# Patient Record
Sex: Male | Born: 1955 | Race: White | Hispanic: No | Marital: Single | Smoking: Current every day smoker
Health system: Southern US, Community
[De-identification: ages and names within clinical notes are randomized; demographics above are authoritative.]

## PROBLEM LIST (undated history)

## (undated) DIAGNOSIS — I10 Essential (primary) hypertension: Secondary | ICD-10-CM

## (undated) DIAGNOSIS — E871 Hypo-osmolality and hyponatremia: Secondary | ICD-10-CM

## (undated) DIAGNOSIS — I480 Paroxysmal atrial fibrillation: Secondary | ICD-10-CM

## (undated) HISTORY — PX: EYE SURGERY: SHX253

---

## 2007-04-20 ENCOUNTER — Emergency Department: Payer: Self-pay | Admitting: Emergency Medicine

## 2007-11-23 ENCOUNTER — Emergency Department: Payer: Self-pay | Admitting: Emergency Medicine

## 2010-06-24 ENCOUNTER — Ambulatory Visit: Payer: Self-pay | Admitting: "Endocrinology

## 2012-01-10 ENCOUNTER — Emergency Department: Payer: Self-pay | Admitting: Emergency Medicine

## 2012-01-10 LAB — TSH: Thyroid Stimulating Horm: 0.496 u[IU]/mL

## 2012-01-10 LAB — URINALYSIS, COMPLETE
Bacteria: NONE SEEN
Bilirubin,UR: NEGATIVE
Blood: NEGATIVE
Glucose,UR: NEGATIVE mg/dL (ref 0–75)
Ketone: NEGATIVE
Leukocyte Esterase: NEGATIVE
Nitrite: NEGATIVE
Ph: 7 (ref 4.5–8.0)
Protein: NEGATIVE
RBC,UR: NONE SEEN /HPF (ref 0–5)
Specific Gravity: 1.008 (ref 1.003–1.030)

## 2012-01-10 LAB — COMPREHENSIVE METABOLIC PANEL
Albumin: 3.8 g/dL (ref 3.4–5.0)
Alkaline Phosphatase: 98 U/L (ref 50–136)
Calcium, Total: 9.1 mg/dL (ref 8.5–10.1)
Co2: 28 mmol/L (ref 21–32)
Creatinine: 0.63 mg/dL (ref 0.60–1.30)
EGFR (Non-African Amer.): >60
Glucose: 91 mg/dL (ref 65–99)
Potassium: 4.3 mmol/L (ref 3.5–5.1)
SGPT (ALT): 65 U/L (ref 12–78)

## 2012-01-10 LAB — CBC
HCT: 41.1 % (ref 40.0–52.0)
HGB: 14.2 g/dL (ref 13.0–18.0)
MCHC: 34.6 g/dL (ref 32.0–36.0)
RBC: 4.25 10*6/uL — ABNORMAL LOW (ref 4.40–5.90)
WBC: 6.7 10*3/uL (ref 3.8–10.6)

## 2012-01-10 LAB — DRUG SCREEN, URINE
Barbiturates, Ur Screen: NEGATIVE (ref ?–200)
Cannabinoid 50 Ng, Ur ~~LOC~~: NEGATIVE (ref ?–50)
Cocaine Metabolite,Ur ~~LOC~~: NEGATIVE (ref ?–300)
Phencyclidine (PCP) Ur S: NEGATIVE (ref ?–25)

## 2012-01-10 LAB — ETHANOL
Ethanol %: 0.072 % (ref 0.000–0.080)
Ethanol: 72 mg/dL

## 2013-03-24 ENCOUNTER — Emergency Department: Payer: Self-pay | Admitting: Emergency Medicine

## 2013-03-24 LAB — CBC
HCT: 45.9 % (ref 40.0–52.0)
HGB: 14.9 g/dL (ref 13.0–18.0)
MCHC: 32.5 g/dL (ref 32.0–36.0)
MCV: 91 fL (ref 80–100)
Platelet: 243 10*3/uL (ref 150–440)
RBC: 5.06 10*6/uL (ref 4.40–5.90)
RDW: 13.8 % (ref 11.5–14.5)
WBC: 11.7 10*3/uL — ABNORMAL HIGH (ref 3.8–10.6)

## 2013-03-24 LAB — TSH: Thyroid Stimulating Horm: 0.76 u[IU]/mL

## 2013-03-24 LAB — DRUG SCREEN, URINE
Amphetamines, Ur Screen: NEGATIVE (ref ?–1000)
Benzodiazepine, Ur Scrn: NEGATIVE (ref ?–200)
Cannabinoid 50 Ng, Ur ~~LOC~~: NEGATIVE (ref ?–50)
Cocaine Metabolite,Ur ~~LOC~~: NEGATIVE (ref ?–300)
Tricyclic, Ur Screen: NEGATIVE (ref ?–1000)

## 2013-03-24 LAB — COMPREHENSIVE METABOLIC PANEL
Albumin: 3.6 g/dL (ref 3.4–5.0)
Anion Gap: 8 (ref 7–16)
Bilirubin,Total: 0.2 mg/dL (ref 0.2–1.0)
Chloride: 105 mmol/L (ref 98–107)
Co2: 23 mmol/L (ref 21–32)
Creatinine: 0.75 mg/dL (ref 0.60–1.30)
EGFR (African American): >60
Glucose: 109 mg/dL — ABNORMAL HIGH (ref 65–99)
Osmolality: 273 (ref 275–301)
Potassium: 4.1 mmol/L (ref 3.5–5.1)
SGOT(AST): 26 U/L (ref 15–37)
SGPT (ALT): 19 U/L (ref 12–78)
Total Protein: 8.2 g/dL (ref 6.4–8.2)

## 2013-03-24 LAB — SALICYLATE LEVEL: Salicylates, Serum: 2.5 mg/dL

## 2013-03-24 LAB — ACETAMINOPHEN LEVEL: Acetaminophen: <2 ug/mL

## 2015-04-01 ENCOUNTER — Encounter: Payer: Self-pay | Admitting: Emergency Medicine

## 2015-04-01 ENCOUNTER — Emergency Department
Admission: EM | Admit: 2015-04-01 | Discharge: 2015-04-01 | Disposition: A | Discharge status: Home / Self Care | Payer: Medicaid Other | Attending: Emergency Medicine | Admitting: Emergency Medicine

## 2015-04-01 DIAGNOSIS — S90561A Insect bite (nonvenomous), right ankle, initial encounter: Secondary | ICD-10-CM | POA: Diagnosis not present

## 2015-04-01 DIAGNOSIS — S40862A Insect bite (nonvenomous) of left upper arm, initial encounter: Secondary | ICD-10-CM | POA: Diagnosis not present

## 2015-04-01 DIAGNOSIS — W57XXXA Bitten or stung by nonvenomous insect and other nonvenomous arthropods, initial encounter: Secondary | ICD-10-CM | POA: Diagnosis not present

## 2015-04-01 DIAGNOSIS — R21 Rash and other nonspecific skin eruption: Secondary | ICD-10-CM | POA: Insufficient documentation

## 2015-04-01 DIAGNOSIS — Y9289 Other specified places as the place of occurrence of the external cause: Secondary | ICD-10-CM | POA: Insufficient documentation

## 2015-04-01 DIAGNOSIS — F17210 Nicotine dependence, cigarettes, uncomplicated: Secondary | ICD-10-CM | POA: Diagnosis not present

## 2015-04-01 DIAGNOSIS — Y9389 Activity, other specified: Secondary | ICD-10-CM | POA: Diagnosis not present

## 2015-04-01 DIAGNOSIS — S80862A Insect bite (nonvenomous), left lower leg, initial encounter: Secondary | ICD-10-CM | POA: Insufficient documentation

## 2015-04-01 DIAGNOSIS — S20369A Insect bite (nonvenomous) of unspecified front wall of thorax, initial encounter: Secondary | ICD-10-CM | POA: Insufficient documentation

## 2015-04-01 DIAGNOSIS — Y998 Other external cause status: Secondary | ICD-10-CM | POA: Insufficient documentation

## 2015-04-01 DIAGNOSIS — S80861A Insect bite (nonvenomous), right lower leg, initial encounter: Secondary | ICD-10-CM | POA: Insufficient documentation

## 2015-04-01 DIAGNOSIS — S90562A Insect bite (nonvenomous), left ankle, initial encounter: Secondary | ICD-10-CM | POA: Insufficient documentation

## 2015-04-01 MED ORDER — HYDROXYZINE HCL 25 MG PO TABS: 25.0000 mg | ORAL_TABLET | Freq: Three times a day (TID) | ORAL | Status: DC | PRN

## 2015-04-01 MED ORDER — PREDNISONE 10 MG PO TABS: ORAL_TABLET | ORAL | Status: DC

## 2015-04-01 NOTE — ED Provider Notes (Signed)
[facility] Emergency Department Provider Note ____________________________________________  Time seen: Approximately 11:37 AM  I have reviewed the triage vital signs and the nursing notes.   HISTORY  Chief Complaint Rash   HPI Barry Fowler is a 60 y.o. male is here with complaint of rash that began approximately 2 weeks ago. Patient states that rash is on his arms, chest, going and around his ankles. Patient states that he knows someone who has bedbugs has also been noticing bugs in his apartment that he is spraying with bug spray. Rash isitching. Patient is not taking any over-the-counter medicines for this. While sitting on stretcher there was a bug that was captured by the nurse and identified as a bedbug.   History reviewed. No pertinent past medical history.  There are no active problems to display for this patient.   History reviewed. No pertinent past surgical history.  Current Outpatient Rx  Name  Route  Sig  Dispense  Refill  . hydrOXYzine (ATARAX/VISTARIL) 25 MG tablet   Oral   Take 1 tablet (25 mg total) by mouth 3 (three) times daily as needed.   30 tablet   0   . predniSONE (DELTASONE) 10 MG tablet      Take 6 tablets  today, on day 2 take 5 tablets, day 3 take 4 tablets, day 4 take 3 tablets, day 5 take  2 tablets and 1 tablet the last day   21 tablet   0     Allergies Review of patient's allergies indicates no known allergies.  No family history on file.  Social History Social History  Substance Use Topics  . Smoking status: Current Every Day Smoker -- 1.00 packs/day    Types: Cigarettes  . Smokeless tobacco: None  . Alcohol Use: Yes     Comment: beer daily    Review of Systems Constitutional: No fever/chills ENT: No sore throat. Cardiovascular: Denies chest pain. Respiratory: Denies shortness of breath. Gastrointestinal: No abdominal pain.  No nausea, no vomiting.   Musculoskeletal: Negative for back pain. Skin:  Positive for rash. Neurological: Negative for headaches, focal weakness or numbness.  10-point ROS otherwise negative.  ____________________________________________   PHYSICAL EXAM:  VITAL SIGNS: ED Triage Vitals  Enc Vitals Group     BP 04/01/15 1120 132/90 mmHg     Pulse Rate 04/01/15 1120 106     Resp 04/01/15 1120 18     Temp 04/01/15 1120 98 F (36.7 C)     Temp Source 04/01/15 1120 Oral     SpO2 04/01/15 1120 94 %     Weight 04/01/15 1120 152 lb (68.947 kg)     Height 04/01/15 1120 6' (1.829 m)     Head Cir --      Peak Flow --      Pain Score --      Pain Loc --      Pain Edu? --      Excl. in GC? --     Constitutional: Alert and oriented. Well appearing and in no acute distress. Eyes: Conjunctivae are normal. PERRL. EOMI. Head: Atraumatic. Nose: No congestion/rhinnorhea. Neck: No stridor.   Cardiovascular: Normal rate, regular rhythm. Grossly normal heart sounds.  Good peripheral circulation. Respiratory: Normal respiratory effort.  No retractions. Lungs CTAB. Gastrointestinal: Soft and nontender. No distention.  Musculoskeletal: Moves upper and lower extremities without any difficulty. Normal gait was noted. Neurologic:  Normal speech and language. No gross focal neurologic deficits are appreciated. No gait instability. Skin:  Skin is warm, dry and intact. Individual red minute papules diffuse over the arms, chest and lower extremities. No pattern distribution. No weeping or warmth. Psychiatric: Mood and affect are normal. Speech and behavior are normal.  ____________________________________________   LABS (all labs ordered are listed, but only abnormal results are displayed)  Labs Reviewed - No data to display    PROCEDURES  Procedure(s) performed: None  Critical Care performed: No  ____________________________________________   INITIAL IMPRESSION / ASSESSMENT AND PLAN / ED COURSE  Pertinent labs & imaging results that were available during my  care of the patient were reviewed by me and considered in my medical decision making (see chart for details).  Patient was informed this most likely could be a bedbug and was prescribed Atarax 25 mg 3 times a day for itching and prednisone 60 mg 6 day taper. Patient is follow-up with [facility] if any continued problems. ____________________________________________   FINAL CLINICAL IMPRESSION(S) / ED DIAGNOSES  Final diagnoses:  Bedbug bite  Rash and nonspecific skin eruption      Tommi Rumps, PA-C 04/01/15 1323  Jeanmarie Plant, MD 04/01/15 1444

## 2015-04-01 NOTE — ED Notes (Signed)
Pt c/o rash that began 2 weeks ago.  Rash is across pts arm, chest and groin area.  This RN found insect on pts body.  Insect capture and shown to PA.  Charge RN informed.

## 2015-04-01 NOTE — ED Notes (Signed)
Rash with itching to arms, ankles, and stomach for about 2 weeks now.  Denies known allergies.

## 2015-04-01 NOTE — Discharge Instructions (Signed)
Follow-up with Dr. Gwen PoundsKowalski if any continued problems. Atarax for itching and begin prednisone Dosepak today. Call professional exterminator to get rid of your bedbugs.

## 2015-05-04 ENCOUNTER — Encounter: Payer: Self-pay | Admitting: Emergency Medicine

## 2015-05-04 ENCOUNTER — Emergency Department
Admission: EM | Admit: 2015-05-04 | Discharge: 2015-05-04 | Disposition: A | Discharge status: Home / Self Care | Payer: Medicaid Other | Attending: Emergency Medicine | Admitting: Emergency Medicine

## 2015-05-04 DIAGNOSIS — R21 Rash and other nonspecific skin eruption: Secondary | ICD-10-CM | POA: Diagnosis present

## 2015-05-04 DIAGNOSIS — B86 Scabies: Secondary | ICD-10-CM | POA: Diagnosis not present

## 2015-05-04 DIAGNOSIS — F17210 Nicotine dependence, cigarettes, uncomplicated: Secondary | ICD-10-CM | POA: Diagnosis not present

## 2015-05-04 MED ORDER — PERMETHRIN 5 % EX CREA: TOPICAL_CREAM | CUTANEOUS | Status: DC

## 2015-05-04 NOTE — ED Notes (Signed)
Pt verbalizes understanding of Discharge instructions  

## 2015-05-04 NOTE — Discharge Instructions (Signed)

## 2015-05-04 NOTE — ED Notes (Signed)
Pt presents to the ER from home with complaints of rash to skin, reports about 2 weeks he was diagnosed with bed bugs, reports rash persists needs medication, pt reports he tried to do extermination at home by himself. Pt talks in complete sentences reports he drank this morning couple of 40's(beer)pt's friend brought pt to ER

## 2015-05-04 NOTE — ED Provider Notes (Signed)
CSN: [id number]     Arrival date & time 05/04/15  1420 History   First MD Initiated Contact with Patient 05/04/15 1444     Chief Complaint  Patient presents with  . Rash     (Consider location/radiation/quality/duration/timing/severity/associated sxs/prior Treatment) HPI  60 year old male presents to the emergency department for evaluation of skin rash. He has a rash on his hands, forearms, abdomen and back. Patient recently moved into an apartment. Was diagnosed with bedbugs couple weeks ago. Patient attempted extermination by himself in his apartment. Patient describes small bumps on his hands in between the digits. No chest pain, shortness of breath, wheezing. No headaches or fevers. He is not taking anything for the rash.   History reviewed. No pertinent past medical history. History reviewed. No pertinent past surgical history. No family history on file. Social History  Substance Use Topics  . Smoking status: Current Every Day Smoker -- 1.00 packs/day    Types: Cigarettes  . Smokeless tobacco: None  . Alcohol Use: Yes     Comment: beer daily    Review of Systems  Constitutional: Negative.  Negative for fever, chills, activity change and appetite change.  HENT: Negative for congestion, ear pain, mouth sores, rhinorrhea, sinus pressure, sore throat and trouble swallowing.   Eyes: Negative for photophobia, pain and discharge.  Respiratory: Negative for cough, chest tightness and shortness of breath.   Cardiovascular: Negative for chest pain and leg swelling.  Gastrointestinal: Negative for nausea, vomiting, abdominal pain, diarrhea and abdominal distention.  Genitourinary: Negative for dysuria and difficulty urinating.  Musculoskeletal: Negative for back pain, arthralgias and gait problem.  Skin: Positive for rash. Negative for color change.  Neurological: Negative for dizziness and headaches.  Hematological: Negative for adenopathy.  Psychiatric/Behavioral: Negative for  behavioral problems and agitation.      Allergies  Review of patient's allergies indicates no known allergies.  Home Medications   Prior to Admission medications   Medication Sig Start Date End Date Taking? Authorizing Provider  hydrOXYzine (ATARAX/VISTARIL) 25 MG tablet Take 1 tablet (25 mg total) by mouth 3 (three) times daily as needed. 04/01/15   Tommi Rumps, PA-C  permethrin (ELIMITE) 5 % cream Apply from chin down to the feet, weight 10 hours, rinse. 05/04/15 05/03/16  Evon Slack, PA-C  predniSONE (DELTASONE) 10 MG tablet Take 6 tablets  today, on day 2 take 5 tablets, day 3 take 4 tablets, day 4 take 3 tablets, day 5 take  2 tablets and 1 tablet the last day 04/01/15   Tommi Rumps, PA-C   BP 90/60 mmHg  Pulse 118  Temp(Src) 97.9 F (36.6 C) (Oral)  Resp 2  Ht 6' (1.829 m)  Wt 70.308 kg  BMI 21.02 kg/m2  SpO2 98% Physical Exam  Constitutional: He is oriented to person, place, and time. He appears well-developed and well-nourished.  HENT:  Head: Normocephalic and atraumatic.  Eyes: Conjunctivae and EOM are normal. Pupils are equal, round, and reactive to light.  Neck: Normal range of motion. Neck supple.  Cardiovascular: Normal rate, regular rhythm, normal heart sounds and intact distal pulses.   Pulmonary/Chest: Effort normal and breath sounds normal. No respiratory distress. He has no wheezes. He has no rales. He exhibits no tenderness.  Abdominal: Soft. Bowel sounds are normal. He exhibits no distension. There is no tenderness.  Musculoskeletal: Normal range of motion. He exhibits no edema or tenderness.  Neurological: He is alert and oriented to person, place, and time.  Skin: Skin is  warm and dry.  Diffuse small papules along the dorsum of the hands, interdigital areas of the digits, forearms abdomen and back. There are small areas of burrowing along the papules with a centralized black dot. There is no vesicular lesions. Minimal surrounding erythema. No  fluctuance.  Psychiatric: He has a normal mood and affect. His behavior is normal. Judgment and thought content normal.    ED Course  Procedures (including critical care time) Labs Review Labs Reviewed - No data to display  Imaging Review No results found. I have personally reviewed and evaluated these images and lab results as part of my medical decision-making.   EKG Interpretation None      MDM   Final diagnoses:  Scabies   60 year old male with scabies. He is given a prescription for permethrin. Educated on how to use permethrin as well as to eradicate scabies from his home. Patient will continue Benadryl as needed for itching, he understands that he will its for several weeks after using permethrin cream.    Evon Slack, PA-C 05/04/15 1458  Jene Every, MD 05/04/15 [id number]

## 2015-05-12 ENCOUNTER — Emergency Department
Admission: EM | Admit: 2015-05-12 | Discharge: 2015-05-12 | Disposition: A | Discharge status: Home / Self Care | Payer: Medicaid Other | Attending: Emergency Medicine | Admitting: Emergency Medicine

## 2015-05-12 ENCOUNTER — Encounter: Payer: Self-pay | Admitting: *Deleted

## 2015-05-12 DIAGNOSIS — F17210 Nicotine dependence, cigarettes, uncomplicated: Secondary | ICD-10-CM | POA: Diagnosis not present

## 2015-05-12 DIAGNOSIS — B86 Scabies: Secondary | ICD-10-CM | POA: Insufficient documentation

## 2015-05-12 DIAGNOSIS — R21 Rash and other nonspecific skin eruption: Secondary | ICD-10-CM | POA: Diagnosis present

## 2015-05-12 MED ORDER — PREDNISONE 10 MG PO TABS: ORAL_TABLET | ORAL | Status: DC

## 2015-05-12 MED ORDER — PERMETHRIN 5 % EX CREA: TOPICAL_CREAM | CUTANEOUS | Status: DC

## 2015-05-12 MED ORDER — HYDROXYZINE HCL 25 MG PO TABS: 25.0000 mg | ORAL_TABLET | Freq: Three times a day (TID) | ORAL | Status: DC | PRN

## 2015-05-12 NOTE — Discharge Instructions (Signed)

## 2015-05-12 NOTE — ED Notes (Signed)
Pt in via triage; pt reports rash x 1 month.  Pt states he was seen here and given a cream to use but states no relief.  Rash visible to BLE and trunk.

## 2015-05-12 NOTE — ED Notes (Signed)
States rash on arms, stomach and back for 1 month, states he was given some  Cream here at the ER but with no relief, states itching

## 2015-05-12 NOTE — ED Provider Notes (Signed)
[facility] Emergency Department Provider Note  ____________________________________________  Time seen: Approximately 3:06 PM  I have reviewed the triage vital signs and the nursing notes.   HISTORY  Chief Complaint Rash    HPI Barry Fowler is a 60 y.o. male patient complaining of continued itching at the treatment for scabies. Patient was seen on 05/04/2015 and prescribed Elimite, Atarax,and medrol dosepak. Patient states no relief from the itching. He denies any pain with this complaint. No other palliative measures except for the prescribed medicines above. Patient was very vague on uses of the medication previously prescribed.  History reviewed. No pertinent past medical history.  There are no active problems to display for this patient.   History reviewed. No pertinent past surgical history.  Current Outpatient Rx  Name  Route  Sig  Dispense  Refill  . hydrOXYzine (ATARAX/VISTARIL) 25 MG tablet   Oral   Take 1 tablet (25 mg total) by mouth 3 (three) times daily as needed.   30 tablet   0   . permethrin (ELIMITE) 5 % cream      Apply from chin down to the feet, weight 10 hours, rinse.   60 g   1   . predniSONE (DELTASONE) 10 MG tablet      Take 6 tablets  today, on day 2 take 5 tablets, day 3 take 4 tablets, day 4 take 3 tablets, day 5 take  2 tablets and 1 tablet the last day   21 tablet   0     Allergies Review of patient's allergies indicates no known allergies.  History reviewed. No pertinent family history.  Social History Social History  Substance Use Topics  . Smoking status: Current Every Day Smoker -- 1.00 packs/day    Types: Cigarettes  . Smokeless tobacco: None  . Alcohol Use: Yes     Comment: beer daily    Review of Systems Constitutional: No fever/chills Eyes: No visual changes. ENT: No sore throat. Cardiovascular: Denies chest pain. Respiratory: Denies shortness of breath. Gastrointestinal: No abdominal  pain.  No nausea, no vomiting.  No diarrhea.  No constipation. Genitourinary: Negative for dysuria. Musculoskeletal: Negative for back pain. Skin: Negative for rash. Neurological: Negative for headaches, focal weakness or numbness. 10-point ROS otherwise negative.  ____________________________________________   PHYSICAL EXAM:  VITAL SIGNS: ED Triage Vitals  Enc Vitals Group     BP 05/12/15 1342 172/109 mmHg     Pulse Rate 05/12/15 1342 106     Resp 05/12/15 1342 18     Temp 05/12/15 1342 98.5 F (36.9 C)     Temp Source 05/12/15 1342 Oral     SpO2 05/12/15 1342 94 %     Weight 05/12/15 1342 170 lb (77.111 kg)     Height 05/12/15 1342 6' (1.829 m)     Head Cir --      Peak Flow --      Pain Score --      Pain Loc --      Pain Edu? --      Excl. in GC? --     Constitutional: Alert and oriented. Well appearing and in no acute distress. Eyes: Conjunctivae are normal. PERRL. EOMI. Head: Atraumatic. Nose: No congestion/rhinnorhea. Mouth/Throat: Mucous membranes are moist.  Oropharynx non-erythematous. Neck: No stridor.  No cervical spine tenderness to palpation. Hematological/Lymphatic/Immunilogical: No cervical lymphadenopathy. Cardiovascular: Normal rate, regular rhythm. Grossly normal heart sounds.  Good peripheral circulation. Respiratory: Normal respiratory effort.  No retractions. Lungs CTAB.  Gastrointestinal: Soft and nontender. No distention. No abdominal bruits. No CVA tenderness. Musculoskeletal: No lower extremity tenderness nor edema.  No joint effusions. Neurologic:  Normal speech and language. No gross focal neurologic deficits are appreciated. No gait instability. Skin:  Skin is warm, dry and intact. Diffuse papular lesions with his fingers forearm abdomen and back. No signs symptoms secondary infection.  Psychiatric: Mood and affect are normal. Speech and behavior are normal.  ____________________________________________   LABS (all labs ordered are listed,  but only abnormal results are displayed)  Labs Reviewed - No data to display ____________________________________________  EKG   ____________________________________________  RADIOLOGY   ____________________________________________   PROCEDURES  Procedure(s) performed: None  Critical Care performed: No  ____________________________________________   INITIAL IMPRESSION / ASSESSMENT AND PLAN / ED COURSE  Pertinent labs & imaging results that were available during my care of the patient were reviewed by me and considered in my medical decision making (see chart for details).  Scabies. Patient given detail information or medication usage. Patient will restart Elimite, Atarax, and prednisone. [facility]. ____________________________________________   FINAL CLINICAL IMPRESSION(S) / ED DIAGNOSES  Final diagnoses:  Scabies      Joni Reining, PA-C 05/12/15 1513  Rockne Menghini, MD 05/12/15 1529

## 2019-01-17 ENCOUNTER — Other Ambulatory Visit (HOSPITAL_COMMUNITY): Payer: Self-pay | Admitting: Ophthalmology

## 2019-01-17 ENCOUNTER — Other Ambulatory Visit: Payer: Self-pay | Admitting: Ophthalmology

## 2019-01-17 DIAGNOSIS — H5509 Other forms of nystagmus: Secondary | ICD-10-CM

## 2019-01-19 ENCOUNTER — Other Ambulatory Visit: Payer: Self-pay

## 2019-01-19 ENCOUNTER — Ambulatory Visit
Admission: RE | Admit: 2019-01-19 | Discharge: 2019-01-19 | Disposition: A | Discharge status: Home / Self Care | Payer: Medicare Other | Source: Ambulatory Visit | Attending: Ophthalmology | Admitting: Ophthalmology

## 2019-01-19 DIAGNOSIS — H5509 Other forms of nystagmus: Secondary | ICD-10-CM | POA: Diagnosis present

## 2019-01-19 LAB — POCT I-STAT CREATININE: Creatinine, Ser: 0.9 mg/dL (ref 0.61–1.24)

## 2019-01-19 MED ORDER — GADOBUTROL 1 MMOL/ML IV SOLN
7.5000 mL | Freq: Once | INTRAVENOUS | Status: AC | PRN
  Administered 2019-01-19: 7.5 mL via INTRAVENOUS

## 2019-05-29 ENCOUNTER — Telehealth: Payer: Self-pay | Admitting: *Deleted

## 2019-05-29 DIAGNOSIS — Z87891 Personal history of nicotine dependence: Secondary | ICD-10-CM

## 2019-05-29 NOTE — Telephone Encounter (Signed)
Received referral for initial lung cancer screening scan. Contacted patient and obtained smoking history,(current, 42 pack year) as well as answering questions related to screening process. Patient denies signs of lung cancer such as weight loss or hemoptysis. Patient denies comorbidity that would prevent curative treatment if lung cancer were found. Patient is scheduled for shared decision making visit and CT scan on 06/05/19 at 130pm.

## 2019-06-05 ENCOUNTER — Ambulatory Visit: Payer: Medicare Other | Attending: Nurse Practitioner

## 2019-06-05 ENCOUNTER — Ambulatory Visit: Payer: Medicare Other | Admitting: Nurse Practitioner

## 2019-06-05 ENCOUNTER — Inpatient Hospital Stay: Payer: Medicare Other | Admitting: Hospice and Palliative Medicine

## 2019-07-03 ENCOUNTER — Other Ambulatory Visit: Payer: Self-pay

## 2019-07-03 ENCOUNTER — Ambulatory Visit
Admission: RE | Admit: 2019-07-03 | Discharge: 2019-07-03 | Disposition: A | Discharge status: Home / Self Care | Payer: Medicare Other | Source: Ambulatory Visit | Attending: Nurse Practitioner | Admitting: Nurse Practitioner

## 2019-07-03 ENCOUNTER — Inpatient Hospital Stay: Payer: Medicare Other | Attending: Nurse Practitioner | Admitting: Oncology

## 2019-07-03 ENCOUNTER — Encounter: Payer: Self-pay | Admitting: Nurse Practitioner

## 2019-07-03 DIAGNOSIS — Z87891 Personal history of nicotine dependence: Secondary | ICD-10-CM

## 2019-07-03 NOTE — Progress Notes (Signed)
Virtual Visit via Video Note  I connected with Mr. Starace on 07/03/19 at  1:15 PM EDT by a video enabled telemedicine application and verified that I am speaking with the correct person using two identifiers.  Location: Patient: [facility] Provider: Office   I discussed the limitations of evaluation and management by telemedicine and the availability of in person appointments. The patient expressed understanding and agreed to proceed.  I discussed the assessment and treatment plan with the patient. The patient was provided an opportunity to ask questions and all were answered. The patient agreed with the plan and demonstrated an understanding of the instructions.   The patient was advised to call back or seek an in-person evaluation if the symptoms worsen or if the condition fails to improve as anticipated.   In accordance with CMS guidelines, patient has met eligibility criteria including age, absence of signs or symptoms of lung cancer.  Social History   Tobacco Use  . Smoking status: Current Every Day Smoker    Packs/day: 1.00    Years: 42.00    Pack years: 42.00    Types: Cigarettes  Substance Use Topics  . Alcohol use: Yes    Comment: beer daily  . Drug use: Not on file      A shared decision-making session was conducted prior to the performance of CT scan. This includes one or more decision aids, includes benefits and harms of screening, follow-up diagnostic testing, over-diagnosis, false positive rate, and total radiation exposure.   Counseling on the importance of adherence to annual lung cancer LDCT screening, impact of co-morbidities, and ability or willingness to undergo diagnosis and treatment is imperative for compliance of the program.   Counseling on the importance of continued smoking cessation for former smokers; the importance of smoking cessation for current smokers, and information about tobacco cessation interventions have been given to patient including [place] and [employer] quit [place] programs.   Written order for lung cancer screening with LDCT has been given to the patient and any and all questions have been answered to the best of my abilities.    Yearly follow up will be coordinated by Burgess Estelle, Thoracic Navigator.  I provided 15 minutes of face-to-face video visit time during this encounter, and > 50% was spent counseling as documented under my assessment & plan.   Jacquelin Hawking, NP

## 2019-07-04 ENCOUNTER — Telehealth: Payer: Self-pay | Admitting: *Deleted

## 2019-07-04 NOTE — Telephone Encounter (Signed)
Notified patient of LDCT lung cancer screening program results with recommendation for 12 month follow up imaging. Also notified of incidental findings noted below and is encouraged to discuss further with PCP who will receive a copy of this note and/or the CT report. Patient verbalizes understanding.   IMPRESSION: Lung-RADS 1, negative. Continue annual screening with low-dose chest CT without contrast in 12 months.  Aortic Atherosclerosis (ICD10-I70.0) and Emphysema (ICD10-J43.9). 

## 2020-01-11 ENCOUNTER — Encounter (HOSPITAL_COMMUNITY): Payer: Self-pay | Admitting: Emergency Medicine

## 2020-01-11 ENCOUNTER — Emergency Department (HOSPITAL_COMMUNITY): Payer: Medicare Other

## 2020-01-11 ENCOUNTER — Other Ambulatory Visit: Payer: Self-pay

## 2020-01-11 ENCOUNTER — Inpatient Hospital Stay (HOSPITAL_COMMUNITY)
Admission: EM | Admit: 2020-01-11 | Discharge: 2020-01-22 | DRG: 640 | Disposition: A | Discharge status: Skilled Nursing Facility | Payer: Medicare Other | Attending: Internal Medicine | Admitting: Internal Medicine

## 2020-01-11 ENCOUNTER — Inpatient Hospital Stay (HOSPITAL_COMMUNITY): Payer: Medicare Other

## 2020-01-11 DIAGNOSIS — K921 Melena: Secondary | ICD-10-CM | POA: Diagnosis present

## 2020-01-11 DIAGNOSIS — Z23 Encounter for immunization: Secondary | ICD-10-CM | POA: Diagnosis present

## 2020-01-11 DIAGNOSIS — R7989 Other specified abnormal findings of blood chemistry: Secondary | ICD-10-CM

## 2020-01-11 DIAGNOSIS — K760 Fatty (change of) liver, not elsewhere classified: Secondary | ICD-10-CM | POA: Diagnosis present

## 2020-01-11 DIAGNOSIS — H548 Legal blindness, as defined in USA: Secondary | ICD-10-CM | POA: Diagnosis present

## 2020-01-11 DIAGNOSIS — E871 Hypo-osmolality and hyponatremia: Principal | ICD-10-CM | POA: Diagnosis present

## 2020-01-11 DIAGNOSIS — E878 Other disorders of electrolyte and fluid balance, not elsewhere classified: Secondary | ICD-10-CM | POA: Diagnosis present

## 2020-01-11 DIAGNOSIS — G9341 Metabolic encephalopathy: Secondary | ICD-10-CM | POA: Diagnosis present

## 2020-01-11 DIAGNOSIS — Z20822 Contact with and (suspected) exposure to covid-19: Secondary | ICD-10-CM | POA: Diagnosis present

## 2020-01-11 DIAGNOSIS — B9620 Unspecified Escherichia coli [E. coli] as the cause of diseases classified elsewhere: Secondary | ICD-10-CM | POA: Diagnosis not present

## 2020-01-11 DIAGNOSIS — R945 Abnormal results of liver function studies: Secondary | ICD-10-CM | POA: Diagnosis not present

## 2020-01-11 DIAGNOSIS — E876 Hypokalemia: Secondary | ICD-10-CM | POA: Diagnosis not present

## 2020-01-11 DIAGNOSIS — E785 Hyperlipidemia, unspecified: Secondary | ICD-10-CM | POA: Diagnosis present

## 2020-01-11 DIAGNOSIS — F1010 Alcohol abuse, uncomplicated: Secondary | ICD-10-CM | POA: Diagnosis present

## 2020-01-11 DIAGNOSIS — F17210 Nicotine dependence, cigarettes, uncomplicated: Secondary | ICD-10-CM | POA: Diagnosis present

## 2020-01-11 DIAGNOSIS — N390 Urinary tract infection, site not specified: Secondary | ICD-10-CM | POA: Diagnosis not present

## 2020-01-11 DIAGNOSIS — K922 Gastrointestinal hemorrhage, unspecified: Secondary | ICD-10-CM | POA: Diagnosis not present

## 2020-01-11 DIAGNOSIS — I10 Essential (primary) hypertension: Secondary | ICD-10-CM | POA: Diagnosis present

## 2020-01-11 DIAGNOSIS — I959 Hypotension, unspecified: Secondary | ICD-10-CM | POA: Diagnosis not present

## 2020-01-11 DIAGNOSIS — R54 Age-related physical debility: Secondary | ICD-10-CM | POA: Diagnosis present

## 2020-01-11 HISTORY — DX: Essential (primary) hypertension: I10

## 2020-01-11 LAB — SODIUM, URINE, RANDOM: Sodium, Ur: 15 mmol/L

## 2020-01-11 LAB — URINALYSIS, ROUTINE W REFLEX MICROSCOPIC
Bacteria, UA: NONE SEEN
Bilirubin Urine: NEGATIVE
Glucose, UA: NEGATIVE mg/dL
Ketones, ur: 20 mg/dL — AB
Leukocytes,Ua: NEGATIVE
Nitrite: NEGATIVE
Protein, ur: NEGATIVE mg/dL
Specific Gravity, Urine: 1.011 (ref 1.005–1.030)
pH: 6 (ref 5.0–8.0)

## 2020-01-11 LAB — RAPID URINE DRUG SCREEN, HOSP PERFORMED
Amphetamines: NOT DETECTED
Barbiturates: NOT DETECTED
Benzodiazepines: NOT DETECTED
Cocaine: NOT DETECTED
Opiates: NOT DETECTED
Tetrahydrocannabinol: NOT DETECTED

## 2020-01-11 LAB — LACTIC ACID, PLASMA
Lactic Acid, Venous: 2.8 mmol/L (ref 0.5–1.9)
Lactic Acid, Venous: 1 mmol/L (ref 0.5–1.9)

## 2020-01-11 LAB — POC OCCULT BLOOD, ED: Fecal Occult Bld: POSITIVE — AB

## 2020-01-11 LAB — COMPREHENSIVE METABOLIC PANEL
ALT: 26 U/L (ref 0–44)
AST: 79 U/L — ABNORMAL HIGH (ref 15–41)
Albumin: 3.5 g/dL (ref 3.5–5.0)
Alkaline Phosphatase: 60 U/L (ref 38–126)
Anion gap: 11 (ref 5–15)
BUN: 8 mg/dL (ref 8–23)
CO2: 20 mmol/L — ABNORMAL LOW (ref 22–32)
Calcium: 8.2 mg/dL — ABNORMAL LOW (ref 8.9–10.3)
Chloride: 75 mmol/L — ABNORMAL LOW (ref 98–111)
Creatinine, Ser: 0.58 mg/dL — ABNORMAL LOW (ref 0.61–1.24)
GFR, Estimated: >60 mL/min (ref >60)
Glucose, Bld: 128 mg/dL — ABNORMAL HIGH (ref 70–99)
Potassium: 3.6 mmol/L (ref 3.5–5.1)
Sodium: 106 mmol/L — CL (ref 135–145)
Total Bilirubin: 1.2 mg/dL (ref 0.3–1.2)
Total Protein: 7 g/dL (ref 6.5–8.1)

## 2020-01-11 LAB — CBC WITH DIFFERENTIAL/PLATELET
Basophils Absolute: 0 10*3/uL (ref 0.0–0.1)
Basophils Relative: 0 %
Eosinophils Absolute: 0 10*3/uL (ref 0.0–0.5)
Eosinophils Relative: 0 %
Hemoglobin: 14.2 g/dL (ref 13.0–17.0)
Lymphocytes Relative: 4 %
Lymphs Abs: 0.6 10*3/uL — ABNORMAL LOW (ref 0.7–4.0)
Monocytes Absolute: 1.4 10*3/uL — ABNORMAL HIGH (ref 0.1–1.0)
Monocytes Relative: 10 %
Neutro Abs: 11.9 10*3/uL — ABNORMAL HIGH (ref 1.7–7.7)
Neutrophils Relative %: 86 %
Platelets: 220 10*3/uL (ref 150–400)
WBC: 13.8 10*3/uL — ABNORMAL HIGH (ref 4.0–10.5)

## 2020-01-11 LAB — AMMONIA: Ammonia: 24 umol/L (ref 9–35)

## 2020-01-11 LAB — BASIC METABOLIC PANEL
Anion gap: 12 (ref 5–15)
BUN: 7 mg/dL — ABNORMAL LOW (ref 8–23)
CO2: 21 mmol/L — ABNORMAL LOW (ref 22–32)
Calcium: 8.1 mg/dL — ABNORMAL LOW (ref 8.9–10.3)
Chloride: 76 mmol/L — ABNORMAL LOW (ref 98–111)
Creatinine, Ser: 0.54 mg/dL — ABNORMAL LOW (ref 0.61–1.24)
GFR, Estimated: >60 mL/min (ref >60)
Glucose, Bld: 99 mg/dL (ref 70–99)
Potassium: 3.5 mmol/L (ref 3.5–5.1)
Sodium: 109 mmol/L — CL (ref 135–145)

## 2020-01-11 LAB — OSMOLALITY: Osmolality: 225 mOsm/kg — CL (ref 275–295)

## 2020-01-11 LAB — TSH: TSH: 0.47 u[IU]/mL (ref 0.350–4.500)

## 2020-01-11 LAB — SODIUM
Sodium: 112 mmol/L — CL (ref 135–145)
Sodium: 113 mmol/L — CL (ref 135–145)
Sodium: 110 mmol/L — CL (ref 135–145)

## 2020-01-11 LAB — RESPIRATORY PANEL BY RT PCR (FLU A&B, COVID)
Influenza A by PCR: NEGATIVE
Influenza B by PCR: NEGATIVE
SARS Coronavirus 2 by RT PCR: NEGATIVE

## 2020-01-11 LAB — TROPONIN I (HIGH SENSITIVITY): Troponin I (High Sensitivity): 9 ng/L (ref <18)

## 2020-01-11 LAB — T4, FREE: Free T4: 1.07 ng/dL (ref 0.61–1.12)

## 2020-01-11 LAB — ETHANOL: Alcohol, Ethyl (B): <10 mg/dL (ref <10)

## 2020-01-11 MED ORDER — INFLUENZA VAC SPLIT QUAD 0.5 ML IM SUSY
0.5000 mL | PREFILLED_SYRINGE | INTRAMUSCULAR | Status: AC
  Administered 2020-01-12: 0.5 mL via INTRAMUSCULAR
  Filled 2020-01-11: qty 0.5

## 2020-01-11 MED ORDER — PNEUMOCOCCAL VAC POLYVALENT 25 MCG/0.5ML IJ INJ
0.5000 mL | INJECTION | INTRAMUSCULAR | Status: AC
  Administered 2020-01-12: 0.5 mL via INTRAMUSCULAR
  Filled 2020-01-11: qty 0.5

## 2020-01-11 MED ORDER — CLONIDINE HCL 0.1 MG PO TABS
0.1000 mg | ORAL_TABLET | Freq: Three times a day (TID) | ORAL | Status: DC
  Administered 2020-01-11 (×2): 0.1 mg via ORAL
  Filled 2020-01-11 (×2): qty 1

## 2020-01-11 MED ORDER — POTASSIUM CHLORIDE CRYS ER 20 MEQ PO TBCR
40.0000 meq | EXTENDED_RELEASE_TABLET | Freq: Once | ORAL | Status: AC
  Administered 2020-01-11: 40 meq via ORAL
  Filled 2020-01-11: qty 2

## 2020-01-11 MED ORDER — SODIUM CHLORIDE 0.9 % IV SOLN: INTRAVENOUS | Status: DC

## 2020-01-11 MED ORDER — THIAMINE HCL 100 MG/ML IJ SOLN
100.0000 mg | Freq: Every day | INTRAMUSCULAR | Status: DC
  Filled 2020-01-11: qty 2

## 2020-01-11 MED ORDER — SODIUM CHLORIDE 0.9 % IV SOLN
8.0000 mg/h | INTRAVENOUS | Status: DC
  Administered 2020-01-11 – 2020-01-12 (×2): 8 mg/h via INTRAVENOUS
  Filled 2020-01-11 (×7): qty 80

## 2020-01-11 MED ORDER — LORAZEPAM 2 MG/ML IJ SOLN
1.0000 mg | INTRAMUSCULAR | Status: AC | PRN
  Administered 2020-01-14: 4 mg via INTRAVENOUS
  Filled 2020-01-11: qty 2

## 2020-01-11 MED ORDER — SODIUM CHLORIDE 0.9 % IV BOLUS
1000.0000 mL | Freq: Once | INTRAVENOUS | Status: AC
  Administered 2020-01-11: 1000 mL via INTRAVENOUS

## 2020-01-11 MED ORDER — NICOTINE 21 MG/24HR TD PT24
21.0000 mg | MEDICATED_PATCH | Freq: Every day | TRANSDERMAL | Status: DC
  Administered 2020-01-11 – 2020-01-22 (×12): 21 mg via TRANSDERMAL
  Filled 2020-01-11 (×12): qty 1

## 2020-01-11 MED ORDER — LORAZEPAM 1 MG PO TABS: 1.0000 mg | ORAL_TABLET | ORAL | Status: AC | PRN

## 2020-01-11 MED ORDER — THIAMINE HCL 100 MG PO TABS
100.0000 mg | ORAL_TABLET | Freq: Every day | ORAL | Status: DC
  Administered 2020-01-11 – 2020-01-22 (×12): 100 mg via ORAL
  Filled 2020-01-11 (×12): qty 1

## 2020-01-11 MED ORDER — ADULT MULTIVITAMIN W/MINERALS CH
1.0000 | ORAL_TABLET | Freq: Every day | ORAL | Status: DC
  Administered 2020-01-11 – 2020-01-22 (×12): 1 via ORAL
  Filled 2020-01-11 (×12): qty 1

## 2020-01-11 MED ORDER — SODIUM CHLORIDE 0.9 % IV SOLN
80.0000 mg | Freq: Once | INTRAVENOUS | Status: AC
  Administered 2020-01-11: 80 mg via INTRAVENOUS
  Filled 2020-01-11: qty 80

## 2020-01-11 MED ORDER — GUAIFENESIN-DM 100-10 MG/5ML PO SYRP
10.0000 mL | ORAL_SOLUTION | ORAL | Status: DC | PRN
  Administered 2020-01-11 – 2020-01-12 (×2): 10 mL via ORAL
  Filled 2020-01-11 (×2): qty 10

## 2020-01-11 MED ORDER — FOLIC ACID 1 MG PO TABS
1.0000 mg | ORAL_TABLET | Freq: Every day | ORAL | Status: DC
  Administered 2020-01-11 – 2020-01-22 (×12): 1 mg via ORAL
  Filled 2020-01-11 (×12): qty 1

## 2020-01-11 MED ORDER — HEPARIN SODIUM (PORCINE) 5000 UNIT/ML IJ SOLN
5000.0000 [IU] | Freq: Three times a day (TID) | INTRAMUSCULAR | Status: DC
  Administered 2020-01-11 – 2020-01-22 (×30): 5000 [IU] via SUBCUTANEOUS
  Filled 2020-01-11 (×30): qty 1

## 2020-01-11 MED ORDER — PANTOPRAZOLE SODIUM 40 MG IV SOLR: 40.0000 mg | Freq: Two times a day (BID) | INTRAVENOUS | Status: DC

## 2020-01-11 MED ORDER — PANTOPRAZOLE SODIUM 40 MG IV SOLR
40.0000 mg | Freq: Two times a day (BID) | INTRAVENOUS | Status: DC
  Administered 2020-01-11: 40 mg via INTRAVENOUS
  Filled 2020-01-11: qty 40

## 2020-01-11 NOTE — ED Notes (Signed)
CRITICAL VALUE ALERT  Critical Value:  Na 112   Date & Time Notied:  01/11/20 1745  Provider Notified: Dr. Randol Kern   Orders Received/Actions taken: None  yet

## 2020-01-11 NOTE — ED Notes (Signed)
Date and time results received: 01/11/20 1311  Test: Lactic Acid Critical Value: 2.8  Name of Provider Notified:Dr. Long  Orders Received? Or Actions Taken?:No new orders given.

## 2020-01-11 NOTE — ED Notes (Signed)
Date and time results received: 01/11/20 1409   Test: Sodium Critical Value: 106  Name of Provider Notified: Dr. Jacqulyn Bath  Orders Received? Or Actions Taken?: No new orders given.

## 2020-01-11 NOTE — ED Provider Notes (Signed)
[facility] EMERGENCY DEPARTMENT Provider Note   CSN: [id number] Arrival date & time: 01/11/20  1043     History Chief Complaint  Patient presents with  . Weakness    Barry Fowler is a 64 y.o. male with PMH of hypertension, 30-pack-year smoking history, and hyperlipidemia who presents to the ED via EMS with concern for stroke.  Family reports a several month history of weakness, but EMS noted a left-sided facial droop.  Patient is reporting nausea and one episode of dark black stool x1.  On my examination, patient reports that he has been ataxic and falling for the past 6 months.  I asked him what prompted him to come to the ED today, he replied by stating that he felt as though it was time to finally get checked out.  No new changes in the past 24 hours.  On my exam, patient is fumbling with the remote controller to television and his eyes are swirling, intermittently rolling back.  He states that he lives at home with his nephew.  He denies any substance use disorder.  Last evening he had a dark black stool, but denies history of similar episodes.  He has never had a colonoscopy.  Patient also denies any chest pain or shortness of breath, headache, blurred vision, hearing change, abdominal pain, nausea or vomiting, or urinary symptoms.  HPI     Past Medical History:  Diagnosis Date  . Hypertension     There are no problems to display for this patient.   Past Surgical History:  Procedure Laterality Date  . EYE SURGERY         History reviewed. No pertinent family history.  Social History   Tobacco Use  . Smoking status: Current Every Day Smoker    Packs/day: 1.00    Years: 42.00    Pack years: 42.00    Types: Cigarettes  . Smokeless tobacco: Never Used  Substance Use Topics  . Alcohol use: Yes    Comment: beer daily  . Drug use: Not on file    Home Medications Prior to Admission medications   Medication Sig Start Date End Date Taking? Authorizing Provider   hydrOXYzine (ATARAX/VISTARIL) 25 MG tablet Take 1 tablet (25 mg total) by mouth 3 (three) times daily as needed. 05/12/15   Joni Reining, PA-C  permethrin (ELIMITE) 5 % cream Apply from chin down to the feet, weight 10 hours, rinse. 05/12/15 05/11/16  Joni Reining, PA-C  predniSONE (DELTASONE) 10 MG tablet Take 6 tablets  today, on day 2 take 5 tablets, day 3 take 4 tablets, day 4 take 3 tablets, day 5 take  2 tablets and 1 tablet the last day 05/12/15   Joni Reining, PA-C    Allergies    Patient has no known allergies.  Review of Systems   Review of Systems  All other systems reviewed and are negative.   Physical Exam Updated Vital Signs BP (!) 134/95   Pulse 86   Temp 97.7 F (36.5 C) (Oral)   Resp 16   Ht 6' (1.829 m)   Wt 79.4 kg   SpO2 94%   BMI 23.73 kg/m   Physical Exam Vitals and nursing note reviewed. Exam conducted with a chaperone present.  Constitutional:      General: He is not in acute distress.    Appearance: He is ill-appearing.  HENT:     Head: Normocephalic and atraumatic.  Eyes:     General: No scleral icterus.  Cardiovascular:     Rate and Rhythm: Normal rate and regular rhythm.     Pulses: Normal pulses.     Comments: Murmur noted. Pulmonary:     Effort: Pulmonary effort is normal. No respiratory distress.     Comments: No significant increased work of breathing.  Borderline tachypnea.  CTA bilaterally.  Musculoskeletal:     Cervical back: Normal range of motion. No rigidity.  Skin:    General: Skin is dry.     Capillary Refill: Capillary refill takes less than 2 seconds.  Neurological:     Mental Status: He is alert.     GCS: GCS eye subscore is 4. GCS verbal subscore is 5. GCS motor subscore is 6.     Comments: Abnormal cerebellar exam.  Can move all extremities with strength intact against resistance.  Sensation intact throughout.  Abnormal coordination.  No obvious asterixis.  Psychiatric:        Mood and Affect: Mood normal.         Behavior: Behavior normal.        Thought Content: Thought content normal.     ED Results / Procedures / Treatments   Labs (all labs ordered are listed, but only abnormal results are displayed) Labs Reviewed  CBC WITH DIFFERENTIAL/PLATELET - Abnormal; Notable for the following components:      Result Value   WBC 13.8 (*)    Neutro Abs 11.9 (*)    Lymphs Abs 0.6 (*)    Monocytes Absolute 1.4 (*)    All other components within normal limits  LACTIC ACID, PLASMA - Abnormal; Notable for the following components:   Lactic Acid, Venous 2.8 (*)    All other components within normal limits  COMPREHENSIVE METABOLIC PANEL - Abnormal; Notable for the following components:   Sodium 106 (*)    Chloride 75 (*)    CO2 20 (*)    Glucose, Bld 128 (*)    Creatinine, Ser 0.58 (*)    Calcium 8.2 (*)    AST 79 (*)    All other components within normal limits  URINALYSIS, ROUTINE W REFLEX MICROSCOPIC - Abnormal; Notable for the following components:   Hgb urine dipstick SMALL (*)    Ketones, ur 20 (*)    All other components within normal limits  RESPIRATORY PANEL BY RT PCR (FLU A&B, COVID)  CULTURE, BLOOD (ROUTINE X 2)  CULTURE, BLOOD (ROUTINE X 2)  LACTIC ACID, PLASMA  RAPID URINE DRUG SCREEN, HOSP PERFORMED  AMMONIA  ETHANOL  VITAMIN B1  BASIC METABOLIC PANEL  TROPONIN I (HIGH SENSITIVITY)    EKG None  Radiology CT Head Wo Contrast  Result Date: 01/11/2020 CLINICAL DATA:  Weakness.  Left facial droop. EXAM: CT HEAD WITHOUT CONTRAST TECHNIQUE: Contiguous axial images were obtained from the base of the skull through the vertex without intravenous contrast. COMPARISON:  MRI brain dated January 19, 2019. CT head dated March 21, 2018. FINDINGS: Brain: No evidence of acute infarction, hemorrhage, hydrocephalus, extra-axial collection or mass lesion/mass effect. Stable mild atrophy. Vascular: Calcified atherosclerosis at the skullbase. No hyperdense vessel. Skull: Normal. Negative for  fracture or focal lesion. Sinuses/Orbits: No acute finding. Both ocular lenses are posteriorly displaced, more so on the left. Other: None. IMPRESSION: 1. No acute intracranial abnormality. 2. 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 Electronically Signed   By: Obie DredgeWilliam T Derry M.D.   On: 01/11/2020 12:49    Procedures .Critical Care Performed by: Lorelee NewGreen, Lijah Bourque L, PA-C  Authorized by: Lorelee New, PA-C   Critical care provider statement:    Critical care time (minutes):  45   Critical care was necessary to treat or prevent imminent or life-threatening deterioration of the following conditions:  Metabolic crisis   Critical care was time spent personally by me on the following activities:  Discussions with consultants, evaluation of patient's response to treatment, examination of patient, ordering and performing treatments and interventions, ordering and review of laboratory studies, ordering and review of radiographic studies, pulse oximetry, re-evaluation of patient's condition, obtaining history from patient or surrogate and review of old charts Comments:     Hyponatremia to 106.    (including critical care time)  Medications Ordered in ED Medications  sodium chloride 0.9 % bolus 1,000 mL (1,000 mLs Intravenous New Bag/Given 01/11/20 1439)    ED Course  I have reviewed the triage vital signs and the nursing notes.  Pertinent labs & imaging results that were available during my care of the patient were reviewed by me and considered in my medical decision making (see chart for details).  Clinical Course as of Jan 11 1540  Fri Jan 11, 2020  1529 Spoke with critical care who states that hospitalist should be able to admit this patient to ICU.     [GG]  1540 Spoke with hospitalist who will see and admit patient.   [GG]    Clinical Course User Index [GG] Lorelee New, PA-C   MDM Rules/Calculators/A&P                          Obtained further  history from his sister who reports that he is legally blind, but has declined in the past week which prompted him to move in with his nephew.  Evidently he has been using his trash can as a toilet seat.  Sister notes that he is a very heavy alcoholic.  Patient admits to drinking 12-pack per day.    Labs CBC with differential: Mild leukocytosis to 13.8.  No anemia. Lactic acid: 1.0. Ammonia: 24. Ethanol: Less than 10. Vitamin B1: In process. Respiratory panel by PCR.  Negative. Troponin: None. Blood cultures: In process. UDS: Negative. UA: No evidence to suggest infection. CMP: Critically low hyponatremia to 106, normal on labs obtained 6 years ago.  Patient also has hypochloremia to 75.   Imaging CT head without contrast is personally reviewed and without any intracranial mass or other acute disease.  Suspect possible to be reported mania given his significant alcohol use.  Will await vitamin B1 level given concern for Warnicke's encephalopathy, however I suspect that his status and weakness is attributed to his profound hyponatremia to 106.  While patient had neuro deficits on initial examination, but he was unaware that he was legally blind and his sister states that abnormal cerebellar exam would be normal for him.  Do not feel as though CTA head neck or MRI emergently warranted.  We will consult critical care regarding his critically low hyponatremia.  Provided 1 L IV NS and have him on 100 cc/h NS infusion.  While he is weak, he is easily arousable and follows commands well.  No reported seizures or seizure history.  Spoke with critical care who states that hospitalist should be able to admit this patient to ICU.    Spoke with hospitalist who will see and admit patient.   Final Clinical Impression(s) / ED Diagnoses Final diagnoses:  Hyponatremia    Rx /  DC Orders ED Discharge Orders    None       Elvera Maria 01/11/20 1541    Long, Arlyss Repress, MD 01/14/20  [phone]

## 2020-01-11 NOTE — ED Triage Notes (Signed)
EMS called out for stroke. cbg 136. Pt and pt family reports weakness x3 months. EMS reports left sided facial droop unsure if new or timeline, black stool x1, nausea since last night.

## 2020-01-11 NOTE — ED Notes (Signed)
Pts. Sister at bedside. Pts. Sister states pt. Is legally blind in both eyes. She also states pt. Has been declining this last week. Pt. Has been urinating in trash cans at home. Pt. Has also been struggling to stand over the last week. Pt. Is also not drinking or eating adequately at home. Pts. Sister also states that the pt. Is a heavy drinker.

## 2020-01-11 NOTE — ED Notes (Signed)
Pt. Back from CT and resting.

## 2020-01-11 NOTE — ED Notes (Signed)
Date and time results received: 01/11/20 13:11 (use smartphrase ".now" to insert current time)  Test: Lactic Critical Value: 2.9  Name of Provider Notified: PA Green  Orders Received? Or Actions Taken?: N/A

## 2020-01-11 NOTE — H&P (Signed)
TR[facility] H&P   Patient Demographics:    Barry Fowler, is a 64 y.o. male  MRN: [id number]   DOB - 17-Mar-1956  Admit Date - 01/11/2020  Outpatient Primary MD for the patient is Patient, No Pcp Per  Referring MD/NP/PA: PA Garret  Patient coming from: Home  Chief Complaint  Patient presents with  . Weakness      HPI:    Barry Fowler  is a 64 y.o. male, with past medical history of hypertension, hyperlipidemia, tobacco abuse, 1 pack/day for last 30 years, sister at bedside assist with the history, patient was started last month on losartan for high blood pressure, patient with heavy alcohol abuse, up to 12 beers per day, patient presents to ED secondary couple weeks of generalized weakness, as well for last few days he had diminished appetite, and there was some questionable left-sided facial droop, as well as some nausea, and one episode of dark bowel movement, which prompted family to call EMS, patient lives with his nephew, patient denies any chest pain, denies any shortness of breath, headache, abdominal pain, vomiting, coffee-ground emesis. - in ED patient was noted to have sodium of 106, cultures pending, but hemoglobin is stable at 13.2, his potassium is 3.6, CT head with no acute findings, COVID-19 test has been negative, UA with no acute finding, negative urine drug screen, UA and chest x-ray is pending.    Review of systems:    In addition to the HPI above, No Fever-chills, poor appetite over last few days, generalized weakness and increased confusion. No Headache, No changes with  hearing, patient is blind  no problems swallowing food or Liquids, No Chest pain, Cough or Shortness of Breath, No Abdominal pain, No Nausea or Vommitting, Bowel movements are regular, No Blood in stool or Urine, No dysuria, No new skin rashes or bruises, No new joints pains-aches,  No new weakness,  tingling, numbness in any extremity, No recent weight gain or loss, No polyuria, polydypsia or polyphagia, No significant Mental Stressors.  He is with history of heavy alcohol abuse, up to 12 beers per day  A full 10 point Review of Systems was done, except as stated above, all other Review of Systems were negative.   With Past History of the following :    Past Medical History:  Diagnosis Date  . Hypertension       Past Surgical History:  Procedure Laterality Date  . EYE SURGERY        Social History:     Social History   Tobacco Use  . Smoking status: Current Every Day Smoker    Packs/day: 1.00    Years: 42.00    Pack years: 42.00    Types: Cigarettes  . Smokeless tobacco: Never Used  Substance Use Topics  . Alcohol use: Yes    Comment: beer daily     Family History :  No family history of lung malignancy, family history of diabetes in multiple family members   Home Medications:   Prior to Admission medications   Medication Sig Start Date End Date Taking? Authorizing Provider  hydrOXYzine (ATARAX/VISTARIL) 25 MG tablet Take 1 tablet (25 mg total) by mouth 3 (three) times daily as needed. 05/12/15   Joni Reining, PA-C  permethrin (ELIMITE) 5 % cream Apply from chin down to the feet, weight 10 hours, rinse. 05/12/15 05/11/16  Joni Reining, PA-C  predniSONE (DELTASONE) 10 MG tablet Take 6 tablets  today, on day 2 take 5 tablets, day 3 take 4 tablets, day 4 take 3 tablets, day 5 take  2 tablets and 1 tablet the last day 05/12/15   Joni Reining, PA-C   Patient currently on statin, losartan and meloxicam.   Allergies:    No Known Allergies   Physical Exam:   Vitals  Blood pressure (!) 134/95, pulse 86, temperature 97.7 F (36.5 C), temperature source Oral, resp. rate 16, height 6' (1.829 m), weight 79.4 kg, SpO2 94 %.   1. General frail, deconditioned male, laying in bed, in no apparent distress  2.  She is currently awake alert x3, but he is slow  to respond, with mild confusion .  3. No F.N deficits, ALL C.Nerves Intact, Strength 5/5 all 4 extremities, Sensation intact all 4 extremities, Plantars down going.  4. Ears  appear Normal, patient is legally blind. Moist Oral Mucosa.  5. Supple Neck, No JVD, No cervical lymphadenopathy appriciated, No Carotid Bruits.  6. Symmetrical Chest wall movement, Good air movement bilaterally, CTAB.  7. RRR, No Gallops, Rubs or Murmurs, No Parasternal Heave.  8. Positive Bowel Sounds, Abdomen Soft, No tenderness, No organomegaly appriciated,No rebound -guarding or rigidity.  9.  No Cyanosis, Normal Skin Turgor, No Skin Rash or Bruise.  10. Good muscle tone,  joints appear normal , no effusions, Normal ROM.  11. No Palpable Lymph Nodes in Neck or Axillae    Data Review:    CBC Recent Labs  Lab 01/11/20 1211  WBC 13.8*  HGB 14.2  HCT RESULTS UNAVAILABLE DUE TO INTERFERING SUBSTANCE  PLT 220  MCV RESULTS UNAVAILABLE DUE TO INTERFERING SUBSTANCE  MCH RESULTS UNAVAILABLE DUE TO INTERFERING SUBSTANCE  MCHC RESULTS UNAVAILABLE DUE TO INTERFERING SUBSTANCE  RDW RESULTS UNAVAILABLE DUE TO INTERFERING SUBSTANCE  LYMPHSABS 0.6*  MONOABS 1.4*  EOSABS 0.0  BASOSABS 0.0   ------------------------------------------------------------------------------------------------------------------  Chemistries  Recent Labs  Lab 01/11/20 1211  NA 106*  K 3.6  CL 75*  CO2 20*  GLUCOSE 128*  BUN 8  CREATININE 0.58*  CALCIUM 8.2*  AST 79*  ALT 26  ALKPHOS 60  BILITOT 1.2   ------------------------------------------------------------------------------------------------------------------ estimated creatinine clearance is 105.1 mL/min (A) (by C-G formula based on SCr of 0.58 mg/dL (L)). ------------------------------------------------------------------------------------------------------------------ No results for input(s): TSH, T4TOTAL, T3FREE, THYROIDAB in the last 72 hours.  Invalid input(s):  FREET3  Coagulation profile No results for input(s): INR, PROTIME in the last 168 hours. ------------------------------------------------------------------------------------------------------------------- No results for input(s): DDIMER in the last 72 hours. -------------------------------------------------------------------------------------------------------------------  Cardiac Enzymes No results for input(s): CKMB, TROPONINI, MYOGLOBIN in the last 168 hours.  Invalid input(s): CK ------------------------------------------------------------------------------------------------------------------ No results found for: BNP   ---------------------------------------------------------------------------------------------------------------  Urinalysis    Component Value Date/Time   COLORURINE YELLOW 01/11/2020 1211   APPEARANCEUR CLEAR 01/11/2020 1211   APPEARANCEUR Clear 01/10/2012 1248   LABSPEC 1.011 01/11/2020 1211   LABSPEC 1.008 01/10/2012 1248   PHURINE 6.0  01/11/2020 1211   GLUCOSEU NEGATIVE 01/11/2020 1211   GLUCOSEU Negative 01/10/2012 1248   HGBUR SMALL (A) 01/11/2020 1211   BILIRUBINUR NEGATIVE 01/11/2020 1211   BILIRUBINUR Negative 01/10/2012 1248   KETONESUR 20 (A) 01/11/2020 1211   PROTEINUR NEGATIVE 01/11/2020 1211   NITRITE NEGATIVE 01/11/2020 1211   LEUKOCYTESUR NEGATIVE 01/11/2020 1211   LEUKOCYTESUR Negative 01/10/2012 1248    ----------------------------------------------------------------------------------------------------------------   Imaging Results:    CT Head Wo Contrast  Result Date: 01/11/2020 CLINICAL DATA:  Weakness.  Left facial droop. EXAM: CT HEAD WITHOUT CONTRAST TECHNIQUE: Contiguous axial images were obtained from the base of the skull through the vertex without intravenous contrast. COMPARISON:  MRI brain dated January 19, 2019. CT head dated March 21, 2018. FINDINGS: Brain: No evidence of acute infarction, hemorrhage, hydrocephalus,  extra-axial collection or mass lesion/mass effect. Stable mild atrophy. Vascular: Calcified atherosclerosis at the skullbase. No hyperdense vessel. Skull: Normal. Negative for fracture or focal lesion. Sinuses/Orbits: No acute finding. Both ocular lenses are posteriorly displaced, more so on the left. Other: None. IMPRESSION: 1. No acute intracranial abnormality. 2. 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 Electronically Signed   By: Obie Dredge M.D.   On: 01/11/2020 12:49    EKG has not been ordered, will order 1 on admission.   Assessment & Plan:    Active Problems:   Hyponatremia   Alcohol abuse  Hyponatremia:  -This is most likely related to beer potomania, he drinks up to 12 cans of beer per day, as well he was recently started on losartan. -We will obtain urine serum, serum osmolality, and urine sodium. -Obtain DG chest x-ray to rule out any lung mass given he is a smoker -Cussed with renal, who will consult tomorrow, current recommendation is to continue with normal saline, and to avoid hypertonic 3% to avoid rapid correction, to be admitted to ICU and to monitor closely, they will see him tomorrow. -We will correct 8-10 M EQ per 24 hours. -We will monitor sodium every 2 hours initially. -We will obtain EKG  Acute metabolic encephalopathy -Due to above, no acute finding on CT head, improving.  Alcohol abuse -Patient will be started on stepdown alcohol withdrawal.  Hypertension -Off losartan, will start on clonidine, which should help with alcohol withdrawals as well.  Hyperlipidemia -We will resume statin when stable.  Dark-colored stools x1 -Patient report dark-colored stool x1, he is on Mobic, and he is with his alcohol consumption, ED will check Hemoccult, meanwhile I will start empirically on Protonix 40 mg IV twice daily, if Hemoccult is positive will change to Protonix drip and consult GI.  DVT Prophylaxis Heparin   AM Labs Ordered, also  please review Full Orders  Family Communication: Admission, patients condition and plan of care including tests being ordered have been discussed with the patient and sister who indicate understanding and agree with the plan and Code Status.  Code Status Full  Likely DC to  home  Condition GUARDED    Consults called: renal dr Hyman Hopes    Admission status: inpatient    Time spent in minutes : 60 minutes   Huey Bienenstock M.D on 01/11/2020 at 4:02 PM   [facility] - Office  [phone]

## 2020-01-11 NOTE — ED Notes (Addendum)
Entered pts. Room to collect blood. Pt. Was sitting on the edge of bed urinating. Pt. Stated " Give me a minute please." Stepped out of room and collected a couple of other items. Stepped back into pts room. Pt. Was sitting on the floor. Pt. Stated " I sat myself down." Pt. Was also attempting to lay themselves down on the floor. Pt. Was able to stand and get back into bed. Placed an external urinary catheter on pt. Assessed pts. Head. No bumps or abrasions present. Pt. Is alert and oriented x4. Physiological scientist and PA Evelena Leyden. Dr. Jacqulyn Bath also came and assessed pt. Left the room with both side rales up and call light within reach. Educated pt. To remain in bed and to push the button if they need assistants.  Pts. Door is left open as well. When room 18 opens up will move this pt. To room 18 for better visual of pt.

## 2020-01-12 DIAGNOSIS — F1010 Alcohol abuse, uncomplicated: Secondary | ICD-10-CM | POA: Diagnosis not present

## 2020-01-12 DIAGNOSIS — K922 Gastrointestinal hemorrhage, unspecified: Secondary | ICD-10-CM

## 2020-01-12 DIAGNOSIS — R945 Abnormal results of liver function studies: Secondary | ICD-10-CM | POA: Diagnosis not present

## 2020-01-12 DIAGNOSIS — K921 Melena: Secondary | ICD-10-CM | POA: Diagnosis not present

## 2020-01-12 DIAGNOSIS — E871 Hypo-osmolality and hyponatremia: Secondary | ICD-10-CM | POA: Diagnosis not present

## 2020-01-12 LAB — SODIUM
Sodium: 111 mmol/L — CL (ref 135–145)
Sodium: 112 mmol/L — CL (ref 135–145)
Sodium: 114 mmol/L — CL (ref 135–145)
Sodium: 113 mmol/L — CL (ref 135–145)

## 2020-01-12 LAB — CBC
HCT: 37.4 % — ABNORMAL LOW (ref 39.0–52.0)
Hemoglobin: 13.5 g/dL (ref 13.0–17.0)
MCH: 32.1 pg (ref 26.0–34.0)
MCHC: 36.1 g/dL — ABNORMAL HIGH (ref 30.0–36.0)
MCV: 88.8 fL (ref 80.0–100.0)
Platelets: 195 10*3/uL (ref 150–400)
RBC: 4.21 MIL/uL — ABNORMAL LOW (ref 4.22–5.81)
RDW: 12.1 % (ref 11.5–15.5)
WBC: 9.8 10*3/uL (ref 4.0–10.5)
nRBC: 0 % (ref 0.0–0.2)

## 2020-01-12 LAB — HIV ANTIBODY (ROUTINE TESTING W REFLEX): HIV Screen 4th Generation wRfx: NONREACTIVE

## 2020-01-12 LAB — CORTISOL: Cortisol, Plasma: 19.5 ug/dL

## 2020-01-12 LAB — OSMOLALITY, URINE: Osmolality, Ur: 211 mOsm/kg — ABNORMAL LOW (ref 300–900)

## 2020-01-12 LAB — MRSA PCR SCREENING: MRSA by PCR: NEGATIVE

## 2020-01-12 MED ORDER — CHLORHEXIDINE GLUCONATE CLOTH 2 % EX PADS
6.0000 | MEDICATED_PAD | Freq: Every day | CUTANEOUS | Status: DC
  Administered 2020-01-12 – 2020-01-20 (×9): 6 via TOPICAL

## 2020-01-12 MED ORDER — PANTOPRAZOLE SODIUM 40 MG IV SOLR
40.0000 mg | Freq: Two times a day (BID) | INTRAVENOUS | Status: DC
  Administered 2020-01-12 – 2020-01-20 (×17): 40 mg via INTRAVENOUS
  Filled 2020-01-12 (×17): qty 40

## 2020-01-12 NOTE — Consult Note (Addendum)
Reason for Consult: Hyponatremia Referring Physician: Dr. Geralynn Rile Barry Fowler is an 64 y.o. male.  HPI: Past medical history is pertinent for hypertension hyperlipidemia tobacco abuse 1 pack/day for 30 years.  History of heavy alcohol use 12 beers a day presents to the emergency room with generalized weakness.  In the emergency was discovered that he had a sodium of 106. Urine sodium 15.  I do not see a urine osmolality.   TSH 0.47 and normal renal function.  He is responded nicely to IV hydration.  With an appropriate rise in his sodium to 111.  Blood pressure 112/64 pulse 75 temperature 98.1 O2 sats 93%  Sodium 109 potassium 3.4 chloride 76 CO2 21 glucose 99 BUN 7 creatinine 0.54  Clonidine 0.1 mg 3 times daily folic acid 1 mg daily, multivitamins 1 daily Protonix 40 mg daily, thiamine 100 mg daily  IV sodium chloride 50 cc an hour   Trend in Creatinine: Creatinine  Date/Time Value Ref Range Status  03/24/2013 09:05 PM 0.75 0.60 - 1.30 mg/dL Final  67/20/9470 96:28 PM 0.63 0.60 - 1.30 mg/dL Final   Creatinine, Ser  Date/Time Value Ref Range Status  01/11/2020 03:33 PM 0.54 (L) 0.61 - 1.24 mg/dL Final  36/62/9476 54:65 PM 0.58 (L) 0.61 - 1.24 mg/dL Final  03/54/6568 12:75 PM 0.90 0.61 - 1.24 mg/dL Final   Sodium  Date/Time Value Ref Range Status  01/12/2020 12:32 AM 111 (LL) 135 - 145 mmol/L Final    Comment:    CRITICAL RESULT CALLED TO, READ BACK BY AND VERIFIED WITH: PENG,S @ 0131 ON 01/12/20 BY JUW Performed at [facility], [address], [place], [place] [address]   01/11/2020 08:12 PM 110 (LL) 135 - 145 mmol/L Final    Comment:    CRITICAL RESULT CALLED TO, READ BACK BY AND VERIFIED WITH: PINNIX,H ON 01/11/20 AT 2110 BY LOY,C Performed at [facility], [address] [place], [place] [address]   01/11/2020 06:17 PM 113 (LL) 135 - 145 mmol/L Final    Comment:    CRITICAL RESULT CALLED TO, READ BACK BY AND VERIFIED WITH: HARDIN,J ON 01/11/20 AT 1935 BY  LOY,C Performed at [facility], [address], [place], [place] [address]   01/11/2020 04:33 PM 112 (LL) 135 - 145 mmol/L Final    Comment:    CRITICAL RESULT CALLED TO, READ BACK BY AND VERIFIED WITH: WILEY,E ON 01/11/20 AT 1745 BY LOY,C Performed at [facility], [address], [place], [place] [address]   01/11/2020 03:33 PM 109 (LL) 135 - 145 mmol/L Final    Comment:    CRITICAL RESULT CALLED TO, READ BACK BY AND VERIFIED WITH: DR.ELGERGAWY,ON 01/11/20 AT 1645 BY LOY,C   01/11/2020 12:11 PM 106 (LL) 135 - 145 mmol/L Final    Comment:    CRITICAL RESULT CALLED TO, READ BACK BY AND VERIFIED WITH: JENNIFER @ 1408 ON [id number] BY HENDERSON L.   03/24/2013 09:05 PM 136 136 - 145 mmol/L Final  01/10/2012 12:48 PM 131 (L) 136 - 145 mmol/L Final   PMH:   Past Medical History:  Diagnosis Date  . Hypertension     PSH:   Past Surgical History:  Procedure Laterality Date  . EYE SURGERY      Allergies: No Known Allergies  Medications:   Prior to Admission medications   Medication Sig Start Date End Date Taking? Authorizing Provider  acetaminophen (TYLENOL) 500 MG tablet Take 500 mg by mouth every 6 (six) hours as needed.  Yes [provider]  losartan (COZAAR) 25 MG tablet Take 25 mg by mouth daily. 12/05/19  Yes [provider]  meloxicam (MOBIC) 15 MG tablet Take 15 mg by mouth daily. 12/05/19  Yes [provider]  hydrOXYzine (ATARAX/VISTARIL) 25 MG tablet Take 1 tablet (25 mg total) by mouth 3 (three) times daily as needed. Patient not taking: Reported on 01/11/2020 05/12/15   Joni Reining, PA-C  permethrin (ELIMITE) 5 % cream Apply from chin down to the feet, weight 10 hours, rinse. Patient not taking: Reported on 01/11/2020 05/12/15 05/11/16  Joni Reining, PA-C  predniSONE (DELTASONE) 10 MG tablet Take 6 tablets  today, on day 2 take 5 tablets, day 3 take 4 tablets, day 4 take 3 tablets, day 5 take  2 tablets and 1 tablet the last day Patient not  taking: Reported on 01/11/2020 05/12/15   Joni Reining, PA-C    Discontinued Meds:   Medications Discontinued During This Encounter  Medication Reason  . 0.9 %  sodium chloride infusion   . pantoprazole (PROTONIX) injection 40 mg     Social History:  reports that he has been smoking cigarettes. He has a 42.00 pack-year smoking history. He has never used smokeless tobacco. He reports current alcohol use. No history on file for drug use.  Family History:  History reviewed. No pertinent family history.  Review of systems General: Weakness and fatigue Eyes no visual complaints Ears nose mouth throat no hearing loss epistaxis or sore throat Cardiovascular no complaints chest pain no complaints of shortness of breath or ankle leg swelling Respiratory denies cough wheeze and obsess Abdominal: No abdominal pain nausea vomiting change in bowel habits or blood in stools Urogenital: No urgency frequency dysuria Neurologic: Complains of weakness and fatigue nonfocal Dermatologic no skin rash or itching  Blood pressure (!) 97/54, pulse 70, temperature 98.1 F (36.7 C), temperature source Oral, resp. rate 17, height 6' (1.829 m), weight 75.1 kg, SpO2 93 %.   General weak nondistressed Head normocephalic atraumatic Eyes pupils round and reactive Ears nose mouth throat external appearance normal Cardiovascular regular rate and rhythm no murmurs rubs gallops Respiratory no cough wheeze and obsess Abdominal system no abdominal pain nausea vomiting Urogenital unremarkable Dermatologic no skin lesions Neurologic nonfocal  Labs: Basic Metabolic Panel: Recent Labs  Lab 01/11/20 1211 01/11/20 1533 01/11/20 1633 01/11/20 1817 01/11/20 2012 01/12/20 0032  NA 106* 109* 112* 113* 110* 111*  K 3.6 3.5  --   --   --   --   CL 75* 76*  --   --   --   --   CO2 20* 21*  --   --   --   --   GLUCOSE 128* 99  --   --   --   --   BUN 8 7*  --   --   --   --   CREATININE 0.58* 0.54*  --   --   --    --   ALBUMIN 3.5  --   --   --   --   --   CALCIUM 8.2* 8.1*  --   --   --   --    Liver Function Tests: Recent Labs  Lab 01/11/20 1211  AST 79*  ALT 26  ALKPHOS 60  BILITOT 1.2  PROT 7.0  ALBUMIN 3.5   No results for input(s): LIPASE, AMYLASE in the last 168 hours. Recent Labs  Lab 01/11/20 1356  AMMONIA  24   CBC: Recent Labs  Lab 01/11/20 1211  WBC 13.8*  NEUTROABS 11.9*  HGB 14.2  HCT RESULTS UNAVAILABLE DUE TO INTERFERING SUBSTANCE  MCV RESULTS UNAVAILABLE DUE TO INTERFERING SUBSTANCE  PLT 220   PT/INR: @labrcntip (inr:5) Cardiac Enzymes: No results for input(s): CKTOTAL, CKMB, CKMBINDEX, TROPONINI in the last 168 hours. CBG: No results for input(s): GLUCAP in the last 168 hours.  Iron Studies: No results for input(s): IRON, TIBC, TRANSFERRIN, FERRITIN in the last 168 hours.  Xrays/Other Studies: CT Head Wo Contrast  Result Date: 01/11/2020 CLINICAL DATA:  Weakness.  Left facial droop. EXAM: CT HEAD WITHOUT CONTRAST TECHNIQUE: Contiguous axial images were obtained from the base of the skull through the vertex without intravenous contrast. COMPARISON:  MRI brain dated January 19, 2019. CT head dated March 21, 2018. FINDINGS: Brain: No evidence of acute infarction, hemorrhage, hydrocephalus, extra-axial collection or mass lesion/mass effect. Stable mild atrophy. Vascular: Calcified atherosclerosis at the skullbase. No hyperdense vessel. Skull: Normal. Negative for fracture or focal lesion. Sinuses/Orbits: No acute finding. Both ocular lenses are posteriorly displaced, more so on the left. Other: None. IMPRESSION: 1. No acute intracranial abnormality. 2. 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 Electronically Signed   By: March 23, 2018 M.D.   On: 01/11/2020 12:49   DG Chest Port 1 View  Result Date: 01/11/2020 CLINICAL DATA:  64 year old male with weakness for 3 months. Left side facial droop. EXAM: PORTABLE CHEST 1 VIEW COMPARISON:   Chest CT 07/03/2019. FINDINGS: Portable AP semi upright view at 1614 hours. Large lung volumes. Mediastinal contours are within normal limits. Visualized tracheal air column is within normal limits. Allowing for portable technique the lungs are clear. No pneumothorax. No acute osseous abnormality identified. Chronic right lower rib fractures. IMPRESSION: Pulmonary hyperinflation.  No acute cardiopulmonary abnormality. Electronically Signed   By: 09/02/2019 M.D.   On: 01/11/2020 16:23     Assessment/Plan: 1.  Hyponatremia with significantly low sodium this is consistent with poor solute intake high alcohol intake otherwise known as beer drinkers potomania.  Patient is appropriate responding to electrolyte replacement.  He will need aggressive treatment for alcohol abuse.  Examination of his social circumstances note to prevent recurrence of this problem. 2.  Hypertension/volume appears to have lowish blood pressures discontinue clonidine.  Also check cortisol  01/13/2020 01/12/2020, 9:00 AM

## 2020-01-12 NOTE — Consult Note (Signed)
Consulting Provider: Dr. Randol Kern Primary Care Physician:  Patient, No Pcp Per Primary Gastroenterologist:  none  Date of Admission:  01/12/2020 Date of Consultation: 01/12/2020  Reason for Consultation:  melena  HPI:  Barry Fowler is a 64 y.o. male with a past medical history of chronic alcohol abuse, approximately 12 beers daily, hypertension, dyslipidemia, tobacco use, who was admitted to [facility] yesterday afternoon after initially presenting with generalized weakness/fatigue and poor appetite.  During his work-up in the ER, patient was found to be severely hyponatremic with initial sodium of 106.  Nephrology is following and this is slowly improving.  Patient reports 1 episode of dark-colored stools which she describes as melena.  He is on Mobic chronically.  Hemoccult positive.  Patient notes very mild abdominal pain.  No radiation of his symptoms.  Does not chronically take any antiacid medications.  Denies any previous EGD or colonoscopy.  No history of H. pylori or PUD that he knows of.  No family history of colorectal malignancy.  Of note, patient's LFTs were also abnormal. AST 79, ALT 26, normal bilirubin.  Denies any family history of liver disease.  No personal history polysubstance abuse.   Past Medical History:  Diagnosis Date  . Hypertension     Past Surgical History:  Procedure Laterality Date  . EYE SURGERY      Prior to Admission medications   Medication Sig Start Date End Date Taking? Authorizing Provider  acetaminophen (TYLENOL) 500 MG tablet Take 500 mg by mouth every 6 (six) hours as needed.   Yes [provider]  losartan (COZAAR) 25 MG tablet Take 25 mg by mouth daily. 12/05/19  Yes [provider]  meloxicam (MOBIC) 15 MG tablet Take 15 mg by mouth daily. 12/05/19  Yes [provider]  hydrOXYzine (ATARAX/VISTARIL) 25 MG tablet Take 1 tablet (25 mg total) by mouth 3 (three) times daily as needed. Patient not taking: Reported  on 01/11/2020 05/12/15   Joni Reining, PA-C  permethrin (ELIMITE) 5 % cream Apply from chin down to the feet, weight 10 hours, rinse. Patient not taking: Reported on 01/11/2020 05/12/15 05/11/16  Joni Reining, PA-C  predniSONE (DELTASONE) 10 MG tablet Take 6 tablets  today, on day 2 take 5 tablets, day 3 take 4 tablets, day 4 take 3 tablets, day 5 take  2 tablets and 1 tablet the last day Patient not taking: Reported on 01/11/2020 05/12/15   Joni Reining, PA-C    Current Facility-Administered Medications  Medication Dose Route Frequency Provider Last Rate Last Admin  . 0.9 %  sodium chloride infusion   Intravenous Continuous Elgergawy, Leana Roe, MD 50 mL/hr at 01/11/20 2249 New Bag at 01/11/20 2249  . Chlorhexidine Gluconate Cloth 2 % PADS 6 each  6 each Topical Daily Elgergawy, Leana Roe, MD      . folic acid (FOLVITE) tablet 1 mg  1 mg Oral Daily Elgergawy, Leana Roe, MD   1 mg at 01/11/20 1825  . guaiFENesin-dextromethorphan (ROBITUSSIN DM) 100-10 MG/5ML syrup 10 mL  10 mL Oral Q4H PRN Elgergawy, Leana Roe, MD   10 mL at 01/11/20 2219  . heparin injection 5,000 Units  5,000 Units Subcutaneous Q8H Elgergawy, Leana Roe, MD   5,000 Units at 01/11/20 1829  . influenza vac split quadrivalent PF (FLUARIX) injection 0.5 mL  0.5 mL Intramuscular Tomorrow-1000 Elgergawy, Dawood S, MD      . LORazepam (ATIVAN) tablet 1-4 mg  1-4 mg Oral Q1H PRN Elgergawy, Mliss Fritz  S, MD       Or  . LORazepam (ATIVAN) injection 1-4 mg  1-4 mg Intravenous Q1H PRN Elgergawy, Leana Roeawood S, MD      . multivitamin with minerals tablet 1 tablet  1 tablet Oral Daily Elgergawy, Leana Roeawood S, MD   1 tablet at 01/11/20 1816  . nicotine (NICODERM CQ - dosed in mg/24 hours) patch 21 mg  21 mg Transdermal Daily Elgergawy, Leana Roeawood S, MD   21 mg at 01/11/20 1827  . pantoprazole (PROTONIX) 80 mg in sodium chloride 0.9 % 100 mL (0.8 mg/mL) infusion  8 mg/hr Intravenous Continuous Elgergawy, Leana Roeawood S, MD 10 mL/hr at 01/12/20 0623 8 mg/hr at  01/12/20 0623  . [START ON 01/15/2020] pantoprazole (PROTONIX) injection 40 mg  40 mg Intravenous Q12H Elgergawy, Leana Roeawood S, MD      . pneumococcal 23 valent vaccine (PNEUMOVAX-23) injection 0.5 mL  0.5 mL Intramuscular Tomorrow-1000 Elgergawy, Leana Roeawood S, MD      . thiamine tablet 100 mg  100 mg Oral Daily Elgergawy, Leana Roeawood S, MD   100 mg at 01/11/20 1824   Or  . thiamine (B-1) injection 100 mg  100 mg Intravenous Daily Elgergawy, Leana Roeawood S, MD        Allergies as of 01/11/2020  . (No Known Allergies)    History reviewed. No pertinent family history.  Social History   Socioeconomic History  . Marital status: Single    Spouse name: Not on file  . Number of children: Not on file  . Years of education: Not on file  . Highest education level: Not on file  Occupational History  . Not on file  Tobacco Use  . Smoking status: Current Every Day Smoker    Packs/day: 1.00    Years: 42.00    Pack years: 42.00    Types: Cigarettes  . Smokeless tobacco: Never Used  Substance and Sexual Activity  . Alcohol use: Yes    Comment: beer daily  . Drug use: Not on file  . Sexual activity: Not on file  Other Topics Concern  . Not on file  Social History Narrative  . Not on file   Social Determinants of Health   Financial Resource Strain:   . Difficulty of Paying Living Expenses: Not on file  Food Insecurity:   . Worried About Programme researcher, broadcasting/film/videounning Out of Food in the Last Year: Not on file  . Ran Out of Food in the Last Year: Not on file  Transportation Needs:   . Lack of Transportation (Medical): Not on file  . Lack of Transportation (Non-Medical): Not on file  Physical Activity:   . Days of Exercise per Week: Not on file  . Minutes of Exercise per Session: Not on file  Stress:   . Feeling of Stress : Not on file  Social Connections:   . Frequency of Communication with Friends and Family: Not on file  . Frequency of Social Gatherings with Friends and Family: Not on file  . Attends Religious  Services: Not on file  . Active Member of Clubs or Organizations: Not on file  . Attends BankerClub or Organization Meetings: Not on file  . Marital Status: Not on file  Intimate Partner Violence:   . Fear of Current or Ex-Partner: Not on file  . Emotionally Abused: Not on file  . Physically Abused: Not on file  . Sexually Abused: Not on file    Review of Systems: General: Negative for anorexia, weight loss, fever, chills, fatigue, weakness. Eyes:  Negative for vision changes.  ENT: Negative for hoarseness, difficulty swallowing , nasal congestion. CV: Negative for chest pain, angina, palpitations, dyspnea on exertion, peripheral edema.  Respiratory: Negative for dyspnea at rest, dyspnea on exertion, cough, sputum, wheezing.  GI: See history of present illness. GU:  Negative for dysuria, hematuria, urinary incontinence, urinary frequency, nocturnal urination.  MS: Negative for joint pain, low back pain.  Derm: Negative for rash or itching.  Neuro: Negative for weakness, abnormal sensation, seizure, frequent headaches, memory loss, confusion.  Psych: Negative for anxiety, depression, suicidal ideation, hallucinations.  Endo: Negative for unusual weight change.  Heme: Negative for bruising or bleeding. Allergy: Negative for rash or hives.  Physical Exam: Vital signs in last 24 hours: Temp:  [97.6 F (36.4 C)-98.1 F (36.7 C)] 98.1 F (36.7 C) (10/16 0810) Pulse Rate:  [70-96] 70 (10/16 0800) Resp:  [11-21] 17 (10/16 0800) BP: (97-156)/(51-98) 97/54 (10/16 0800) SpO2:  [89 %-95 %] 93 % (10/16 0800) Weight:  [75.1 kg] 75.1 kg (10/15 2200) Last BM Date: 01/11/20 General:   Alert,  Well-developed, well-nourished, pleasant and cooperative in NAD Head:  Normocephalic and atraumatic. Eyes:  Sclera clear, no icterus.   Conjunctiva pink. Ears:  Normal auditory acuity. Nose:  No deformity, discharge,  or lesions. Mouth:  No deformity or lesions, dentition normal. Neck:  Supple; no masses or  thyromegaly. Lungs:  Clear throughout to auscultation.   No wheezes, crackles, or rhonchi. No acute distress. Heart:  Regular rate and rhythm; no murmurs, clicks, rubs,  or gallops. Abdomen:  Soft, nontender and nondistended. No masses, hepatosplenomegaly or hernias noted. Normal bowel sounds, without guarding, and without rebound.   Rectal:  Deferred until time of colonoscopy.   Msk:  Symmetrical without gross deformities. Normal posture. Pulses:  Normal pulses noted. Extremities:  Without clubbing or edema. Neurologic:  Alert and  oriented x4;  grossly normal neurologically. Skin:  Intact without significant lesions or rashes. Cervical Nodes:  No significant cervical adenopathy. Psych:  Alert and cooperative. Normal mood and affect.  Intake/Output from previous day: 10/15 0701 - 10/16 0700 In: 1272.3 [I.V.:272.3; IV Piggyback:1000] Out: -  Intake/Output this shift: Total I/O In: 278.8 [I.V.:278.8] Out: -   Lab Results: Recent Labs    01/11/20 1211  WBC 13.8*  HGB 14.2  HCT RESULTS UNAVAILABLE DUE TO INTERFERING SUBSTANCE  PLT 220   BMET Recent Labs    01/11/20 1211 01/11/20 1211 01/11/20 1533 01/11/20 1633 01/11/20 2012 01/12/20 0032 01/12/20 0938  NA 106*   < > 109*   < > 110* 111* 112*  K 3.6  --  3.5  --   --   --   --   CL 75*  --  76*  --   --   --   --   CO2 20*  --  21*  --   --   --   --   GLUCOSE 128*  --  99  --   --   --   --   BUN 8  --  7*  --   --   --   --   CREATININE 0.58*  --  0.54*  --   --   --   --   CALCIUM 8.2*  --  8.1*  --   --   --   --    < > = values in this interval not displayed.   LFT Recent Labs    01/11/20 1211  PROT 7.0  ALBUMIN 3.5  AST 79*  ALT 26  ALKPHOS 60  BILITOT 1.2   PT/INR No results for input(s): LABPROT, INR in the last 72 hours. Hepatitis Panel No results for input(s): HEPBSAG, HCVAB, HEPAIGM, HEPBIGM in the last 72 hours. C-Diff No results for input(s): CDIFFTOX in the last 72  hours.  Studies/Results: CT Head Wo Contrast  Result Date: 01/11/2020 CLINICAL DATA:  Weakness.  Left facial droop. EXAM: CT HEAD WITHOUT CONTRAST TECHNIQUE: Contiguous axial images were obtained from the base of the skull through the vertex without intravenous contrast. COMPARISON:  MRI brain dated January 19, 2019. CT head dated March 21, 2018. FINDINGS: Brain: No evidence of acute infarction, hemorrhage, hydrocephalus, extra-axial collection or mass lesion/mass effect. Stable mild atrophy. Vascular: Calcified atherosclerosis at the skullbase. No hyperdense vessel. Skull: Normal. Negative for fracture or focal lesion. Sinuses/Orbits: No acute finding. Both ocular lenses are posteriorly displaced, more so on the left. Other: None. IMPRESSION: 1. No acute intracranial abnormality. 2. 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 Electronically Signed   By: Obie Dredge M.D.   On: 01/11/2020 12:49   DG Chest Port 1 View  Result Date: 01/11/2020 CLINICAL DATA:  64 year old male with weakness for 3 months. Left side facial droop. EXAM: PORTABLE CHEST 1 VIEW COMPARISON:  Chest CT 07/03/2019. FINDINGS: Portable AP semi upright view at 1614 hours. Large lung volumes. Mediastinal contours are within normal limits. Visualized tracheal air column is within normal limits. Allowing for portable technique the lungs are clear. No pneumothorax. No acute osseous abnormality identified. Chronic right lower rib fractures. IMPRESSION: Pulmonary hyperinflation.  No acute cardiopulmonary abnormality. Electronically Signed   By: Odessa Fleming M.D.   On: 01/11/2020 16:23    Impression: *Suspected upper GI bleeding/melena-Hemoccult positive *Hyponatremia-due to beer potomania *Abdominal pain-mild, improved *Abnormal LFTs-likely due to chronic alcohol abuse  Plan: Etiology of patient's upper GI bleeding unclear. Differential includes gastritis, peptic ulcer disease, Dieulafoy lesion, AVMs,  malignancy, or other.  Discussed further evaluation with EGD with patient today.  We will need to wait until his hyponatremia is improved prior to undergoing procedure.  We will tentatively plan on performing EGD in 24-48 hours or so.  Discussed that if he has evidence of further bleeding that would deem this procedure urgent/emergent, we may have to reevaluate prior.  He understands.  We will need to perform colonoscopy in the outpatient setting as patient has never had one prior.  The risks including infection, bleed, or perforation as well as benefits, limitations, alternatives and imponderables have been reviewed with the patient. Potential for esophageal dilation, biopsy, etc. have also been reviewed.  Questions have been answered. All parties agreeable.  Agree with Protonix IV 40 mg twice daily. Clear liquids okay from a GI standpoint. For his abnormal LFTs, this is likely due to alcohol.  We will check right upper quadrant ultrasound.  Also screen for viral hepatitis. Patient is already on alcohol withdrawal precautions. Receiving Thiamine. We will continue to monitor.  Thank you for letting us take part in this patient's care, will continue to follow along.   LOS: 1 day     01/12/2020, 11:39 AM

## 2020-01-12 NOTE — TOC Initial Note (Signed)
Transition of Care [facility]) - Initial/Assessment Note    Patient Details  Name: Barry Fowler MRN: [id number] Date of Birth: 1956-03-26  Transition of Care [facility]) CM/SW Contact:    Barry Brunner, LCSW Phone Number: 01/12/2020, 3:00 PM  Clinical Narrative:                 Patient is a 64 year old male admitted for Hyponatremia. CSW received a SA consult for patient. CSW provided consult and referral list for patient. TOC to follow        Patient Goals and CMS Choice        Expected Discharge Plan and Services                                                Prior Living Arrangements/Services                       Activities of Daily Living Home Assistive Devices/Equipment: Dan Humphreys (specify type) ADL Screening (condition at time of admission) Patient's cognitive ability adequate to safely complete daily activities?: Yes Is the patient deaf or have difficulty hearing?: No Does the patient have difficulty seeing, even when wearing glasses/contacts?: Yes Does the patient have difficulty concentrating, remembering, or making decisions?: No Patient able to express need for assistance with ADLs?: Yes Does the patient have difficulty dressing or bathing?: Yes Independently performs ADLs?: No Communication: Independent Dressing (OT): Needs assistance Is this a change from baseline?: Pre-admission baseline Grooming: Needs assistance Is this a change from baseline?: Pre-admission baseline Feeding: Independent Bathing: Needs assistance Is this a change from baseline?: Pre-admission baseline Toileting: Needs assistance Is this a change from baseline?: Pre-admission baseline In/Out Bed: Needs assistance Is this a change from baseline?: Pre-admission baseline Walks in Home: Needs assistance Is this a change from baseline?: Pre-admission baseline Does the patient have difficulty walking or climbing stairs?: Yes Weakness of Legs: Both Weakness of Arms/Hands:  None  Permission Sought/Granted                  Emotional Assessment              Admission diagnosis:  Hyponatremia [E87.1] Patient Active Problem List   Diagnosis Date Noted  . Hyponatremia 01/11/2020  . Alcohol abuse 01/11/2020   PCP:  Patient, No Pcp Per Pharmacy:   [facility] [address], [place] - [address] [place] [address] Phone: [phone] Fax: [phone]     Social Determinants of Health (SDOH) Interventions    Readmission Risk Interventions No flowsheet data found.

## 2020-01-12 NOTE — Progress Notes (Signed)
Initial Nutrition Assessment  DOCUMENTATION CODES:   Not applicable  INTERVENTION:  Monitor for diet advancement and will provide nutrition supplements as apporopriate   NUTRITION DIAGNOSIS:   Inadequate oral intake related to decreased appetite, nausea as evidenced by per patient/family report (noted per H&P).  GOAL:   Patient will meet greater than or equal to 90% of their needs    MONITOR:   Diet advancement, Labs, I & O's, Weight trends  REASON FOR ASSESSMENT:   Malnutrition Screening Tool    ASSESSMENT:  64 year old male with history of HTN, HLD, tobacco abuse and alcohol abuse presented with generalized weakness over the past couple of weeks, diminished appetite with nausea, episode of dark stool, and questionable left-sided facial droop. Pt admitted with hyopnatremia.  RD working remotely.   In ED pt found to have sodium of 106, levels improving with saline. Per notes, he drinks up to 12 beers daily and hyponatremia likely related to beer drinkers potomania. Dark colored stools on 10/15 were heme positive. Pt has been evaluated by GI, tentative plans for EGD in 24-48 hours once hyponatremia improved. He is currently NPO, will monitor for diet advancement and provide supplements as appropriate. Highly suspect degree of malnutrition given poor po intake prior to admission and likely chronic poor nutritional status secondary to alcohol abuse. Pt will be at risk for refeeding given above, with diet advancement recommend monitoring magnesium, potassium, and phosphorus daily as po intake improves. Will follow-up with pt on Monday for history and complete exam.  Per chart, weights have trended down ~4 lbs (2.5%) in the last 6 months; insignificant.   Medications reviewed and include: Folic acid, MVI, Thiamine, Protonix  Labs: Na 114 (L) trending up  NUTRITION - FOCUSED PHYSICAL EXAM:  RD working remotely.   Diet Order:   Diet Order            Diet NPO time specified  Except for: Sips with Meds  Diet effective now                 EDUCATION NEEDS:   Not appropriate for education at this time  Skin:  Skin Assessment: Skin Integrity Issues: Skin Integrity Issues:: Other (Comment) Other: scattered abrasions  Last BM:  10/15  Height:   Ht Readings from Last 1 Encounters:  01/11/20 6' (1.829 m)    Weight:   Wt Readings from Last 1 Encounters:  01/11/20 75.1 kg    BMI:  Body mass index is 22.45 kg/m.  Estimated Nutritional Needs:   Kcal:  2025-2250  Protein:  100-110  Fluid:  >/= 2 L/day  Lars Masson, RD, LDN Clinical Nutrition After Hours/Weekend Pager # in Amion

## 2020-01-12 NOTE — Progress Notes (Signed)
PROGRESS NOTE    Barry Fowler  MPN:[id number] DOB: April 09, 1955 DOA: 01/11/2020 PCP: Patient, No Pcp Per    Brief Narrative:   Barry Fowler  is a 64 y.o. male, with past medical history of hypertension, hyperlipidemia, tobacco abuse, 1 pack/day for last 30 years, sister at bedside assist with the history, patient was started last month on losartan for high blood pressure, patient with heavy alcohol abuse, up to 12 beers per day, patient presents to ED secondary couple weeks of generalized weakness, as well for last few days he had diminished appetite, and there was some questionable left-sided facial droop, as well as some nausea, and one episode of dark bowel movement, which prompted family to call EMS, patient lives with his nephew, patient denies any chest pain, denies any shortness of breath, headache, abdominal pain, vomiting, coffee-ground emesis. - in ED patient was noted to have sodium of 106, cultures pending, but hemoglobin is stable at 13.2, his potassium is 3.6, CT head with no acute findings, COVID-19 test has been negative, UA with no acute finding, negative urine drug screen, UA and chest x-ray is pending.   Assessment & Plan:   Active Problems:   Hyponatremia   Alcohol abuse   Hyponatremia -Likely related to beer drinkers potomania -Sodium levels are responding to saline. -Appreciate nephrology assistance  Acute metabolic encephalopathy -CT head negative -Likely secondary to above -Appears to be improving  Alcohol abuse -No signs of withdrawal at this time -Continue on CIWA protocol  Hypertension -Losartan currently on hold -Blood pressures are marginal -Continue to monitor  Hyperlipidemia -Resume statin when stable  GI bleeding -Patient noted to have dark-colored stools that were heme positive -He was taking Mobic prior to admission -Seen by GI with plans for endoscopy once patient has stabilized -Continue on Protonix -Continue clear liquid diet for  now  Elevated LFTs -Checking abdominal ultrasound -Although suspect this is related to alcohol -Viral hepatitis panel requested  DVT prophylaxis: heparin injection 5,000 Units Start: 01/11/20 1645 SCDs Start: 01/11/20 1637  Code Status: Full code Family Communication: Discussed with patient Disposition Plan: Status is: Inpatient  Remains inpatient appropriate because:IV treatments appropriate due to intensity of illness or inability to take PO   Dispo: The patient is from: Home              Anticipated d/c is to: Home              Anticipated d/c date is: 3 days              Patient currently is not medically stable to d/c.   Consultants:   Nephrology  Gastroenterology  Procedures:     Antimicrobials:       Subjective: Patient feels weak, no nausea or vomiting.  Objective: Vitals:   01/12/20 0700 01/12/20 0800 01/12/20 0810 01/12/20 1143  BP: (!) 98/51 (!) 97/54    Pulse: 70 70    Resp: 17 17    Temp:   98.1 F (36.7 C) (!) 97.4 F (36.3 C)  TempSrc:   Oral Axillary  SpO2: 93% 93%    Weight:      Height:        Intake/Output Summary (Last 24 hours) at 01/12/2020 1355 Last data filed at 01/12/2020 0800 Gross per 24 hour  Intake 1551.11 ml  Output --  Net 1551.11 ml   Filed Weights   01/11/20 1107 01/11/20 2200  Weight: 79.4 kg 75.1 kg    Examination:  General exam: Appears  calm and comfortable  Respiratory system: Clear to auscultation. Respiratory effort normal. Cardiovascular system: S1 & S2 heard, RRR. No JVD, murmurs, rubs, gallops or clicks. No pedal edema. Gastrointestinal system: Abdomen is nondistended, soft and nontender. No organomegaly or masses felt. Normal bowel sounds heard. Central nervous system: Alert and oriented. No focal neurological deficits. Extremities: Symmetric 5 x 5 power. Skin: No rashes, lesions or ulcers Psychiatry: Judgement and insight appear normal. Mood & affect appropriate.     Data Reviewed: I have  personally reviewed following labs and imaging studies  CBC: Recent Labs  Lab 01/11/20 1211  WBC 13.8*  NEUTROABS 11.9*  HGB 14.2  HCT RESULTS UNAVAILABLE DUE TO INTERFERING SUBSTANCE  MCV RESULTS UNAVAILABLE DUE TO INTERFERING SUBSTANCE  PLT 220   Basic Metabolic Panel: Recent Labs  Lab 01/11/20 1211 01/11/20 1211 01/11/20 1533 01/11/20 1533 01/11/20 1633 01/11/20 1817 01/11/20 2012 01/12/20 0032 01/12/20 0938  NA 106*   < > 109*   < > 112* 113* 110* 111* 112*  K 3.6  --  3.5  --   --   --   --   --   --   CL 75*  --  76*  --   --   --   --   --   --   CO2 20*  --  21*  --   --   --   --   --   --   GLUCOSE 128*  --  99  --   --   --   --   --   --   BUN 8  --  7*  --   --   --   --   --   --   CREATININE 0.58*  --  0.54*  --   --   --   --   --   --   CALCIUM 8.2*  --  8.1*  --   --   --   --   --   --    < > = values in this interval not displayed.   GFR: Estimated Creatinine Clearance: 101.7 mL/min (A) (by C-G formula based on SCr of 0.54 mg/dL (L)). Liver Function Tests: Recent Labs  Lab 01/11/20 1211  AST 79*  ALT 26  ALKPHOS 60  BILITOT 1.2  PROT 7.0  ALBUMIN 3.5   No results for input(s): LIPASE, AMYLASE in the last 168 hours. Recent Labs  Lab 01/11/20 1356  AMMONIA 24   Coagulation Profile: No results for input(s): INR, PROTIME in the last 168 hours. Cardiac Enzymes: No results for input(s): CKTOTAL, CKMB, CKMBINDEX, TROPONINI in the last 168 hours. BNP (last 3 results) No results for input(s): PROBNP in the last 8760 hours. HbA1C: No results for input(s): HGBA1C in the last 72 hours. CBG: No results for input(s): GLUCAP in the last 168 hours. Lipid Profile: No results for input(s): CHOL, HDL, LDLCALC, TRIG, CHOLHDL, LDLDIRECT in the last 72 hours. Thyroid Function Tests: Recent Labs    01/11/20 1043  TSH 0.470  FREET4 1.07   Anemia Panel: No results for input(s): VITAMINB12, FOLATE, FERRITIN, TIBC, IRON, RETICCTPCT in the last 72  hours. Sepsis Labs: Recent Labs  Lab 01/11/20 1211 01/11/20 1356  LATICACIDVEN 2.8* 1.0    Recent Results (from the past 240 hour(s))  Blood culture (routine x 2)     Status: None (Preliminary result)   Collection Time: 01/11/20 12:10 PM   Specimen: BLOOD LEFT ARM  Result  Value Ref Range Status   Specimen Description BLOOD LEFT ARM  Final   Special Requests   Final    BOTTLES DRAWN AEROBIC AND ANAEROBIC Blood Culture adequate volume   Culture   Final    NO GROWTH < 24 HOURS Performed at [facility], [address] [place], [place] [address]    Report Status PENDING  Incomplete  Blood culture (routine x 2)     Status: None (Preliminary result)   Collection Time: 01/11/20 12:11 PM   Specimen: BLOOD RIGHT FOREARM  Result Value Ref Range Status   Specimen Description BLOOD RIGHT FOREARM  Final   Special Requests Blood Culture adequate volume  Final   Culture   Final    NO GROWTH < 24 HOURS Performed at [facility], [address] [place], [place] [address]    Report Status PENDING  Incomplete  Respiratory Panel by RT PCR (Flu A&B, Covid) - Nasopharyngeal Swab     Status: None   Collection Time: 01/11/20 12:25 PM   Specimen: Nasopharyngeal Swab  Result Value Ref Range Status   SARS Coronavirus 2 by RT PCR NEGATIVE NEGATIVE Final    Comment: (NOTE) SARS-CoV-2 target nucleic acids are NOT DETECTED.  The SARS-CoV-2 RNA is generally detectable in upper respiratoy specimens during the acute phase of infection. The lowest concentration of SARS-CoV-2 viral copies this assay can detect is 131 copies/mL. A negative result does not preclude SARS-Cov-2 infection and should not be used as the sole basis for treatment or other patient management decisions. A negative result may occur with  improper specimen collection/handling, submission of specimen other than nasopharyngeal swab, presence of viral mutation(s) within the areas targeted by this assay, and inadequate number of viral  copies (<131 copies/mL). A negative result must be combined with clinical observations, patient history, and epidemiological information. The expected result is Negative.  Fact Sheet for Patients:  https://www.moore.com/  Fact Sheet for Healthcare Providers:  https://www.young.biz/  This test is no t yet approved or cleared by the [place] FDA and  has been authorized for detection and/or diagnosis of SARS-CoV-2 by FDA under an Emergency Use Authorization (EUA). This EUA will remain  in effect (meaning this test can be used) for the duration of the COVID-19 declaration under Section 564(b)(1) of the Act, 21 U.S.C. section 360bbb-3(b)(1), unless the authorization is terminated or revoked sooner.     Influenza A by PCR NEGATIVE NEGATIVE Final   Influenza B by PCR NEGATIVE NEGATIVE Final    Comment: (NOTE) The Xpert Xpress SARS-CoV-2/FLU/RSV assay is intended as an aid in  the diagnosis of influenza from Nasopharyngeal swab specimens and  should not be used as a sole basis for treatment. Nasal washings and  aspirates are unacceptable for Xpert Xpress SARS-CoV-2/FLU/RSV  testing.  Fact Sheet for Patients: https://www.moore.com/  Fact Sheet for Healthcare Providers: https://www.young.biz/  This test is not yet approved or cleared by the [place] FDA and  has been authorized for detection and/or diagnosis of SARS-CoV-2 by  FDA under an Emergency Use Authorization (EUA). This EUA will remain  in effect (meaning this test can be used) for the duration of the  Covid-19 declaration under Section 564(b)(1) of the Act, 21  U.S.C. section 360bbb-3(b)(1), unless the authorization is  terminated or revoked. Performed at [facility], [address] [place], [place] [address]          Radiology Studies: CT Head Wo Contrast  Result Date: 01/11/2020 CLINICAL DATA:  Weakness.  Left  facial droop.  EXAM: CT HEAD WITHOUT CONTRAST TECHNIQUE: Contiguous axial images were obtained from the base of the skull through the vertex without intravenous contrast. COMPARISON:  MRI brain dated January 19, 2019. CT head dated March 21, 2018. FINDINGS: Brain: No evidence of acute infarction, hemorrhage, hydrocephalus, extra-axial collection or mass lesion/mass effect. Stable mild atrophy. Vascular: Calcified atherosclerosis at the skullbase. No hyperdense vessel. Skull: Normal. Negative for fracture or focal lesion. Sinuses/Orbits: No acute finding. Both ocular lenses are posteriorly displaced, more so on the left. Other: None. IMPRESSION: 1. No acute intracranial abnormality. 2. 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 Electronically Signed   By: Obie Dredge M.D.   On: 01/11/2020 12:49   DG Chest Port 1 View  Result Date: 01/11/2020 CLINICAL DATA:  64 year old male with weakness for 3 months. Left side facial droop. EXAM: PORTABLE CHEST 1 VIEW COMPARISON:  Chest CT 07/03/2019. FINDINGS: Portable AP semi upright view at 1614 hours. Large lung volumes. Mediastinal contours are within normal limits. Visualized tracheal air column is within normal limits. Allowing for portable technique the lungs are clear. No pneumothorax. No acute osseous abnormality identified. Chronic right lower rib fractures. IMPRESSION: Pulmonary hyperinflation.  No acute cardiopulmonary abnormality. Electronically Signed   By: Odessa Fleming M.D.   On: 01/11/2020 16:23        Scheduled Meds: . Chlorhexidine Gluconate Cloth  6 each Topical Daily  . folic acid  1 mg Oral Daily  . heparin  5,000 Units Subcutaneous Q8H  . multivitamin with minerals  1 tablet Oral Daily  . nicotine  21 mg Transdermal Daily  . [START ON 01/15/2020] pantoprazole  40 mg Intravenous Q12H  . thiamine  100 mg Oral Daily   Or  . thiamine  100 mg Intravenous Daily   Continuous Infusions: . sodium chloride 50 mL/hr at 01/11/20 2249  .  pantoprozole (PROTONIX) infusion 8 mg/hr (01/12/20 0623)     LOS: 1 day    Time spent:    Erick Blinks, MD [facility]   If 7PM-7AM, please contact night-coverage [email or web address]  01/12/2020, 1:55 PM

## 2020-01-13 ENCOUNTER — Inpatient Hospital Stay (HOSPITAL_COMMUNITY): Payer: Medicare Other

## 2020-01-13 DIAGNOSIS — R945 Abnormal results of liver function studies: Secondary | ICD-10-CM | POA: Diagnosis not present

## 2020-01-13 DIAGNOSIS — K922 Gastrointestinal hemorrhage, unspecified: Secondary | ICD-10-CM | POA: Diagnosis not present

## 2020-01-13 DIAGNOSIS — F1010 Alcohol abuse, uncomplicated: Secondary | ICD-10-CM | POA: Diagnosis not present

## 2020-01-13 DIAGNOSIS — K921 Melena: Secondary | ICD-10-CM | POA: Diagnosis not present

## 2020-01-13 DIAGNOSIS — E871 Hypo-osmolality and hyponatremia: Secondary | ICD-10-CM | POA: Diagnosis not present

## 2020-01-13 LAB — COMPREHENSIVE METABOLIC PANEL
ALT: 21 U/L (ref 0–44)
AST: 35 U/L (ref 15–41)
Albumin: 3 g/dL — ABNORMAL LOW (ref 3.5–5.0)
Alkaline Phosphatase: 54 U/L (ref 38–126)
Anion gap: 10 (ref 5–15)
BUN: 5 mg/dL — ABNORMAL LOW (ref 8–23)
CO2: 24 mmol/L (ref 22–32)
Calcium: 8.1 mg/dL — ABNORMAL LOW (ref 8.9–10.3)
Chloride: 82 mmol/L — ABNORMAL LOW (ref 98–111)
Creatinine, Ser: 0.58 mg/dL — ABNORMAL LOW (ref 0.61–1.24)
GFR, Estimated: >60 mL/min (ref >60)
Glucose, Bld: 83 mg/dL (ref 70–99)
Potassium: 3.3 mmol/L — ABNORMAL LOW (ref 3.5–5.1)
Sodium: 116 mmol/L — CL (ref 135–145)
Total Bilirubin: 1.3 mg/dL — ABNORMAL HIGH (ref 0.3–1.2)
Total Protein: 6.1 g/dL — ABNORMAL LOW (ref 6.5–8.1)

## 2020-01-13 LAB — CBC
HCT: 36.6 % — ABNORMAL LOW (ref 39.0–52.0)
Hemoglobin: 13.4 g/dL (ref 13.0–17.0)
MCH: 32.1 pg (ref 26.0–34.0)
MCHC: 36.6 g/dL — ABNORMAL HIGH (ref 30.0–36.0)
MCV: 87.8 fL (ref 80.0–100.0)
Platelets: 193 10*3/uL (ref 150–400)
RBC: 4.17 MIL/uL — ABNORMAL LOW (ref 4.22–5.81)
RDW: 11.9 % (ref 11.5–15.5)
WBC: 8.7 10*3/uL (ref 4.0–10.5)
nRBC: 0 % (ref 0.0–0.2)

## 2020-01-13 LAB — SODIUM
Sodium: 115 mmol/L — CL (ref 135–145)
Sodium: 116 mmol/L — CL (ref 135–145)
Sodium: 116 mmol/L — CL (ref 135–145)
Sodium: 118 mmol/L — CL (ref 135–145)

## 2020-01-13 LAB — HEPATITIS PANEL, ACUTE
HCV Ab: NONREACTIVE
Hep A IgM: NONREACTIVE
Hep B C IgM: NONREACTIVE
Hepatitis B Surface Ag: NONREACTIVE

## 2020-01-13 MED ORDER — POTASSIUM CHLORIDE CRYS ER 20 MEQ PO TBCR
40.0000 meq | EXTENDED_RELEASE_TABLET | ORAL | Status: AC
  Administered 2020-01-13 (×2): 40 meq via ORAL
  Filled 2020-01-13 (×2): qty 2

## 2020-01-13 NOTE — Consult Note (Signed)
Reason for Consult: Hyponatremia Referring Physician: Dr. Geralynn Rile Barry Fowler is an 64 y.o. male.  HPI: Past medical history is pertinent for hypertension hyperlipidemia tobacco abuse 1 pack/day for 30 years.  History of heavy alcohol use 12 beers a day presents to the emergency room with generalized weakness.  In the emergency was discovered that he had a sodium of 106. Urine sodium 15.  I do not see a urine osmolality.   TSH 0.47 and normal renal function.  He is responded nicely to IV hydration.  With an appropriate rise in his sodium to 111.  Blood pressure 149/85 pulse 95 temperature 98 O2 sats 97% room air.  Urine output not recorded.  Sodium 115 hemoglobin 13.4 fecal occult testing positive.   folic acid 1 mg daily, multivitamins 1 daily Protonix 40 mg daily, thiamine 100 mg daily  IV sodium chloride 50 cc an hour   Trend in Creatinine: Creatinine  Date/Time Value Ref Range Status  03/24/2013 09:05 PM 0.75 0.60 - 1.30 mg/dL Final  09/98/3382 50:53 PM 0.63 0.60 - 1.30 mg/dL Final   Creatinine, Ser  Date/Time Value Ref Range Status  01/11/2020 03:33 PM 0.54 (L) 0.61 - 1.24 mg/dL Final  97/67/3419 37:90 PM 0.58 (L) 0.61 - 1.24 mg/dL Final  24/11/7351 29:92 PM 0.90 0.61 - 1.24 mg/dL Final   Sodium  Date/Time Value Ref Range Status  01/13/2020 01:41 AM 115 (LL) 135 - 145 mmol/L Final    Comment:    CRITICAL RESULT CALLED TO, READ BACK BY AND VERIFIED WITH: DANIELS,J @ 0219 ON 01/13/20 BY JUW Performed at [facility], [address], [place], [place] [address]   01/12/2020 10:10 PM 113 (LL) 135 - 145 mmol/L Final    Comment:    CRITICAL RESULT CALLED TO, READ BACK BY AND VERIFIED WITH: DANIELS,J AT 2306 ON 01/12/2020 BY MOSLEY,J Performed at [facility], [address], [place], [place] [address]   01/12/2020 02:50 PM 114 (LL) 135 - 145 mmol/L Final    Comment:    CRITICAL RESULT CALLED TO, READ BACK BY AND VERIFIED WITH: HYLTON L. @ 1547 ON [id number] BY HENDERSON  L. Performed at [facility], [address] [place], [place] [address]   01/12/2020 09:38 AM 112 (LL) 135 - 145 mmol/L Final    Comment:    CRITICAL RESULT CALLED TO, READ BACK BY AND VERIFIED WITH: HYLTON L. @ 1036 ON [id number] BY HENDERSON L. Performed at [facility], [address] [place], [place] [address]   01/12/2020 12:32 AM 111 (LL) 135 - 145 mmol/L Final    Comment:    CRITICAL RESULT CALLED TO, READ BACK BY AND VERIFIED WITH: PENG,S @ 0131 ON 01/12/20 BY JUW Performed at [facility], [address] [place], [place] [address]   01/11/2020 08:12 PM 110 (LL) 135 - 145 mmol/L Final    Comment:    CRITICAL RESULT CALLED TO, READ BACK BY AND VERIFIED WITH: PINNIX,H ON 01/11/20 AT 2110 BY LOY,C Performed at [facility], [address], [place], [place] [address]   01/11/2020 06:17 PM 113 (LL) 135 - 145 mmol/L Final    Comment:    CRITICAL RESULT CALLED TO, READ BACK BY AND VERIFIED WITH: HARDIN,J ON 01/11/20 AT 1935 BY LOY,C Performed at [facility], [address] [place], [place] [address]   01/11/2020 04:33 PM 112 (LL) 135 - 145 mmol/L Final    Comment:    CRITICAL RESULT CALLED TO, READ BACK BY AND VERIFIED WITH: Rayfield Citizen  ON 01/11/20 AT 1745 BY LOY,C Performed at [facility], [address] [place], [place] [address]   01/11/2020 03:33 PM 109 (LL) 135 - 145 mmol/L Final    Comment:    CRITICAL RESULT CALLED TO, READ BACK BY AND VERIFIED WITH: DR.ELGERGAWY,ON 01/11/20 AT 1645 BY LOY,C   01/11/2020 12:11 PM 106 (LL) 135 - 145 mmol/L Final    Comment:    CRITICAL RESULT CALLED TO, READ BACK BY AND VERIFIED WITH: JENNIFER @ 1408 ON [id number] BY HENDERSON L.   03/24/2013 09:05 PM 136 136 - 145 mmol/L Final  01/10/2012 12:48 PM 131 (L) 136 - 145 mmol/L Final   PMH:   Past Medical History:  Diagnosis Date  . Hypertension     PSH:   Past Surgical History:  Procedure Laterality Date  . EYE SURGERY      Allergies: No Known Allergies  Medications:   Prior to  Admission medications   Medication Sig Start Date End Date Taking? Authorizing Provider  acetaminophen (TYLENOL) 500 MG tablet Take 500 mg by mouth every 6 (six) hours as needed.   Yes [provider]  losartan (COZAAR) 25 MG tablet Take 25 mg by mouth daily. 12/05/19  Yes [provider]  meloxicam (MOBIC) 15 MG tablet Take 15 mg by mouth daily. 12/05/19  Yes [provider]  hydrOXYzine (ATARAX/VISTARIL) 25 MG tablet Take 1 tablet (25 mg total) by mouth 3 (three) times daily as needed. Patient not taking: Reported on 01/11/2020 05/12/15   Barry Fowler, Barry K, PA-C  permethrin (ELIMITE) 5 % cream Apply from chin down to the feet, weight 10 hours, rinse. Patient not taking: Reported on 01/11/2020 05/12/15 05/11/16  Barry Fowler, Barry K, PA-C  predniSONE (DELTASONE) 10 MG tablet Take 6 tablets  today, on day 2 take 5 tablets, day 3 take 4 tablets, day 4 take 3 tablets, day 5 take  2 tablets and 1 tablet the last day Patient not taking: Reported on 01/11/2020 05/12/15   Barry Fowler, Barry K, PA-C    Discontinued Meds:   Medications Discontinued During This Encounter  Medication Reason  . 0.9 %  sodium chloride infusion   . pantoprazole (PROTONIX) injection 40 mg   . cloNIDine (CATAPRES) tablet 0.1 mg   . pantoprazole (PROTONIX) 80 mg in sodium chloride 0.9 % 100 mL (0.8 mg/mL) infusion   . pantoprazole (PROTONIX) injection 40 mg     Social History:  reports that he has been smoking cigarettes. He has a 42.00 pack-year smoking history. He has never used smokeless tobacco. He reports current alcohol use. No history on file for drug use.  Family History:  History reviewed. No pertinent family history.  Review of systems General: Weakness and fatigue Eyes no visual complaints Ears nose mouth throat no hearing loss epistaxis or sore throat Cardiovascular no complaints chest pain no complaints of shortness of breath or ankle leg swelling Respiratory denies cough wheeze and  obsess Abdominal: No abdominal pain nausea vomiting change in bowel habits or blood in stools Urogenital: No urgency frequency dysuria Neurologic: Complains of weakness and fatigue nonfocal Dermatologic no skin rash or itching  Blood pressure (!) 149/85, pulse 75, temperature 98 F (36.7 C), temperature source Oral, resp. rate 14, height 6' (1.829 m), weight 75.1 kg, SpO2 94 %.   General weak nondistressed Head normocephalic atraumatic Eyes pupils round and reactive Ears nose mouth throat external appearance normal Cardiovascular regular rate and rhythm no murmurs rubs gallops Respiratory no cough wheeze and obsess Abdominal  system no abdominal pain nausea vomiting Urogenital unremarkable Dermatologic no skin lesions Neurologic nonfocal  Labs: Basic Metabolic Panel: Recent Labs  Lab 01/11/20 1211 01/11/20 1211 01/11/20 1533 01/11/20 1633 01/11/20 1817 01/11/20 2012 01/12/20 0032 01/12/20 0938 01/12/20 1450 01/12/20 2210 01/13/20 0141  NA 106*   < > 109*   < > 113* 110* 111* 112* 114* 113* 115*  Fowler 3.6  --  3.5  --   --   --   --   --   --   --   --   CL 75*  --  76*  --   --   --   --   --   --   --   --   CO2 20*  --  21*  --   --   --   --   --   --   --   --   GLUCOSE 128*  --  99  --   --   --   --   --   --   --   --   BUN 8  --  7*  --   --   --   --   --   --   --   --   CREATININE 0.58*  --  0.54*  --   --   --   --   --   --   --   --   ALBUMIN 3.5  --   --   --   --   --   --   --   --   --   --   CALCIUM 8.2*  --  8.1*  --   --   --   --   --   --   --   --    < > = values in this interval not displayed.   Liver Function Tests: Recent Labs  Lab 01/11/20 1211  AST 79*  ALT 26  ALKPHOS 60  BILITOT 1.2  PROT 7.0  ALBUMIN 3.5   No results for input(s): LIPASE, AMYLASE in the last 168 hours. Recent Labs  Lab 01/11/20 1356  AMMONIA 24   CBC: Recent Labs  Lab 01/11/20 1211 01/12/20 0609 01/13/20 0601  WBC 13.8* 9.8 8.7  NEUTROABS 11.9*  --   --    HGB 14.2 13.5 13.4  HCT RESULTS UNAVAILABLE DUE TO INTERFERING SUBSTANCE 37.4* 36.6*  MCV RESULTS UNAVAILABLE DUE TO INTERFERING SUBSTANCE 88.8 87.8  PLT 220 195 193   PT/INR: @labrcntip (inr:5) Cardiac Enzymes: No results for input(s): CKTOTAL, CKMB, CKMBINDEX, TROPONINI in the last 168 hours. CBG: No results for input(s): GLUCAP in the last 168 hours.  Iron Studies: No results for input(s): IRON, TIBC, TRANSFERRIN, FERRITIN in the last 168 hours.  Xrays/Other Studies: CT Head Wo Contrast  Result Date: 01/11/2020 CLINICAL DATA:  Weakness.  Left facial droop. EXAM: CT HEAD WITHOUT CONTRAST TECHNIQUE: Contiguous axial images were obtained from the base of the skull through the vertex without intravenous contrast. COMPARISON:  MRI brain dated January 19, 2019. CT head dated March 21, 2018. FINDINGS: Brain: No evidence of acute infarction, hemorrhage, hydrocephalus, extra-axial collection or mass lesion/mass effect. Stable mild atrophy. Vascular: Calcified atherosclerosis at the skullbase. No hyperdense vessel. Skull: Normal. Negative for fracture or focal lesion. Sinuses/Orbits: No acute finding. Both ocular lenses are posteriorly displaced, more so on the left. Other: None. IMPRESSION: 1. No acute intracranial abnormality. 2. Posterior displacement of both ocular  lenses, more so on the left. Electronically Signed   By: Obie Dredge M.D.   On: 01/11/2020 12:49   DG Chest Port 1 View  Result Date: 01/11/2020 CLINICAL DATA:  64 year old male with weakness for 3 months. Left side facial droop. EXAM: PORTABLE CHEST 1 VIEW COMPARISON:  Chest CT 07/03/2019. FINDINGS: Portable AP semi upright view at 1614 hours. Large lung volumes. Mediastinal contours are within normal limits. Visualized tracheal air column is within normal limits. Allowing for portable technique the lungs are clear. No pneumothorax. No acute osseous abnormality identified. Chronic right lower rib fractures. IMPRESSION:  Pulmonary hyperinflation.  No acute cardiopulmonary abnormality. Electronically Signed   By: Odessa Fleming M.D.   On: 01/11/2020 16:23     Assessment/Plan: 1.  Hyponatremia with significantly low sodium this is consistent with poor solute intake high alcohol intake otherwise known as beer drinkers potomania.  Patient is appropriate responding to electrolyte replacement.  He will need aggressive treatment for alcohol abuse.  Examination of his social circumstances note to prevent recurrence of this problem. 2.  Hypertension/volume appears to have lowish blood pressures discontinue clonidine.  Cortisol level 19.5  Garnetta Buddy 01/13/2020, 6:44 AM

## 2020-01-13 NOTE — Progress Notes (Signed)
PROGRESS NOTE    Barry Fowler  ZOX:[id number] DOB: 03/31/1955 DOA: 01/11/2020 PCP: Patient, No Pcp Per    Brief Narrative:   Barry Fowler  is a 64 y.o. male, with past medical history of hypertension, hyperlipidemia, tobacco abuse, 1 pack/day for last 30 years, sister at bedside assist with the history, patient was started last month on losartan for high blood pressure, patient with heavy alcohol abuse, up to 12 beers per day, patient presents to ED secondary couple weeks of generalized weakness, as well for last few days he had diminished appetite, and there was some questionable left-sided facial droop, as well as some nausea, and one episode of dark bowel movement, which prompted family to call EMS, patient lives with his nephew, patient denies any chest pain, denies any shortness of breath, headache, abdominal pain, vomiting, coffee-ground emesis. - in ED patient was noted to have sodium of 106, cultures pending, but hemoglobin is stable at 13.2, his potassium is 3.6, CT head with no acute findings, COVID-19 test has been negative, UA with no acute finding, negative urine drug screen, UA and chest x-ray is pending.   Assessment & Plan:   Active Problems:   Hyponatremia   Alcohol abuse   Hyponatremia -Likely related to beer drinkers potomania -Sodium levels are responding to saline. -Appreciate nephrology assistance  Acute metabolic encephalopathy -CT head negative -Likely secondary to above -Appears to be improving  Alcohol abuse -No signs of withdrawal at this time -Continue on CIWA protocol  Hypertension -Losartan currently on hold -Blood pressures are stable -Continue to monitor  Hyperlipidemia -Resume statin when stable  GI bleeding -Patient noted to have dark-colored stools that were heme positive -He was taking Mobic prior to admission -Seen by GI with plans for endoscopy once serum sodium greater than 130 -Continue on Protonix -Continue clear liquid diet for  now  Elevated LFTs -Abdominal ultrasound negative -Although suspect this is related to alcohol -Viral hepatitis panel negative  DVT prophylaxis: heparin injection 5,000 Units Start: 01/11/20 1645 SCDs Start: 01/11/20 1637  Code Status: Full code Family Communication: Discussed with patient Disposition Plan: Status is: Inpatient  Remains inpatient appropriate because:IV treatments appropriate due to intensity of illness or inability to take PO   Dispo: The patient is from: Home              Anticipated d/c is to: Home              Anticipated d/c date is: 3 days              Patient currently is not medically stable to d/c.   Consultants:   Nephrology  Gastroenterology  Procedures:     Antimicrobials:       Subjective: No nausea or vomiting.  He is thirsty.  Still feels weak. Objective: Vitals:   01/13/20 1500 01/13/20 1600 01/13/20 1700 01/13/20 1800  BP: 129/73 130/89 114/71 (!) 149/93  Pulse: 74 79 72 85  Resp: 20 20 (!) 21 (!) 25  Temp:   98 F (36.7 C)   TempSrc:   Oral   SpO2: 92% 93% 92% 90%  Weight:      Height:        Intake/Output Summary (Last 24 hours) at 01/13/2020 1847 Last data filed at 01/13/2020 1832 Gross per 24 hour  Intake 2363.71 ml  Output 800 ml  Net 1563.71 ml   Filed Weights   01/11/20 1107 01/11/20 2200  Weight: 79.4 kg 75.1 kg    Examination:  General exam: Alert, awake, oriented x 3 Respiratory system: Clear to auscultation. Respiratory effort normal. Cardiovascular system:RRR. No murmurs, rubs, gallops. Gastrointestinal system: Abdomen is nondistended, soft and nontender. No organomegaly or masses felt. Normal bowel sounds heard. Central nervous system: Alert and oriented. No focal neurological deficits. Extremities: No C/C/E, +pedal pulses Skin: No rashes, lesions or ulcers Psychiatry: Judgement and insight appear normal. Mood & affect appropriate.    Data Reviewed: I have personally reviewed following labs and  imaging studies  CBC: Recent Labs  Lab 01/11/20 1211 01/12/20 0609 01/13/20 0601  WBC 13.8* 9.8 8.7  NEUTROABS 11.9*  --   --   HGB 14.2 13.5 13.4  HCT RESULTS UNAVAILABLE DUE TO INTERFERING SUBSTANCE 37.4* 36.6*  MCV RESULTS UNAVAILABLE DUE TO INTERFERING SUBSTANCE 88.8 87.8  PLT 220 195 193   Basic Metabolic Panel: Recent Labs  Lab 01/11/20 1211 01/11/20 1211 01/11/20 1533 01/11/20 1633 01/12/20 2210 01/13/20 0141 01/13/20 0601 01/13/20 0944 01/13/20 1342  NA 106*   < > 109*   < > 113* 115* 116* 116* 116*  K 3.6  --  3.5  --   --   --  3.3*  --   --   CL 75*  --  76*  --   --   --  82*  --   --   CO2 20*  --  21*  --   --   --  24  --   --   GLUCOSE 128*  --  99  --   --   --  83  --   --   BUN 8  --  7*  --   --   --  5*  --   --   CREATININE 0.58*  --  0.54*  --   --   --  0.58*  --   --   CALCIUM 8.2*  --  8.1*  --   --   --  8.1*  --   --    < > = values in this interval not displayed.   GFR: Estimated Creatinine Clearance: 101.7 mL/min (A) (by C-G formula based on SCr of 0.58 mg/dL (L)). Liver Function Tests: Recent Labs  Lab 01/11/20 1211 01/13/20 0601  AST 79* 35  ALT 26 21  ALKPHOS 60 54  BILITOT 1.2 1.3*  PROT 7.0 6.1*  ALBUMIN 3.5 3.0*   No results for input(s): LIPASE, AMYLASE in the last 168 hours. Recent Labs  Lab 01/11/20 1356  AMMONIA 24   Coagulation Profile: No results for input(s): INR, PROTIME in the last 168 hours. Cardiac Enzymes: No results for input(s): CKTOTAL, CKMB, CKMBINDEX, TROPONINI in the last 168 hours. BNP (last 3 results) No results for input(s): PROBNP in the last 8760 hours. HbA1C: No results for input(s): HGBA1C in the last 72 hours. CBG: No results for input(s): GLUCAP in the last 168 hours. Lipid Profile: No results for input(s): CHOL, HDL, LDLCALC, TRIG, CHOLHDL, LDLDIRECT in the last 72 hours. Thyroid Function Tests: Recent Labs    01/11/20 1043  TSH 0.470  FREET4 1.07   Anemia Panel: No results for  input(s): VITAMINB12, FOLATE, FERRITIN, TIBC, IRON, RETICCTPCT in the last 72 hours. Sepsis Labs: Recent Labs  Lab 01/11/20 1211 01/11/20 1356  LATICACIDVEN 2.8* 1.0    Recent Results (from the past 240 hour(s))  Blood culture (routine x 2)     Status: None (Preliminary result)   Collection Time: 01/11/20 12:10 PM   Specimen: BLOOD  LEFT ARM  Result Value Ref Range Status   Specimen Description BLOOD LEFT ARM  Final   Special Requests   Final    BOTTLES DRAWN AEROBIC AND ANAEROBIC Blood Culture adequate volume   Culture   Final    NO GROWTH < 24 HOURS Performed at [facility], [address] [place], [place] [address]    Report Status PENDING  Incomplete  Blood culture (routine x 2)     Status: None (Preliminary result)   Collection Time: 01/11/20 12:11 PM   Specimen: BLOOD RIGHT FOREARM  Result Value Ref Range Status   Specimen Description BLOOD RIGHT FOREARM  Final   Special Requests Blood Culture adequate volume  Final   Culture   Final    NO GROWTH < 24 HOURS Performed at [facility], [address], [place], [place] [address]    Report Status PENDING  Incomplete  Respiratory Panel by RT PCR (Flu A&B, Covid) - Nasopharyngeal Swab     Status: None   Collection Time: 01/11/20 12:25 PM   Specimen: Nasopharyngeal Swab  Result Value Ref Range Status   SARS Coronavirus 2 by RT PCR NEGATIVE NEGATIVE Final    Comment: (NOTE) SARS-CoV-2 target nucleic acids are NOT DETECTED.  The SARS-CoV-2 RNA is generally detectable in upper respiratoy specimens during the acute phase of infection. The lowest concentration of SARS-CoV-2 viral copies this assay can detect is 131 copies/mL. A negative result does not preclude SARS-Cov-2 infection and should not be used as the sole basis for treatment or other patient management decisions. A negative result may occur with  improper specimen collection/handling, submission of specimen other than nasopharyngeal swab, presence of viral  mutation(s) within the areas targeted by this assay, and inadequate number of viral copies (<131 copies/mL). A negative result must be combined with clinical observations, patient history, and epidemiological information. The expected result is Negative.  Fact Sheet for Patients:  https://www.moore.com/  Fact Sheet for Healthcare Providers:  https://www.young.biz/  This test is no t yet approved or cleared by the [place] FDA and  has been authorized for detection and/or diagnosis of SARS-CoV-2 by FDA under an Emergency Use Authorization (EUA). This EUA will remain  in effect (meaning this test can be used) for the duration of the COVID-19 declaration under Section 564(b)(1) of the Act, 21 U.S.C. section 360bbb-3(b)(1), unless the authorization is terminated or revoked sooner.     Influenza A by PCR NEGATIVE NEGATIVE Final   Influenza B by PCR NEGATIVE NEGATIVE Final    Comment: (NOTE) The Xpert Xpress SARS-CoV-2/FLU/RSV assay is intended as an aid in  the diagnosis of influenza from Nasopharyngeal swab specimens and  should not be used as a sole basis for treatment. Nasal washings and  aspirates are unacceptable for Xpert Xpress SARS-CoV-2/FLU/RSV  testing.  Fact Sheet for Patients: https://www.moore.com/  Fact Sheet for Healthcare Providers: https://www.young.biz/  This test is not yet approved or cleared by the [place] FDA and  has been authorized for detection and/or diagnosis of SARS-CoV-2 by  FDA under an Emergency Use Authorization (EUA). This EUA will remain  in effect (meaning this test can be used) for the duration of the  Covid-19 declaration under Section 564(b)(1) of the Act, 21  U.S.C. section 360bbb-3(b)(1), unless the authorization is  terminated or revoked. Performed at [facility], [address] [place], [place] [address]   MRSA PCR Screening     Status: None    Collection Time: 01/11/20  8:34 PM  Specimen: Nasal Mucosa; Nasopharyngeal  Result Value Ref Range Status   MRSA by PCR NEGATIVE NEGATIVE Final    Comment:        The GeneXpert MRSA Assay (FDA approved for NASAL specimens only), is one component of a comprehensive MRSA colonization surveillance program. It is not intended to diagnose MRSA infection nor to guide or monitor treatment for MRSA infections. Performed at [facility], [address] [place], [place] [address]          Radiology Studies: US Abdomen Complete  Result Date: 01/13/2020 CLINICAL DATA:  Abnormal liver function tests. EXAM: ABDOMEN ULTRASOUND COMPLETE COMPARISON:  None. FINDINGS: Gallbladder: No gallstones or wall thickening visualized. No sonographic Murphy sign noted by sonographer. Common bile duct: Diameter: 3 mm Liver: Moderately increased parenchymal echogenicity diffusely without a focal lesion identified. Portal vein is patent on color Doppler imaging with normal direction of blood flow towards the liver. IVC: No abnormality visualized. Pancreas: Visualized portion unremarkable. Spleen: Size and appearance within normal limits. Right Kidney: Length: 12.6 cm. Echogenicity within normal limits. No mass or hydronephrosis visualized. Left Kidney: Length: 11.5 cm. Echogenicity within normal limits. No mass or hydronephrosis visualized. Abdominal aorta: No aneurysm visualized with the aortic bifurcation being obscured. Other findings: None. IMPRESSION: 1. Echogenic liver, nonspecific though often seen with steatosis. 2. Otherwise unremarkable abdominal ultrasound. Electronically Signed   By: Sebastian Ache M.D.   On: 01/13/2020 14:20        Scheduled Meds: . Chlorhexidine Gluconate Cloth  6 each Topical Daily  . folic acid  1 mg Oral Daily  . heparin  5,000 Units Subcutaneous Q8H  . multivitamin with minerals  1 tablet Oral Daily  . nicotine  21 mg Transdermal Daily  . pantoprazole  40 mg Intravenous Q12H   . thiamine  100 mg Oral Daily   Or  . thiamine  100 mg Intravenous Daily   Continuous Infusions: . sodium chloride 75 mL/hr at 01/13/20 1832     LOS: 2 days    Time spent:    Erick Blinks, MD [facility]   If 7PM-7AM, please contact night-coverage [email or web address]  01/13/2020, 6:47 PM

## 2020-01-13 NOTE — Progress Notes (Signed)
Messaged Dr. Kerry Hough with new lab result of sodium 116.

## 2020-01-13 NOTE — Progress Notes (Signed)
Critical sodium of 118 which is up from 116

## 2020-01-13 NOTE — Progress Notes (Signed)
Subjective: Patient seen and examined this morning.  Has no complaints for me today.  Conversing without any issues.  No further episodes of melena.  Sodium slowly correcting as expected  Objective: Vital signs in last 24 hours: Temp:  [97.6 F (36.4 C)-98 F (36.7 C)] 97.9 F (36.6 C) (10/17 0900) Pulse Rate:  [65-94] 82 (10/17 1300) Resp:  [14-22] 19 (10/17 1300) BP: (112-172)/(67-110) 134/81 (10/17 1300) SpO2:  [87 %-96 %] 91 % (10/17 1300) Last BM Date: 01/11/20 General:   Alert and oriented, pleasant Head:  Normocephalic and atraumatic. Eyes:  No icterus, sclera clear. Conjuctiva pink.  Mouth:  Without lesions, mucosa pink and moist.  Neck:  Supple, without thyromegaly or masses.  Heart:  S1, S2 present, no murmurs noted.  Lungs: Clear to auscultation bilaterally, without wheezing, rales, or rhonchi.  Abdomen:  Bowel sounds present, soft, non-tender, non-distended. No HSM or hernias noted. No rebound or guarding. No masses appreciated  Msk:  Symmetrical without gross deformities. Normal posture. Pulses:  Normal pulses noted. Extremities:  Without clubbing or edema. Neurologic:  Alert and  oriented x4;  grossly normal neurologically. Skin:  Warm and dry, intact without significant lesions.  Cervical Nodes:  No significant cervical adenopathy. Psych:  Alert and cooperative. Normal mood and affect.  Intake/Output from previous day: 10/16 0701 - 10/17 0700 In: 682.3 [I.V.:682.3] Out: -  Intake/Output this shift: Total I/O In: 846.1 [I.V.:846.1] Out: -   Lab Results: Recent Labs    01/11/20 1211 01/12/20 0609 01/13/20 0601  WBC 13.8* 9.8 8.7  HGB 14.2 13.5 13.4  HCT RESULTS UNAVAILABLE DUE TO INTERFERING SUBSTANCE 37.4* 36.6*  PLT 220 195 193   BMET Recent Labs    01/11/20 1211 01/11/20 1211 01/11/20 1533 01/11/20 1633 01/13/20 0141 01/13/20 0601 01/13/20 0944  NA 106*   < > 109*   < > 115* 116* 116*  K 3.6  --  3.5  --   --  3.3*  --   CL 75*  --  76*   --   --  82*  --   CO2 20*  --  21*  --   --  24  --   GLUCOSE 128*  --  99  --   --  83  --   BUN 8  --  7*  --   --  5*  --   CREATININE 0.58*  --  0.54*  --   --  0.58*  --   CALCIUM 8.2*  --  8.1*  --   --  8.1*  --    < > = values in this interval not displayed.   LFT Recent Labs    01/11/20 1211 01/13/20 0601  PROT 7.0 6.1*  ALBUMIN 3.5 3.0*  AST 79* 35  ALT 26 21  ALKPHOS 60 54  BILITOT 1.2 1.3*   PT/INR No results for input(s): LABPROT, INR in the last 72 hours. Hepatitis Panel Recent Labs    01/12/20 1200  HEPBSAG NON REACTIVE  HCVAB NON REACTIVE  HEPAIGM NON REACTIVE  HEPBIGM NON REACTIVE     Studies/Results: US Abdomen Complete  Result Date: 01/13/2020 CLINICAL DATA:  Abnormal liver function tests. EXAM: ABDOMEN ULTRASOUND COMPLETE COMPARISON:  None. FINDINGS: Gallbladder: No gallstones or wall thickening visualized. No sonographic Murphy sign noted by sonographer. Common bile duct: Diameter: 3 mm Liver: Moderately increased parenchymal echogenicity diffusely without a focal lesion identified. Portal vein is patent on color Doppler imaging with normal direction of blood  flow towards the liver. IVC: No abnormality visualized. Pancreas: Visualized portion unremarkable. Spleen: Size and appearance within normal limits. Right Kidney: Length: 12.6 cm. Echogenicity within normal limits. No mass or hydronephrosis visualized. Left Kidney: Length: 11.5 cm. Echogenicity within normal limits. No mass or hydronephrosis visualized. Abdominal aorta: No aneurysm visualized with the aortic bifurcation being obscured. Other findings: None. IMPRESSION: 1. Echogenic liver, nonspecific though often seen with steatosis. 2. Otherwise unremarkable abdominal ultrasound. Electronically Signed   By: Sebastian Ache M.D.   On: 01/13/2020 14:20   DG Chest Port 1 View  Result Date: 01/11/2020 CLINICAL DATA:  64 year old male with weakness for 3 months. Left side facial droop. EXAM: PORTABLE  CHEST 1 VIEW COMPARISON:  Chest CT 07/03/2019. FINDINGS: Portable AP semi upright view at 1614 hours. Large lung volumes. Mediastinal contours are within normal limits. Visualized tracheal air column is within normal limits. Allowing for portable technique the lungs are clear. No pneumothorax. No acute osseous abnormality identified. Chronic right lower rib fractures. IMPRESSION: Pulmonary hyperinflation.  No acute cardiopulmonary abnormality. Electronically Signed   By: Odessa Fleming M.D.   On: 01/11/2020 16:23    Impression: *Suspected upper GI bleeding/melena-Hemoccult positive *Hyponatremia-due to beer potomania, improving *Abdominal pain-mild, improved *Abnormal LFTs-likely due to chronic alcohol abuse, improved  Plan: Etiology of patient's upper GI bleeding unclear. Differential includes gastritis, peptic ulcer disease,Dieulafoylesion, AVMs, malignancy, or other.  I recommended EGD to further evaluate to patient and he agrees. I discussed case with anesthesia who wants patient's sodium to be at least 130 prior to proceeding.  Discussed that if he has evidence of further bleeding that would deem this procedure urgent/emergent, we may have to reevaluate prior.  Patient understands.  We will need to perform colonoscopy in the outpatient setting as patient has never had one prior.  The risks including infection, bleed, or perforation as well as benefits, limitations, alternatives and imponderables have been reviewed with the patient. Potential for esophageal dilation, biopsy, etc. have also been reviewed.  Questions have been answered. All parties agreeable.  Continue on Protonix IV 40 mg twice daily. Okay to advance to full liquids.   For his abnormal LFTs, this is likely due to alcohol. US shows fatty liver, viral hepatitis labs pending  Patient is already on alcohol withdrawal precautions. Receiving Thiamine. We will continue to monitor.  Hennie Duos. Marletta Lor, D.O. Gastroenterology and  Hepatology [facility], [facility]   LOS: 2 days    01/13/2020, 2:45 PM

## 2020-01-14 DIAGNOSIS — E871 Hypo-osmolality and hyponatremia: Secondary | ICD-10-CM | POA: Diagnosis not present

## 2020-01-14 DIAGNOSIS — F1010 Alcohol abuse, uncomplicated: Secondary | ICD-10-CM | POA: Diagnosis not present

## 2020-01-14 DIAGNOSIS — K921 Melena: Secondary | ICD-10-CM

## 2020-01-14 DIAGNOSIS — R945 Abnormal results of liver function studies: Secondary | ICD-10-CM

## 2020-01-14 DIAGNOSIS — R7989 Other specified abnormal findings of blood chemistry: Secondary | ICD-10-CM

## 2020-01-14 DIAGNOSIS — K922 Gastrointestinal hemorrhage, unspecified: Secondary | ICD-10-CM | POA: Diagnosis not present

## 2020-01-14 LAB — BASIC METABOLIC PANEL
Anion gap: 10 (ref 5–15)
BUN: <5 mg/dL — ABNORMAL LOW (ref 8–23)
CO2: 23 mmol/L (ref 22–32)
Calcium: 8.2 mg/dL — ABNORMAL LOW (ref 8.9–10.3)
Chloride: 87 mmol/L — ABNORMAL LOW (ref 98–111)
Creatinine, Ser: 0.57 mg/dL — ABNORMAL LOW (ref 0.61–1.24)
GFR, Estimated: >60 mL/min (ref >60)
Glucose, Bld: 99 mg/dL (ref 70–99)
Potassium: 3.5 mmol/L (ref 3.5–5.1)
Sodium: 120 mmol/L — ABNORMAL LOW (ref 135–145)

## 2020-01-14 LAB — SODIUM
Sodium: 119 mmol/L — CL (ref 135–145)
Sodium: 123 mmol/L — ABNORMAL LOW (ref 135–145)
Sodium: 122 mmol/L — ABNORMAL LOW (ref 135–145)
Sodium: 121 mmol/L — ABNORMAL LOW (ref 135–145)
Sodium: 124 mmol/L — ABNORMAL LOW (ref 135–145)

## 2020-01-14 LAB — MAGNESIUM: Magnesium: 1.8 mg/dL (ref 1.7–2.4)

## 2020-01-14 NOTE — Plan of Care (Signed)
°  Problem: Acute Rehab PT Goals(only PT should resolve) Goal: Pt Will Go Supine/Side To Sit Outcome: Progressing Flowsheets (Taken 01/14/2020 1359) Pt will go Supine/Side to Sit:  with modified independence  with supervision Goal: Patient Will Transfer Sit To/From Stand Outcome: Progressing Flowsheets (Taken 01/14/2020 1359) Patient will transfer sit to/from stand: with min guard assist Goal: Pt Will Transfer Bed To Chair/Chair To Bed Outcome: Progressing Flowsheets (Taken 01/14/2020 1359) Pt will Transfer Bed to Chair/Chair to Bed: min guard assist Goal: Pt Will Ambulate Outcome: Progressing Flowsheets (Taken 01/14/2020 1359) Pt will Ambulate:  50 feet  with minimal assist  with rolling walker   2:00 PM, 01/14/20 Ocie Bob, MPT Physical Therapist with [facility] [phone] office [phone] mobile phone

## 2020-01-14 NOTE — Progress Notes (Signed)
Subjective: Sleeping in chair when I entered room. Awakened easily. No abdominal pain, N/V. No overt GI bleeding. Tolerating diet.   Objective: Vital signs in last 24 hours: Temp:  [97.7 F (36.5 C)-98.7 F (37.1 C)] 98 F (36.7 C) (10/18 0726) Pulse Rate:  [66-91] 89 (10/18 0730) Resp:  [15-27] 24 (10/18 0730) BP: (114-161)/(68-104) 148/78 (10/18 0700) SpO2:  [85 %-93 %] 93 % (10/18 0730) Weight:  [74.9 kg] 74.9 kg (10/18 0400) Last BM Date: 01/11/20 General:   Sleeping in chair beside bed when entering room. Easily awakened.  Abdomen:  Bowel sounds present, soft, non-tender, non-distended. Limited as patient sitting in chair Extremities:  Without  edema. Neurologic:  oriented to person and year. Confused regarding city but reoriented.    Intake/Output from previous day: 10/17 0701 - 10/18 0700 In: 2363.7 [P.O.:1000; I.V.:1363.7] Out: 800 [Urine:800] Intake/Output this shift: No intake/output data recorded.  Lab Results: Recent Labs    01/11/20 1211 01/12/20 0609 01/13/20 0601  WBC 13.8* 9.8 8.7  HGB 14.2 13.5 13.4  HCT RESULTS UNAVAILABLE DUE TO INTERFERING SUBSTANCE 37.4* 36.6*  PLT 220 195 193   BMET Recent Labs    01/11/20 1533 01/11/20 1633 01/13/20 0601 01/13/20 0944 01/13/20 1827 01/13/20 2259 01/14/20 0515  NA 109*   < > 116*   < > 118* 119* 120*  K 3.5  --  3.3*  --   --   --  3.5  CL 76*  --  82*  --   --   --  87*  CO2 21*  --  24  --   --   --  23  GLUCOSE 99  --  83  --   --   --  99  BUN 7*  --  5*  --   --   --  <5*  CREATININE 0.54*  --  0.58*  --   --   --  0.57*  CALCIUM 8.1*  --  8.1*  --   --   --  8.2*   < > = values in this interval not displayed.   LFT Recent Labs    01/11/20 1211 01/13/20 0601  PROT 7.0 6.1*  ALBUMIN 3.5 3.0*  AST 79* 35  ALT 26 21  ALKPHOS 60 54  BILITOT 1.2 1.3*    Hepatitis Panel Recent Labs    01/12/20 1200  HEPBSAG NON REACTIVE  HCVAB NON REACTIVE  HEPAIGM NON REACTIVE  HEPBIGM NON  REACTIVE    Studies/Results: US Abdomen Complete  Result Date: 01/13/2020 CLINICAL DATA:  Abnormal liver function tests. EXAM: ABDOMEN ULTRASOUND COMPLETE COMPARISON:  None. FINDINGS: Gallbladder: No gallstones or wall thickening visualized. No sonographic Murphy sign noted by sonographer. Common bile duct: Diameter: 3 mm Liver: Moderately increased parenchymal echogenicity diffusely without a focal lesion identified. Portal vein is patent on color Doppler imaging with normal direction of blood flow towards the liver. IVC: No abnormality visualized. Pancreas: Visualized portion unremarkable. Spleen: Size and appearance within normal limits. Right Kidney: Length: 12.6 cm. Echogenicity within normal limits. No mass or hydronephrosis visualized. Left Kidney: Length: 11.5 cm. Echogenicity within normal limits. No mass or hydronephrosis visualized. Abdominal aorta: No aneurysm visualized with the aortic bifurcation being obscured. Other findings: None. IMPRESSION: 1. Echogenic liver, nonspecific though often seen with steatosis. 2. Otherwise unremarkable abdominal ultrasound. Electronically Signed   By: Sebastian Ache M.D.   On: 01/13/2020 14:20    Assessment: 64 year old male with history of alcohol abuse, presenting with  generalized weakness to ED, dark stool that was heme positive but Hgb stable. Mobic prior to admission. Plans for EGD on hold until hyponatremia improved. No overt GI bleeding.   Hyponatremia: profound on admission and due to beer potomania. Sodium 123 today. Per notes, anesthesia recommending sodium of 130 prior to EGD. Nephrology following.   Elevated LFTs: Isolated elevation of AST at 79 on admission. fatty liver, spleen normal on ultrasound. Viral serologies negative. Transaminases normalized as of yesterday. Likely due to ETOH/fatty liver.   Plan: Advance to soft diet PPI BID Monitor for further overt GI bleeding Will reassess in the morning EGD when hyponatremia  improved   Gelene Mink, PhD, ANP-BC [facility]    LOS: 3 days    01/14/2020, 8:17 AM

## 2020-01-14 NOTE — Progress Notes (Signed)
Patient ID: Barry Fowler, male   DOB: [birth year], 64 y.o.   MRN: [id number] S: Sodium levels has been improving over the weekend with IVF's.  O:BP (!) 148/78   Pulse 89   Temp 98 F (36.7 C) (Oral)   Resp (!) 24   Ht 6' (1.829 m)   Wt 74.9 kg   SpO2 93%   BMI 22.39 kg/m   Intake/Output Summary (Last 24 hours) at 01/14/2020 0902 Last data filed at 01/13/2020 1832 Gross per 24 hour  Intake 1357.6 ml  Output 800 ml  Net 557.6 ml   Intake/Output: I/O last 3 completed shifts: In: 2363.7 [P.O.:1000; I.V.:1363.7] Out: 800 [Urine:800]  Intake/Output this shift:  No intake/output data recorded. Weight change:  Gen: somnolent but responds to voice and oriented to person and place.   CVS: RRR, no rub Resp: occ rhonchi bilaterally, transmitted upper airway sounds Abd: +BS, soft, NT/ND Ext: no edema  Recent Labs  Lab 01/11/20 1211 01/11/20 1211 01/11/20 1533 01/11/20 1633 01/13/20 0141 01/13/20 0601 01/13/20 0944 01/13/20 1342 01/13/20 1827 01/13/20 2259 01/14/20 0515  NA 106*   < > 109*   < > 115* 116* 116* 116* 118* 119* 120*  K 3.6  --  3.5  --   --  3.3*  --   --   --   --  3.5  CL 75*  --  76*  --   --  82*  --   --   --   --  87*  CO2 20*  --  21*  --   --  24  --   --   --   --  23  GLUCOSE 128*  --  99  --   --  83  --   --   --   --  99  BUN 8  --  7*  --   --  5*  --   --   --   --  <5*  CREATININE 0.58*  --  0.54*  --   --  0.58*  --   --   --   --  0.57*  ALBUMIN 3.5  --   --   --   --  3.0*  --   --   --   --   --   CALCIUM 8.2*  --  8.1*  --   --  8.1*  --   --   --   --  8.2*  AST 79*  --   --   --   --  35  --   --   --   --   --   ALT 26  --   --   --   --  21  --   --   --   --   --    < > = values in this interval not displayed.   Liver Function Tests: Recent Labs  Lab 01/11/20 1211 01/13/20 0601  AST 79* 35  ALT 26 21  ALKPHOS 60 54  BILITOT 1.2 1.3*  PROT 7.0 6.1*  ALBUMIN 3.5 3.0*   No results for input(s): LIPASE, AMYLASE in the last 168  hours. Recent Labs  Lab 01/11/20 1356  AMMONIA 24   CBC: Recent Labs  Lab 01/11/20 1211 01/12/20 0609 01/13/20 0601  WBC 13.8* 9.8 8.7  NEUTROABS 11.9*  --   --   HGB 14.2 13.5 13.4  HCT RESULTS UNAVAILABLE DUE TO INTERFERING SUBSTANCE 37.4* 36.6*  MCV RESULTS UNAVAILABLE DUE TO INTERFERING SUBSTANCE 88.8 87.8  PLT 220 195 193   Cardiac Enzymes: No results for input(s): CKTOTAL, CKMB, CKMBINDEX, TROPONINI in the last 168 hours. CBG: No results for input(s): GLUCAP in the last 168 hours.  Iron Studies: No results for input(s): IRON, TIBC, TRANSFERRIN, FERRITIN in the last 72 hours. Studies/Results: US Abdomen Complete  Result Date: 01/13/2020 CLINICAL DATA:  Abnormal liver function tests. EXAM: ABDOMEN ULTRASOUND COMPLETE COMPARISON:  None. FINDINGS: Gallbladder: No gallstones or wall thickening visualized. No sonographic Murphy sign noted by sonographer. Common bile duct: Diameter: 3 mm Liver: Moderately increased parenchymal echogenicity diffusely without a focal lesion identified. Portal vein is patent on color Doppler imaging with normal direction of blood flow towards the liver. IVC: No abnormality visualized. Pancreas: Visualized portion unremarkable. Spleen: Size and appearance within normal limits. Right Kidney: Length: 12.6 cm. Echogenicity within normal limits. No mass or hydronephrosis visualized. Left Kidney: Length: 11.5 cm. Echogenicity within normal limits. No mass or hydronephrosis visualized. Abdominal aorta: No aneurysm visualized with the aortic bifurcation being obscured. Other findings: None. IMPRESSION: 1. Echogenic liver, nonspecific though often seen with steatosis. 2. Otherwise unremarkable abdominal ultrasound. Electronically Signed   By: Sebastian Ache M.D.   On: 01/13/2020 14:20   . Chlorhexidine Gluconate Cloth  6 each Topical Daily  . folic acid  1 mg Oral Daily  . heparin  5,000 Units Subcutaneous Q8H  . multivitamin with minerals  1 tablet Oral Daily  .  nicotine  21 mg Transdermal Daily  . pantoprazole  40 mg Intravenous Q12H  . thiamine  100 mg Oral Daily   Or  . thiamine  100 mg Intravenous Daily    BMET    Component Value Date/Time   NA 120 (L) 01/14/2020 0515   NA 136 03/24/2013 2105   K 3.5 01/14/2020 0515   K 4.1 03/24/2013 2105   CL 87 (L) 01/14/2020 0515   CL 105 03/24/2013 2105   CO2 23 01/14/2020 0515   CO2 23 03/24/2013 2105   GLUCOSE 99 01/14/2020 0515   GLUCOSE 109 (H) 03/24/2013 2105   BUN <5 (L) 01/14/2020 0515   BUN 13 03/24/2013 2105   CREATININE 0.57 (L) 01/14/2020 0515   CREATININE 0.75 03/24/2013 2105   CALCIUM 8.2 (L) 01/14/2020 0515   CALCIUM 8.8 03/24/2013 2105   GFRNONAA >60 01/14/2020 0515   GFRNONAA >60 03/24/2013 2105   GFRAA >60 03/24/2013 2105   CBC    Component Value Date/Time   WBC 8.7 01/13/2020 0601   RBC 4.17 (L) 01/13/2020 0601   HGB 13.4 01/13/2020 0601   HGB 14.9 03/24/2013 2105   HCT 36.6 (L) 01/13/2020 0601   HCT 45.9 03/24/2013 2105   PLT 193 01/13/2020 0601   PLT 243 03/24/2013 2105   MCV 87.8 01/13/2020 0601   MCV 91 03/24/2013 2105   MCH 32.1 01/13/2020 0601   MCHC 36.6 (H) 01/13/2020 0601   RDW 11.9 01/13/2020 0601   RDW 13.8 03/24/2013 2105   LYMPHSABS 0.6 (L) 01/11/2020 1211   MONOABS 1.4 (H) 01/11/2020 1211   EOSABS 0.0 01/11/2020 1211   BASOSABS 0.0 01/11/2020 1211     Assessment/Plan:  1. Hyponatremia, severe- Uosm 211, Sosm 225, Urine Na 15 consistent with poor solute intake and beer potomania.  Improving with IV normal saline and is now up to 120 (from 106 on 01/11/20).  ARB held.  Continue with IVf's and follow sodium levels.   2. Acute metabolic encephalopathy- CT scan  of head negative.  Slowly improving.   3. Alcohol abuse- continue with CIWA protocol per primary 4. HTN- actually had hypotension and meds on hold.  BP stable for now. 5. Heme + stools per primary.  Hgb stable.   Irena Cords, MD [employer] [phone]

## 2020-01-14 NOTE — Evaluation (Signed)
Physical Therapy Evaluation Patient Details Name: Barry Fowler MRN: [id number] DOB: 14-May-1955 Today's Date: 01/14/2020   History of Present Illness  Barry Fowler  is a 64 y.o. male, with past medical history of hypertension, hyperlipidemia, tobacco abuse, 1 pack/day for last 30 years, sister at bedside assist with the history, patient was started last month on losartan for high blood pressure, patient with heavy alcohol abuse, up to 12 beers per day, patient presents to ED secondary couple weeks of generalized weakness, as well for last few days he had diminished appetite, and there was some questionable left-sided facial droop, as well as some nausea, and one episode of dark bowel movement, which prompted family to call EMS, patient lives with his nephew, patient denies any chest pain, denies any shortness of breath, headache, abdominal pain, vomiting, coffee-ground emesis.    Clinical Impression  Patient presents lethargic, able to participate with frequent verbal/tactile cueing, demonstrates slow labored movement for functional activity, very unsteady and at high risk for falls due to generalized weakness and poor standing balance.  Patient limited to a few steps in room before fatiguing, leaning backwards with near loss of balance prevented by Clinical research associate.  Patient tolerated sitting up in chair after therapy - RN notified.  Patient will benefit from continued physical therapy in hospital and recommended venue below to increase strength, balance, endurance for safe ADLs and gait.     Follow Up Recommendations SNF;Supervision for mobility/OOB;Supervision/Assistance - 24 hour    Equipment Recommendations  Rolling walker with 5" wheels    Recommendations for Other Services       Precautions / Restrictions Precautions Precautions: Fall Restrictions Weight Bearing Restrictions: No      Mobility  Bed Mobility Overal bed mobility: Needs Assistance Bed Mobility: Supine to Sit     Supine to  sit: Min guard     General bed mobility comments: increased time, labored movement  Transfers Overall transfer level: Needs assistance Equipment used: Rolling walker (2 wheeled) Transfers: Sit to/from [employer] Sit to Stand: Min assist;Mod assist Stand pivot transfers: Mod assist       General transfer comment: slow labored movement, unsteady on feet  Ambulation/Gait Ambulation/Gait assistance: Mod assist Gait Distance (Feet): 10 Feet Assistive device: Rolling walker (2 wheeled) Gait Pattern/deviations: Decreased step length - right;Decreased step length - left;Decreased stride length Gait velocity: dereased   General Gait Details: slow labored movement with frequent leaning backwards due to generalized weakness, poor standing balance  Stairs            Wheelchair Mobility    Modified Rankin (Stroke Patients Only)       Balance Overall balance assessment: Needs assistance Sitting-balance support: Feet supported;No upper extremity supported Sitting balance-Leahy Scale: Fair Sitting balance - Comments: sitting EOB   Standing balance support: Bilateral upper extremity supported;During functional activity Standing balance-Leahy Scale: Poor Standing balance comment: using RW                             Pertinent Vitals/Pain Pain Assessment: No/denies pain    Home Living Family/patient expects to be discharged to:: Private residence Living Arrangements: Other relatives Available Help at Discharge: Family;Available 24 hours/day Type of Home: House Home Access: Ramped entrance     Home Layout: One level Home Equipment: Shower seat;Cane - quad;Wheelchair - manual      Prior Function Level of Independence: Independent with assistive device(s)         Comments:  Household ambulator using quad-cane, uses wheelchair for longer disances     Hand Dominance   Dominant Hand: Right    Extremity/Trunk Assessment   Upper  Extremity Assessment Upper Extremity Assessment: Defer to OT evaluation    Lower Extremity Assessment Lower Extremity Assessment: Generalized weakness    Cervical / Trunk Assessment Cervical / Trunk Assessment: Normal  Communication   Communication: Other (comment) (difficult to understand at times due to lethargy)  Cognition Arousal/Alertness: Lethargic Behavior During Therapy: Flat affect Overall Cognitive Status: No family/caregiver present to determine baseline cognitive functioning                                        General Comments      Exercises     Assessment/Plan    PT Assessment Patient needs continued PT services  PT Problem List Decreased strength;Decreased activity tolerance;Decreased balance;Decreased mobility;Decreased knowledge of use of DME       PT Treatment Interventions Balance training;Gait training;Stair training;Functional mobility training;Therapeutic activities;Therapeutic exercise;Patient/family education    PT Goals (Current goals can be found in the Care Plan section)  Acute Rehab PT Goals Patient Stated Goal: return home with family to assist PT Goal Formulation: With patient Time For Goal Achievement: 01/28/20 Potential to Achieve Goals: Good    Frequency Min 3X/week   Barriers to discharge        Co-evaluation PT/OT/SLP Co-Evaluation/Treatment: Yes Reason for Co-Treatment: Complexity of the patient's impairments (multi-system involvement) PT goals addressed during session: Mobility/safety with mobility;Proper use of DME;Strengthening/ROM         AM-PAC PT "6 Clicks" Mobility  Outcome Measure Help needed turning from your back to your side while in a flat bed without using bedrails?: A Little Help needed moving from lying on your back to sitting on the side of a flat bed without using bedrails?: A Little Help needed moving to and from a bed to a chair (including a wheelchair)?: A Lot Help needed standing up  from a chair using your arms (e.g., wheelchair or bedside chair)?: A Lot Help needed to walk in hospital room?: A Lot Help needed climbing 3-5 steps with a railing? : Total 6 Click Score: 13    End of Session   Activity Tolerance: Patient tolerated treatment well;Patient limited by fatigue Patient left: in chair;with call bell/phone within reach;with chair alarm set Nurse Communication: Mobility status PT Visit Diagnosis: Unsteadiness on feet (R26.81);Other abnormalities of gait and mobility (R26.89);Muscle weakness (generalized) (M62.81)    Time: 8032-1224 PT Time Calculation (min) (ACUTE ONLY): 31 min   Charges:   PT Evaluation $PT Eval Moderate Complexity: 1 Mod PT Treatments $Therapeutic Activity: 23-37 mins        1:57 PM, 01/14/20 Ocie Bob, MPT Physical Therapist with [facility] [phone] office [phone] mobile phone

## 2020-01-14 NOTE — Evaluation (Signed)
Occupational Therapy Evaluation Patient Details Name: Barry Fowler MRN: [id number] DOB: 04/12/1955 Today's Date: 01/14/2020    History of Present Illness Barry Fowler  is a 64 y.o. male, with past medical history of hypertension, hyperlipidemia, tobacco abuse, 1 pack/day for last 30 years, sister at bedside assist with the history, patient was started last month on losartan for high blood pressure, patient with heavy alcohol abuse, up to 12 beers per day, patient presents to ED secondary couple weeks of generalized weakness, as well for last few days he had diminished appetite, and there was some questionable left-sided facial droop, as well as some nausea, and one episode of dark bowel movement, which prompted family to call EMS, patient lives with his nephew, patient denies any chest pain, denies any shortness of breath, headache, abdominal pain, vomiting, coffee-ground emesis.   Clinical Impression   Patient in bed upon therapy arrival and agreeable to participate in OT evaluation. Patient received Ativan per nursing and he was lethargic during evaluation although was able to answer all questions appropriately and completed functional transfer with PT. Patient did demonstrate decreased standing balance while using RW which may have been related to medication. At this time, patient demonstrates BUE weakness and sitting/standing balance causing an increase risk for falls and requires assistance to complete ADL tasks. Recommend patient discharge to SNF at this point to focus on mentioned deficits. OT will continue to follow patient acutely and update discharge recommendation as needed.     Follow Up Recommendations  SNF    Equipment Recommendations  None recommended by OT       Precautions / Restrictions Precautions Precautions: Fall Restrictions Weight Bearing Restrictions: No      Mobility Bed Mobility Overal bed mobility: Modified Independent    Transfers      General transfer  comment: see PT evaluation    Balance Overall balance assessment: Needs assistance Sitting-balance support: Feet supported;Single extremity supported Sitting balance-Leahy Scale: Fair Sitting balance - Comments: sitting EOB   Standing balance support: Bilateral upper extremity supported;During functional activity Standing balance-Leahy Scale: Poor         ADL either performed or assessed with clinical judgement   ADL Overall ADL's : Needs assistance/impaired   Eating/Feeding Details (indicate cue type and reason): not assessed Grooming: Wash/dry hands;Wash/dry face;Set up;Cueing for sequencing   Upper Body Bathing: Minimal assistance;Sitting   Lower Body Bathing: Maximal assistance;Sitting/lateral leans;Sit to/from stand   Upper Body Dressing : Minimal assistance;Sitting   Lower Body Dressing: Maximal assistance;Sit to/from stand;Sitting/lateral leans     Toilet Transfer Details (indicate cue type and reason): Completed with PT. See PT evaluation                 Vision Baseline Vision/History:  (unknown) Patient Visual Report:  (unknown) Vision Assessment?:  (not tested)         Hand Dominance Right   Extremity/Trunk Assessment Upper Extremity Assessment Upper Extremity Assessment: Generalized weakness   Lower Extremity Assessment Lower Extremity Assessment: Defer to PT evaluation       Communication Communication Communication: Other (comment) (slurred speech due to medication effects)   Cognition Arousal/Alertness: Lethargic Behavior During Therapy: Flat affect Overall Cognitive Status: No family/caregiver present to determine baseline cognitive functioning                    Home Living Family/patient expects to be discharged to:: Skilled nursing facility Living Arrangements: Other relatives (nephew) Available Help at Discharge: Family;Available 24 hours/day Type of Home: House  Home Access: Ramped entrance     Home Layout: One level                Home Equipment: Shower seat;Cane - quad;Wheelchair - manual          Prior Functioning/Environment Level of Independence: Independent with assistive device(s)                 OT Problem List: Decreased strength;Decreased activity tolerance;Impaired UE functional use;Impaired balance (sitting and/or standing)      OT Treatment/Interventions: Self-care/ADL training;Therapeutic exercise;Therapeutic activities;Neuromuscular education;DME and/or AE instruction;Patient/family education;Manual therapy;Modalities;Balance training    OT Goals(Current goals can be found in the care plan section) Acute Rehab OT Goals Patient Stated Goal: none stated OT Goal Formulation: With patient Time For Goal Achievement: 01/28/20 Potential to Achieve Goals: Good  OT Frequency: Min 2X/week           Co-evaluation PT/OT/SLP Co-Evaluation/Treatment: Yes Reason for Co-Treatment: For patient/therapist safety;To address functional/ADL transfers   OT goals addressed during session: ADL's and self-care;Strengthening/ROM;Proper use of Adaptive equipment and DME      AM-PAC OT "6 Clicks" Daily Activity     Outcome Measure Help from another person eating meals?: A Lot Help from another person taking care of personal grooming?: A Lot Help from another person toileting, which includes using toliet, bedpan, or urinal?: A Lot Help from another person bathing (including washing, rinsing, drying)?: A Lot Help from another person to put on and taking off regular upper body clothing?: A Lot Help from another person to put on and taking off regular lower body clothing?: A Lot 6 Click Score: 12   End of Session Equipment Utilized During Treatment: Rolling walker  Activity Tolerance: Patient limited by lethargy Patient left: in chair;with call bell/phone within reach;with chair alarm set  OT Visit Diagnosis: Muscle weakness (generalized) (M62.81)                Time: 6629-4765 OT Time  Calculation (min): 18 min Charges:  OT General Charges $OT Visit: 1 Visit OT Evaluation $OT Eval Low Complexity: 1 Low  Limmie Patricia, OTR/L,CBIS  [phone]   Tamantha Saline, Charisse March 01/14/2020, 9:48 AM

## 2020-01-14 NOTE — TOC Progression Note (Signed)
Transition of Care [facility]) - Progression Note    Patient Details  Name: Barry Fowler MRN: [id number] Date of Birth: 22-Oct-1955  Transition of Care [facility]) CM/SW Contact  Barry Brunner, LCSW Phone Number: 01/14/2020, 3:32 PM  Clinical Narrative:    CSW received SNF recommendation. CSW consulted with patient about preferred SNF's for referrals. Patient agreeable to [facility] SNF referrals. Patient not agreeable to [place] location due to proximity to his residence. Patient reported that he lives in [place]. CSW completed PASSR,FL2 and faxed referral to [facility]. TOC to follow.   Expected Discharge Plan: Skilled Nursing Facility Barriers to Discharge: Continued Medical Work up  Expected Discharge Plan and Services Expected Discharge Plan: Skilled Nursing Facility       Living arrangements for the past 2 months: Single Family Home                                       Social Determinants of Health (SDOH) Interventions    Readmission Risk Interventions No flowsheet data found.

## 2020-01-14 NOTE — NC FL2 (Signed)
[place] MEDICAID FL2 LEVEL OF CARE SCREENING TOOL     IDENTIFICATION  Patient Name: Barry Fowler Birthdate: 21-Mar-1956 Sex: male Admission Date (Current Location): 01/11/2020  [facility] and [place] Number:  [employer] and Address:  [facility] [place], [address] [place], [place] [address]      Provider Number:    Attending Physician Name and Address:  Erick Blinks, MD  Relative Name and Phone Number:  Daiva Eves (Mother) [phone]    Current Level of Care: Hospital Recommended Level of Care: Skilled Nursing Facility Prior Approval Number:    Date Approved/Denied: 01/14/20 PASRR Number: [id number]  Discharge Plan: SNF    Current Diagnoses: Patient Active Problem List   Diagnosis Date Noted  . Abnormal LFTs (liver function tests)   . Melena   . Hyponatremia 01/11/2020  . Alcohol abuse 01/11/2020    Orientation RESPIRATION BLADDER Height & Weight     Self, Time, Situation, Place  Normal Incontinent, External catheter Weight: 165 lb 2 oz (74.9 kg) Height:  6' (182.9 cm)  BEHAVIORAL SYMPTOMS/MOOD NEUROLOGICAL BOWEL NUTRITION STATUS      Continent Diet  AMBULATORY STATUS COMMUNICATION OF NEEDS Skin   Extensive Assist Verbally Skin abrasions (Back)                       Personal Care Assistance Level of Assistance  Bathing, Feeding, Dressing Bathing Assistance: Limited assistance Feeding assistance: Limited assistance Dressing Assistance: Limited assistance     Functional Limitations Info  Sight, Hearing, Speech Sight Info: Adequate Hearing Info: Adequate Speech Info: Adequate    SPECIAL CARE FACTORS FREQUENCY  PT (By licensed PT)     PT Frequency: 5x              Contractures Contractures Info: Not present    Additional Factors Info  Code Status, Allergies Code Status Info: Full Allergies Info: 5x           Current Medications (01/14/2020):  This is the current hospital active medication  list Current Facility-Administered Medications  Medication Dose Route Frequency Provider Last Rate Last Admin  . 0.9 %  sodium chloride infusion   Intravenous Continuous Erick Blinks, MD 75 mL/hr at 01/14/20 0628 New Bag at 01/14/20 3710  . Chlorhexidine Gluconate Cloth 2 % PADS 6 each  6 each Topical Daily Elgergawy, Leana Roe, MD   6 each at 01/14/20 1116  . folic acid (FOLVITE) tablet 1 mg  1 mg Oral Daily Elgergawy, Leana Roe, MD   1 mg at 01/14/20 0946  . guaiFENesin-dextromethorphan (ROBITUSSIN DM) 100-10 MG/5ML syrup 10 mL  10 mL Oral Q4H PRN Elgergawy, Leana Roe, MD   10 mL at 01/12/20 2057  . heparin injection 5,000 Units  5,000 Units Subcutaneous Q8H Elgergawy, Leana Roe, MD   5,000 Units at 01/14/20 1350  . LORazepam (ATIVAN) tablet 1-4 mg  1-4 mg Oral Q1H PRN Elgergawy, Leana Roe, MD       Or  . LORazepam (ATIVAN) injection 1-4 mg  1-4 mg Intravenous Q1H PRN Elgergawy, Leana Roe, MD   4 mg at 01/14/20 0329  . multivitamin with minerals tablet 1 tablet  1 tablet Oral Daily Elgergawy, Leana Roe, MD   1 tablet at 01/14/20 0946  . nicotine (NICODERM CQ - dosed in mg/24 hours) patch 21 mg  21 mg Transdermal Daily Elgergawy, Leana Roe, MD   21 mg at 01/14/20 0958  . pantoprazole (PROTONIX) injection 40 mg  40  mg Intravenous Q12H Erick Blinks, MD   40 mg at 01/14/20 1000  . thiamine tablet 100 mg  100 mg Oral Daily Elgergawy, Leana Roe, MD   100 mg at 01/14/20 0946   Or  . thiamine (B-1) injection 100 mg  100 mg Intravenous Daily Elgergawy, Leana Roe, MD         Discharge Medications: Please see discharge summary for a list of discharge medications.  Relevant Imaging Results:  Relevant Lab Results:   Additional Information PT SSN: [id number]  Barry Brunner, LCSW

## 2020-01-14 NOTE — Progress Notes (Signed)
Critical sodium of 119 called by lab at 0119. Value has improved over last which was 118.   MD notified via text page on amion

## 2020-01-14 NOTE — Progress Notes (Signed)
PROGRESS NOTE    Barry Fowler  NUU:[id number] DOB: 1955/11/07 DOA: 01/11/2020 PCP: Patient, No Pcp Per    Brief Narrative:   Barry Fowler  is a 64 y.o. male, with past medical history of hypertension, hyperlipidemia, tobacco abuse, 1 pack/day for last 30 years, sister at bedside assist with the history, patient was started last month on losartan for high blood pressure, patient with heavy alcohol abuse, up to 12 beers per day, patient presents to ED secondary couple weeks of generalized weakness, as well for last few days he had diminished appetite, and there was some questionable left-sided facial droop, as well as some nausea, and one episode of dark bowel movement, which prompted family to call EMS, patient lives with his nephew, patient denies any chest pain, denies any shortness of breath, headache, abdominal pain, vomiting, coffee-ground emesis. - in ED patient was noted to have sodium of 106, cultures pending, but hemoglobin is stable at 13.2, his potassium is 3.6, CT head with no acute findings, COVID-19 test has been negative, UA with no acute finding, negative urine drug screen, UA and chest x-ray is pending.   Assessment & Plan:   Active Problems:   Hyponatremia   Alcohol abuse   Abnormal LFTs (liver function tests)   Melena   Hyponatremia -Likely related to beer drinkers potomania -Sodium levels are responding to saline and are slowly trending up. -Appreciate nephrology assistance  Acute metabolic encephalopathy -CT head negative -Likely secondary to above -Appears to be improving  Alcohol abuse -No signs of withdrawal at this time -Continue on CIWA protocol  Hypertension -Losartan currently on hold -Blood pressures are stable -Continue to monitor  Hyperlipidemia -Resume statin when stable  GI bleeding -Patient noted to have dark-colored stools that were heme positive -He was taking Mobic prior to admission -Seen by GI with plans for endoscopy once serum  sodium greater than 130 -Continue on Protonix -Per GI, started on soft diet  Elevated LFTs -Abdominal ultrasound negative -Although suspect this is related to alcohol -Viral hepatitis panel negative  DVT prophylaxis: heparin injection 5,000 Units Start: 01/11/20 1645 SCDs Start: 01/11/20 1637  Code Status: Full code Family Communication: Discussed with patient Disposition Plan: Status is: Inpatient  Remains inpatient appropriate because:IV treatments appropriate due to intensity of illness or inability to take PO   Dispo: The patient is from: Home              Anticipated d/c is to: Home              Anticipated d/c date is: 3 days              Patient currently is not medically stable to d/c.   Consultants:   Nephrology  Gastroenterology  Procedures:     Antimicrobials:       Subjective: No chest pain, nausea or vomiting.  No shortness of breath.  Objective: Vitals:   01/14/20 1600 01/14/20 1635 01/14/20 1700 01/14/20 1800  BP: 127/86   124/85  Pulse: 94  71   Resp: 20 17 13 18   Temp:  98.3 F (36.8 C)    TempSrc:  Oral    SpO2: 94%  (!) 60%   Weight:      Height:        Intake/Output Summary (Last 24 hours) at 01/14/2020 1853 Last data filed at 01/14/2020 1705 Gross per 24 hour  Intake 1658.8 ml  Output --  Net 1658.8 ml   Filed Weights   01/11/20 1107  01/11/20 2200 01/14/20 0400  Weight: 79.4 kg 75.1 kg 74.9 kg    Examination:  General exam: Alert, awake, oriented x 3 Respiratory system: Clear to auscultation. Respiratory effort normal. Cardiovascular system:RRR. No murmurs, rubs, gallops. Gastrointestinal system: Abdomen is nondistended, soft and nontender. No organomegaly or masses felt. Normal bowel sounds heard. Central nervous system: Alert and oriented. No focal neurological deficits. Extremities: No C/C/E, +pedal pulses Skin: No rashes, lesions or ulcers Psychiatry: Judgement and insight appear normal. Mood & affect appropriate.     Data Reviewed: I have personally reviewed following labs and imaging studies  CBC: Recent Labs  Lab 01/11/20 1211 01/12/20 0609 01/13/20 0601  WBC 13.8* 9.8 8.7  NEUTROABS 11.9*  --   --   HGB 14.2 13.5 13.4  HCT RESULTS UNAVAILABLE DUE TO INTERFERING SUBSTANCE 37.4* 36.6*  MCV RESULTS UNAVAILABLE DUE TO INTERFERING SUBSTANCE 88.8 87.8  PLT 220 195 193   Basic Metabolic Panel: Recent Labs  Lab 01/11/20 1211 01/11/20 1211 01/11/20 1533 01/11/20 1633 01/13/20 0601 01/13/20 0944 01/13/20 2259 01/14/20 0515 01/14/20 1059 01/14/20 1431 01/14/20 1742  NA 106*   < > 109*   < > 116*   < > 119* 120* 123* 122* 121*  K 3.6  --  3.5  --  3.3*  --   --  3.5  --   --   --   CL 75*  --  76*  --  82*  --   --  87*  --   --   --   CO2 20*  --  21*  --  24  --   --  23  --   --   --   GLUCOSE 128*  --  99  --  83  --   --  99  --   --   --   BUN 8  --  7*  --  5*  --   --  <5*  --   --   --   CREATININE 0.58*  --  0.54*  --  0.58*  --   --  0.57*  --   --   --   CALCIUM 8.2*  --  8.1*  --  8.1*  --   --  8.2*  --   --   --   MG  --   --   --   --   --   --   --  1.8  --   --   --    < > = values in this interval not displayed.   GFR: Estimated Creatinine Clearance: 101.4 mL/min (A) (by C-G formula based on SCr of 0.57 mg/dL (L)). Liver Function Tests: Recent Labs  Lab 01/11/20 1211 01/13/20 0601  AST 79* 35  ALT 26 21  ALKPHOS 60 54  BILITOT 1.2 1.3*  PROT 7.0 6.1*  ALBUMIN 3.5 3.0*   No results for input(s): LIPASE, AMYLASE in the last 168 hours. Recent Labs  Lab 01/11/20 1356  AMMONIA 24   Coagulation Profile: No results for input(s): INR, PROTIME in the last 168 hours. Cardiac Enzymes: No results for input(s): CKTOTAL, CKMB, CKMBINDEX, TROPONINI in the last 168 hours. BNP (last 3 results) No results for input(s): PROBNP in the last 8760 hours. HbA1C: No results for input(s): HGBA1C in the last 72 hours. CBG: No results for input(s): GLUCAP in the last 168  hours. Lipid Profile: No results for input(s): CHOL, HDL, LDLCALC, TRIG, CHOLHDL, LDLDIRECT in the  last 72 hours. Thyroid Function Tests: No results for input(s): TSH, T4TOTAL, FREET4, T3FREE, THYROIDAB in the last 72 hours. Anemia Panel: No results for input(s): VITAMINB12, FOLATE, FERRITIN, TIBC, IRON, RETICCTPCT in the last 72 hours. Sepsis Labs: Recent Labs  Lab 01/11/20 1211 01/11/20 1356  LATICACIDVEN 2.8* 1.0    Recent Results (from the past 240 hour(s))  Blood culture (routine x 2)     Status: None (Preliminary result)   Collection Time: 01/11/20 12:10 PM   Specimen: BLOOD LEFT ARM  Result Value Ref Range Status   Specimen Description BLOOD LEFT ARM  Final   Special Requests   Final    BOTTLES DRAWN AEROBIC AND ANAEROBIC Blood Culture adequate volume   Culture   Final    NO GROWTH 3 DAYS Performed at [facility], [address] [place], [place] [address]    Report Status PENDING  Incomplete  Blood culture (routine x 2)     Status: None (Preliminary result)   Collection Time: 01/11/20 12:11 PM   Specimen: BLOOD RIGHT FOREARM  Result Value Ref Range Status   Specimen Description BLOOD RIGHT FOREARM  Final   Special Requests Blood Culture adequate volume  Final   Culture   Final    NO GROWTH 3 DAYS Performed at [facility], [address] [place], [place] [address]    Report Status PENDING  Incomplete  Respiratory Panel by RT PCR (Flu A&B, Covid) - Nasopharyngeal Swab     Status: None   Collection Time: 01/11/20 12:25 PM   Specimen: Nasopharyngeal Swab  Result Value Ref Range Status   SARS Coronavirus 2 by RT PCR NEGATIVE NEGATIVE Final    Comment: (NOTE) SARS-CoV-2 target nucleic acids are NOT DETECTED.  The SARS-CoV-2 RNA is generally detectable in upper respiratoy specimens during the acute phase of infection. The lowest concentration of SARS-CoV-2 viral copies this assay can detect is 131 copies/mL. A negative result does not preclude SARS-Cov-2 infection  and should not be used as the sole basis for treatment or other patient management decisions. A negative result may occur with  improper specimen collection/handling, submission of specimen other than nasopharyngeal swab, presence of viral mutation(s) within the areas targeted by this assay, and inadequate number of viral copies (<131 copies/mL). A negative result must be combined with clinical observations, patient history, and epidemiological information. The expected result is Negative.  Fact Sheet for Patients:  https://www.moore.com/  Fact Sheet for Healthcare Providers:  https://www.young.biz/  This test is no t yet approved or cleared by the [place] FDA and  has been authorized for detection and/or diagnosis of SARS-CoV-2 by FDA under an Emergency Use Authorization (EUA). This EUA will remain  in effect (meaning this test can be used) for the duration of the COVID-19 declaration under Section 564(b)(1) of the Act, 21 U.S.C. section 360bbb-3(b)(1), unless the authorization is terminated or revoked sooner.     Influenza A by PCR NEGATIVE NEGATIVE Final   Influenza B by PCR NEGATIVE NEGATIVE Final    Comment: (NOTE) The Xpert Xpress SARS-CoV-2/FLU/RSV assay is intended as an aid in  the diagnosis of influenza from Nasopharyngeal swab specimens and  should not be used as a sole basis for treatment. Nasal washings and  aspirates are unacceptable for Xpert Xpress SARS-CoV-2/FLU/RSV  testing.  Fact Sheet for Patients: https://www.moore.com/  Fact Sheet for Healthcare Providers: https://www.young.biz/  This test is not yet approved or cleared by the [place] FDA and  has been authorized for detection and/or  diagnosis of SARS-CoV-2 by  FDA under an Emergency Use Authorization (EUA). This EUA will remain  in effect (meaning this test can be used) for the duration of the  Covid-19 declaration  under Section 564(b)(1) of the Act, 21  U.S.C. section 360bbb-3(b)(1), unless the authorization is  terminated or revoked. Performed at [facility], [address], [place], [place] [address]   MRSA PCR Screening     Status: None   Collection Time: 01/11/20  8:34 PM   Specimen: Nasal Mucosa; Nasopharyngeal  Result Value Ref Range Status   MRSA by PCR NEGATIVE NEGATIVE Final    Comment:        The GeneXpert MRSA Assay (FDA approved for NASAL specimens only), is one component of a comprehensive MRSA colonization surveillance program. It is not intended to diagnose MRSA infection nor to guide or monitor treatment for MRSA infections. Performed at [facility], [address] [place], [place] [address]          Radiology Studies: US Abdomen Complete  Result Date: 01/13/2020 CLINICAL DATA:  Abnormal liver function tests. EXAM: ABDOMEN ULTRASOUND COMPLETE COMPARISON:  None. FINDINGS: Gallbladder: No gallstones or wall thickening visualized. No sonographic Murphy sign noted by sonographer. Common bile duct: Diameter: 3 mm Liver: Moderately increased parenchymal echogenicity diffusely without a focal lesion identified. Portal vein is patent on color Doppler imaging with normal direction of blood flow towards the liver. IVC: No abnormality visualized. Pancreas: Visualized portion unremarkable. Spleen: Size and appearance within normal limits. Right Kidney: Length: 12.6 cm. Echogenicity within normal limits. No mass or hydronephrosis visualized. Left Kidney: Length: 11.5 cm. Echogenicity within normal limits. No mass or hydronephrosis visualized. Abdominal aorta: No aneurysm visualized with the aortic bifurcation being obscured. Other findings: None. IMPRESSION: 1. Echogenic liver, nonspecific though often seen with steatosis. 2. Otherwise unremarkable abdominal ultrasound. Electronically Signed   By: Sebastian Ache M.D.   On: 01/13/2020 14:20        Scheduled Meds: . Chlorhexidine  Gluconate Cloth  6 each Topical Daily  . folic acid  1 mg Oral Daily  . heparin  5,000 Units Subcutaneous Q8H  . multivitamin with minerals  1 tablet Oral Daily  . nicotine  21 mg Transdermal Daily  . pantoprazole  40 mg Intravenous Q12H  . thiamine  100 mg Oral Daily   Or  . thiamine  100 mg Intravenous Daily   Continuous Infusions: . sodium chloride 75 mL/hr at 01/14/20 1705     LOS: 3 days    Time spent:    Erick Blinks, MD [facility]   If 7PM-7AM, please contact night-coverage [email or web address]  01/14/2020, 6:53 PM

## 2020-01-14 NOTE — Plan of Care (Signed)
  Problem: Acute Rehab OT Goals (only OT should resolve) Goal: Pt. Will Perform Grooming Flowsheets (Taken 01/14/2020 0959) Pt Will Perform Grooming:  with modified independence  sitting Goal: Pt. Will Perform Upper Body Bathing Flowsheets (Taken 01/14/2020 0959) Pt Will Perform Upper Body Bathing:  with modified independence  sitting Goal: Pt. Will Perform Lower Body Bathing Flowsheets (Taken 01/14/2020 0959) Pt Will Perform Lower Body Bathing:  with supervision  sit to/from stand  sitting/lateral leans Goal: Pt. Will Perform Upper Body Dressing Flowsheets (Taken 01/14/2020 0959) Pt Will Perform Upper Body Dressing:  with modified independence  sitting Goal: Pt. Will Perform Lower Body Dressing Flowsheets (Taken 01/14/2020 0959) Pt Will Perform Lower Body Dressing:  with supervision  sit to/from stand  sitting/lateral leans Goal: Pt. Will Transfer To Toilet Flowsheets (Taken 01/14/2020 0959) Pt Will Transfer to Toilet:  with supervision  ambulating  bedside commode Goal: Pt. Will Perform Toileting-Clothing Manipulation Flowsheets (Taken 01/14/2020 0959) Pt Will Perform Toileting - Clothing Manipulation and hygiene:  with supervision  sit to/from stand  sitting/lateral leans Goal: Pt/Caregiver Will Perform Home Exercise Program Flowsheets (Taken 01/14/2020 [phone]) Pt/caregiver will Perform Home Exercise Program:  Increased strength  Both right and left upper extremity  With Supervision  With written HEP provided

## 2020-01-15 DIAGNOSIS — K921 Melena: Secondary | ICD-10-CM | POA: Diagnosis not present

## 2020-01-15 DIAGNOSIS — R945 Abnormal results of liver function studies: Secondary | ICD-10-CM

## 2020-01-15 DIAGNOSIS — F1010 Alcohol abuse, uncomplicated: Secondary | ICD-10-CM | POA: Diagnosis not present

## 2020-01-15 DIAGNOSIS — E871 Hypo-osmolality and hyponatremia: Secondary | ICD-10-CM | POA: Diagnosis not present

## 2020-01-15 DIAGNOSIS — K922 Gastrointestinal hemorrhage, unspecified: Secondary | ICD-10-CM | POA: Diagnosis not present

## 2020-01-15 LAB — CBC
HCT: 36.3 % — ABNORMAL LOW (ref 39.0–52.0)
Hemoglobin: 12.7 g/dL — ABNORMAL LOW (ref 13.0–17.0)
MCH: 31.8 pg (ref 26.0–34.0)
MCHC: 35 g/dL (ref 30.0–36.0)
MCV: 90.8 fL (ref 80.0–100.0)
Platelets: 219 10*3/uL (ref 150–400)
RBC: 4 MIL/uL — ABNORMAL LOW (ref 4.22–5.81)
RDW: 12 % (ref 11.5–15.5)
WBC: 7.8 10*3/uL (ref 4.0–10.5)
nRBC: 0 % (ref 0.0–0.2)

## 2020-01-15 LAB — COMPREHENSIVE METABOLIC PANEL
ALT: 17 U/L (ref 0–44)
AST: 20 U/L (ref 15–41)
Albumin: 2.8 g/dL — ABNORMAL LOW (ref 3.5–5.0)
Alkaline Phosphatase: 51 U/L (ref 38–126)
Anion gap: 7 (ref 5–15)
BUN: <5 mg/dL — ABNORMAL LOW (ref 8–23)
CO2: 25 mmol/L (ref 22–32)
Calcium: 7.8 mg/dL — ABNORMAL LOW (ref 8.9–10.3)
Chloride: 90 mmol/L — ABNORMAL LOW (ref 98–111)
Creatinine, Ser: 0.54 mg/dL — ABNORMAL LOW (ref 0.61–1.24)
GFR, Estimated: >60 mL/min (ref >60)
Glucose, Bld: 111 mg/dL — ABNORMAL HIGH (ref 70–99)
Potassium: 3.2 mmol/L — ABNORMAL LOW (ref 3.5–5.1)
Sodium: 122 mmol/L — ABNORMAL LOW (ref 135–145)
Total Bilirubin: 1 mg/dL (ref 0.3–1.2)
Total Protein: 6 g/dL — ABNORMAL LOW (ref 6.5–8.1)

## 2020-01-15 LAB — PHOSPHORUS: Phosphorus: 3.2 mg/dL (ref 2.5–4.6)

## 2020-01-15 LAB — SODIUM: Sodium: 125 mmol/L — ABNORMAL LOW (ref 135–145)

## 2020-01-15 NOTE — Progress Notes (Signed)
Subjective: Eating breakfast when I entered the room. Tolerating diet well. No nausea, vomiting, abdominal pain, brbpr, or melena. Had 1 BM yesterday.   Objective: Vital signs in last 24 hours: Temp:  [97.8 F (36.6 C)-98.9 F (37.2 C)] 98.4 F (36.9 C) (10/19 0722) Pulse Rate:  [57-96] 73 (10/19 0900) Resp:  [13-24] 18 (10/19 0900) BP: (124-157)/(41-86) 155/81 (10/19 0800) SpO2:  [60 %-98 %] 91 % (10/19 0900) Last BM Date: 01/11/20 General:   Alert and oriented, pleasant Head:  Normocephalic and atraumatic. Abdomen:  Bowel sounds present, soft, non-tender, non-distended. No HSM or hernias noted. No rebound or guarding. No masses appreciated  Msk:  Symmetrical without gross deformities. Normal posture. Pulses:  Normal pulses noted. Extremities:  Without edema. Neurologic:  Grossly normal neurologically. Psych:  Flat affect.  Intake/Output from previous day: 10/18 0701 - 10/19 0700 In: 1658.8 [I.V.:1658.8] Out: 800 [Urine:800] Intake/Output this shift: No intake/output data recorded.  Lab Results: Recent Labs    01/13/20 0601 01/15/20 0212  WBC 8.7 7.8  HGB 13.4 12.7*  HCT 36.6* 36.3*  PLT 193 219   BMET Recent Labs    01/13/20 0601 01/13/20 0944 01/14/20 0515 01/14/20 1059 01/14/20 2203 01/15/20 0212 01/15/20 0708  NA 116*   < > 120*   < > 124* 122* 125*  K 3.3*  --  3.5  --   --  3.2*  --   CL 82*  --  87*  --   --  90*  --   CO2 24  --  23  --   --  25  --   GLUCOSE 83  --  99  --   --  111*  --   BUN 5*  --  <5*  --   --  <5*  --   CREATININE 0.58*  --  0.57*  --   --  0.54*  --   CALCIUM 8.1*  --  8.2*  --   --  7.8*  --    < > = values in this interval not displayed.   LFT Recent Labs    01/13/20 0601 01/15/20 0212  PROT 6.1* 6.0*  ALBUMIN 3.0* 2.8*  AST 35 20  ALT 21 17  ALKPHOS 54 51  BILITOT 1.3* 1.0   PT/INR No results for input(s): LABPROT, INR in the last 72 hours. Hepatitis Panel Recent Labs    01/12/20 1200  HEPBSAG NON  REACTIVE  HCVAB NON REACTIVE  HEPAIGM NON REACTIVE  HEPBIGM NON REACTIVE    Studies/Results: US Abdomen Complete  Result Date: 01/13/2020 CLINICAL DATA:  Abnormal liver function tests. EXAM: ABDOMEN ULTRASOUND COMPLETE COMPARISON:  None. FINDINGS: Gallbladder: No gallstones or wall thickening visualized. No sonographic Murphy sign noted by sonographer. Common bile duct: Diameter: 3 mm Liver: Moderately increased parenchymal echogenicity diffusely without a focal lesion identified. Portal vein is patent on color Doppler imaging with normal direction of blood flow towards the liver. IVC: No abnormality visualized. Pancreas: Visualized portion unremarkable. Spleen: Size and appearance within normal limits. Right Kidney: Length: 12.6 cm. Echogenicity within normal limits. No mass or hydronephrosis visualized. Left Kidney: Length: 11.5 cm. Echogenicity within normal limits. No mass or hydronephrosis visualized. Abdominal aorta: No aneurysm visualized with the aortic bifurcation being obscured. Other findings: None. IMPRESSION: 1. Echogenic liver, nonspecific though often seen with steatosis. 2. Otherwise unremarkable abdominal ultrasound. Electronically Signed   By: Sebastian Ache M.D.   On: 01/13/2020 14:20    Assessment: 64 year old male with  history of alcohol abuse, presenting with generalized weakness to ED, dark stool x1, and found to be heme positive. Mobic prior to admission. Hemoglobin has trended down from 14.2 on admission to 12.7 today. No further overt GI bleeding. Suspect decline in hemoglobin is likely dilutional as he is receiving IV fluids for Na correction. BUN is low.   Not sure patient ever had true melena but he did have heme positive stools. He could certainly have alcohol induced gastritis, duodenitis, or PUD. Plans for EGD have been on hold until hyponatremia improved. Per notes, anesthesia recommending sodium of 130 prior to EGD.  Na is up to 125 today.   Hyponatremia: Profound on  admission with Na 106 due to poor solute intake and beer potomania. Sodium 125 today. Nephrology following.   Elevated AST: Isolated elevation of AST at 79 on admission  Resolved. Likely secondary to alcohol. [place] with fatty liver, normal spleen.  Platelets remain within normal limits.  Acute hepatitis panel negative.    Plan: May advance diet as appropriate. No GI restrictions at this point as he has no ongoing GI bleeding.  Continue PPI BID Monitor for overt GI bleeding.  Monitor hemoglobin.  Reassess in the morning. EGD with hyponatremia improved.  Needs first ever colonoscopy in the outpatient setting.    LOS: 4 days    01/15/2020, 9:33 AM   Ermalinda Memos, PA-C [facility]

## 2020-01-15 NOTE — Progress Notes (Signed)
Patient ID: Byford Schools, male   DOB: 1955-04-25, 64 y.o.   MRN: [id number] S: no events overnight.  More awake and alert this morning. O:BP (!) 155/81   Pulse 73   Temp 98.4 F (36.9 C) (Oral)   Resp 18   Ht 6' (1.829 m)   Wt 74.9 kg   SpO2 91%   BMI 22.39 kg/m   Intake/Output Summary (Last 24 hours) at 01/15/2020 0909 Last data filed at 01/15/2020 0100 Gross per 24 hour  Intake 1658.8 ml  Output 800 ml  Net 858.8 ml   Intake/Output: I/O last 3 completed shifts: In: 1658.8 [I.V.:1658.8] Out: 800 [Urine:800]  Intake/Output this shift:  No intake/output data recorded. Weight change:  Gen: NAD CVS: RRR Resp: cta Abd: +BS, soft, Nt/nD Ext: no edema  Recent Labs  Lab 01/11/20 1211 01/11/20 1211 01/11/20 1533 01/11/20 1633 01/13/20 0601 01/13/20 0944 01/14/20 0515 01/14/20 1059 01/14/20 1431 01/14/20 1742 01/14/20 2203 01/15/20 0212 01/15/20 0708  NA 106*   < > 109*   < > 116*   < > 120* 123* 122* 121* 124* 122* 125*  K 3.6  --  3.5  --  3.3*  --  3.5  --   --   --   --  3.2*  --   CL 75*  --  76*  --  82*  --  87*  --   --   --   --  90*  --   CO2 20*  --  21*  --  24  --  23  --   --   --   --  25  --   GLUCOSE 128*  --  99  --  83  --  99  --   --   --   --  111*  --   BUN 8  --  7*  --  5*  --  <5*  --   --   --   --  <5*  --   CREATININE 0.58*  --  0.54*  --  0.58*  --  0.57*  --   --   --   --  0.54*  --   ALBUMIN 3.5  --   --   --  3.0*  --   --   --   --   --   --  2.8*  --   CALCIUM 8.2*  --  8.1*  --  8.1*  --  8.2*  --   --   --   --  7.8*  --   PHOS  --   --   --   --   --   --   --   --   --   --   --  3.2  --   AST 79*  --   --   --  35  --   --   --   --   --   --  20  --   ALT 26  --   --   --  21  --   --   --   --   --   --  17  --    < > = values in this interval not displayed.   Liver Function Tests: Recent Labs  Lab 01/11/20 1211 01/13/20 0601 01/15/20 0212  AST 79* 35 20  ALT 26 21 17   ALKPHOS 60 54 51  BILITOT 1.2 1.3* 1.0  PROT  7.0 6.1* 6.0*  ALBUMIN 3.5 3.0* 2.8*   No results for input(s): LIPASE, AMYLASE in the last 168 hours. Recent Labs  Lab 01/11/20 1356  AMMONIA 24   CBC: Recent Labs  Lab 01/11/20 1211 01/11/20 1211 01/12/20 0609 01/13/20 0601 01/15/20 0212  WBC 13.8*   < > 9.8 8.7 7.8  NEUTROABS 11.9*  --   --   --   --   HGB 14.2   < > 13.5 13.4 12.7*  HCT RESULTS UNAVAILABLE DUE TO INTERFERING SUBSTANCE   < > 37.4* 36.6* 36.3*  MCV RESULTS UNAVAILABLE DUE TO INTERFERING SUBSTANCE  --  88.8 87.8 90.8  PLT 220   < > 195 193 219   < > = values in this interval not displayed.   Cardiac Enzymes: No results for input(s): CKTOTAL, CKMB, CKMBINDEX, TROPONINI in the last 168 hours. CBG: No results for input(s): GLUCAP in the last 168 hours.  Iron Studies: No results for input(s): IRON, TIBC, TRANSFERRIN, FERRITIN in the last 72 hours. Studies/Results: US Abdomen Complete  Result Date: 01/13/2020 CLINICAL DATA:  Abnormal liver function tests. EXAM: ABDOMEN ULTRASOUND COMPLETE COMPARISON:  None. FINDINGS: Gallbladder: No gallstones or wall thickening visualized. No sonographic Murphy sign noted by sonographer. Common bile duct: Diameter: 3 mm Liver: Moderately increased parenchymal echogenicity diffusely without a focal lesion identified. Portal vein is patent on color Doppler imaging with normal direction of blood flow towards the liver. IVC: No abnormality visualized. Pancreas: Visualized portion unremarkable. Spleen: Size and appearance within normal limits. Right Kidney: Length: 12.6 cm. Echogenicity within normal limits. No mass or hydronephrosis visualized. Left Kidney: Length: 11.5 cm. Echogenicity within normal limits. No mass or hydronephrosis visualized. Abdominal aorta: No aneurysm visualized with the aortic bifurcation being obscured. Other findings: None. IMPRESSION: 1. Echogenic liver, nonspecific though often seen with steatosis. 2. Otherwise unremarkable abdominal ultrasound. Electronically  Signed   By: Sebastian Ache M.D.   On: 01/13/2020 14:20   . Chlorhexidine Gluconate Cloth  6 each Topical Daily  . folic acid  1 mg Oral Daily  . heparin  5,000 Units Subcutaneous Q8H  . multivitamin with minerals  1 tablet Oral Daily  . nicotine  21 mg Transdermal Daily  . pantoprazole  40 mg Intravenous Q12H  . thiamine  100 mg Oral Daily   Or  . thiamine  100 mg Intravenous Daily    BMET    Component Value Date/Time   NA 125 (L) 01/15/2020 0708   NA 136 03/24/2013 2105   K 3.2 (L) 01/15/2020 0212   K 4.1 03/24/2013 2105   CL 90 (L) 01/15/2020 0212   CL 105 03/24/2013 2105   CO2 25 01/15/2020 0212   CO2 23 03/24/2013 2105   GLUCOSE 111 (H) 01/15/2020 0212   GLUCOSE 109 (H) 03/24/2013 2105   BUN <5 (L) 01/15/2020 0212   BUN 13 03/24/2013 2105   CREATININE 0.54 (L) 01/15/2020 0212   CREATININE 0.75 03/24/2013 2105   CALCIUM 7.8 (L) 01/15/2020 0212   CALCIUM 8.8 03/24/2013 2105   GFRNONAA >60 01/15/2020 0212   GFRNONAA >60 03/24/2013 2105   GFRAA >60 03/24/2013 2105   CBC    Component Value Date/Time   WBC 7.8 01/15/2020 0212   RBC 4.00 (L) 01/15/2020 0212   HGB 12.7 (L) 01/15/2020 0212   HGB 14.9 03/24/2013 2105   HCT 36.3 (L) 01/15/2020 0212   HCT 45.9 03/24/2013 2105   PLT 219 01/15/2020 0212   PLT 243 03/24/2013 2105  MCV 90.8 01/15/2020 0212   MCV 91 03/24/2013 2105   MCH 31.8 01/15/2020 0212   MCHC 35.0 01/15/2020 0212   RDW 12.0 01/15/2020 0212   RDW 13.8 03/24/2013 2105   LYMPHSABS 0.6 (L) 01/11/2020 1211   MONOABS 1.4 (H) 01/11/2020 1211   EOSABS 0.0 01/11/2020 1211   BASOSABS 0.0 01/11/2020 1211    Assessment/Plan:  1. Hyponatremia, severe- Uosm 211, Sosm 225, Urine Na 15 consistent with poor solute intake and beer potomania.  Improving with IV normal saline and is now up to 120 (from 106 on 01/11/20).  ARB held.   1. Sodium up to 125 this morning. 2. Continue with IVf's and follow sodium levels.   3. Low BUN due to poor protein intake, add  supplements. 2. Acute metabolic encephalopathy- CT scan of head negative.  Slowly improving.   3. Alcohol abuse- continue with CIWA protocol per primary 4. HTN- actually had hypotension and meds on hold.  BP stable for now. 5. Heme + stools per primary.  Hgb stable.   Irena Cords, MD [employer] [phone]

## 2020-01-15 NOTE — Progress Notes (Signed)
PROGRESS NOTE    Barry Fowler  WJX:[id number] DOB: 12-30-1955 DOA: 01/11/2020 PCP: Patient, No Pcp Per    Brief Narrative:   Barry Fowler  is a 64 y.o. male, with past medical history of hypertension, hyperlipidemia, tobacco abuse, 1 pack/day for last 30 years, sister at bedside assist with the history, patient was started last month on losartan for high blood pressure, patient with heavy alcohol abuse, up to 12 beers per day, patient presents to ED secondary couple weeks of generalized weakness, as well for last few days he had diminished appetite, and there was some questionable left-sided facial droop, as well as some nausea, and one episode of dark bowel movement, which prompted family to call EMS, patient lives with his nephew, patient denies any chest pain, denies any shortness of breath, headache, abdominal pain, vomiting, coffee-ground emesis. - in ED patient was noted to have sodium of 106, cultures pending, but hemoglobin is stable at 13.2, his potassium is 3.6, CT head with no acute findings, COVID-19 test has been negative, UA with no acute finding, negative urine drug screen, UA and chest x-ray is pending.   Assessment & Plan:   Active Problems:   Hyponatremia   Alcohol abuse   Abnormal LFTs (liver function tests)   Melena   Hyponatremia -Likely related to beer drinkers potomania -Sodium levels are responding to saline and are slowly trending up. -Appreciate nephrology assistance -continue current treatments  Acute metabolic encephalopathy -CT head negative -Likely secondary to above -Appears to be improving  Alcohol abuse -No signs of withdrawal at this time -Continue on CIWA protocol  Hypertension -Losartan currently on hold -Blood pressures are stable -Continue to monitor  Hyperlipidemia -Resume statin when stable  GI bleeding -Patient noted to have dark-colored stools that were heme positive -He was taking Mobic prior to admission -Seen by GI with plans  for endoscopy once serum sodium greater than 130 -Continue on Protonix -Per GI, started on soft diet -will keep npo after midnight for possible EGD in AM  Elevated LFTs -Abdominal ultrasound negative -Although suspect this is related to alcohol -Viral hepatitis panel negative  DVT prophylaxis: heparin injection 5,000 Units Start: 01/11/20 1645 SCDs Start: 01/11/20 1637  Code Status: Full code Family Communication: Discussed with patient Disposition Plan: Status is: Inpatient  Remains inpatient appropriate because:IV treatments appropriate due to intensity of illness or inability to take PO   Dispo: The patient is from: Home              Anticipated d/c is to: SNF              Anticipated d/c date is: 1 day              Patient currently is not medically stable to d/c.   Consultants:   Nephrology  Gastroenterology  Procedures:     Antimicrobials:       Subjective: No shortness of breath. He does not have any tremors. No vomiting.   Objective: Vitals:   01/15/20 0900 01/15/20 0945 01/15/20 1000 01/15/20 1111  BP:   128/73   Pulse: 73 (!) 105 (!) 104 91  Resp: 18 13 19 11   Temp:    98.7 F (37.1 C)  TempSrc:    Oral  SpO2: 91%  95% 94%  Weight:      Height:        Intake/Output Summary (Last 24 hours) at 01/15/2020 1132 Last data filed at 01/15/2020 0100 Gross per 24 hour  Intake 1658.8  ml  Output 800 ml  Net 858.8 ml   Filed Weights   01/11/20 1107 01/11/20 2200 01/14/20 0400  Weight: 79.4 kg 75.1 kg 74.9 kg    Examination:  General exam: Alert, awake, oriented x 3 Respiratory system: Clear to auscultation. Respiratory effort normal. Cardiovascular system:RRR. No murmurs, rubs, gallops. Gastrointestinal system: Abdomen is nondistended, soft and nontender. No organomegaly or masses felt. Normal bowel sounds heard. Central nervous system: Alert and oriented. No focal neurological deficits. Extremities: No C/C/E, +pedal pulses Skin: No rashes,  lesions or ulcers Psychiatry: Judgement and insight appear normal. Mood & affect appropriate.    Data Reviewed: I have personally reviewed following labs and imaging studies  CBC: Recent Labs  Lab 01/11/20 1211 01/12/20 0609 01/13/20 0601 01/15/20 0212  WBC 13.8* 9.8 8.7 7.8  NEUTROABS 11.9*  --   --   --   HGB 14.2 13.5 13.4 12.7*  HCT RESULTS UNAVAILABLE DUE TO INTERFERING SUBSTANCE 37.4* 36.6* 36.3*  MCV RESULTS UNAVAILABLE DUE TO INTERFERING SUBSTANCE 88.8 87.8 90.8  PLT 220 195 193 219   Basic Metabolic Panel: Recent Labs  Lab 01/11/20 1211 01/11/20 1211 01/11/20 1533 01/11/20 1633 01/13/20 0601 01/13/20 0944 01/14/20 0515 01/14/20 1059 01/14/20 1431 01/14/20 1742 01/14/20 2203 01/15/20 0212 01/15/20 0708  NA 106*   < > 109*   < > 116*   < > 120*   < > 122* 121* 124* 122* 125*  K 3.6  --  3.5  --  3.3*  --  3.5  --   --   --   --  3.2*  --   CL 75*  --  76*  --  82*  --  87*  --   --   --   --  90*  --   CO2 20*  --  21*  --  24  --  23  --   --   --   --  25  --   GLUCOSE 128*  --  99  --  83  --  99  --   --   --   --  111*  --   BUN 8  --  7*  --  5*  --  <5*  --   --   --   --  <5*  --   CREATININE 0.58*  --  0.54*  --  0.58*  --  0.57*  --   --   --   --  0.54*  --   CALCIUM 8.2*  --  8.1*  --  8.1*  --  8.2*  --   --   --   --  7.8*  --   MG  --   --   --   --   --   --  1.8  --   --   --   --   --   --   PHOS  --   --   --   --   --   --   --   --   --   --   --  3.2  --    < > = values in this interval not displayed.   GFR: Estimated Creatinine Clearance: 101.4 mL/min (A) (by C-G formula based on SCr of 0.54 mg/dL (L)). Liver Function Tests: Recent Labs  Lab 01/11/20 1211 01/13/20 0601 01/15/20 0212  AST 79* 35 20  ALT 26 21 17   ALKPHOS 60 54 51  BILITOT 1.2 1.3* 1.0  PROT 7.0 6.1* 6.0*  ALBUMIN 3.5 3.0* 2.8*   No results for input(s): LIPASE, AMYLASE in the last 168 hours. Recent Labs  Lab 01/11/20 1356  AMMONIA 24   Coagulation  Profile: No results for input(s): INR, PROTIME in the last 168 hours. Cardiac Enzymes: No results for input(s): CKTOTAL, CKMB, CKMBINDEX, TROPONINI in the last 168 hours. BNP (last 3 results) No results for input(s): PROBNP in the last 8760 hours. HbA1C: No results for input(s): HGBA1C in the last 72 hours. CBG: No results for input(s): GLUCAP in the last 168 hours. Lipid Profile: No results for input(s): CHOL, HDL, LDLCALC, TRIG, CHOLHDL, LDLDIRECT in the last 72 hours. Thyroid Function Tests: No results for input(s): TSH, T4TOTAL, FREET4, T3FREE, THYROIDAB in the last 72 hours. Anemia Panel: No results for input(s): VITAMINB12, FOLATE, FERRITIN, TIBC, IRON, RETICCTPCT in the last 72 hours. Sepsis Labs: Recent Labs  Lab 01/11/20 1211 01/11/20 1356  LATICACIDVEN 2.8* 1.0    Recent Results (from the past 240 hour(s))  Blood culture (routine x 2)     Status: None (Preliminary result)   Collection Time: 01/11/20 12:10 PM   Specimen: BLOOD LEFT ARM  Result Value Ref Range Status   Specimen Description BLOOD LEFT ARM  Final   Special Requests   Final    BOTTLES DRAWN AEROBIC AND ANAEROBIC Blood Culture adequate volume   Culture   Final    NO GROWTH 4 DAYS Performed at [facility], [address] [place], [place] [address]    Report Status PENDING  Incomplete  Blood culture (routine x 2)     Status: None (Preliminary result)   Collection Time: 01/11/20 12:11 PM   Specimen: BLOOD RIGHT FOREARM  Result Value Ref Range Status   Specimen Description BLOOD RIGHT FOREARM  Final   Special Requests Blood Culture adequate volume  Final   Culture   Final    NO GROWTH 4 DAYS Performed at [facility], [address] [place], [place] [address]    Report Status PENDING  Incomplete  Respiratory Panel by RT PCR (Flu A&B, Covid) - Nasopharyngeal Swab     Status: None   Collection Time: 01/11/20 12:25 PM   Specimen: Nasopharyngeal Swab  Result Value Ref Range Status   SARS Coronavirus 2  by RT PCR NEGATIVE NEGATIVE Final    Comment: (NOTE) SARS-CoV-2 target nucleic acids are NOT DETECTED.  The SARS-CoV-2 RNA is generally detectable in upper respiratoy specimens during the acute phase of infection. The lowest concentration of SARS-CoV-2 viral copies this assay can detect is 131 copies/mL. A negative result does not preclude SARS-Cov-2 infection and should not be used as the sole basis for treatment or other patient management decisions. A negative result may occur with  improper specimen collection/handling, submission of specimen other than nasopharyngeal swab, presence of viral mutation(s) within the areas targeted by this assay, and inadequate number of viral copies (<131 copies/mL). A negative result must be combined with clinical observations, patient history, and epidemiological information. The expected result is Negative.  Fact Sheet for Patients:  https://www.moore.com/https://www.fda.gov/media/142436/download  Fact Sheet for Healthcare Providers:  https://www.young.biz/https://www.fda.gov/media/142435/download  This test is no t yet approved or cleared by the [place] FDA and  has been authorized for detection and/or diagnosis of SARS-CoV-2 by FDA under an Emergency Use Authorization (EUA). This EUA will remain  in effect (meaning this test can be used) for the duration of the COVID-19 declaration under Section 564(b)(1) of the Act,  21 U.S.C. section 360bbb-3(b)(1), unless the authorization is terminated or revoked sooner.     Influenza A by PCR NEGATIVE NEGATIVE Final   Influenza B by PCR NEGATIVE NEGATIVE Final    Comment: (NOTE) The Xpert Xpress SARS-CoV-2/FLU/RSV assay is intended as an aid in  the diagnosis of influenza from Nasopharyngeal swab specimens and  should not be used as a sole basis for treatment. Nasal washings and  aspirates are unacceptable for Xpert Xpress SARS-CoV-2/FLU/RSV  testing.  Fact Sheet for Patients: https://www.moore.com/  Fact Sheet  for Healthcare Providers: https://www.young.biz/  This test is not yet approved or cleared by the [place] FDA and  has been authorized for detection and/or diagnosis of SARS-CoV-2 by  FDA under an Emergency Use Authorization (EUA). This EUA will remain  in effect (meaning this test can be used) for the duration of the  Covid-19 declaration under Section 564(b)(1) of the Act, 21  U.S.C. section 360bbb-3(b)(1), unless the authorization is  terminated or revoked. Performed at [facility], [address] [place], [place] [address]   MRSA PCR Screening     Status: None   Collection Time: 01/11/20  8:34 PM   Specimen: Nasal Mucosa; Nasopharyngeal  Result Value Ref Range Status   MRSA by PCR NEGATIVE NEGATIVE Final    Comment:        The GeneXpert MRSA Assay (FDA approved for NASAL specimens only), is one component of a comprehensive MRSA colonization surveillance program. It is not intended to diagnose MRSA infection nor to guide or monitor treatment for MRSA infections. Performed at [facility], [address] [place], [place] [address]          Radiology Studies: No results found.      Scheduled Meds: . Chlorhexidine Gluconate Cloth  6 each Topical Daily  . folic acid  1 mg Oral Daily  . heparin  5,000 Units Subcutaneous Q8H  . multivitamin with minerals  1 tablet Oral Daily  . nicotine  21 mg Transdermal Daily  . pantoprazole  40 mg Intravenous Q12H  . thiamine  100 mg Oral Daily   Or  . thiamine  100 mg Intravenous Daily   Continuous Infusions: . sodium chloride 75 mL/hr at 01/15/20 0844     LOS: 4 days    Time spent:    Erick Blinks, MD [facility]   If 7PM-7AM, please contact night-coverage [email or web address]  01/15/2020, 11:32 AM

## 2020-01-16 DIAGNOSIS — F1010 Alcohol abuse, uncomplicated: Secondary | ICD-10-CM

## 2020-01-16 DIAGNOSIS — R945 Abnormal results of liver function studies: Secondary | ICD-10-CM

## 2020-01-16 DIAGNOSIS — E871 Hypo-osmolality and hyponatremia: Secondary | ICD-10-CM

## 2020-01-16 DIAGNOSIS — K921 Melena: Secondary | ICD-10-CM

## 2020-01-16 LAB — CBC
HCT: 36.7 % — ABNORMAL LOW (ref 39.0–52.0)
Hemoglobin: 12.7 g/dL — ABNORMAL LOW (ref 13.0–17.0)
MCH: 31.9 pg (ref 26.0–34.0)
MCHC: 34.6 g/dL (ref 30.0–36.0)
MCV: 92.2 fL (ref 80.0–100.0)
Platelets: 251 10*3/uL (ref 150–400)
RBC: 3.98 MIL/uL — ABNORMAL LOW (ref 4.22–5.81)
RDW: 12.2 % (ref 11.5–15.5)
WBC: 8.6 10*3/uL (ref 4.0–10.5)
nRBC: 0 % (ref 0.0–0.2)

## 2020-01-16 LAB — CULTURE, BLOOD (ROUTINE X 2)
Culture: NO GROWTH
Culture: NO GROWTH
Special Requests: ADEQUATE
Special Requests: ADEQUATE

## 2020-01-16 LAB — RENAL FUNCTION PANEL
Albumin: 2.7 g/dL — ABNORMAL LOW (ref 3.5–5.0)
Anion gap: 10 (ref 5–15)
BUN: 7 mg/dL — ABNORMAL LOW (ref 8–23)
CO2: 26 mmol/L (ref 22–32)
Calcium: 8.3 mg/dL — ABNORMAL LOW (ref 8.9–10.3)
Chloride: 89 mmol/L — ABNORMAL LOW (ref 98–111)
Creatinine, Ser: 0.59 mg/dL — ABNORMAL LOW (ref 0.61–1.24)
GFR, Estimated: >60 mL/min (ref >60)
Glucose, Bld: 119 mg/dL — ABNORMAL HIGH (ref 70–99)
Phosphorus: 3.3 mg/dL (ref 2.5–4.6)
Potassium: 3 mmol/L — ABNORMAL LOW (ref 3.5–5.1)
Sodium: 125 mmol/L — ABNORMAL LOW (ref 135–145)

## 2020-01-16 MED ORDER — POTASSIUM CHLORIDE CRYS ER 20 MEQ PO TBCR
40.0000 meq | EXTENDED_RELEASE_TABLET | Freq: Once | ORAL | Status: AC
  Administered 2020-01-16: 40 meq via ORAL
  Filled 2020-01-16: qty 2

## 2020-01-16 NOTE — Progress Notes (Signed)
[facility] Progress Note    Assessment/ Plan:   1. Hyponatremia, severe- Uosm 211, Sosm 225, Urine Na 15 consistent with poor solute intake and beer potomania. Improving with IV normal saline and is now up to 125 (from 106 on 01/11/20). ARB held.  1. Sodium up to 125 this morning. 2. Continue with IVf's and follow sodium levels, anticipate will need at least 24 more hrs 3. Low BUN due to poor protein intake, add supplements. 4. No fluid or salt restriction 2. Acute metabolic encephalopathy- CT scan of head negative. Slowly improving.  3. Alcohol abuse- continue with CIWA protocol per primary 4. HTN- actually had hypotension and meds on hold. BP stable for now. 5. Heme + stools per primary. Hgb stable.  6. Hypokalemia: replete prn  Subjective:    No acute events overnight.  Na 125 this AM, overall improving.     Objective:   BP (!) 155/91   Pulse 70   Temp 97.8 F (36.6 C) (Oral)   Resp 19   Ht 6' (1.829 m)   Wt 75 kg   SpO2 95%   BMI 22.42 kg/m   Intake/Output Summary (Last 24 hours) at 01/16/2020 1019 Last data filed at 01/16/2020 0700 Gross per 24 hour  Intake 2828.42 ml  Output 1700 ml  Net 1128.42 ml   Weight change:   Physical Exam: Gen: NAD CVS: RRR Resp: clear Abd: no tenderness Ext: no LE edema NEURO: AAO x 3   Imaging: No results found.  Labs: BMET Recent Labs  Lab 01/11/20 1211 01/11/20 1211 01/11/20 1533 01/11/20 1633 01/13/20 0601 01/13/20 0944 01/14/20 0515 01/14/20 0515 01/14/20 1059 01/14/20 1431 01/14/20 1742 01/14/20 2203 01/15/20 0212 01/15/20 0708 01/16/20 0330  NA 106*   < > 109*   < > 116*   < > 120*   < > 123* 122* 121* 124* 122* 125* 125*  K 3.6  --  3.5  --  3.3*  --  3.5  --   --   --   --   --  3.2*  --  3.0*  CL 75*  --  76*  --  82*  --  87*  --   --   --   --   --  90*  --  89*  CO2 20*  --  21*  --  24  --  23  --   --   --   --   --  25  --  26  GLUCOSE 128*  --  99  --  83  --  99  --    --   --   --   --  111*  --  119*  BUN 8  --  7*  --  5*  --  <5*  --   --   --   --   --  <5*  --  7*  CREATININE 0.58*  --  0.54*  --  0.58*  --  0.57*  --   --   --   --   --  0.54*  --  0.59*  CALCIUM 8.2*  --  8.1*  --  8.1*  --  8.2*  --   --   --   --   --  7.8*  --  8.3*  PHOS  --   --   --   --   --   --   --   --   --   --   --   --  3.2  --  3.3   < > = values in this interval not displayed.   CBC Recent Labs  Lab 01/11/20 1211 01/12/20 0609 01/13/20 0601 01/15/20 0212  WBC 13.8* 9.8 8.7 7.8  NEUTROABS 11.9*  --   --   --   HGB 14.2 13.5 13.4 12.7*  HCT RESULTS UNAVAILABLE DUE TO INTERFERING SUBSTANCE 37.4* 36.6* 36.3*  MCV RESULTS UNAVAILABLE DUE TO INTERFERING SUBSTANCE 88.8 87.8 90.8  PLT 220 195 193 219    Medications:    . Chlorhexidine Gluconate Cloth  6 each Topical Daily  . folic acid  1 mg Oral Daily  . heparin  5,000 Units Subcutaneous Q8H  . multivitamin with minerals  1 tablet Oral Daily  . nicotine  21 mg Transdermal Daily  . pantoprazole  40 mg Intravenous Q12H  . thiamine  100 mg Oral Daily   Or  . thiamine  100 mg Intravenous Daily      Bufford Buttner, MD 01/16/2020, 10:19 AM

## 2020-01-16 NOTE — Progress Notes (Signed)
GI Inpatient Follow-up Note  Patient Identification: Luisenrique Conran is a 64 y.o. male w/ PMHx of HTN,HLD, tobacco abuse, alcohol abuse (12 beers/day) admitted for weakness, poor appetitie, nausea, dark BM. Admitted for weakness, low appetite, and dark stool   Subjective: Some improvement overnight. Had a brown bowel movement this morning. No nausea vomiting or abdominal pain. States she is feeling "a lot better". Tolerated bolus cereal for breakfast and [place] noodles for lunch.  Denies NSAIDs as an outpatient. States he smokes 1 pack a day. Today reports generally has a 6 pack a day.  Scheduled Inpatient Medications:  . Chlorhexidine Gluconate Cloth  6 each Topical Daily  . folic acid  1 mg Oral Daily  . heparin  5,000 Units Subcutaneous Q8H  . multivitamin with minerals  1 tablet Oral Daily  . nicotine  21 mg Transdermal Daily  . pantoprazole  40 mg Intravenous Q12H  . thiamine  100 mg Oral Daily   Or  . thiamine  100 mg Intravenous Daily    Continuous Inpatient Infusions:   . sodium chloride 75 mL/hr at 01/16/20 0910    PRN Inpatient Medications:  guaiFENesin-dextromethorphan  Review of Systems: Constitutional: Weight is stable.  Eyes: No changes in vision. ENT: No oral lesions, sore throat.  GI: see HPI.  Heme/Lymph: No easy bruising.  CV: No chest pain.  GU: No hematuria.  Integumentary: No rashes.  Neuro: No headaches.  Psych: No depression/anxiety.  Endocrine: No heat/cold intolerance.  Allergic/Immunologic: No urticaria.  Resp: No cough, SOB.  Musculoskeletal: No joint swelling.    Physical Examination: BP (!) 155/91   Pulse 70   Temp 97.8 F (36.6 C) (Oral)   Resp 19   Ht 6' (1.829 m)   Wt 75 kg   SpO2 95%   BMI 22.42 kg/m  Gen: NAD, alert and oriented x 4. Sitting in chair eating lunch. HEENT: PEERLA, EOMI, Neck: supple, no JVD or thyromegaly Chest: CTA bilaterally, no wheezes, crackles, or other adventitious sounds CV: RRR, no m/g/c/r Abd:  soft, NT, ND, +BS in all four quadrants; no HSM, guarding, ridigity, or rebound tenderness Ext: no edema, well perfused with 2+ pulses, Skin: no rash or lesions noted Lymph: no LAD  Data: Lab Results  Component Value Date   WBC 7.8 01/15/2020   HGB 12.7 (L) 01/15/2020   HCT 36.3 (L) 01/15/2020   MCV 90.8 01/15/2020   PLT 219 01/15/2020   Recent Labs  Lab 01/12/20 0609 01/13/20 0601 01/15/20 0212  HGB 13.5 13.4 12.7*   Lab Results  Component Value Date   NA 125 (L) 01/16/2020   K 3.0 (L) 01/16/2020   CL 89 (L) 01/16/2020   CO2 26 01/16/2020   BUN 7 (L) 01/16/2020   CREATININE 0.59 (L) 01/16/2020   Lab Results  Component Value Date   ALT 17 01/15/2020   AST 20 01/15/2020   ALKPHOS 51 01/15/2020   BILITOT 1.0 01/15/2020   No results for input(s): APTT, INR, PTT in the last 168 hours.   Assessment/Plan:  Mr. Snarski is a 64 y.o. male admitted for hyponatremia & generalized weakness w/ one epsidoe of dark stool hemoccult positive. On mobic prior to admission but denies other OTC NSAIDs. Started on protonix IV BID   1. Positive hemoccult - suspect initial drop less than 1 gram was dilutional and yesterday's Hgb stable. No AM CBC. Will check Hgb. Plan for EGD when clinically feasible and sodium improved to 130 (nephro following)-likely tomorrow. Continue bland  diet in interim until switched to NPO prior to procedure. Is on heparin TID   2. Alcohol abuse-on CIWA protocol, seems open to stopping alcohol at d/c. LFT now normal. Low albumin but normal platelets. [place] w/ echogenic liver but no obvious cirrhosis, certainly will be at risk if keeps drinking heavily   Recommendations: 1. Check CBC today 2. Continue soft diet then NPO at MN 3. Plan for EGD tomorrow pending Na>130  Please call with questions or concerns. Case discussed w/ Dr Karilyn Cota.    Bluford Kaufmann, PA-C [facility]

## 2020-01-16 NOTE — Progress Notes (Signed)
Physical Therapy Treatment Patient Details Name: Barry Fowler MRN: [id number] DOB: 20-Jan-1956 Today's Date: 01/16/2020    History of Present Illness Barry Fowler  is a 64 y.o. male, with past medical history of hypertension, hyperlipidemia, tobacco abuse, 1 pack/day for last 30 years, sister at bedside assist with the history, patient was started last month on losartan for high blood pressure, patient with heavy alcohol abuse, up to 12 beers per day, patient presents to ED secondary couple weeks of generalized weakness, as well for last few days he had diminished appetite, and there was some questionable left-sided facial droop, as well as some nausea, and one episode of dark bowel movement, which prompted family to call EMS, patient lives with his nephew, patient denies any chest pain, denies any shortness of breath, headache, abdominal pain, vomiting, coffee-ground emesis.    PT Comments    Patient presents alert, cooperate and demonstrates increased endurance/distance for ambulation and completing functional activity.  Patient unsteady on feet using quad-cane with near loss of balance during initial sit to stand, some improvement noted during gait training, but unsteady and requires increased time for making turns and frequent verbal/tactile cueing when transferring back to chair.  Patient tolerated staying up in chair after therapy - nursing staff aware.  Patient will benefit from continued physical therapy in hospital and recommended venue below to increase strength, balance, endurance for safe ADLs and gait.   Follow Up Recommendations  SNF;Supervision for mobility/OOB;Supervision/Assistance - 24 hour     Equipment Recommendations  Rolling walker with 5" wheels    Recommendations for Other Services       Precautions / Restrictions Precautions Precautions: Fall Restrictions Weight Bearing Restrictions: No    Mobility  Bed Mobility               General bed mobility comments:  Patient presents in chair (assisted by nursing staff)  Transfers Overall transfer level: Needs assistance Equipment used: Quad cane Transfers: Sit to/from [employer] Sit to Stand: Min assist;Mod assist Stand pivot transfers: Min assist       General transfer comment: unsteady labored movement with near loss of balance during intial sit to stand  Ambulation/Gait Ambulation/Gait assistance: Min assist Gait Distance (Feet): 35 Feet Assistive device: Quad cane Gait Pattern/deviations: Decreased step length - right;Decreased step length - left;Decreased stride length;Step-to pattern Gait velocity: dereased   General Gait Details: slow labored unsteady cadence with frequent near loss of balance, increased time to make turns and limited secondary to fatigue   Stairs             Wheelchair Mobility    Modified Rankin (Stroke Patients Only)       Balance Overall balance assessment: Needs assistance Sitting-balance support: Feet supported;No upper extremity supported Sitting balance-Leahy Scale: Good Sitting balance - Comments: seated in chair   Standing balance support: During functional activity;Single extremity supported Standing balance-Leahy Scale: Poor Standing balance comment: fair/poor using quad-cane                            Cognition Arousal/Alertness: Awake/alert Behavior During Therapy: WFL for tasks assessed/performed Overall Cognitive Status: Within Functional Limits for tasks assessed                                        Exercises General Exercises - Lower Extremity Long Arc Quad: Seated;AROM;Strengthening;Both;10  reps Hip Flexion/Marching: Seated;AROM;Strengthening;Both;10 reps Toe Raises: Seated;AROM;Strengthening;Both;15 reps Heel Raises: Seated;AROM;Strengthening;Both;15 reps    General Comments        Pertinent Vitals/Pain Pain Assessment: No/denies pain    Home Living                       Prior Function            PT Goals (current goals can now be found in the care plan section) Acute Rehab PT Goals Patient Stated Goal: return home with family to assist PT Goal Formulation: With patient Time For Goal Achievement: 01/28/20 Potential to Achieve Goals: Good Progress towards PT goals: Progressing toward goals    Frequency    Min 3X/week      PT Plan Current plan remains appropriate    Co-evaluation              AM-PAC PT "6 Clicks" Mobility   Outcome Measure  Help needed turning from your back to your side while in a flat bed without using bedrails?: A Little Help needed moving from lying on your back to sitting on the side of a flat bed without using bedrails?: A Little Help needed moving to and from a bed to a chair (including a wheelchair)?: A Lot Help needed standing up from a chair using your arms (e.g., wheelchair or bedside chair)?: A Little Help needed to walk in hospital room?: A Lot Help needed climbing 3-5 steps with a railing? : A Lot 6 Click Score: 15    End of Session   Activity Tolerance: Patient tolerated treatment well;Patient limited by fatigue Patient left: in chair;with call bell/phone within reach;with chair alarm set Nurse Communication: Mobility status PT Visit Diagnosis: Unsteadiness on feet (R26.81);Other abnormalities of gait and mobility (R26.89);Muscle weakness (generalized) (M62.81)     Time: 4315-4008 PT Time Calculation (min) (ACUTE ONLY): 24 min  Charges:  $Gait Training: 8-22 mins $Therapeutic Exercise: 8-22 mins                     2:44 PM, 01/16/20 Ocie Bob, MPT Physical Therapist with [facility] [phone] office [phone] mobile phone

## 2020-01-16 NOTE — Progress Notes (Addendum)
PROGRESS NOTE    Grayling Schranz  ALP:[id number] DOB: 14-Oct-1955 DOA: 01/11/2020 PCP: Patient, No Pcp Per   Brief Narrative:  [name],with past medical history of hypertension, hyperlipidemia, tobacco abuse, 1 pack/day for last 30 years, sister at bedside assist with the history, patient was started last month on losartan for high blood pressure, patient with heavy alcohol abuse, up to 12 beers per day, patient presents to ED secondary couple weeks of generalized weakness, as well for last few days he had diminished appetite, and there was some questionable left-sided facial droop, as well as some nausea, and one episode of dark bowel movement, which prompted family to call EMS, patient lives with his nephew, patient denies any chest pain, denies any shortness of breath, headache, abdominal pain, vomiting, coffee-ground emesis. - in EDpatient was noted to have sodium of 106, cultures pending, but hemoglobin is stable at 13.2, his potassium is 3.6, CT head with no acute findings, COVID-19 test has been negative, UA with no acute finding, negative urine drug screen, UA and chest x-ray is pending.  10/20: Patient continues to have minimal improvement in sodium levels at 125 this morning.  Per nephrology, plan to continue ongoing IV infusion and close monitoring.  Advance diet as tolerated with no plans for endoscopy while inpatient.  Hemoglobin will be reevaluated in a.m.  No overt bleeding noted.  Assessment & Plan:   Active Problems:   Hyponatremia   Alcohol abuse   Abnormal LFTs (liver function tests)   Melena   Hyponatremia-improving -Likely related to beer drinkers potomania -Sodium levels are responding to saline and are slowly trending up. -Appreciate nephrology assistance -continue current treatments  Hypokalemia -Replete and re-evaluate in am  Acute metabolic encephalopathy-improved -CT head negative -Likely secondary to above -Appears to be  improving  Alcohol abuse -No signs of withdrawal at this time -Continue on CIWA protocol  Hypertension-stable -Losartan currently on hold -Blood pressures are stable -Continue to monitor  Hyperlipidemia -Resume statin when stable and tolerating diet  GI bleeding-appears resolved -Patient noted to have dark-colored stools that were heme positive -He was taking Mobic prior to admission, avoid on dc -Continue on Protonix BID -Per GI, EGD once Na 130 or greater, will keep npo after MN -Started on soft diet for today  Elevated LFTs-resolved -Abdominal ultrasound negative -Although suspect this is related to alcohol -Viral hepatitis panel negative  DVT prophylaxis: heparin injection 5,000 Units Start: 01/11/20 1645 SCDs Start: 01/11/20 1637  Code Status: Full code Family Communication: Discussed with patient Disposition Plan: Status is: Inpatient  Remains inpatient appropriate because:IV treatments appropriate due to intensity of illness or inability to take PO   Dispo: The patient is from: Home  Anticipated d/c is to: SNF  Anticipated d/c date is: 1-2 days  Patient currently is not medically stable to d/c. OK for transfer to med-surg.   Consultants:   Nephrology  Gastroenterology  Procedures:   None  Antimicrobials:  Anti-infectives (From admission, onward)   None       Subjective: Patient seen and evaluated today with no new acute complaints or concerns. No acute concerns or events noted overnight.  Objective: Vitals:   01/16/20 0500 01/16/20 0628 01/16/20 0700 01/16/20 0800  BP:  (!) 162/87  (!) 155/91  Pulse:  75 75 70  Resp:  17 17 19   Temp:    97.8 F (36.6 C)  TempSrc:    Oral  SpO2:  96% 94% 95%  Weight: 75 kg  Height:        Intake/Output Summary (Last 24 hours) at 01/16/2020 1132 Last data filed at 01/16/2020 0700 Gross per 24 hour  Intake 2828.42 ml  Output 1700 ml  Net 1128.42 ml    Filed Weights   01/11/20 2200 01/14/20 0400 01/16/20 0500  Weight: 75.1 kg 74.9 kg 75 kg    Examination:  General exam: Appears calm and comfortable  Respiratory system: Clear to auscultation. Respiratory effort normal. Cardiovascular system: S1 & S2 heard, RRR.  Gastrointestinal system: Abdomen is nondistended, soft and nontender. Central nervous system: Alert and awake Extremities: No edema. Skin: No rashes, lesions or ulcers Psychiatry: Judgement and insight appear normal. Mood & affect appropriate.     Data Reviewed: I have personally reviewed following labs and imaging studies  CBC: Recent Labs  Lab 01/11/20 1211 01/12/20 0609 01/13/20 0601 01/15/20 0212  WBC 13.8* 9.8 8.7 7.8  NEUTROABS 11.9*  --   --   --   HGB 14.2 13.5 13.4 12.7*  HCT RESULTS UNAVAILABLE DUE TO INTERFERING SUBSTANCE 37.4* 36.6* 36.3*  MCV RESULTS UNAVAILABLE DUE TO INTERFERING SUBSTANCE 88.8 87.8 90.8  PLT 220 195 193 219   Basic Metabolic Panel: Recent Labs  Lab 01/11/20 1533 01/11/20 1633 01/13/20 0601 01/13/20 0944 01/14/20 0515 01/14/20 1059 01/14/20 1742 01/14/20 2203 01/15/20 0212 01/15/20 0708 01/16/20 0330  NA 109*   < > 116*   < > 120*   < > 121* 124* 122* 125* 125*  K 3.5  --  3.3*  --  3.5  --   --   --  3.2*  --  3.0*  CL 76*  --  82*  --  87*  --   --   --  90*  --  89*  CO2 21*  --  24  --  23  --   --   --  25  --  26  GLUCOSE 99  --  83  --  99  --   --   --  111*  --  119*  BUN 7*  --  5*  --  <5*  --   --   --  <5*  --  7*  CREATININE 0.54*  --  0.58*  --  0.57*  --   --   --  0.54*  --  0.59*  CALCIUM 8.1*  --  8.1*  --  8.2*  --   --   --  7.8*  --  8.3*  MG  --   --   --   --  1.8  --   --   --   --   --   --   PHOS  --   --   --   --   --   --   --   --  3.2  --  3.3   < > = values in this interval not displayed.   GFR: Estimated Creatinine Clearance: 101.6 mL/min (A) (by C-G formula based on SCr of 0.59 mg/dL (L)). Liver Function Tests: Recent Labs   Lab 01/11/20 1211 01/13/20 0601 01/15/20 0212 01/16/20 0330  AST 79* 35 20  --   ALT 26 21 17   --   ALKPHOS 60 54 51  --   BILITOT 1.2 1.3* 1.0  --   PROT 7.0 6.1* 6.0*  --   ALBUMIN 3.5 3.0* 2.8* 2.7*   No results for input(s): LIPASE, AMYLASE in the last 168 hours. Recent Labs  Lab 01/11/20 1356  AMMONIA 24   Coagulation Profile: No results for input(s): INR, PROTIME in the last 168 hours. Cardiac Enzymes: No results for input(s): CKTOTAL, CKMB, CKMBINDEX, TROPONINI in the last 168 hours. BNP (last 3 results) No results for input(s): PROBNP in the last 8760 hours. HbA1C: No results for input(s): HGBA1C in the last 72 hours. CBG: No results for input(s): GLUCAP in the last 168 hours. Lipid Profile: No results for input(s): CHOL, HDL, LDLCALC, TRIG, CHOLHDL, LDLDIRECT in the last 72 hours. Thyroid Function Tests: No results for input(s): TSH, T4TOTAL, FREET4, T3FREE, THYROIDAB in the last 72 hours. Anemia Panel: No results for input(s): VITAMINB12, FOLATE, FERRITIN, TIBC, IRON, RETICCTPCT in the last 72 hours. Sepsis Labs: Recent Labs  Lab 01/11/20 1211 01/11/20 1356  LATICACIDVEN 2.8* 1.0    Recent Results (from the past 240 hour(s))  Blood culture (routine x 2)     Status: None   Collection Time: 01/11/20 12:10 PM   Specimen: BLOOD LEFT ARM  Result Value Ref Range Status   Specimen Description BLOOD LEFT ARM  Final   Special Requests   Final    BOTTLES DRAWN AEROBIC AND ANAEROBIC Blood Culture adequate volume   Culture   Final    NO GROWTH 5 DAYS Performed at [facility], [address] [place], [place] [address]    Report Status 01/16/2020 FINAL  Final  Blood culture (routine x 2)     Status: None   Collection Time: 01/11/20 12:11 PM   Specimen: BLOOD RIGHT FOREARM  Result Value Ref Range Status   Specimen Description BLOOD RIGHT FOREARM  Final   Special Requests Blood Culture adequate volume  Final   Culture   Final    NO GROWTH 5 DAYS Performed  at [facility], [address] [place], [place] [address]    Report Status 01/16/2020 FINAL  Final  Respiratory Panel by RT PCR (Flu A&B, Covid) - Nasopharyngeal Swab     Status: None   Collection Time: 01/11/20 12:25 PM   Specimen: Nasopharyngeal Swab  Result Value Ref Range Status   SARS Coronavirus 2 by RT PCR NEGATIVE NEGATIVE Final    Comment: (NOTE) SARS-CoV-2 target nucleic acids are NOT DETECTED.  The SARS-CoV-2 RNA is generally detectable in upper respiratoy specimens during the acute phase of infection. The lowest concentration of SARS-CoV-2 viral copies this assay can detect is 131 copies/mL. A negative result does not preclude SARS-Cov-2 infection and should not be used as the sole basis for treatment or other patient management decisions. A negative result may occur with  improper specimen collection/handling, submission of specimen other than nasopharyngeal swab, presence of viral mutation(s) within the areas targeted by this assay, and inadequate number of viral copies (<131 copies/mL). A negative result must be combined with clinical observations, patient history, and epidemiological information. The expected result is Negative.  Fact Sheet for Patients:  https://www.moore.com/https://www.fda.gov/media/142436/download  Fact Sheet for Healthcare Providers:  https://www.young.biz/https://www.fda.gov/media/142435/download  This test is no t yet approved or cleared by the [place] FDA and  has been authorized for detection and/or diagnosis of SARS-CoV-2 by FDA under an Emergency Use Authorization (EUA). This EUA will remain  in effect (meaning this test can be used) for the duration of the COVID-19 declaration under Section 564(b)(1) of the Act, 21 U.S.C. section 360bbb-3(b)(1), unless the authorization is terminated or revoked sooner.     Influenza A by PCR NEGATIVE NEGATIVE Final   Influenza B by PCR NEGATIVE NEGATIVE Final  Comment: (NOTE) The Xpert Xpress SARS-CoV-2/FLU/RSV assay is intended as  an aid in  the diagnosis of influenza from Nasopharyngeal swab specimens and  should not be used as a sole basis for treatment. Nasal washings and  aspirates are unacceptable for Xpert Xpress SARS-CoV-2/FLU/RSV  testing.  Fact Sheet for Patients: https://www.moore.com/  Fact Sheet for Healthcare Providers: https://www.young.biz/  This test is not yet approved or cleared by the [place] FDA and  has been authorized for detection and/or diagnosis of SARS-CoV-2 by  FDA under an Emergency Use Authorization (EUA). This EUA will remain  in effect (meaning this test can be used) for the duration of the  Covid-19 declaration under Section 564(b)(1) of the Act, 21  U.S.C. section 360bbb-3(b)(1), unless the authorization is  terminated or revoked. Performed at [facility], [address], [place], [place] [address]   MRSA PCR Screening     Status: None   Collection Time: 01/11/20  8:34 PM   Specimen: Nasal Mucosa; Nasopharyngeal  Result Value Ref Range Status   MRSA by PCR NEGATIVE NEGATIVE Final    Comment:        The GeneXpert MRSA Assay (FDA approved for NASAL specimens only), is one component of a comprehensive MRSA colonization surveillance program. It is not intended to diagnose MRSA infection nor to guide or monitor treatment for MRSA infections. Performed at [facility], [address], [place], [place] [address]          Radiology Studies: No results found.      Scheduled Meds: . Chlorhexidine Gluconate Cloth  6 each Topical Daily  . folic acid  1 mg Oral Daily  . heparin  5,000 Units Subcutaneous Q8H  . multivitamin with minerals  1 tablet Oral Daily  . nicotine  21 mg Transdermal Daily  . pantoprazole  40 mg Intravenous Q12H  . thiamine  100 mg Oral Daily   Or  . thiamine  100 mg Intravenous Daily   Continuous Infusions: . sodium chloride 75 mL/hr at 01/16/20 0910     LOS: 5 days    Time spent: 35  minutes    Gurshan Settlemire Hoover Brunette, DO [facility]  If 7PM-7AM, please contact night-coverage [email or web address] 01/16/2020, 11:32 AM

## 2020-01-17 ENCOUNTER — Telehealth: Payer: Self-pay | Admitting: Gastroenterology

## 2020-01-17 DIAGNOSIS — K921 Melena: Secondary | ICD-10-CM | POA: Diagnosis not present

## 2020-01-17 DIAGNOSIS — F1010 Alcohol abuse, uncomplicated: Secondary | ICD-10-CM | POA: Diagnosis not present

## 2020-01-17 DIAGNOSIS — R945 Abnormal results of liver function studies: Secondary | ICD-10-CM | POA: Diagnosis not present

## 2020-01-17 DIAGNOSIS — E871 Hypo-osmolality and hyponatremia: Secondary | ICD-10-CM | POA: Diagnosis not present

## 2020-01-17 LAB — CBC
HCT: 35.2 % — ABNORMAL LOW (ref 39.0–52.0)
Hemoglobin: 12.4 g/dL — ABNORMAL LOW (ref 13.0–17.0)
MCH: 32 pg (ref 26.0–34.0)
MCHC: 35.2 g/dL (ref 30.0–36.0)
MCV: 91 fL (ref 80.0–100.0)
Platelets: 258 10*3/uL (ref 150–400)
RBC: 3.87 MIL/uL — ABNORMAL LOW (ref 4.22–5.81)
RDW: 12.3 % (ref 11.5–15.5)
WBC: 8.1 10*3/uL (ref 4.0–10.5)
nRBC: 0 % (ref 0.0–0.2)

## 2020-01-17 LAB — BASIC METABOLIC PANEL
Anion gap: 9 (ref 5–15)
BUN: 6 mg/dL — ABNORMAL LOW (ref 8–23)
CO2: 27 mmol/L (ref 22–32)
Calcium: 8.7 mg/dL — ABNORMAL LOW (ref 8.9–10.3)
Chloride: 93 mmol/L — ABNORMAL LOW (ref 98–111)
Creatinine, Ser: 0.56 mg/dL — ABNORMAL LOW (ref 0.61–1.24)
GFR, Estimated: >60 mL/min (ref >60)
Glucose, Bld: 112 mg/dL — ABNORMAL HIGH (ref 70–99)
Potassium: 3.2 mmol/L — ABNORMAL LOW (ref 3.5–5.1)
Sodium: 129 mmol/L — ABNORMAL LOW (ref 135–145)

## 2020-01-17 LAB — RESPIRATORY PANEL BY RT PCR (FLU A&B, COVID)
Influenza A by PCR: NEGATIVE
Influenza B by PCR: NEGATIVE
SARS Coronavirus 2 by RT PCR: NEGATIVE

## 2020-01-17 LAB — MAGNESIUM: Magnesium: 1.7 mg/dL (ref 1.7–2.4)

## 2020-01-17 MED ORDER — MAGNESIUM SULFATE IN D5W 1-5 GM/100ML-% IV SOLN
1.0000 g | Freq: Once | INTRAVENOUS | Status: AC
  Administered 2020-01-17: 1 g via INTRAVENOUS
  Filled 2020-01-17: qty 100

## 2020-01-17 MED ORDER — POTASSIUM CHLORIDE CRYS ER 20 MEQ PO TBCR
40.0000 meq | EXTENDED_RELEASE_TABLET | Freq: Two times a day (BID) | ORAL | Status: AC
  Administered 2020-01-17 (×2): 40 meq via ORAL
  Filled 2020-01-17 (×2): qty 2

## 2020-01-17 NOTE — Telephone Encounter (Signed)
Please schedule routine hospital follow up with Dr. Marletta Lor, Angie Fava, or me for heme + stool.

## 2020-01-17 NOTE — Progress Notes (Signed)
Physical Therapy Treatment Patient Details Name: Barry Fowler MRN: [id number] DOB: 1955-10-25 Today's Date: 01/17/2020    History of Present Illness Barry Fowler  is a 64 y.o. male, with past medical history of hypertension, hyperlipidemia, tobacco abuse, 1 pack/day for last 30 years, sister at bedside assist with the history, patient was started last month on losartan for high blood pressure, patient with heavy alcohol abuse, up to 12 beers per day, patient presents to ED secondary couple weeks of generalized weakness, as well for last few days he had diminished appetite, and there was some questionable left-sided facial droop, as well as some nausea, and one episode of dark bowel movement, which prompted family to call EMS, patient lives with his nephew, patient denies any chest pain, denies any shortness of breath, headache, abdominal pain, vomiting, coffee-ground emesis.    PT Comments    Patient seated in chair at end of session. Patient requires min assist to transfer to standing with RW and is unsteady upon standing. Patient ambulates increased distance today but with continued unsteady gait with RW requiring assist for balance. Patient at an increased risk for falling due to unsteadiness with ambulation. Patient ends session seated in chair - RN aware. Patient will benefit from continued physical therapy in hospital and recommended venue below to increase strength, balance, endurance for safe ADLs and gait.    Follow Up Recommendations  SNF;Supervision for mobility/OOB;Supervision/Assistance - 24 hour     Equipment Recommendations  Rolling walker with 5" wheels    Recommendations for Other Services       Precautions / Restrictions Precautions Precautions: Fall Restrictions Weight Bearing Restrictions: No    Mobility  Bed Mobility               General bed mobility comments: Not completed as patient was seated in chair  Transfers Overall transfer level: Needs  assistance Equipment used: Rolling walker (2 wheeled) Transfers: Sit to/from Stand;Stand Pivot Transfers Sit to Stand: Min assist Stand pivot transfers: Min assist       General transfer comment: min assist to transition to seated, unsteady upon standing  Ambulation/Gait Ambulation/Gait assistance: Min assist;Mod assist Gait Distance (Feet): 90 Feet Assistive device: Rolling walker (2 wheeled) Gait Pattern/deviations: Decreased step length - right;Decreased step length - left;Trunk flexed Gait velocity: decreased   General Gait Details: slow labored unsteady cadence with frequent near loss of balance, bumping into several objects, walls with RW; intermittent leg trembling, appears to be increased risk for falling   Stairs             Wheelchair Mobility    Modified Rankin (Stroke Patients Only)       Balance Overall balance assessment: Needs assistance Sitting-balance support: Feet supported;No upper extremity supported Sitting balance-Leahy Scale: Fair Sitting balance - Comments: sitting EOB   Standing balance support: Bilateral upper extremity supported;During functional activity Standing balance-Leahy Scale: Fair Standing balance comment: fair/poor using RW                            Cognition Arousal/Alertness: Awake/alert Behavior During Therapy: WFL for tasks assessed/performed Overall Cognitive Status: Within Functional Limits for tasks assessed                                        Exercises      General Comments  Pertinent Vitals/Pain Pain Assessment: No/denies pain    Home Living                      Prior Function            PT Goals (current goals can now be found in the care plan section) Acute Rehab PT Goals Patient Stated Goal: return home with family to assist PT Goal Formulation: With patient Time For Goal Achievement: 01/28/20 Potential to Achieve Goals: Good Progress towards PT  goals: Progressing toward goals    Frequency    Min 3X/week      PT Plan Current plan remains appropriate    Co-evaluation              AM-PAC PT "6 Clicks" Mobility   Outcome Measure  Help needed turning from your back to your side while in a flat bed without using bedrails?: A Little Help needed moving from lying on your back to sitting on the side of a flat bed without using bedrails?: A Little Help needed moving to and from a bed to a chair (including a wheelchair)?: A Lot Help needed standing up from a chair using your arms (e.g., wheelchair or bedside chair)?: A Little Help needed to walk in hospital room?: A Lot Help needed climbing 3-5 steps with a railing? : A Lot 6 Click Score: 15    End of Session   Activity Tolerance: Patient tolerated treatment well;Patient limited by fatigue Patient left: in chair;with call bell/phone within reach;with chair alarm set Nurse Communication: Mobility status PT Visit Diagnosis: Unsteadiness on feet (R26.81);Other abnormalities of gait and mobility (R26.89);Muscle weakness (generalized) (M62.81)     Time: 1062-6948 PT Time Calculation (min) (ACUTE ONLY): 10 min  Charges:  $Therapeutic Activity: 8-22 mins                     3:03 PM, 01/17/20 Wyman Songster PT, DPT Physical Therapist at [facility]

## 2020-01-17 NOTE — Progress Notes (Signed)
[facility] Progress Note    Assessment/ Plan:   1. Hyponatremia, severe- Uosm 211, Sosm 225, Urine Na 15 consistent with poor solute intake and beer potomania. Improving with IV normal saline and is now up to 125 (from 106 on 01/11/20). ARB held.  1. Sodium up to 129 this morning. 2. Stopping IVFs today. 3. No fluid or salt restriction 2. Acute metabolic encephalopathy- CT scan of head negative. Slowly improving.  3. Alcohol abuse- continue with CIWA protocol per primary 4. HTN- actually had hypotension and meds on hold. BP stable for now. 5. Heme + stools per primary. Hgb stable.  6. Hypokalemia: replete prn 7. Dispo: if can maintain/ improve serum Na off IVFs, can d/c tomorrow from renal perspective.  Subjective:    Na 129 this AM.  Ate all of breakfast.     Objective:   BP (!) 143/84   Pulse 73   Temp 98.2 F (36.8 C) (Oral)   Resp (!) 25   Ht 6' (1.829 m)   Wt 75 kg   SpO2 92%   BMI 22.42 kg/m   Intake/Output Summary (Last 24 hours) at 01/17/2020 1023 Last data filed at 01/17/2020 0700 Gross per 24 hour  Intake 1779.2 ml  Output 1600 ml  Net 179.2 ml   Weight change:   Physical Exam: Gen: NAD, sitting up in chair CVS: RRR Resp: clear Abd: no tenderness Ext: no LE edema NEURO: AAO x 3   Imaging: No results found.  Labs: BMET Recent Labs  Lab 01/11/20 1211 01/11/20 1211 01/11/20 1533 01/11/20 1633 01/13/20 0601 01/13/20 0944 01/14/20 0515 01/14/20 1059 01/14/20 1431 01/14/20 1742 01/14/20 2203 01/15/20 0212 01/15/20 0708 01/16/20 0330 01/17/20 0444  NA 106*   < > 109*   < > 116*   < > 120*   < > 122* 121* 124* 122* 125* 125* 129*  K 3.6  --  3.5  --  3.3*  --  3.5  --   --   --   --  3.2*  --  3.0* 3.2*  CL 75*  --  76*  --  82*  --  87*  --   --   --   --  90*  --  89* 93*  CO2 20*  --  21*  --  24  --  23  --   --   --   --  25  --  26 27  GLUCOSE 128*  --  99  --  83  --  99  --   --   --   --  111*  --  119*  112*  BUN 8  --  7*  --  5*  --  <5*  --   --   --   --  <5*  --  7* 6*  CREATININE 0.58*  --  0.54*  --  0.58*  --  0.57*  --   --   --   --  0.54*  --  0.59* 0.56*  CALCIUM 8.2*  --  8.1*  --  8.1*  --  8.2*  --   --   --   --  7.8*  --  8.3* 8.7*  PHOS  --   --   --   --   --   --   --   --   --   --   --  3.2  --  3.3  --    < > =  values in this interval not displayed.   CBC Recent Labs  Lab 01/11/20 1211 01/12/20 0609 01/13/20 0601 01/15/20 0212 01/16/20 1402 01/17/20 0444  WBC 13.8*   < > 8.7 7.8 8.6 8.1  NEUTROABS 11.9*  --   --   --   --   --   HGB 14.2   < > 13.4 12.7* 12.7* 12.4*  HCT RESULTS UNAVAILABLE DUE TO INTERFERING SUBSTANCE   < > 36.6* 36.3* 36.7* 35.2*  MCV RESULTS UNAVAILABLE DUE TO INTERFERING SUBSTANCE   < > 87.8 90.8 92.2 91.0  PLT 220   < > 193 219 251 258   < > = values in this interval not displayed.    Medications:    . Chlorhexidine Gluconate Cloth  6 each Topical Daily  . folic acid  1 mg Oral Daily  . heparin  5,000 Units Subcutaneous Q8H  . multivitamin with minerals  1 tablet Oral Daily  . nicotine  21 mg Transdermal Daily  . pantoprazole  40 mg Intravenous Q12H  . thiamine  100 mg Oral Daily   Or  . thiamine  100 mg Intravenous Daily      Bufford Buttner, MD 01/17/2020, 10:23 AM

## 2020-01-17 NOTE — TOC Progression Note (Addendum)
Transition of Care [facility]) - Progression Note   Patient Details  Name: Barry Fowler MRN: [id number] Date of Birth: 1955/09/30  Transition of Care [facility]) CM/SW Contact  Ewing Schlein, LCSW Phone Number: 01/17/2020, 10:40 AM  Clinical Narrative: CSW called [employer] to complete insurance authorization. Authorization ID is: [id number]; reference ID is: [id number]. The start date for authorization is 01/16/20 and the next review date is 01/18/2020. Patient's care coordinator will be Conni Slipper and clinicals will be faxed to [phone]. TOC to follow.  Addendum: 12:35pm-Deborah in admissions with [facility] confirmed patient can come tomorrow if everything is correct with the insurance authorization.  Expected Discharge Plan: Skilled Nursing Facility Barriers to Discharge: Continued Medical Work up  Expected Discharge Plan and Services Expected Discharge Plan: Skilled Nursing Facility Living arrangements for the past 2 months: Single Family Home  Readmission Risk Interventions No flowsheet data found.

## 2020-01-17 NOTE — Progress Notes (Signed)
Subjective:  Tolerating diet. Denies abdominal pain, n/v. States stool normal in color.   Objective: Vital signs in last 24 hours: Temp:  [98 F (36.7 C)-98.2 F (36.8 C)] 98.2 F (36.8 C) (10/21 0600) Pulse Rate:  [73] 73 (10/21 0700) Resp:  [11-26] 26 (10/21 1000) BP: (122-163)/(69-108) 143/84 (10/21 0600) SpO2:  [92 %] 92 % (10/20 2009) Last BM Date: 01/16/20 General:   Alert,  Well-developed, well-nourished, pleasant and cooperative in NAD. Sitting up in chair.  Head:  Normocephalic and atraumatic. Eyes:  Sclera clear, no icterus.   Abdomen:  Soft  . Neurologic:  Alert and  oriented x4;   Psych:  Alert and cooperative. Normal mood and affect.  Intake/Output from previous day: 10/20 0701 - 10/21 0700 In: 1779.2 [I.V.:1779.2] Out: 1600 [Urine:1600] Intake/Output this shift: No intake/output data recorded.  Lab Results: CBC Recent Labs    01/15/20 0212 01/16/20 1402 01/17/20 0444  WBC 7.8 8.6 8.1  HGB 12.7* 12.7* 12.4*  HCT 36.3* 36.7* 35.2*  MCV 90.8 92.2 91.0  PLT 219 251 258   BMET Recent Labs    01/15/20 0212 01/15/20 0212 01/15/20 0708 01/16/20 0330 01/17/20 0444  NA 122*   < > 125* 125* 129*  K 3.2*  --   --  3.0* 3.2*  CL 90*  --   --  89* 93*  CO2 25  --   --  26 27  GLUCOSE 111*  --   --  119* 112*  BUN <5*  --   --  7* 6*  CREATININE 0.54*  --   --  0.59* 0.56*  CALCIUM 7.8*  --   --  8.3* 8.7*   < > = values in this interval not displayed.   LFTs Recent Labs    01/15/20 0212 01/16/20 0330  BILITOT 1.0  --   ALKPHOS 51  --   AST 20  --   ALT 17  --   PROT 6.0*  --   ALBUMIN 2.8* 2.7*   No results for input(s): LIPASE in the last 72 hours. PT/INR No results for input(s): LABPROT, INR in the last 72 hours.    Imaging Studies: CT Head Wo Contrast  Result Date: 01/11/2020 CLINICAL DATA:  Weakness.  Left facial droop. EXAM: CT HEAD WITHOUT CONTRAST TECHNIQUE: Contiguous axial images were obtained from the base of the skull through  the vertex without intravenous contrast. COMPARISON:  MRI brain dated January 19, 2019. CT head dated March 21, 2018. FINDINGS: Brain: No evidence of acute infarction, hemorrhage, hydrocephalus, extra-axial collection or mass lesion/mass effect. Stable mild atrophy. Vascular: Calcified atherosclerosis at the skullbase. No hyperdense vessel. Skull: Normal. Negative for fracture or focal lesion. Sinuses/Orbits: No acute finding. Both ocular lenses are posteriorly displaced, more so on the left. Other: None. IMPRESSION: 1. No acute intracranial abnormality. 2. 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 Electronically Signed   By: Obie Dredge M.D.   On: 01/11/2020 12:49   US Abdomen Complete  Result Date: 01/13/2020 CLINICAL DATA:  Abnormal liver function tests. EXAM: ABDOMEN ULTRASOUND COMPLETE COMPARISON:  None. FINDINGS: Gallbladder: No gallstones or wall thickening visualized. No sonographic Murphy sign noted by sonographer. Common bile duct: Diameter: 3 mm Liver: Moderately increased parenchymal echogenicity diffusely without a focal lesion identified. Portal vein is patent on color Doppler imaging with normal direction of blood flow towards the liver. IVC: No abnormality visualized. Pancreas: Visualized portion unremarkable. Spleen: Size and appearance within normal limits. Right  Kidney: Length: 12.6 cm. Echogenicity within normal limits. No mass or hydronephrosis visualized. Left Kidney: Length: 11.5 cm. Echogenicity within normal limits. No mass or hydronephrosis visualized. Abdominal aorta: No aneurysm visualized with the aortic bifurcation being obscured. Other findings: None. IMPRESSION: 1. Echogenic liver, nonspecific though often seen with steatosis. 2. Otherwise unremarkable abdominal ultrasound. Electronically Signed   By: Sebastian Ache M.D.   On: 01/13/2020 14:20   DG Chest Port 1 View  Result Date: 01/11/2020 CLINICAL DATA:  64 year old male with weakness for 3  months. Left side facial droop. EXAM: PORTABLE CHEST 1 VIEW COMPARISON:  Chest CT 07/03/2019. FINDINGS: Portable AP semi upright view at 1614 hours. Large lung volumes. Mediastinal contours are within normal limits. Visualized tracheal air column is within normal limits. Allowing for portable technique the lungs are clear. No pneumothorax. No acute osseous abnormality identified. Chronic right lower rib fractures. IMPRESSION: Pulmonary hyperinflation.  No acute cardiopulmonary abnormality. Electronically Signed   By: Odessa Fleming M.D.   On: 01/11/2020 16:23  [2 weeks]   Assessment: 64 year old male with history of alcohol abuse, presented to the ED with generalized weakness, questionable melena.    Questionable melena.  Dark heme positive stool noted this admission.  History of Mobic as an outpatient.  Hemoglobin on admission 14.2, down to 12.4 (essentially stable the last 48 hours), likely in part due to dilution.  Hyponatremia: Profound on admission with sodium of 106 due to poor solute intake and beer potomania. Continues to improve with sodium of 129 currently.  Nephrology following.  Elevated AST: Noted on admission, due to alcohol use.  LFTs are normal now.  Acute hepatitis panel negative.  Fatty liver noted on ultrasound.  No overt cirrhosis noted.   Plan: 1. EGD previously recommended once hyponatremia improved with sodium greater than 130.  Patient now declining, does not want to pursue EGD. 2. Continue PPI twice daily. 3. Monitor for further overt GI bleeding. 4. Offer patient an outpatient follow-up, can discuss possibility of colonoscopy for screening or pursuing upper endoscopy however I doubt patient consents. 5. Will sign off, call with questions.   Leanna Battles. Dixon Boos [facility] [phone] 10/21/202112:27 PM     LOS: 6 days

## 2020-01-17 NOTE — Progress Notes (Signed)
PROGRESS NOTE    Barry Fowler  OZD:[id number] DOB: Aug 13, 1955 DOA: 01/11/2020 PCP: Patient, No Pcp Per   Brief Narrative:  [name],with past medical history of hypertension, hyperlipidemia, tobacco abuse, 1 pack/day for last 30 years, sister at bedside assist with the history, patient was started last month on losartan for high blood pressure, patient with heavy alcohol abuse, up to 12 beers per day, patient presents to ED secondary couple weeks of generalized weakness, as well for last few days he had diminished appetite, and there was some questionable left-sided facial droop, as well as some nausea, and one episode of dark bowel movement, which prompted family to call EMS, patient lives with his nephew, patient denies any chest pain, denies any shortness of breath, headache, abdominal pain, vomiting, coffee-ground emesis. - in EDpatient was noted to have sodium of 106, cultures pending, but hemoglobin is stable at 13.2, his potassium is 3.6, CT head with no acute findings, COVID-19 test has been negative, UA with no acute finding, negative urine drug screen, UA and chest x-ray is pending.  10/20: Patient continues to have minimal improvement in sodium levels at 125 this morning.  Per nephrology, plan to continue ongoing IV infusion and close monitoring.  Advance diet as tolerated with no plans for endoscopy while inpatient.  Hemoglobin will be reevaluated in a.m.  No overt bleeding noted.  10/21: Sodium levels continue to further improve up to 129 this morning and patient is overall feeling much better.  Plan to discontinue IV fluid as discussed with nephrology and ensure stability prior to anticipated discharge in a.m.  Bowel movements with no overt bleeding noted and stable hemoglobin levels.  Assessment & Plan:   Active Problems:   Hyponatremia   Alcohol abuse   Abnormal LFTs (liver function tests)   Melena   Hyponatremia-improving -Likely related to beer  drinkers potomania -Sodium levels are responding to saline and are slowly trending up -Appreciate nephrology assistance -IV fluid discontinued on 10/21, plan to ensure stability by 10/22 with anticipated discharge to SNF.  Hypokalemia -Replete and re-evaluate in am -Plan to supplement some magnesium as well  Acute metabolic encephalopathy-improved -CT head negative -Likely secondary to above -Appears to be improving  Alcohol abuse -No signs of withdrawal at this time -Continue on CIWA protocol  Hypertension-stable -Losartan currently on hold -Blood pressures are stable -Continue to monitor  Hyperlipidemia -Resume statin when stable and tolerating diet  GI bleeding-appears resolved -Patient noted to have dark-colored stools that were heme positive -He was taking Mobic prior to admission, avoid on dc -Continue on Protonix BID -Per GI, EGD once Na 130 or greater, will keep npo after MN -Started on soft diet for today  Elevated LFTs-resolved -Abdominal ultrasound negative -Although suspect this is related to alcohol -Viral hepatitis panel negative  DVT prophylaxis:heparin injection 5,000 Units Start: 01/11/20 1645 SCDs Start: 01/11/20 1637  Code Status:Full code Family Communication:Discussed with patient Disposition Plan:Status is: Inpatient  Remains inpatient appropriate because:IV treatments appropriate due to intensity of illness or inability to take PO   Dispo: The patient is from: Home Anticipated d/c is to:SNF Anticipated d/c date is: 1-2 days Patient currently is not medically stable to d/c. OK for transfer to med-surg.  Anticipate discharge by 10/22 stable and improved.   Consultants:  Nephrology  Gastroenterology  Procedures:   None  Antimicrobials:   None   Subjective: Patient seen and evaluated today with no new acute complaints or concerns. No acute concerns or events noted  overnight.  He states he is overall feeling much better and has much less weakness.  Objective: Vitals:   01/17/20 0600 01/17/20 0700 01/17/20 0900 01/17/20 1000  BP: (!) 143/84     Pulse:  73    Resp: (!) 25  (!) 23 (!) 26  Temp: 98.2 F (36.8 C)     TempSrc: Oral     SpO2:      Weight:      Height:        Intake/Output Summary (Last 24 hours) at 01/17/2020 1048 Last data filed at 01/17/2020 0700 Gross per 24 hour  Intake 1779.2 ml  Output 1600 ml  Net 179.2 ml   Filed Weights   01/11/20 2200 01/14/20 0400 01/16/20 0500  Weight: 75.1 kg 74.9 kg 75 kg    Examination:  General exam: Appears calm and comfortable  Respiratory system: Clear to auscultation. Respiratory effort normal. Cardiovascular system: S1 & S2 heard, RRR.  Gastrointestinal system: Abdomen is nondistended, soft and nontender.  Central nervous system: Alert and oriented. No focal neurological deficits. Extremities: Symmetric 5 x 5 power. Skin: No rashes, lesions or ulcers Psychiatry: Judgement and insight appear normal. Mood & affect appropriate.     Data Reviewed: I have personally reviewed following labs and imaging studies  CBC: Recent Labs  Lab 01/11/20 1211 01/11/20 1211 01/12/20 0609 01/13/20 0601 01/15/20 0212 01/16/20 1402 01/17/20 0444  WBC 13.8*   < > 9.8 8.7 7.8 8.6 8.1  NEUTROABS 11.9*  --   --   --   --   --   --   HGB 14.2   < > 13.5 13.4 12.7* 12.7* 12.4*  HCT RESULTS UNAVAILABLE DUE TO INTERFERING SUBSTANCE   < > 37.4* 36.6* 36.3* 36.7* 35.2*  MCV RESULTS UNAVAILABLE DUE TO INTERFERING SUBSTANCE   < > 88.8 87.8 90.8 92.2 91.0  PLT 220   < > 195 193 219 251 258   < > = values in this interval not displayed.   Basic Metabolic Panel: Recent Labs  Lab 01/13/20 0601 01/13/20 0944 01/14/20 0515 01/14/20 1059 01/14/20 2203 01/15/20 0212 01/15/20 0708 01/16/20 0330 01/17/20 0444  NA 116*   < > 120*   < > 124* 122* 125* 125* 129*  K 3.3*  --  3.5  --   --  3.2*  --  3.0*  3.2*  CL 82*  --  87*  --   --  90*  --  89* 93*  CO2 24  --  23  --   --  25  --  26 27  GLUCOSE 83  --  99  --   --  111*  --  119* 112*  BUN 5*  --  <5*  --   --  <5*  --  7* 6*  CREATININE 0.58*  --  0.57*  --   --  0.54*  --  0.59* 0.56*  CALCIUM 8.1*  --  8.2*  --   --  7.8*  --  8.3* 8.7*  MG  --   --  1.8  --   --   --   --   --  1.7  PHOS  --   --   --   --   --  3.2  --  3.3  --    < > = values in this interval not displayed.   GFR: Estimated Creatinine Clearance: 101.6 mL/min (A) (by C-G formula based on SCr of 0.56 mg/dL (L)).  Liver Function Tests: Recent Labs  Lab 01/11/20 1211 01/13/20 0601 01/15/20 0212 01/16/20 0330  AST 79* 35 20  --   ALT 26 21 17   --   ALKPHOS 60 54 51  --   BILITOT 1.2 1.3* 1.0  --   PROT 7.0 6.1* 6.0*  --   ALBUMIN 3.5 3.0* 2.8* 2.7*   No results for input(s): LIPASE, AMYLASE in the last 168 hours. Recent Labs  Lab 01/11/20 1356  AMMONIA 24   Coagulation Profile: No results for input(s): INR, PROTIME in the last 168 hours. Cardiac Enzymes: No results for input(s): CKTOTAL, CKMB, CKMBINDEX, TROPONINI in the last 168 hours. BNP (last 3 results) No results for input(s): PROBNP in the last 8760 hours. HbA1C: No results for input(s): HGBA1C in the last 72 hours. CBG: No results for input(s): GLUCAP in the last 168 hours. Lipid Profile: No results for input(s): CHOL, HDL, LDLCALC, TRIG, CHOLHDL, LDLDIRECT in the last 72 hours. Thyroid Function Tests: No results for input(s): TSH, T4TOTAL, FREET4, T3FREE, THYROIDAB in the last 72 hours. Anemia Panel: No results for input(s): VITAMINB12, FOLATE, FERRITIN, TIBC, IRON, RETICCTPCT in the last 72 hours. Sepsis Labs: Recent Labs  Lab 01/11/20 1211 01/11/20 1356  LATICACIDVEN 2.8* 1.0    Recent Results (from the past 240 hour(s))  Blood culture (routine x 2)     Status: None   Collection Time: 01/11/20 12:10 PM   Specimen: BLOOD LEFT ARM  Result Value Ref Range Status   Specimen  Description BLOOD LEFT ARM  Final   Special Requests   Final    BOTTLES DRAWN AEROBIC AND ANAEROBIC Blood Culture adequate volume   Culture   Final    NO GROWTH 5 DAYS Performed at [facility], [address] [place], [place] [address]    Report Status 01/16/2020 FINAL  Final  Blood culture (routine x 2)     Status: None   Collection Time: 01/11/20 12:11 PM   Specimen: BLOOD RIGHT FOREARM  Result Value Ref Range Status   Specimen Description BLOOD RIGHT FOREARM  Final   Special Requests Blood Culture adequate volume  Final   Culture   Final    NO GROWTH 5 DAYS Performed at [facility], [address] [place], [place] [address]    Report Status 01/16/2020 FINAL  Final  Respiratory Panel by RT PCR (Flu A&B, Covid) - Nasopharyngeal Swab     Status: None   Collection Time: 01/11/20 12:25 PM   Specimen: Nasopharyngeal Swab  Result Value Ref Range Status   SARS Coronavirus 2 by RT PCR NEGATIVE NEGATIVE Final    Comment: (NOTE) SARS-CoV-2 target nucleic acids are NOT DETECTED.  The SARS-CoV-2 RNA is generally detectable in upper respiratoy specimens during the acute phase of infection. The lowest concentration of SARS-CoV-2 viral copies this assay can detect is 131 copies/mL. A negative result does not preclude SARS-Cov-2 infection and should not be used as the sole basis for treatment or other patient management decisions. A negative result may occur with  improper specimen collection/handling, submission of specimen other than nasopharyngeal swab, presence of viral mutation(s) within the areas targeted by this assay, and inadequate number of viral copies (<131 copies/mL). A negative result must be combined with clinical observations, patient history, and epidemiological information. The expected result is Negative.  Fact Sheet for Patients:  https://www.moore.com/https://www.fda.gov/media/142436/download  Fact Sheet for Healthcare Providers:  https://www.young.biz/https://www.fda.gov/media/142435/download  This test  is no t yet approved or cleared by the [place] FDA  and  has been authorized for detection and/or diagnosis of SARS-CoV-2 by FDA under an Emergency Use Authorization (EUA). This EUA will remain  in effect (meaning this test can be used) for the duration of the COVID-19 declaration under Section 564(b)(1) of the Act, 21 U.S.C. section 360bbb-3(b)(1), unless the authorization is terminated or revoked sooner.     Influenza A by PCR NEGATIVE NEGATIVE Final   Influenza B by PCR NEGATIVE NEGATIVE Final    Comment: (NOTE) The Xpert Xpress SARS-CoV-2/FLU/RSV assay is intended as an aid in  the diagnosis of influenza from Nasopharyngeal swab specimens and  should not be used as a sole basis for treatment. Nasal washings and  aspirates are unacceptable for Xpert Xpress SARS-CoV-2/FLU/RSV  testing.  Fact Sheet for Patients: https://www.moore.com/  Fact Sheet for Healthcare Providers: https://www.young.biz/  This test is not yet approved or cleared by the [place] FDA and  has been authorized for detection and/or diagnosis of SARS-CoV-2 by  FDA under an Emergency Use Authorization (EUA). This EUA will remain  in effect (meaning this test can be used) for the duration of the  Covid-19 declaration under Section 564(b)(1) of the Act, 21  U.S.C. section 360bbb-3(b)(1), unless the authorization is  terminated or revoked. Performed at [facility], [address], [place], [place] [address]   MRSA PCR Screening     Status: None   Collection Time: 01/11/20  8:34 PM   Specimen: Nasal Mucosa; Nasopharyngeal  Result Value Ref Range Status   MRSA by PCR NEGATIVE NEGATIVE Final    Comment:        The GeneXpert MRSA Assay (FDA approved for NASAL specimens only), is one component of a comprehensive MRSA colonization surveillance program. It is not intended to diagnose MRSA infection nor to guide or monitor treatment for MRSA infections. Performed  at [facility], [address] [place], [place] [address]          Radiology Studies: No results found.      Scheduled Meds: . Chlorhexidine Gluconate Cloth  6 each Topical Daily  . folic acid  1 mg Oral Daily  . heparin  5,000 Units Subcutaneous Q8H  . multivitamin with minerals  1 tablet Oral Daily  . nicotine  21 mg Transdermal Daily  . pantoprazole  40 mg Intravenous Q12H  . thiamine  100 mg Oral Daily   Or  . thiamine  100 mg Intravenous Daily    LOS: 6 days    Time spent: 30 minutes    Alonso Gapinski Hoover Brunette, DO [facility]  If 7PM-7AM, please contact night-coverage [email or web address] 01/17/2020, 10:48 AM

## 2020-01-17 NOTE — Progress Notes (Signed)
Occupational Therapy Treatment Patient Details Name: Barry Fowler MRN: [id number] DOB: 10-05-1955 Today's Date: 01/17/2020    History of present illness Barry Fowler  is a 64 y.o. male, with past medical history of hypertension, hyperlipidemia, tobacco abuse, 1 pack/day for last 30 years, sister at bedside assist with the history, patient was started last month on losartan for high blood pressure, patient with heavy alcohol abuse, up to 12 beers per day, patient presents to ED secondary couple weeks of generalized weakness, as well for last few days he had diminished appetite, and there was some questionable left-sided facial droop, as well as some nausea, and one episode of dark bowel movement, which prompted family to call EMS, patient lives with his nephew, patient denies any chest pain, denies any shortness of breath, headache, abdominal pain, vomiting, coffee-ground emesis.   OT comments  Pt sitting up in bed on OT arrival, agreeable to participate in therapeutic exercises this am. Educated pt on importance of building and maintaining BUE strength to improve safety and participation in ADLs and mobility tasks. Pt verbalized understanding. Pt provided with printout of BUE HEP, completing 5/6 exercises with initial visual cuing for form. Verbally educated on chair push ups. Discharge plan remains appropriate.    Follow Up Recommendations  SNF    Equipment Recommendations  None recommended by OT       Precautions / Restrictions Precautions Precautions: Fall Restrictions Weight Bearing Restrictions: No       Mobility Bed Mobility               General bed mobility comments: Not completed  Transfers                 General transfer comment: Not completed        ADL either performed or assessed with clinical judgement   ADL                                         General ADL Comments: Pt declined ADL completion this am, stating he'd rather wait  until after breakfast     Vision   Vision Assessment?: No apparent visual deficits          Cognition Arousal/Alertness: Awake/alert Behavior During Therapy: WFL for tasks assessed/performed Overall Cognitive Status: Within Functional Limits for tasks assessed                                          Exercises Exercises: General Upper Extremity;Other exercises General Exercises - Upper Extremity Shoulder Flexion: AROM;10 reps Shoulder Extension: AROM;10 reps Shoulder ABduction: AROM;10 reps Shoulder ADduction: AROM;10 reps Shoulder Horizontal ABduction: AROM;10 reps Shoulder Horizontal ADduction: AROM;10 reps Elbow Flexion: AROM;10 reps Elbow Extension: AROM;10 reps Other Exercises Other Exercises: shoulder protraction, A/ROM, 10X           Pertinent Vitals/ Pain       Pain Assessment: No/denies pain     Prior Functioning/Environment              Frequency  Min 2X/week        Progress Toward Goals  OT Goals(current goals can now be found in the care plan section)  Progress towards OT goals: Progressing toward goals  Acute Rehab OT Goals Patient Stated Goal: return home with family to assist OT  Goal Formulation: With patient Time For Goal Achievement: 01/28/20 Potential to Achieve Goals: Good ADL Goals Pt Will Perform Grooming: with modified independence;sitting Pt Will Perform Upper Body Bathing: with modified independence;sitting Pt Will Perform Lower Body Bathing: with supervision;sit to/from stand;sitting/lateral leans Pt Will Perform Upper Body Dressing: with modified independence;sitting Pt Will Perform Lower Body Dressing: with supervision;sit to/from stand;sitting/lateral leans Pt Will Transfer to Toilet: with supervision;ambulating;bedside commode Pt Will Perform Toileting - Clothing Manipulation and hygiene: with supervision;sit to/from stand;sitting/lateral leans Pt/caregiver will Perform Home Exercise Program: Increased  strength;Both right and left upper extremity;With Supervision;With written HEP provided  Plan Discharge plan remains appropriate          End of Session    OT Visit Diagnosis: Muscle weakness (generalized) (M62.81)   Activity Tolerance Patient tolerated treatment well   Patient Left in bed;with call bell/phone within reach;with bed alarm set   Nurse Communication          Time: 4166-0630 OT Time Calculation (min): 17 min  Charges: OT General Charges $OT Visit: 1 Visit OT Treatments $Therapeutic Exercise: 8-22 mins    Ezra Sites, OTR/L  [phone] 01/17/2020, 8:10 AM

## 2020-01-18 DIAGNOSIS — E871 Hypo-osmolality and hyponatremia: Secondary | ICD-10-CM | POA: Diagnosis not present

## 2020-01-18 LAB — BASIC METABOLIC PANEL
Anion gap: 7 (ref 5–15)
Anion gap: 7 (ref 5–15)
BUN: 7 mg/dL — ABNORMAL LOW (ref 8–23)
BUN: 8 mg/dL (ref 8–23)
CO2: 26 mmol/L (ref 22–32)
CO2: 27 mmol/L (ref 22–32)
Calcium: 8.8 mg/dL — ABNORMAL LOW (ref 8.9–10.3)
Calcium: 8.7 mg/dL — ABNORMAL LOW (ref 8.9–10.3)
Chloride: 94 mmol/L — ABNORMAL LOW (ref 98–111)
Chloride: 93 mmol/L — ABNORMAL LOW (ref 98–111)
Creatinine, Ser: 0.52 mg/dL — ABNORMAL LOW (ref 0.61–1.24)
Creatinine, Ser: 0.71 mg/dL (ref 0.61–1.24)
GFR, Estimated: >60 mL/min (ref >60)
GFR, Estimated: >60 mL/min (ref >60)
Glucose, Bld: 111 mg/dL — ABNORMAL HIGH (ref 70–99)
Glucose, Bld: 119 mg/dL — ABNORMAL HIGH (ref 70–99)
Potassium: 3.9 mmol/L (ref 3.5–5.1)
Potassium: 4.3 mmol/L (ref 3.5–5.1)
Sodium: 127 mmol/L — ABNORMAL LOW (ref 135–145)
Sodium: 127 mmol/L — ABNORMAL LOW (ref 135–145)

## 2020-01-18 LAB — VITAMIN B1: Vitamin B1 (Thiamine): 125.2 nmol/L (ref 66.5–200.0)

## 2020-01-18 LAB — MAGNESIUM: Magnesium: 1.9 mg/dL (ref 1.7–2.4)

## 2020-01-18 MED ORDER — PANTOPRAZOLE SODIUM 40 MG PO TBEC: 40.0000 mg | DELAYED_RELEASE_TABLET | Freq: Two times a day (BID) | ORAL | 0 refills | Status: DC

## 2020-01-18 MED ORDER — SODIUM CHLORIDE 0.9 % IV SOLN
INTRAVENOUS | Status: DC
  Administered 2020-01-21: 75 mL/h via INTRAVENOUS

## 2020-01-18 MED ORDER — ADULT MULTIVITAMIN W/MINERALS CH: 1.0000 | ORAL_TABLET | Freq: Every day | ORAL | 0 refills | Status: AC

## 2020-01-18 MED ORDER — FOLIC ACID 1 MG PO TABS: 1.0000 mg | ORAL_TABLET | Freq: Every day | ORAL | 0 refills | Status: AC

## 2020-01-18 MED ORDER — THIAMINE HCL 100 MG PO TABS: 100.0000 mg | ORAL_TABLET | Freq: Every day | ORAL | 0 refills | Status: AC

## 2020-01-18 NOTE — Progress Notes (Signed)
Physical Therapy Treatment Patient Details Name: Barry Fowler MRN: [id number] DOB: May 29, 1955 Today's Date: 01/18/2020    History of Present Illness Barry Fowler  is a 64 y.o. male, with past medical history of hypertension, hyperlipidemia, tobacco abuse, 1 pack/day for last 30 years, sister at bedside assist with the history, patient was started last month on losartan for high blood pressure, patient with heavy alcohol abuse, up to 12 beers per day, patient presents to ED secondary couple weeks of generalized weakness, as well for last few days he had diminished appetite, and there was some questionable left-sided facial droop, as well as some nausea, and one episode of dark bowel movement, which prompted family to call EMS, patient lives with his nephew, patient denies any chest pain, denies any shortness of breath, headache, abdominal pain, vomiting, coffee-ground emesis.    PT Comments    Patient does not require assist for bed mobility today and demonstrates good sitting tolerance and sitting balance while seated EOB. He transfers to standing with RW and is unsteady upon standing. Patient ambulates with RW with unsteady, labored cadence with trunk flexed. Patient able to walk increased distance today before fatigue but continues to remain unsteady and at an increased risk for falls. Patient ended session seated in chair with nursing present. Patient will benefit from continued physical therapy in hospital and recommended venue below to increase strength, balance, endurance for safe ADLs and gait.     Follow Up Recommendations  SNF;Supervision for mobility/OOB;Supervision/Assistance - 24 hour     Equipment Recommendations  Rolling walker with 5" wheels    Recommendations for Other Services       Precautions / Restrictions Precautions Precautions: Fall Restrictions Weight Bearing Restrictions: No    Mobility  Bed Mobility Overal bed mobility: Modified Independent              General bed mobility comments: to transition to seated EOB  Transfers Overall transfer level: Needs assistance Equipment used: Rolling walker (2 wheeled) Transfers: Sit to/from [employer] Sit to Stand: Min assist Stand pivot transfers: Min assist       General transfer comment: min assist to transition to seated, unsteady upon standing  Ambulation/Gait Ambulation/Gait assistance: Min assist;Mod assist Gait Distance (Feet): 95 Feet Assistive device: Rolling walker (2 wheeled) Gait Pattern/deviations: Decreased step length - right;Decreased step length - left;Trunk flexed Gait velocity: decreased   General Gait Details: slow labored unsteady cadence with trunk flexed, unsteady with turning, intermittent leg trembling, appears to be increased risk for falling   Stairs             Wheelchair Mobility    Modified Rankin (Stroke Patients Only)       Balance Overall balance assessment: Needs assistance Sitting-balance support: Feet supported;No upper extremity supported Sitting balance-Leahy Scale: Fair Sitting balance - Comments: sitting EOB   Standing balance support: Bilateral upper extremity supported;During functional activity Standing balance-Leahy Scale: Fair Standing balance comment: fair/poor using RW                            Cognition Arousal/Alertness: Awake/alert Behavior During Therapy: WFL for tasks assessed/performed Overall Cognitive Status: Within Functional Limits for tasks assessed                                        Exercises      General  Comments        Pertinent Vitals/Pain Pain Assessment: No/denies pain    Home Living                      Prior Function            PT Goals (current goals can now be found in the care plan section) Acute Rehab PT Goals Patient Stated Goal: return home with family to assist PT Goal Formulation: With patient Time For Goal Achievement:  01/28/20 Potential to Achieve Goals: Good Progress towards PT goals: Progressing toward goals    Frequency    Min 3X/week      PT Plan Current plan remains appropriate    Co-evaluation              AM-PAC PT "6 Clicks" Mobility   Outcome Measure  Help needed turning from your back to your side while in a flat bed without using bedrails?: A Little Help needed moving from lying on your back to sitting on the side of a flat bed without using bedrails?: A Little Help needed moving to and from a bed to a chair (including a wheelchair)?: A Lot Help needed standing up from a chair using your arms (e.g., wheelchair or bedside chair)?: A Little Help needed to walk in hospital room?: A Lot Help needed climbing 3-5 steps with a railing? : A Lot 6 Click Score: 15    End of Session   Activity Tolerance: Patient tolerated treatment well;Patient limited by fatigue Patient left: in chair;with call bell/phone within reach;with nursing/sitter in room Nurse Communication: Mobility status PT Visit Diagnosis: Unsteadiness on feet (R26.81);Other abnormalities of gait and mobility (R26.89);Muscle weakness (generalized) (M62.81)     Time: 0175-1025 PT Time Calculation (min) (ACUTE ONLY): 11 min  Charges:  $Therapeutic Activity: 8-22 mins                     9:48 AM, 01/18/20 Wyman Songster PT, DPT Physical Therapist at [facility]

## 2020-01-18 NOTE — Discharge Summary (Addendum)
Physician Discharge Summary  Barry Fowler ZOX:[id number] DOB: [birth year] DOA: 01/11/2020  PCP: Patient, No Pcp Per  Admit date: 01/11/2020  Discharge date: 01/22/2020  Admitted From:Home  Disposition:  SNF  Recommendations for Outpatient Follow-up:  1. Follow up with PCP in 1-2 weeks 2. Please obtain BMP/CBC in one week 3. Avoid alcohol use and continue to eat on a regular basis 4. Continue on urea-sodium supplementation as prescribed by nephrology with 15 g packets twice daily which can be increased to 30 g packets twice daily as needed for persistent sodium levels below 130. 5. On Omnicef as prescribed for 3 more days to complete a total 7-day course of treatment for E. coli UTI  Home Health: None  Equipment/Devices: None  Discharge Condition: Stable  CODE STATUS: Full  Diet recommendation: Heart Healthy  Brief/Interim Summary: [name],with past medical history of hypertension, hyperlipidemia, tobacco abuse, 1 pack/day for last 30 years, sister at bedside assist with the history, patient was started last month on losartan for high blood pressure, patient with heavy alcohol abuse, up to 12 beers per day, patient presents to ED secondary couple weeks of generalized weakness, as well for last few days he had diminished appetite, and there was some questionable left-sided facial droop, as well as some nausea, and one episode of dark bowel movement, which prompted family to call EMS, patient lives with his nephew, patient denies any chest pain, denies any shortness of breath, headache, abdominal pain, vomiting, coffee-ground emesis. - in EDpatient was noted to have sodium of 106, cultures pending, but hemoglobin is stable at 13.2, his potassium is 3.6, CT head with no acute findings, COVID-19 test has been negative, UA with no acute finding, negative urine drug screen, UA and chest x-ray is pending.  -Patient was admitted with severe hyponatremia which have  improved up to the mid 120 range.  He continued to have persistent decrease in sodium levels which would not reach a level of 130 or greater.  He was seen by nephrology with recommendations to continue normal saline and ultimately was placed on urea packets which has helped his sodium levels even off of IV fluid.  He continues to feel well on this day of discharge and has a sodium level of 128.  He is also noted to have E. coli UTI for which she was started on Rocephin for treatment and will continue on Omnicef as prescribed above to complete the course of treatment.  Recommendations noted below.  Hyponatremia-persistent -Likely related to beer drinkers potomania -Appreciate nephrology assistance -Plan to discharge today with urea packets 15 g twice daily as prescribed by nephrology.  He will need repeat BMP in 1 week and if sodium levels continue to remain below 130, could have increase in dosing to 30 g twice daily.  May follow-up with nephrology in the near future as needed.  E. coli UTI -Patient has completed 4-day course of treatment with IV Rocephin, plan to continue for 3 more days on Omnicef as prescribed  Hypokalemia-resolved -Recheck BMP in 1 week  Acute metabolic encephalopathy-resolved -CT head negative -Likely secondary to above  Alcohol abuse -No signs of withdrawal at this time -Encourage cessation of alcohol use moving forward  Hypertension-stable -Losartan currently on hold, but will resume on discharge -Blood pressures are stable  Hyperlipidemia -Plan to resume statin on discharge  GI bleeding-appears resolved -Patient noted to have dark-colored stools that were heme positive -He was taking Mobic prior to admission, avoid on dc -Continue on ProtonixBID -  Refuses EGD, follow-up outpatient  Elevated LFTs-resolved -Abdominal ultrasound negative -Although suspect this is related to alcohol -Viral hepatitis panel negative   Discharge Diagnoses:  Active  Problems:   Hyponatremia   Alcohol abuse   Abnormal LFTs (liver function tests)   Melena    Discharge Instructions  Discharge Instructions    Diet - low sodium heart healthy   Complete by: As directed    Increase activity slowly   Complete by: As directed      Allergies as of 01/22/2020   No Known Allergies     Medication List    STOP taking these medications   hydrOXYzine 25 MG tablet Commonly known as: ATARAX/VISTARIL   meloxicam 15 MG tablet Commonly known as: MOBIC   permethrin 5 % cream Commonly known as: ELIMITE   predniSONE 10 MG tablet Commonly known as: DELTASONE     TAKE these medications   acetaminophen 500 MG tablet Commonly known as: TYLENOL Take 500 mg by mouth every 6 (six) hours as needed.   cefdinir 300 MG capsule Commonly known as: OMNICEF Take 1 capsule (300 mg total) by mouth 2 (two) times daily for 3 days.   folic acid 1 MG tablet Commonly known as: FOLVITE Take 1 tablet (1 mg total) by mouth daily.   losartan 25 MG tablet Commonly known as: COZAAR Take 25 mg by mouth daily.   multivitamin with minerals Tabs tablet Take 1 tablet by mouth daily.   pantoprazole 40 MG tablet Commonly known as: Protonix Take 1 tablet (40 mg total) by mouth 2 (two) times daily.   thiamine 100 MG tablet Take 1 tablet (100 mg total) by mouth daily.   Urea 15 g Pack Take 15 g by mouth 2 (two) times daily.       Contact information for follow-up providers    pcp Follow up in 1 week(s).            Contact information for after-discharge care    Destination    [facility] .   Service: Skilled Nursing Contact information: [address] [place] [address] [phone]                 No Known Allergies  Consultations:  GI  Nephrology   Procedures/Studies: CT Head Wo Contrast  Result Date: 01/11/2020 CLINICAL DATA:  Weakness.  Left facial droop. EXAM: CT HEAD WITHOUT  CONTRAST TECHNIQUE: Contiguous axial images were obtained from the base of the skull through the vertex without intravenous contrast. COMPARISON:  MRI brain dated January 19, 2019. CT head dated March 21, 2018. FINDINGS: Brain: No evidence of acute infarction, hemorrhage, hydrocephalus, extra-axial collection or mass lesion/mass effect. Stable mild atrophy. Vascular: Calcified atherosclerosis at the skullbase. No hyperdense vessel. Skull: Normal. Negative for fracture or focal lesion. Sinuses/Orbits: No acute finding. Both ocular lenses are posteriorly displaced, more so on the left. Other: None. IMPRESSION: 1. No acute intracranial abnormality. 2. 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 Electronically Signed   By: Obie Dredge M.D.   On: 01/11/2020 12:49   US Abdomen Complete  Result Date: 01/13/2020 CLINICAL DATA:  Abnormal liver function tests. EXAM: ABDOMEN ULTRASOUND COMPLETE COMPARISON:  None. FINDINGS: Gallbladder: No gallstones or wall thickening visualized. No sonographic Murphy sign noted by sonographer. Common bile duct: Diameter: 3 mm Liver: Moderately increased parenchymal echogenicity diffusely without a focal lesion identified. Portal vein is patent on color Doppler imaging with normal direction of  blood flow towards the liver. IVC: No abnormality visualized. Pancreas: Visualized portion unremarkable. Spleen: Size and appearance within normal limits. Right Kidney: Length: 12.6 cm. Echogenicity within normal limits. No mass or hydronephrosis visualized. Left Kidney: Length: 11.5 cm. Echogenicity within normal limits. No mass or hydronephrosis visualized. Abdominal aorta: No aneurysm visualized with the aortic bifurcation being obscured. Other findings: None. IMPRESSION: 1. Echogenic liver, nonspecific though often seen with steatosis. 2. Otherwise unremarkable abdominal ultrasound. Electronically Signed   By: Sebastian Ache M.D.   On: 01/13/2020 14:20   DG Chest  Port 1 View  Result Date: 01/11/2020 CLINICAL DATA:  64 year old male with weakness for 3 months. Left side facial droop. EXAM: PORTABLE CHEST 1 VIEW COMPARISON:  Chest CT 07/03/2019. FINDINGS: Portable AP semi upright view at 1614 hours. Large lung volumes. Mediastinal contours are within normal limits. Visualized tracheal air column is within normal limits. Allowing for portable technique the lungs are clear. No pneumothorax. No acute osseous abnormality identified. Chronic right lower rib fractures. IMPRESSION: Pulmonary hyperinflation.  No acute cardiopulmonary abnormality. Electronically Signed   By: Odessa Fleming M.D.   On: 01/11/2020 16:23    Discharge Exam: Vitals:   01/21/20 2050 01/22/20 0501  BP: 130/86 (!) 155/92  Pulse: 83 78  Resp: 16 20  Temp: 98.7 F (37.1 C) 98.8 F (37.1 C)  SpO2: 97% 98%   Vitals:   01/21/20 0556 01/21/20 1600 01/21/20 2050 01/22/20 0501  BP: (!) 152/92 (!) 156/98 130/86 (!) 155/92  Pulse: 74 77 83 78  Resp: Temp: 98.3 F (36.8 C) 98 F (36.7 C) 98.7 F (37.1 C) 98.8 F (37.1 C)  TempSrc: Oral Oral Oral Oral  SpO2: 92% 98% 97% 98%  Weight:      Height:        General: Pt is alert, awake, not in acute distress Cardiovascular: RRR, S1/S2 +, no rubs, no gallops Respiratory: CTA bilaterally, no wheezing, no rhonchi Abdominal: Soft, NT, ND, bowel sounds + Extremities: no edema, no cyanosis    The results of significant diagnostics from this hospitalization (including imaging, microbiology, ancillary and laboratory) are listed below for reference.     Microbiology: Recent Results (from the past 240 hour(s))  Respiratory Panel by RT PCR (Flu A&B, Covid) - Nasopharyngeal Swab     Status: None   Collection Time: 01/17/20  1:29 PM   Specimen: Nasopharyngeal Swab  Result Value Ref Range Status   SARS Coronavirus 2 by RT PCR NEGATIVE NEGATIVE Final    Comment: (NOTE) SARS-CoV-2 target nucleic acids are NOT DETECTED.  The SARS-CoV-2  RNA is generally detectable in upper respiratoy specimens during the acute phase of infection. The lowest concentration of SARS-CoV-2 viral copies this assay can detect is 131 copies/mL. A negative result does not preclude SARS-Cov-2 infection and should not be used as the sole basis for treatment or other patient management decisions. A negative result may occur with  improper specimen collection/handling, submission of specimen other than nasopharyngeal swab, presence of viral mutation(s) within the areas targeted by this assay, and inadequate number of viral copies (<131 copies/mL). A negative result must be combined with clinical observations, patient history, and epidemiological information. The expected result is Negative.  Fact Sheet for Patients:  https://www.moore.com/  Fact Sheet for Healthcare Providers:  https://www.young.biz/  This test is no t yet approved or cleared by the [place] FDA and  has been authorized for detection and/or diagnosis of SARS-CoV-2 by FDA under an Emergency  Use Authorization (EUA). This EUA will remain  in effect (meaning this test can be used) for the duration of the COVID-19 declaration under Section 564(b)(1) of the Act, 21 U.S.C. section 360bbb-3(b)(1), unless the authorization is terminated or revoked sooner.     Influenza A by PCR NEGATIVE NEGATIVE Final   Influenza B by PCR NEGATIVE NEGATIVE Final    Comment: (NOTE) The Xpert Xpress SARS-CoV-2/FLU/RSV assay is intended as an aid in  the diagnosis of influenza from Nasopharyngeal swab specimens and  should not be used as a sole basis for treatment. Nasal washings and  aspirates are unacceptable for Xpert Xpress SARS-CoV-2/FLU/RSV  testing.  Fact Sheet for Patients: https://www.moore.com/  Fact Sheet for Healthcare Providers: https://www.young.biz/  This test is not yet approved or cleared by the  [place] FDA and  has been authorized for detection and/or diagnosis of SARS-CoV-2 by  FDA under an Emergency Use Authorization (EUA). This EUA will remain  in effect (meaning this test can be used) for the duration of the  Covid-19 declaration under Section 564(b)(1) of the Act, 21  U.S.C. section 360bbb-3(b)(1), unless the authorization is  terminated or revoked. Performed at [facility], [address] [place], [place] [address]   Culture, Urine     Status: Abnormal   Collection Time: 01/19/20 11:03 AM   Specimen: Urine, Clean Catch  Result Value Ref Range Status   Specimen Description   Final    URINE, CLEAN CATCH Performed at [facility], [address] [place], [place] [address]    Special Requests   Final    NONE Performed at [facility], [address] [place], [place] [address]    Culture >=100,000 COLONIES/mL ESCHERICHIA COLI (A)  Final   Report Status 01/22/2020 FINAL  Final   Organism ID, Bacteria ESCHERICHIA COLI (A)  Final      Susceptibility   Escherichia coli - MIC*    AMPICILLIN <=2 SENSITIVE Sensitive     CEFAZOLIN <=4 SENSITIVE Sensitive     CEFTRIAXONE <=0.25 SENSITIVE Sensitive     CIPROFLOXACIN <=0.25 SENSITIVE Sensitive     GENTAMICIN <=1 SENSITIVE Sensitive     IMIPENEM <=0.25 SENSITIVE Sensitive     NITROFURANTOIN <=16 SENSITIVE Sensitive     TRIMETH/SULFA <=20 SENSITIVE Sensitive     AMPICILLIN/SULBACTAM <=2 SENSITIVE Sensitive     PIP/TAZO <=4 SENSITIVE Sensitive     * >=100,000 COLONIES/mL ESCHERICHIA COLI  Respiratory Panel by RT PCR (Flu A&B, Covid) - Nasopharyngeal Swab     Status: None   Collection Time: 01/21/20 10:51 AM   Specimen: Nasopharyngeal Swab  Result Value Ref Range Status   SARS Coronavirus 2 by RT PCR NEGATIVE NEGATIVE Final    Comment: (NOTE) SARS-CoV-2 target nucleic acids are NOT DETECTED.  The SARS-CoV-2 RNA is generally detectable in upper respiratoy specimens during the acute phase of infection. The  lowest concentration of SARS-CoV-2 viral copies this assay can detect is 131 copies/mL. A negative result does not preclude SARS-Cov-2 infection and should not be used as the sole basis for treatment or other patient management decisions. A negative result may occur with  improper specimen collection/handling, submission of specimen other than nasopharyngeal swab, presence of viral mutation(s) within the areas targeted by this assay, and inadequate number of viral copies (<131 copies/mL). A negative result must be combined with clinical observations, patient history, and epidemiological information. The expected result is Negative.  Fact Sheet for Patients:  https://www.moore.com/  Fact Sheet for Healthcare Providers:  https://www.young.biz/  This test is no t yet approved or cleared by the [place] and  has been authorized for detection and/or diagnosis of SARS-CoV-2 by FDA under an Emergency Use Authorization (EUA). This EUA will remain  in effect (meaning this test can be used) for the duration of the COVID-19 declaration under Section 564(b)(1) of the Act, 21 U.S.C. section 360bbb-3(b)(1), unless the authorization is terminated or revoked sooner.     Influenza A by PCR NEGATIVE NEGATIVE Final   Influenza B by PCR NEGATIVE NEGATIVE Final    Comment: (NOTE) The Xpert Xpress SARS-CoV-2/FLU/RSV assay is intended as an aid in  the diagnosis of influenza from Nasopharyngeal swab specimens and  should not be used as a sole basis for treatment. Nasal washings and  aspirates are unacceptable for Xpert Xpress SARS-CoV-2/FLU/RSV  testing.  Fact Sheet for Patients: https://www.moore.com/  Fact Sheet for Healthcare Providers: https://www.young.biz/  This test is not yet approved or cleared by the [place] FDA and  has been authorized for detection and/or diagnosis of SARS-CoV-2 by  FDA under  an Emergency Use Authorization (EUA). This EUA will remain  in effect (meaning this test can be used) for the duration of the  Covid-19 declaration under Section 564(b)(1) of the Act, 21  U.S.C. section 360bbb-3(b)(1), unless the authorization is  terminated or revoked. Performed at [facility], [address], [place], [place] [address]      Labs: BNP (last 3 results) No results for input(s): BNP in the last 8760 hours. Basic Metabolic Panel: Recent Labs  Lab 01/16/20 0330 01/16/20 0330 01/17/20 0444 01/17/20 0444 01/18/20 0400 01/18/20 1129 01/19/20 0617 01/19/20 1147 01/20/20 0616 01/21/20 0504 01/22/20 0416  NA 125*   < > 129*   < > 127*   < > 123* 127* 126* 129* 128*  K 3.0*   < > 3.2*   < > 3.9   < > 3.7 4.3 3.8 3.9 4.1  CL 89*   < > 93*   < > 94*   < > 91* 89* 93* 95* 95*  CO2 26   < > 27   < > 26   < > 23 24 25 25 25   GLUCOSE 119*   < > 112*   < > 111*   < > 111* 112* 112* 106* 114*  BUN 7*   < > 6*   < > 7*   < > 8 9 18  25* 25*  CREATININE 0.59*   < > 0.56*   < > 0.52*   < > 0.58* 0.77 0.56* 0.59* 0.61  CALCIUM 8.3*   < > 8.7*   < > 8.8*   < > 8.5* 9.6 8.7* 8.7* 8.8*  MG  --   --  1.7  --  1.9  --  1.7  --   --   --   --   PHOS 3.3  --   --   --   --   --   --   --   --   --   --    < > = values in this interval not displayed.   Liver Function Tests: Recent Labs  Lab 01/16/20 0330  ALBUMIN 2.7*   No results for input(s): LIPASE, AMYLASE in the last 168 hours. No results for input(s): AMMONIA in the last 168 hours. CBC: Recent Labs  Lab 01/16/20 1402 01/17/20 0444 01/22/20 0416  WBC 8.6 8.1 7.9  HGB 12.7* 12.4* 12.5*  HCT 36.7* 35.2*  37.2*  MCV 92.2 91.0 94.4  PLT 251 258 364   Cardiac Enzymes: No results for input(s): CKTOTAL, CKMB, CKMBINDEX, TROPONINI in the last 168 hours. BNP: Invalid input(s): POCBNP CBG: No results for input(s): GLUCAP in the last 168 hours. D-Dimer No results for input(s): DDIMER in the last 72 hours. Hgb A1c No  results for input(s): HGBA1C in the last 72 hours. Lipid Profile No results for input(s): CHOL, HDL, LDLCALC, TRIG, CHOLHDL, LDLDIRECT in the last 72 hours. Thyroid function studies No results for input(s): TSH, T4TOTAL, T3FREE, THYROIDAB in the last 72 hours.  Invalid input(s): FREET3 Anemia work up No results for input(s): VITAMINB12, FOLATE, FERRITIN, TIBC, IRON, RETICCTPCT in the last 72 hours. Urinalysis    Component Value Date/Time   COLORURINE AMBER (A) 01/19/2020 1103   APPEARANCEUR HAZY (A) 01/19/2020 1103   APPEARANCEUR Clear 01/10/2012 1248   LABSPEC 1.019 01/19/2020 1103   LABSPEC 1.008 01/10/2012 1248   PHURINE 6.0 01/19/2020 1103   GLUCOSEU NEGATIVE 01/19/2020 1103   GLUCOSEU Negative 01/10/2012 1248   HGBUR LARGE (A) 01/19/2020 1103   BILIRUBINUR NEGATIVE 01/19/2020 1103   BILIRUBINUR Negative 01/10/2012 1248   KETONESUR NEGATIVE 01/19/2020 1103   PROTEINUR 100 (A) 01/19/2020 1103   NITRITE POSITIVE (A) 01/19/2020 1103   LEUKOCYTESUR LARGE (A) 01/19/2020 1103   LEUKOCYTESUR Negative 01/10/2012 1248   Sepsis Labs Invalid input(s): PROCALCITONIN,  WBC,  LACTICIDVEN Microbiology Recent Results (from the past 240 hour(s))  Respiratory Panel by RT PCR (Flu A&B, Covid) - Nasopharyngeal Swab     Status: None   Collection Time: 01/17/20  1:29 PM   Specimen: Nasopharyngeal Swab  Result Value Ref Range Status   SARS Coronavirus 2 by RT PCR NEGATIVE NEGATIVE Final    Comment: (NOTE) SARS-CoV-2 target nucleic acids are NOT DETECTED.  The SARS-CoV-2 RNA is generally detectable in upper respiratoy specimens during the acute phase of infection. The lowest concentration of SARS-CoV-2 viral copies this assay can detect is 131 copies/mL. A negative result does not preclude SARS-Cov-2 infection and should not be used as the sole basis for treatment or other patient management decisions. A negative result may occur with  improper specimen collection/handling, submission of  specimen other than nasopharyngeal swab, presence of viral mutation(s) within the areas targeted by this assay, and inadequate number of viral copies (<131 copies/mL). A negative result must be combined with clinical observations, patient history, and epidemiological information. The expected result is Negative.  Fact Sheet for Patients:  https://www.moore.com/  Fact Sheet for Healthcare Providers:  https://www.young.biz/  This test is no t yet approved or cleared by the [place] FDA and  has been authorized for detection and/or diagnosis of SARS-CoV-2 by FDA under an Emergency Use Authorization (EUA). This EUA will remain  in effect (meaning this test can be used) for the duration of the COVID-19 declaration under Section 564(b)(1) of the Act, 21 U.S.C. section 360bbb-3(b)(1), unless the authorization is terminated or revoked sooner.     Influenza A by PCR NEGATIVE NEGATIVE Final   Influenza B by PCR NEGATIVE NEGATIVE Final    Comment: (NOTE) The Xpert Xpress SARS-CoV-2/FLU/RSV assay is intended as an aid in  the diagnosis of influenza from Nasopharyngeal swab specimens and  should not be used as a sole basis for treatment. Nasal washings and  aspirates are unacceptable for Xpert Xpress SARS-CoV-2/FLU/RSV  testing.  Fact Sheet for Patients: https://www.moore.com/  Fact Sheet for Healthcare Providers: https://www.young.biz/  This test is not yet approved or cleared  by the [place] FDA and  has been authorized for detection and/or diagnosis of SARS-CoV-2 by  FDA under an Emergency Use Authorization (EUA). This EUA will remain  in effect (meaning this test can be used) for the duration of the  Covid-19 declaration under Section 564(b)(1) of the Act, 21  U.S.C. section 360bbb-3(b)(1), unless the authorization is  terminated or revoked. Performed at [facility], [address]  [place], [place] [address]   Culture, Urine     Status: Abnormal   Collection Time: 01/19/20 11:03 AM   Specimen: Urine, Clean Catch  Result Value Ref Range Status   Specimen Description   Final    URINE, CLEAN CATCH Performed at [facility], [address] [place], [place] [address]    Special Requests   Final    NONE Performed at [facility], [address] [place], [place] [address]    Culture >=100,000 COLONIES/mL ESCHERICHIA COLI (A)  Final   Report Status 01/22/2020 FINAL  Final   Organism ID, Bacteria ESCHERICHIA COLI (A)  Final      Susceptibility   Escherichia coli - MIC*    AMPICILLIN <=2 SENSITIVE Sensitive     CEFAZOLIN <=4 SENSITIVE Sensitive     CEFTRIAXONE <=0.25 SENSITIVE Sensitive     CIPROFLOXACIN <=0.25 SENSITIVE Sensitive     GENTAMICIN <=1 SENSITIVE Sensitive     IMIPENEM <=0.25 SENSITIVE Sensitive     NITROFURANTOIN <=16 SENSITIVE Sensitive     TRIMETH/SULFA <=20 SENSITIVE Sensitive     AMPICILLIN/SULBACTAM <=2 SENSITIVE Sensitive     PIP/TAZO <=4 SENSITIVE Sensitive     * >=100,000 COLONIES/mL ESCHERICHIA COLI  Respiratory Panel by RT PCR (Flu A&B, Covid) - Nasopharyngeal Swab     Status: None   Collection Time: 01/21/20 10:51 AM   Specimen: Nasopharyngeal Swab  Result Value Ref Range Status   SARS Coronavirus 2 by RT PCR NEGATIVE NEGATIVE Final    Comment: (NOTE) SARS-CoV-2 target nucleic acids are NOT DETECTED.  The SARS-CoV-2 RNA is generally detectable in upper respiratoy specimens during the acute phase of infection. The lowest concentration of SARS-CoV-2 viral copies this assay can detect is 131 copies/mL. A negative result does not preclude SARS-Cov-2 infection and should not be used as the sole basis for treatment or other patient management decisions. A negative result may occur with  improper specimen collection/handling, submission of specimen other than nasopharyngeal swab, presence of viral mutation(s) within the areas targeted by this  assay, and inadequate number of viral copies (<131 copies/mL). A negative result must be combined with clinical observations, patient history, and epidemiological information. The expected result is Negative.  Fact Sheet for Patients:  https://www.moore.com/https://www.fda.gov/media/142436/download  Fact Sheet for Healthcare Providers:  https://www.young.biz/https://www.fda.gov/media/142435/download  This test is no t yet approved or cleared by the [place] FDA and  has been authorized for detection and/or diagnosis of SARS-CoV-2 by FDA under an Emergency Use Authorization (EUA). This EUA will remain  in effect (meaning this test can be used) for the duration of the COVID-19 declaration under Section 564(b)(1) of the Act, 21 U.S.C. section 360bbb-3(b)(1), unless the authorization is terminated or revoked sooner.     Influenza A by PCR NEGATIVE NEGATIVE Final   Influenza B by PCR NEGATIVE NEGATIVE Final    Comment: (NOTE) The Xpert Xpress SARS-CoV-2/FLU/RSV assay is intended as an aid in  the diagnosis of influenza from Nasopharyngeal swab specimens and  should not be used as a sole basis for treatment. Nasal washings and  aspirates are unacceptable for Xpert Xpress SARS-CoV-2/FLU/RSV  testing.  Fact Sheet for Patients: https://www.moore.com/  Fact Sheet for Healthcare Providers: https://www.young.biz/  This test is not yet approved or cleared by the [place] FDA and  has been authorized for detection and/or diagnosis of SARS-CoV-2 by  FDA under an Emergency Use Authorization (EUA). This EUA will remain  in effect (meaning this test can be used) for the duration of the  Covid-19 declaration under Section 564(b)(1) of the Act, 21  U.S.C. section 360bbb-3(b)(1), unless the authorization is  terminated or revoked. Performed at [facility], [address] [place], [place] [address]      Time coordinating discharge: 35 minutes  SIGNED:   Erick Blinks, DO [facility] 01/22/2020, 11:46 AM  If 7PM-7AM, please contact night-coverage [email or web address]

## 2020-01-18 NOTE — TOC Progression Note (Signed)
Transition of Care [facility]) - Progression Note   Patient Details  Name: Barry Fowler MRN: [id number] Date of Birth: 08/09/1955  Transition of Care [facility]) CM/SW Contact  Ewing Schlein, LCSW Phone Number: 01/18/2020, 2:19 PM  Clinical Narrative: CSW spoke with Gavin Pound in admissions at [facility]. Per Gavin Pound, patient can be admitted on Sunday (01/20/20) provided [facility] receives the discharge summary today so that medications can be ordered as the pharmacy used is out of town. Discharge summary faxed to [facility] CSW called Fransico Him to cancel previous insurance authorization. CSW started new insurance authorization and uploaded clinicals to [facility]. New reference ID is: [id number]. TOC to follow.  Expected Discharge Plan: Skilled Nursing Facility Barriers to Discharge: Continued Medical Work up  Expected Discharge Plan and Services Expected Discharge Plan: Skilled Nursing Facility Living arrangements for the past 2 months: Single Family Home  Readmission Risk Interventions No flowsheet data found.

## 2020-01-18 NOTE — Progress Notes (Signed)
[facility] Progress Note    Assessment/ Plan:   1. Hyponatremia, severe- Uosm 211, Sosm 225, Urine Na 15 consistent with poor solute intake and beer potomania.  Overall improved.  Na 129 10/21 but down to 127 10/22 off IVFs.  Discussed with primary- will check BMP at noon and if same/ lower will place back on IVFs.  Goal serum Na would be 130 or greater maintained off IVFs. 2. Acute metabolic encephalopathy- CT scan of head negative. Slowly improving.  3. Alcohol abuse- continue with CIWA protocol per primary 4. HTN- actually had hypotension and meds on hold. BP stable for now. 5. Heme + stools per primary. Hgb stable.  6. Hypokalemia: replete prn 7. Dispo: pending maintenance of serum sodium of 130 or higher off IVFs.  PT recommending SNF which he is agreeable to.  Subjective:    Sitting in bed this AM, requesting assistance with breakfast tray.  Provided.  Na down to 127 off fluids.  Pt recommending 24 hr supervision.   Objective:   BP (!) 141/89 (BP Location: Left Arm)   Pulse 68   Temp 98.2 F (36.8 C) (Oral)   Resp 16   Ht 6' (1.829 m)   Wt 75 kg   SpO2 94%   BMI 22.42 kg/m   Intake/Output Summary (Last 24 hours) at 01/18/2020 0936 Last data filed at 01/18/2020 2263 Gross per 24 hour  Intake 240 ml  Output 1800 ml  Net -1560 ml   Weight change:   Physical Exam: Gen: NAD, sitting up in chair CVS: RRR Resp: clear Abd: no tenderness Ext: no LE edema NEURO: AAO x 3, appears to have some delayed processing  Imaging: No results found.  Labs: BMET Recent Labs  Lab 01/11/20 1533 01/11/20 1633 01/13/20 0601 01/13/20 0944 01/14/20 0515 01/14/20 1059 01/14/20 1742 01/14/20 2203 01/15/20 0212 01/15/20 0708 01/16/20 0330 01/17/20 0444 01/18/20 0400  NA 109*   < > 116*   < > 120*   < > 121* 124* 122* 125* 125* 129* 127*  K 3.5  --  3.3*  --  3.5  --   --   --  3.2*  --  3.0* 3.2* 3.9  CL 76*  --  82*  --  87*  --   --   --  90*  --  89*  93* 94*  CO2 21*  --  24  --  23  --   --   --  25  --  26 27 26   GLUCOSE 99  --  83  --  99  --   --   --  111*  --  119* 112* 111*  BUN 7*  --  5*  --  <5*  --   --   --  <5*  --  7* 6* 7*  CREATININE 0.54*  --  0.58*  --  0.57*  --   --   --  0.54*  --  0.59* 0.56* 0.52*  CALCIUM 8.1*  --  8.1*  --  8.2*  --   --   --  7.8*  --  8.3* 8.7* 8.8*  PHOS  --   --   --   --   --   --   --   --  3.2  --  3.3  --   --    < > = values in this interval not displayed.   CBC Recent Labs  Lab 01/11/20 1211 01/12/20 0609 01/13/20  0601 01/15/20 0212 01/16/20 1402 01/17/20 0444  WBC 13.8*   < > 8.7 7.8 8.6 8.1  NEUTROABS 11.9*  --   --   --   --   --   HGB 14.2   < > 13.4 12.7* 12.7* 12.4*  HCT RESULTS UNAVAILABLE DUE TO INTERFERING SUBSTANCE   < > 36.6* 36.3* 36.7* 35.2*  MCV RESULTS UNAVAILABLE DUE TO INTERFERING SUBSTANCE   < > 87.8 90.8 92.2 91.0  PLT 220   < > 193 219 251 258   < > = values in this interval not displayed.    Medications:    . Chlorhexidine Gluconate Cloth  6 each Topical Daily  . folic acid  1 mg Oral Daily  . heparin  5,000 Units Subcutaneous Q8H  . multivitamin with minerals  1 tablet Oral Daily  . nicotine  21 mg Transdermal Daily  . pantoprazole  40 mg Intravenous Q12H  . thiamine  100 mg Oral Daily   Or  . thiamine  100 mg Intravenous Daily      Bufford Buttner, MD 01/18/2020, 9:36 AM

## 2020-01-18 NOTE — Care Management Important Message (Signed)
Important Message  Patient Details  Name: Barry Fowler MRN: [id number] Date of Birth: Jun 19, 1955   Medicare Important Message Given:  Yes (RN, Judeth Cornfield was asked to deliver form.)     Corey Harold 01/18/2020, 4:01 PM

## 2020-01-18 NOTE — Progress Notes (Signed)
Nutrition Follow-up  DOCUMENTATION CODES:   Not applicable  INTERVENTION:  Ensure Enlive po BID, each supplement provides 350 kcal and 20 grams of protein  Encouraged po intake of meals and supplements  Unable to provide Ensure Enlive at this time, orders have been reconciled for discharge  NUTRITION DIAGNOSIS:   Inadequate oral intake related to decreased appetite, nausea as evidenced by per patient/family report (noted per H&P).    GOAL:   Patient will meet greater than or equal to 90% of their needs    MONITOR:   Diet advancement, Labs, I & O's, Weight trends  REASON FOR ASSESSMENT:   Malnutrition Screening Tool    ASSESSMENT:   64 year old male with history of HTN, HLD, tobacco abuse and alcohol abuse presented with generalized weakness over the past couple of weeks, diminished appetite with nausea, episode of dark stool, and questionable left-sided facial droop. Pt admitted with hyopnatremia.  Pt sitting on banquet eating lunch this afternoon, reports enjoying meal and endorses good appetite. Per flowsheets, he consumed 75-90% x 2 documented meals on 10/17. RD discussed the importance of adequate nutrition, he is agreeable to drinking Ensure supplement to aid with meeting needs. He reports poor po at home prior to admission, recalls mostly sandwiches and drinking around 6 beers/day. Pt states he is done with drinking alcohol, RD supportive of pt statement and provided encouragement.   GI bleeding appears resolved, however pt with persistent low sodium levels, 127 this am and confirmed on repeat this afternoon. Continue IV normal saline infusion per nephrology, pt will be eligible for discharge once sodium stable at 130.   Current wt 75 kg Admit wt 75.1 kg  Medications reviewed and include: Folic acid, MVI with minerals, Protonix, Thiamine IVF: NaCl @ 75 ml/hr  Labs: Na 127 (L) K/Mg/P- WNL  Diet Order:   Diet Order            DIET SOFT Room service  appropriate? Yes; Fluid consistency: Thin  Diet effective now                 EDUCATION NEEDS:   Not appropriate for education at this time  Skin:  Skin Assessment: Skin Integrity Issues: Skin Integrity Issues:: Other (Comment) Other: scattered abrasions  Last BM:  10/15  Height:   Ht Readings from Last 1 Encounters:  01/11/20 6' (1.829 m)    Weight:   Wt Readings from Last 1 Encounters:  01/16/20 75 kg     BMI:  Body mass index is 22.42 kg/m.  Estimated Nutritional Needs:   Kcal:  2025-2250  Protein:  100-110  Fluid:  >/= 2 L/day   Lars Masson, RD, LDN Clinical Nutrition After Hours/Weekend Pager # in Amion

## 2020-01-19 DIAGNOSIS — E871 Hypo-osmolality and hyponatremia: Secondary | ICD-10-CM | POA: Diagnosis not present

## 2020-01-19 LAB — URINALYSIS, ROUTINE W REFLEX MICROSCOPIC
Bilirubin Urine: NEGATIVE
Glucose, UA: NEGATIVE mg/dL
Ketones, ur: NEGATIVE mg/dL
Nitrite: POSITIVE — AB
Protein, ur: 100 mg/dL — AB
RBC / HPF: >50 RBC/hpf — ABNORMAL HIGH (ref 0–5)
Specific Gravity, Urine: 1.019 (ref 1.005–1.030)
WBC, UA: >50 WBC/hpf — ABNORMAL HIGH (ref 0–5)
pH: 6 (ref 5.0–8.0)

## 2020-01-19 LAB — BASIC METABOLIC PANEL
Anion gap: 9 (ref 5–15)
Anion gap: 14 (ref 5–15)
BUN: 8 mg/dL (ref 8–23)
BUN: 9 mg/dL (ref 8–23)
CO2: 23 mmol/L (ref 22–32)
CO2: 24 mmol/L (ref 22–32)
Calcium: 8.5 mg/dL — ABNORMAL LOW (ref 8.9–10.3)
Calcium: 9.6 mg/dL (ref 8.9–10.3)
Chloride: 91 mmol/L — ABNORMAL LOW (ref 98–111)
Chloride: 89 mmol/L — ABNORMAL LOW (ref 98–111)
Creatinine, Ser: 0.58 mg/dL — ABNORMAL LOW (ref 0.61–1.24)
Creatinine, Ser: 0.77 mg/dL (ref 0.61–1.24)
GFR, Estimated: >60 mL/min (ref >60)
GFR, Estimated: >60 mL/min (ref >60)
Glucose, Bld: 111 mg/dL — ABNORMAL HIGH (ref 70–99)
Glucose, Bld: 112 mg/dL — ABNORMAL HIGH (ref 70–99)
Potassium: 3.7 mmol/L (ref 3.5–5.1)
Potassium: 4.3 mmol/L (ref 3.5–5.1)
Sodium: 123 mmol/L — ABNORMAL LOW (ref 135–145)
Sodium: 127 mmol/L — ABNORMAL LOW (ref 135–145)

## 2020-01-19 LAB — MAGNESIUM: Magnesium: 1.7 mg/dL (ref 1.7–2.4)

## 2020-01-19 MED ORDER — SODIUM CHLORIDE 0.9 % IV SOLN
1.0000 g | INTRAVENOUS | Status: DC
  Administered 2020-01-19 – 2020-01-21 (×3): 1 g via INTRAVENOUS
  Filled 2020-01-19 (×3): qty 10

## 2020-01-19 MED ORDER — UREA 15 G PO PACK
15.0000 g | PACK | Freq: Two times a day (BID) | ORAL | Status: DC
  Administered 2020-01-19 – 2020-01-22 (×7): 15 g via ORAL
  Filled 2020-01-19 (×9): qty 1

## 2020-01-19 NOTE — Progress Notes (Addendum)
PROGRESS NOTE    Barry Fowler  ASN:[id number] DOB: 1955/06/07 DOA: 01/11/2020 PCP: Patient, No Pcp Per   Brief Narrative:  [name],with past medical history of hypertension, hyperlipidemia, tobacco abuse, 1 pack/day for last 30 years, sister at bedside assist with the history, patient was started last month on losartan for high blood pressure, patient with heavy alcohol abuse, up to 12 beers per day, patient presents to ED secondary couple weeks of generalized weakness, as well for last few days he had diminished appetite, and there was some questionable left-sided facial droop, as well as some nausea, and one episode of dark bowel movement, which prompted family to call EMS, patient lives with his nephew, patient denies any chest pain, denies any shortness of breath, headache, abdominal pain, vomiting, coffee-ground emesis. - in EDpatient was noted to have sodium of 106, cultures pending, but hemoglobin is stable at 13.2, his potassium is 3.6, CT head with no acute findings, COVID-19 test has been negative, UA with no acute finding, negative urine drug screen, UA and chest x-ray is pending.  10/20:Patient continues to have minimal improvement in sodium levels at 125 this morning. Per nephrology, plan to continue ongoing IV infusion and close monitoring. Advance diet as tolerated with no plans for endoscopy while inpatient. Hemoglobin will be reevaluated in a.m. No overt bleeding noted.  10/21:Sodium levels continue to further improve up to 129 this morning and patient is overall feeling much better. Plan to discontinue IV fluid as discussed with nephrology and ensure stability prior to anticipated discharge in a.m. Bowel movements with no overt bleeding noted and stable hemoglobin levels.  10/22: Patient has remained off normal saline infusion and unfortunately continues to have persistently low sodium levels with sodium at 127 noted this a.m. and confirmed on  repeat this afternoon.  He will need to remain on further IV normal saline infusion as recommended by nephrology.  Encouraged to eat.  10/23: Patient noted to have worsening hyponatremia today and had been resumed on IV normal saline yesterday.  Nephrology to reevaluate and consider treatment with urea to further assist.  Assessment & Plan:   Active Problems:   Hyponatremia   Alcohol abuse   Abnormal LFTs (liver function tests)   Melena  Hyponatremia-persistent -Likely related to beer drinkers potomania -Appreciate nephrology assistance -Unfortunately, he has had a decline in sodium levels this morning to 123 -Continue IV normal saline and follow-up renal panel and consider urea supplementation per nephrology  Dysuria -Check urine analysis as well as urine culture on 10/23  Hypokalemia-resolved -Recheck BMP in a.m.  Acute metabolic encephalopathy-resolved -CT head negative -Likely secondary to above  Alcohol abuse -No signs of withdrawal at this time -Encourage cessation of alcohol use moving forward  Hypertension-stable -Losartan currently on hold, but will resume on discharge -Blood pressures are stable  Hyperlipidemia -Plan to resume statin on discharge  GI bleeding-appears resolved -Patient noted to have dark-colored stools that were heme positive -He was taking Mobic prior to admission, avoid on dc -Continue on ProtonixBID -Refuses EGD, follow-up outpatient  Elevated LFTs-resolved -Abdominal ultrasound negative -Although suspect this is related to alcohol -Viral hepatitis panel negative   DVT prophylaxis:Heparin Code Status: Full Family Communication: Discussed with patient Disposition Plan:  Status is: Inpatient  Remains inpatient appropriate because:IV treatments appropriate due to intensity of illness or inability to take PO and Inpatient level of care appropriate due to severity of illness   Dispo: The patient is from: Home  Anticipated d/c is to: SNF              Anticipated d/c date is: 2 days              Patient currently is not medically stable to d/c.  Patient continues to have ongoing hyponatremia requiring treatment.   Consultants:   Nephrology   GI  Procedures:   None  Antimicrobials:   None   Subjective: Patient seen and evaluated today with no new acute complaints or concerns. No acute concerns or events noted overnight.  He remains frustrated over his sodium levels being low.  He would like to try to go to rehab as soon as possible.  Objective: Vitals:   01/18/20 1328 01/18/20 2053 01/19/20 0559 01/19/20 0823  BP: (!) 141/92 (!) 148/89 (!) 151/90 (!) 146/84  Pulse: 95 77 87 89  Resp: 18 18 18 17   Temp: 98.6 F (37 C) 98.8 F (37.1 C) 98.7 F (37.1 C) 98.4 F (36.9 C)  TempSrc: Oral Oral Oral Oral  SpO2: 95% 94% 93% 97%  Weight:      Height:        Intake/Output Summary (Last 24 hours) at 01/19/2020 1103 Last data filed at 01/19/2020 0829 Gross per 24 hour  Intake 1376.55 ml  Output 900 ml  Net 476.55 ml   Filed Weights   01/11/20 2200 01/14/20 0400 01/16/20 0500  Weight: 75.1 kg 74.9 kg 75 kg    Examination:  General exam: Appears calm and comfortable  Respiratory system: Clear to auscultation. Respiratory effort normal. Cardiovascular system: S1 & S2 heard, RRR.  Gastrointestinal system: Abdomen is nondistended, soft and nontender.  Central nervous system: Alert and oriented. No focal neurological deficits. Extremities: No edema Skin: No rashes, lesions or ulcers Psychiatry: Flat affect    Data Reviewed: I have personally reviewed following labs and imaging studies  CBC: Recent Labs  Lab 01/13/20 0601 01/15/20 0212 01/16/20 1402 01/17/20 0444  WBC 8.7 7.8 8.6 8.1  HGB 13.4 12.7* 12.7* 12.4*  HCT 36.6* 36.3* 36.7* 35.2*  MCV 87.8 90.8 92.2 91.0  PLT 193 219 251 258   Basic Metabolic Panel: Recent Labs  Lab 01/14/20 0515 01/14/20 1059  01/15/20 0212 01/15/20 0708 01/16/20 0330 01/17/20 0444 01/18/20 0400 01/18/20 1129 01/19/20 0617  NA 120*   < > 122*   < > 125* 129* 127* 127* 123*  K 3.5  --  3.2*  --  3.0* 3.2* 3.9 4.3 3.7  CL 87*  --  90*  --  89* 93* 94* 93* 91*  CO2 23  --  25  --  26 27 26 27 23   GLUCOSE 99  --  111*  --  119* 112* 111* 119* 111*  BUN <5*  --  <5*  --  7* 6* 7* 8 8  CREATININE 0.57*  --  0.54*  --  0.59* 0.56* 0.52* 0.71 0.58*  CALCIUM 8.2*  --  7.8*  --  8.3* 8.7* 8.8* 8.7* 8.5*  MG 1.8  --   --   --   --  1.7 1.9  --  1.7  PHOS  --   --  3.2  --  3.3  --   --   --   --    < > = values in this interval not displayed.   GFR: Estimated Creatinine Clearance: 101.6 mL/min (A) (by C-G formula based on SCr of 0.58 mg/dL (L)). Liver Function Tests: Recent Labs  Lab 01/13/20  0601 01/15/20 0212 01/16/20 0330  AST 35 20  --   ALT 21 17  --   ALKPHOS 54 51  --   BILITOT 1.3* 1.0  --   PROT 6.1* 6.0*  --   ALBUMIN 3.0* 2.8* 2.7*   No results for input(s): LIPASE, AMYLASE in the last 168 hours. No results for input(s): AMMONIA in the last 168 hours. Coagulation Profile: No results for input(s): INR, PROTIME in the last 168 hours. Cardiac Enzymes: No results for input(s): CKTOTAL, CKMB, CKMBINDEX, TROPONINI in the last 168 hours. BNP (last 3 results) No results for input(s): PROBNP in the last 8760 hours. HbA1C: No results for input(s): HGBA1C in the last 72 hours. CBG: No results for input(s): GLUCAP in the last 168 hours. Lipid Profile: No results for input(s): CHOL, HDL, LDLCALC, TRIG, CHOLHDL, LDLDIRECT in the last 72 hours. Thyroid Function Tests: No results for input(s): TSH, T4TOTAL, FREET4, T3FREE, THYROIDAB in the last 72 hours. Anemia Panel: No results for input(s): VITAMINB12, FOLATE, FERRITIN, TIBC, IRON, RETICCTPCT in the last 72 hours. Sepsis Labs: No results for input(s): PROCALCITON, LATICACIDVEN in the last 168 hours.  Recent Results (from the past 240 hour(s))   Blood culture (routine x 2)     Status: None   Collection Time: 01/11/20 12:10 PM   Specimen: BLOOD LEFT ARM  Result Value Ref Range Status   Specimen Description BLOOD LEFT ARM  Final   Special Requests   Final    BOTTLES DRAWN AEROBIC AND ANAEROBIC Blood Culture adequate volume   Culture   Final    NO GROWTH 5 DAYS Performed at [facility], [address] [place], [place] [address]    Report Status 01/16/2020 FINAL  Final  Blood culture (routine x 2)     Status: None   Collection Time: 01/11/20 12:11 PM   Specimen: BLOOD RIGHT FOREARM  Result Value Ref Range Status   Specimen Description BLOOD RIGHT FOREARM  Final   Special Requests Blood Culture adequate volume  Final   Culture   Final    NO GROWTH 5 DAYS Performed at [facility], [address] [place], [place] [address]    Report Status 01/16/2020 FINAL  Final  Respiratory Panel by RT PCR (Flu A&B, Covid) - Nasopharyngeal Swab     Status: None   Collection Time: 01/11/20 12:25 PM   Specimen: Nasopharyngeal Swab  Result Value Ref Range Status   SARS Coronavirus 2 by RT PCR NEGATIVE NEGATIVE Final    Comment: (NOTE) SARS-CoV-2 target nucleic acids are NOT DETECTED.  The SARS-CoV-2 RNA is generally detectable in upper respiratoy specimens during the acute phase of infection. The lowest concentration of SARS-CoV-2 viral copies this assay can detect is 131 copies/mL. A negative result does not preclude SARS-Cov-2 infection and should not be used as the sole basis for treatment or other patient management decisions. A negative result may occur with  improper specimen collection/handling, submission of specimen other than nasopharyngeal swab, presence of viral mutation(s) within the areas targeted by this assay, and inadequate number of viral copies (<131 copies/mL). A negative result must be combined with clinical observations, patient history, and epidemiological information. The expected result is Negative.  Fact  Sheet for Patients:  https://www.moore.com/  Fact Sheet for Healthcare Providers:  https://www.young.biz/  This test is no t yet approved or cleared by the [place] FDA and  has been authorized for detection and/or diagnosis of SARS-CoV-2 by FDA under an Emergency Use Authorization (EUA). This  EUA will remain  in effect (meaning this test can be used) for the duration of the COVID-19 declaration under Section 564(b)(1) of the Act, 21 U.S.C. section 360bbb-3(b)(1), unless the authorization is terminated or revoked sooner.     Influenza A by PCR NEGATIVE NEGATIVE Final   Influenza B by PCR NEGATIVE NEGATIVE Final    Comment: (NOTE) The Xpert Xpress SARS-CoV-2/FLU/RSV assay is intended as an aid in  the diagnosis of influenza from Nasopharyngeal swab specimens and  should not be used as a sole basis for treatment. Nasal washings and  aspirates are unacceptable for Xpert Xpress SARS-CoV-2/FLU/RSV  testing.  Fact Sheet for Patients: https://www.moore.com/  Fact Sheet for Healthcare Providers: https://www.young.biz/  This test is not yet approved or cleared by the [place] FDA and  has been authorized for detection and/or diagnosis of SARS-CoV-2 by  FDA under an Emergency Use Authorization (EUA). This EUA will remain  in effect (meaning this test can be used) for the duration of the  Covid-19 declaration under Section 564(b)(1) of the Act, 21  U.S.C. section 360bbb-3(b)(1), unless the authorization is  terminated or revoked. Performed at [facility], [address], [place], [place] [address]   MRSA PCR Screening     Status: None   Collection Time: 01/11/20  8:34 PM   Specimen: Nasal Mucosa; Nasopharyngeal  Result Value Ref Range Status   MRSA by PCR NEGATIVE NEGATIVE Final    Comment:        The GeneXpert MRSA Assay (FDA approved for NASAL specimens only), is one component of  a comprehensive MRSA colonization surveillance program. It is not intended to diagnose MRSA infection nor to guide or monitor treatment for MRSA infections. Performed at [facility], [address] [place], [place] [address]   Respiratory Panel by RT PCR (Flu A&B, Covid) - Nasopharyngeal Swab     Status: None   Collection Time: 01/17/20  1:29 PM   Specimen: Nasopharyngeal Swab  Result Value Ref Range Status   SARS Coronavirus 2 by RT PCR NEGATIVE NEGATIVE Final    Comment: (NOTE) SARS-CoV-2 target nucleic acids are NOT DETECTED.  The SARS-CoV-2 RNA is generally detectable in upper respiratoy specimens during the acute phase of infection. The lowest concentration of SARS-CoV-2 viral copies this assay can detect is 131 copies/mL. A negative result does not preclude SARS-Cov-2 infection and should not be used as the sole basis for treatment or other patient management decisions. A negative result may occur with  improper specimen collection/handling, submission of specimen other than nasopharyngeal swab, presence of viral mutation(s) within the areas targeted by this assay, and inadequate number of viral copies (<131 copies/mL). A negative result must be combined with clinical observations, patient history, and epidemiological information. The expected result is Negative.  Fact Sheet for Patients:  https://www.moore.com/  Fact Sheet for Healthcare Providers:  https://www.young.biz/  This test is no t yet approved or cleared by the [place] FDA and  has been authorized for detection and/or diagnosis of SARS-CoV-2 by FDA under an Emergency Use Authorization (EUA). This EUA will remain  in effect (meaning this test can be used) for the duration of the COVID-19 declaration under Section 564(b)(1) of the Act, 21 U.S.C. section 360bbb-3(b)(1), unless the authorization is terminated or revoked sooner.     Influenza A by PCR NEGATIVE  NEGATIVE Final   Influenza B by PCR NEGATIVE NEGATIVE Final    Comment: (NOTE) The Xpert Xpress SARS-CoV-2/FLU/RSV assay is intended as an aid in  the diagnosis of influenza from Nasopharyngeal swab specimens and  should not be used as a sole basis for treatment. Nasal washings and  aspirates are unacceptable for Xpert Xpress SARS-CoV-2/FLU/RSV  testing.  Fact Sheet for Patients: https://www.moore.com/  Fact Sheet for Healthcare Providers: https://www.young.biz/  This test is not yet approved or cleared by the [place] FDA and  has been authorized for detection and/or diagnosis of SARS-CoV-2 by  FDA under an Emergency Use Authorization (EUA). This EUA will remain  in effect (meaning this test can be used) for the duration of the  Covid-19 declaration under Section 564(b)(1) of the Act, 21  U.S.C. section 360bbb-3(b)(1), unless the authorization is  terminated or revoked. Performed at [facility], [address], [place], [place] [address]          Radiology Studies: No results found.      Scheduled Meds: . Chlorhexidine Gluconate Cloth  6 each Topical Daily  . folic acid  1 mg Oral Daily  . heparin  5,000 Units Subcutaneous Q8H  . multivitamin with minerals  1 tablet Oral Daily  . nicotine  21 mg Transdermal Daily  . pantoprazole  40 mg Intravenous Q12H  . thiamine  100 mg Oral Daily   Or  . thiamine  100 mg Intravenous Daily   Continuous Infusions: . sodium chloride 75 mL/hr at 01/18/20 1515     LOS: 8 days    Time spent: 30 minutes    Romesha Scherer Hoover Brunette, DO [facility]  If 7PM-7AM, please contact night-coverage [email or web address] 01/19/2020, 11:03 AM

## 2020-01-19 NOTE — Progress Notes (Signed)
[facility] Progress Note    Assessment/ Plan:   1. Hyponatremia, severe- Uosm 211, Sosm 225, Urine Na 15 consistent with poor solute intake and beer potomania.  Overall improved.  Na 129 10/21 but down to 127 10/22 off IVFs and then 123 this am, now back up to 127.    Goal serum Na would be 130 or greater maintained off IVFs.   Starting ure-na 15g BID, can uptitrate this to 30g bid if needed. Recommend that he take protein supplements I.e. ensure (~210mg  of sodium in each serving), encouraged increased solute intake 2. Dysuria: ua and ucx pending 3. Acute metabolic encephalopathy- CT scan of head negative. resolved 4. Alcohol abuse- continue with CIWA protocol per primary 5. HTN- actually had hypotension and meds on hold. BP stable for now. Would recommend starting norvasc 5mg  daily to start with 6. Heme + stools per primary. Hgb stable.  7. Hypokalemia: replete prn. Currently stable  Dispo: pending maintenance of serum sodium of 130 or higher off IVFs.  PT recommending SNF which he is agreeable to.  Subjective:    Na down to 123 this am, checked later was 127. Receiving NS. Reports dysuria which is being worked up by primary. Has been keeping up with his meals . Denies swelling, dizziness, changes in vision, nausea/vomiting, headaches.   Objective:   BP (!) 146/84 (BP Location: Left Arm)   Pulse 89   Temp 98.4 F (36.9 C) (Oral)   Resp 17   Ht 6' (1.829 m)   Wt 75 kg   SpO2 97%   BMI 22.42 kg/m   Intake/Output Summary (Last 24 hours) at 01/19/2020 1346 Last data filed at 01/19/2020 01/21/2020 Gross per 24 hour  Intake 1376.55 ml  Output 900 ml  Net 476.55 ml   Weight change:   Physical Exam: Gen: NAD, sitting up in chair CVS: RRR Resp: clear Abd: no tenderness Ext: no LE edema NEURO: AAO x 3, appears to have some delayed processing  Imaging: No results found.  Labs: BMET Recent Labs  Lab 01/15/20 0212 01/15/20 0212 01/15/20 0708  01/16/20 0330 01/17/20 0444 01/18/20 0400 01/18/20 1129 01/19/20 0617 01/19/20 1147  NA 122*   < > 125* 125* 129* 127* 127* 123* 127*  K 3.2*  --   --  3.0* 3.2* 3.9 4.3 3.7 4.3  CL 90*  --   --  89* 93* 94* 93* 91* 89*  CO2 25  --   --  26 27 26 27 23 24   GLUCOSE 111*  --   --  119* 112* 111* 119* 111* 112*  BUN <5*  --   --  7* 6* 7* 8 8 9   CREATININE 0.54*  --   --  0.59* 0.56* 0.52* 0.71 0.58* 0.77  CALCIUM 7.8*  --   --  8.3* 8.7* 8.8* 8.7* 8.5* 9.6  PHOS 3.2  --   --  3.3  --   --   --   --   --    < > = values in this interval not displayed.   CBC Recent Labs  Lab 01/13/20 0601 01/15/20 0212 01/16/20 1402 01/17/20 0444  WBC 8.7 7.8 8.6 8.1  HGB 13.4 12.7* 12.7* 12.4*  HCT 36.6* 36.3* 36.7* 35.2*  MCV 87.8 90.8 92.2 91.0  PLT 193 219 251 258    Medications:    . Chlorhexidine Gluconate Cloth  6 each Topical Daily  . folic acid  1 mg Oral Daily  . heparin  5,000 Units Subcutaneous Q8H  . multivitamin with minerals  1 tablet Oral Daily  . nicotine  21 mg Transdermal Daily  . NON FORMULARY 15 g  15 g Oral BID  . pantoprazole  40 mg Intravenous Q12H  . thiamine  100 mg Oral Daily   Or  . thiamine  100 mg Intravenous Daily      Anthony Sar, MD [facility] 01/19/2020, 1:46 PM

## 2020-01-20 DIAGNOSIS — E871 Hypo-osmolality and hyponatremia: Secondary | ICD-10-CM | POA: Diagnosis not present

## 2020-01-20 LAB — BASIC METABOLIC PANEL
Anion gap: 8 (ref 5–15)
BUN: 18 mg/dL (ref 8–23)
CO2: 25 mmol/L (ref 22–32)
Calcium: 8.7 mg/dL — ABNORMAL LOW (ref 8.9–10.3)
Chloride: 93 mmol/L — ABNORMAL LOW (ref 98–111)
Creatinine, Ser: 0.56 mg/dL — ABNORMAL LOW (ref 0.61–1.24)
GFR, Estimated: >60 mL/min (ref >60)
Glucose, Bld: 112 mg/dL — ABNORMAL HIGH (ref 70–99)
Potassium: 3.8 mmol/L (ref 3.5–5.1)
Sodium: 126 mmol/L — ABNORMAL LOW (ref 135–145)

## 2020-01-20 MED ORDER — PANTOPRAZOLE SODIUM 40 MG PO TBEC
40.0000 mg | DELAYED_RELEASE_TABLET | Freq: Two times a day (BID) | ORAL | Status: DC
  Administered 2020-01-20 – 2020-01-22 (×4): 40 mg via ORAL
  Filled 2020-01-20 (×4): qty 1

## 2020-01-20 NOTE — Progress Notes (Signed)
Labs reviewed remotely. Na 126. Just started ure-na. If no improvement tomorrow then can increase to 30g bid. Increase solute intake, maintain fluid restriction, encouraged protein intake, replete K prn.  Anthony Sar, MD [facility]

## 2020-01-20 NOTE — Plan of Care (Signed)

## 2020-01-20 NOTE — Progress Notes (Signed)
PROGRESS NOTE    Barry Fowler  EZM:[id number] DOB: 28-Mar-1956 DOA: 01/11/2020 PCP: Patient, No Pcp Per   Brief Narrative:  Barry Fowler a63 y.o.male,with past medical history of hypertension, hyperlipidemia, tobacco abuse, 1 pack/day for last 30 years, sister at bedside assist with the history, patient was started last month on losartan for high blood pressure, patient with heavy alcohol abuse, up to 12 beers per day, patient presents to ED secondary couple weeks of generalized weakness, as well for last few days he had diminished appetite, and there was some questionable left-sided facial droop, as well as some nausea, and one episode of dark bowel movement, which prompted family to call EMS, patient lives with his nephew, patient denies any chest pain, denies any shortness of breath, headache, abdominal pain, vomiting, coffee-ground emesis. - in EDpatient was noted to have sodium of 106, cultures pending, but hemoglobin is stable at 13.2, his potassium is 3.6, CT head with no acute findings, COVID-19 test has been negative, UA with no acute finding, negative urine drug screen, UA and chest x-ray is pending.  10/20:Patient continues to have minimal improvement in sodium levels at 125 this morning. Per nephrology, plan to continue ongoing IV infusion and close monitoring. Advance diet as tolerated with no plans for endoscopy while inpatient. Hemoglobin will be reevaluated in a.m. No overt bleeding noted.  10/21:Sodium levels continue to further improve up to 129 this morning and patient is overall feeling much better. Plan to discontinue IV fluid as discussed with nephrology and ensure stability prior to anticipated discharge in a.m. Bowel movements with no overt bleeding noted and stable hemoglobin levels.  10/22: Patient has remained off normal saline infusion and unfortunately continues to have persistently low sodium levels with sodium at 127 noted this a.m. and confirmed on  repeat this afternoon. He will need to remain on further IV normal saline infusion as recommended by nephrology. Encouraged to eat.  10/23: Patient noted to have worsening hyponatremia today and had been resumed on IV normal saline yesterday.  Nephrology to reevaluate and consider treatment with urea to further assist.  10/24: Patient noted to still have some hyponatremia with sodium of 126 this morning.  He was noted to have dysuria yesterday confirming UTI on urine analysis with urine culture still pending.  Started on Rocephin on 10/23 with improvement in symptoms this morning.   Assessment & Plan:   Active Problems:   Hyponatremia   Alcohol abuse   Abnormal LFTs (liver function tests)   Melena   Hyponatremia-persistent -Likely related to beer drinkers potomania -Appreciate nephrology assistance -Unfortunately, he has had a decline in sodium levels this morning to 123 -Continue IV normal saline and urea supplementation with likely plan for increase in dosing  UTI -Continue Rocephin empirically for treatment -Urine cultures pending  Hypokalemia-resolved -Recheck BMP in a.m.  Acute metabolic encephalopathy-resolved -CT head negative -Likely secondary to above  Alcohol abuse -No signs of withdrawal at this time -Encourage cessation of alcohol use moving forward  Hypertension-stable -Losartan currently on hold, but will resume on discharge -Blood pressures are stable  Hyperlipidemia -Plan to resume statin on discharge  GI bleeding-appears resolved -Patient noted to have dark-colored stools that were heme positive -He was taking Mobic prior to admission, avoid on dc -Continue on ProtonixBID -Refuses EGD,follow-up outpatient  Elevated LFTs-resolved -Abdominal ultrasound negative -Although suspect this is related to alcohol -Viral hepatitis panel negative   DVT prophylaxis:Heparin Code Status: Full Family Communication: Discussed with  patient Disposition Plan:  Status is:  Inpatient  Remains inpatient appropriate because:IV treatments appropriate due to intensity of illness or inability to take PO and Inpatient level of care appropriate due to severity of illness   Dispo: The patient is from: Home  Anticipated d/c is to: SNF  Anticipated d/c date is: 2 days  Patient currently is not medically stable to d/c.  Patient continues to have ongoing hyponatremia requiring treatment.   Consultants:   Nephrology   GI  Procedures:   None  Antimicrobials:  Anti-infectives (From admission, onward)   Start     Dose/Rate Route Frequency Ordered Stop   01/19/20 1730  cefTRIAXone (ROCEPHIN) 1 g in sodium chloride 0.9 % 100 mL IVPB        1 g 200 mL/hr over 30 Minutes Intravenous Every 24 hours 01/19/20 1641         Subjective: Patient seen and evaluated today with no new acute complaints or concerns. No acute concerns or events noted overnight.  He states that his symptoms of dysuria have improved.  Objective: Vitals:   01/19/20 0559 01/19/20 0823 01/19/20 2120 01/20/20 0500  BP: (!) 151/90 (!) 146/84 134/90 (!) 149/73  Pulse: 87 89 74 74  Resp: Temp: 98.7 F (37.1 C) 98.4 F (36.9 C) 97.6 F (36.4 C) 98 F (36.7 C)  TempSrc: Oral Oral Oral   SpO2: 93% 97% 96% 95%  Weight:      Height:        Intake/Output Summary (Last 24 hours) at 01/20/2020 1129 Last data filed at 01/20/2020 0928 Gross per 24 hour  Intake 3079.91 ml  Output 2400 ml  Net 679.91 ml   Filed Weights   01/11/20 2200 01/14/20 0400 01/16/20 0500  Weight: 75.1 kg 74.9 kg 75 kg    Examination:  General exam: Appears calm and comfortable  Respiratory system: Clear to auscultation. Respiratory effort normal. Cardiovascular system: S1 & S2 heard, RRR.  Gastrointestinal system: Abdomen is nondistended, soft and nontender.  Central nervous system: Alert and awake Extremities: No  edema Skin: No rashes, lesions or ulcers Psychiatry: Judgement and insight appear normal. Mood & affect appropriate.     Data Reviewed: I have personally reviewed following labs and imaging studies  CBC: Recent Labs  Lab 01/15/20 0212 01/16/20 1402 01/17/20 0444  WBC 7.8 8.6 8.1  HGB 12.7* 12.7* 12.4*  HCT 36.3* 36.7* 35.2*  MCV 90.8 92.2 91.0  PLT 219 251 258   Basic Metabolic Panel: Recent Labs  Lab 01/14/20 0515 01/14/20 1059 01/15/20 0212 01/15/20 0708 01/16/20 0330 01/16/20 0330 01/17/20 0444 01/17/20 0444 01/18/20 0400 01/18/20 1129 01/19/20 0617 01/19/20 1147 01/20/20 0616  NA 120*   < > 122*   < > 125*   < > 129*   < > 127* 127* 123* 127* 126*  K 3.5  --  3.2*  --  3.0*   < > 3.2*   < > 3.9 4.3 3.7 4.3 3.8  CL 87*  --  90*  --  89*   < > 93*   < > 94* 93* 91* 89* 93*  CO2 23  --  25  --  26   < > 27   < > GLUCOSE 99  --  111*  --  119*   < > 112*   < > 111* 119* 111* 112* 112*  BUN <5*  --  <5*  --  7*   < > 6*   < >  7* 8 8 9 18   CREATININE 0.57*  --  0.54*  --  0.59*   < > 0.56*   < > 0.52* 0.71 0.58* 0.77 0.56*  CALCIUM 8.2*  --  7.8*  --  8.3*   < > 8.7*   < > 8.8* 8.7* 8.5* 9.6 8.7*  MG 1.8  --   --   --   --   --  1.7  --  1.9  --  1.7  --   --   PHOS  --   --  3.2  --  3.3  --   --   --   --   --   --   --   --    < > = values in this interval not displayed.   GFR: Estimated Creatinine Clearance: 101.6 mL/min (A) (by C-G formula based on SCr of 0.56 mg/dL (L)). Liver Function Tests: Recent Labs  Lab 01/15/20 0212 01/16/20 0330  AST 20  --   ALT 17  --   ALKPHOS 51  --   BILITOT 1.0  --   PROT 6.0*  --   ALBUMIN 2.8* 2.7*   No results for input(s): LIPASE, AMYLASE in the last 168 hours. No results for input(s): AMMONIA in the last 168 hours. Coagulation Profile: No results for input(s): INR, PROTIME in the last 168 hours. Cardiac Enzymes: No results for input(s): CKTOTAL, CKMB, CKMBINDEX, TROPONINI in the last 168  hours. BNP (last 3 results) No results for input(s): PROBNP in the last 8760 hours. HbA1C: No results for input(s): HGBA1C in the last 72 hours. CBG: No results for input(s): GLUCAP in the last 168 hours. Lipid Profile: No results for input(s): CHOL, HDL, LDLCALC, TRIG, CHOLHDL, LDLDIRECT in the last 72 hours. Thyroid Function Tests: No results for input(s): TSH, T4TOTAL, FREET4, T3FREE, THYROIDAB in the last 72 hours. Anemia Panel: No results for input(s): VITAMINB12, FOLATE, FERRITIN, TIBC, IRON, RETICCTPCT in the last 72 hours. Sepsis Labs: No results for input(s): PROCALCITON, LATICACIDVEN in the last 168 hours.  Recent Results (from the past 240 hour(s))  Blood culture (routine x 2)     Status: None   Collection Time: 01/11/20 12:10 PM   Specimen: BLOOD LEFT ARM  Result Value Ref Range Status   Specimen Description BLOOD LEFT ARM  Final   Special Requests   Final    BOTTLES DRAWN AEROBIC AND ANAEROBIC Blood Culture adequate volume   Culture   Final    NO GROWTH 5 DAYS Performed at [facility], [address], [place], [place]    Report Status 01/16/2020 FINAL  Final  Blood culture (routine x 2)     Status: None   Collection Time: 01/11/20 12:11 PM   Specimen: BLOOD RIGHT FOREARM  Result Value Ref Range Status   Specimen Description BLOOD RIGHT FOREARM  Final   Special Requests Blood Culture adequate volume  Final   Culture   Final    NO GROWTH 5 DAYS Performed at [facility], [address] [place], [place]    Report Status 01/16/2020 FINAL  Final  Respiratory Panel by RT PCR (Flu A&B, Covid) - Nasopharyngeal Swab     Status: None   Collection Time: 01/11/20 12:25 PM   Specimen: Nasopharyngeal Swab  Result Value Ref Range Status   SARS Coronavirus 2 by RT PCR NEGATIVE NEGATIVE Final    Comment: (NOTE) SARS-CoV-2 target nucleic acids are NOT DETECTED.  The SARS-CoV-2 RNA is generally  detectable in upper respiratoy specimens during the acute  phase of infection. The lowest concentration of SARS-CoV-2 viral copies this assay can detect is 131 copies/mL. A negative result does not preclude SARS-Cov-2 infection and should not be used as the sole basis for treatment or other patient management decisions. A negative result may occur with  improper specimen collection/handling, submission of specimen other than nasopharyngeal swab, presence of viral mutation(s) within the areas targeted by this assay, and inadequate number of viral copies (<131 copies/mL). A negative result must be combined with clinical observations, patient history, and epidemiological information. The expected result is Negative.  Fact Sheet for Patients:  https://www.moore.com/https://www.fda.gov/media/142436/download  Fact Sheet for Healthcare Providers:  https://www.young.biz/https://www.fda.gov/media/142435/download  This test is no t yet approved or cleared by the [place] FDA and  has been authorized for detection and/or diagnosis of SARS-CoV-2 by FDA under an Emergency Use Authorization (EUA). This EUA will remain  in effect (meaning this test can be used) for the duration of the COVID-19 declaration under Section 564(b)(1) of the Act, 21 U.S.C. section 360bbb-3(b)(1), unless the authorization is terminated or revoked sooner.     Influenza A by PCR NEGATIVE NEGATIVE Final   Influenza B by PCR NEGATIVE NEGATIVE Final    Comment: (NOTE) The Xpert Xpress SARS-CoV-2/FLU/RSV assay is intended as an aid in  the diagnosis of influenza from Nasopharyngeal swab specimens and  should not be used as a sole basis for treatment. Nasal washings and  aspirates are unacceptable for Xpert Xpress SARS-CoV-2/FLU/RSV  testing.  Fact Sheet for Patients: https://www.moore.com/https://www.fda.gov/media/142436/download  Fact Sheet for Healthcare Providers: https://www.young.biz/https://www.fda.gov/media/142435/download  This test is not yet approved or cleared by the [place] FDA and  has been authorized for detection and/or diagnosis of  SARS-CoV-2 by  FDA under an Emergency Use Authorization (EUA). This EUA will remain  in effect (meaning this test can be used) for the duration of the  Covid-19 declaration under Section 564(b)(1) of the Act, 21  U.S.C. section 360bbb-3(b)(1), unless the authorization is  terminated or revoked. Performed at [facility], [address] [place], [place] [address]   MRSA PCR Screening     Status: None   Collection Time: 01/11/20  8:34 PM   Specimen: Nasal Mucosa; Nasopharyngeal  Result Value Ref Range Status   MRSA by PCR NEGATIVE NEGATIVE Final    Comment:        The GeneXpert MRSA Assay (FDA approved for NASAL specimens only), is one component of a comprehensive MRSA colonization surveillance program. It is not intended to diagnose MRSA infection nor to guide or monitor treatment for MRSA infections. Performed at [facility], [address] [place], [place] [address]   Respiratory Panel by RT PCR (Flu A&B, Covid) - Nasopharyngeal Swab     Status: None   Collection Time: 01/17/20  1:29 PM   Specimen: Nasopharyngeal Swab  Result Value Ref Range Status   SARS Coronavirus 2 by RT PCR NEGATIVE NEGATIVE Final    Comment: (NOTE) SARS-CoV-2 target nucleic acids are NOT DETECTED.  The SARS-CoV-2 RNA is generally detectable in upper respiratoy specimens during the acute phase of infection. The lowest concentration of SARS-CoV-2 viral copies this assay can detect is 131 copies/mL. A negative result does not preclude SARS-Cov-2 infection and should not be used as the sole basis for treatment or other patient management decisions. A negative result may occur with  improper specimen collection/handling, submission of specimen other than nasopharyngeal swab, presence of viral mutation(s) within the areas targeted  by this assay, and inadequate number of viral copies (<131 copies/mL). A negative result must be combined with clinical observations, patient history, and epidemiological  information. The expected result is Negative.  Fact Sheet for Patients:  https://www.moore.com/  Fact Sheet for Healthcare Providers:  https://www.young.biz/  This test is no t yet approved or cleared by the [place] FDA and  has been authorized for detection and/or diagnosis of SARS-CoV-2 by FDA under an Emergency Use Authorization (EUA). This EUA will remain  in effect (meaning this test can be used) for the duration of the COVID-19 declaration under Section 564(b)(1) of the Act, 21 U.S.C. section 360bbb-3(b)(1), unless the authorization is terminated or revoked sooner.     Influenza A by PCR NEGATIVE NEGATIVE Final   Influenza B by PCR NEGATIVE NEGATIVE Final    Comment: (NOTE) The Xpert Xpress SARS-CoV-2/FLU/RSV assay is intended as an aid in  the diagnosis of influenza from Nasopharyngeal swab specimens and  should not be used as a sole basis for treatment. Nasal washings and  aspirates are unacceptable for Xpert Xpress SARS-CoV-2/FLU/RSV  testing.  Fact Sheet for Patients: https://www.moore.com/  Fact Sheet for Healthcare Providers: https://www.young.biz/  This test is not yet approved or cleared by the [place] FDA and  has been authorized for detection and/or diagnosis of SARS-CoV-2 by  FDA under an Emergency Use Authorization (EUA). This EUA will remain  in effect (meaning this test can be used) for the duration of the  Covid-19 declaration under Section 564(b)(1) of the Act, 21  U.S.C. section 360bbb-3(b)(1), unless the authorization is  terminated or revoked. Performed at [facility], [address], [place], [place] [address]          Radiology Studies: No results found.      Scheduled Meds: . Chlorhexidine Gluconate Cloth  6 each Topical Daily  . folic acid  1 mg Oral Daily  . heparin  5,000 Units Subcutaneous Q8H  . multivitamin with minerals  1 tablet Oral  Daily  . nicotine  21 mg Transdermal Daily  . pantoprazole  40 mg Oral BID  . thiamine  100 mg Oral Daily   Or  . thiamine  100 mg Intravenous Daily  . Urea  15 g Oral BID   Continuous Infusions: . sodium chloride 75 mL/hr at 01/20/20 0532  . cefTRIAXone (ROCEPHIN)  IV 1 g (01/19/20 1731)     LOS: 9 days    Time spent: 30 minutes    Gershom Brobeck Hoover Brunette, DO [facility]  If 7PM-7AM, please contact night-coverage [email or web address] 01/20/2020, 11:29 AM

## 2020-01-21 DIAGNOSIS — E871 Hypo-osmolality and hyponatremia: Secondary | ICD-10-CM | POA: Diagnosis not present

## 2020-01-21 LAB — RESPIRATORY PANEL BY RT PCR (FLU A&B, COVID)
Influenza A by PCR: NEGATIVE
Influenza B by PCR: NEGATIVE
SARS Coronavirus 2 by RT PCR: NEGATIVE

## 2020-01-21 LAB — BASIC METABOLIC PANEL
Anion gap: 9 (ref 5–15)
BUN: 25 mg/dL — ABNORMAL HIGH (ref 8–23)
CO2: 25 mmol/L (ref 22–32)
Calcium: 8.7 mg/dL — ABNORMAL LOW (ref 8.9–10.3)
Chloride: 95 mmol/L — ABNORMAL LOW (ref 98–111)
Creatinine, Ser: 0.59 mg/dL — ABNORMAL LOW (ref 0.61–1.24)
GFR, Estimated: >60 mL/min (ref >60)
Glucose, Bld: 106 mg/dL — ABNORMAL HIGH (ref 70–99)
Potassium: 3.9 mmol/L (ref 3.5–5.1)
Sodium: 129 mmol/L — ABNORMAL LOW (ref 135–145)

## 2020-01-21 NOTE — TOC Progression Note (Addendum)
Transition of Care [facility]) - Progression Note    Patient Details  Name: Barry Fowler MRN: [id number] Date of Birth: 09/29/1955  [facility] CM/SW Contact  [place], [place] Phone Number: 01/21/2020, 1:08 PM  Clinical Narrative:    CSW spoke with Gavin Pound at [facility] to confirm that pt can be d/c to facility tomorrow 10/26. Gavin Pound confirmed. Pt is having COVID test today. Attending Dr. Sherryll Burger and RN updated. TOC to send d/c summary and orders when pt is ready.   TOC received call regarding pts insurance auth. Auth BW:[id number] Reference ID: [id number]. Start date: 01/21/20, next review date: 01/23/20. This approval is good for three days. Conni Slipper is the assigned worker for additional clinicals [phone]. TOC to follow.    Expected Discharge Plan: Skilled Nursing Facility Barriers to Discharge: Continued Medical Work up  Expected Discharge Plan and Services Expected Discharge Plan: Skilled Nursing Facility       Living arrangements for the past 2 months: Single Family Home                                       Social Determinants of Health (SDOH) Interventions    Readmission Risk Interventions No flowsheet data found.

## 2020-01-21 NOTE — Progress Notes (Signed)
PROGRESS NOTE    Barry Fowler  YKD:[id number] DOB: [birth year] DOA: 01/11/2020 PCP: Patient, No Pcp Per   Brief Narrative:  [name],with past medical history of hypertension, hyperlipidemia, tobacco abuse, 1 pack/day for last 30 years, sister at bedside assist with the history, patient was started last month on losartan for high blood pressure, patient with heavy alcohol abuse, up to 12 beers per day, patient presents to ED secondary couple weeks of generalized weakness, as well for last few days he had diminished appetite, and there was some questionable left-sided facial droop, as well as some nausea, and one episode of dark bowel movement, which prompted family to call EMS, patient lives with his nephew, patient denies any chest pain, denies any shortness of breath, headache, abdominal pain, vomiting, coffee-ground emesis. - in EDpatient was noted to have sodium of 106, cultures pending, but hemoglobin is stable at 13.2, his potassium is 3.6, CT head with no acute findings, COVID-19 test has been negative, UA with no acute finding, negative urine drug screen, UA and chest x-ray is pending.  10/20:Patient continues to have minimal improvement in sodium levels at 125 this morning. Per nephrology, plan to continue ongoing IV infusion and close monitoring. Advance diet as tolerated with no plans for endoscopy while inpatient. Hemoglobin will be reevaluated in a.m. No overt bleeding noted.  10/21:Sodium levels continue to further improve up to 129 this morning and patient is overall feeling much better. Plan to discontinue IV fluid as discussed with nephrology and ensure stability prior to anticipated discharge in a.m. Bowel movements with no overt bleeding noted and stable hemoglobin levels.  10/22: Patient has remained off normal saline infusion and unfortunately continues to have persistently low sodium levels with sodium at 127 noted this a.m. and confirmed on  repeat this afternoon. He will need to remain on further IV normal saline infusion as recommended by nephrology. Encouraged to eat.  10/23:Patient noted to have worsening hyponatremia today and had been resumed on IV normal saline yesterday. Nephrology to reevaluate and consider treatment with urea to further assist.  10/24: Patient noted to still have some hyponatremia with sodium of 126 this morning.  He was noted to have dysuria yesterday confirming UTI on urine analysis with urine culture still pending.  Started on Rocephin on 10/23 with improvement in symptoms this morning.   10/25: Patient continues to have hyponatremia on IV fluid with sodium 129.  Nephrology plans to continue current course of treatment, now off IV fluid.  If sodium levels remain stable, may be eligible for discharge in a.m.  Repeat Covid test.  Assessment & Plan:   Active Problems:   Hyponatremia   Alcohol abuse   Abnormal LFTs (liver function tests)   Melena   Hyponatremia-persistent -Likely related to beer drinkers potomania -Appreciate nephrology assistance -Sodium level to 129 this AM. -Continue urea supplementation as currently prescribed and monitor repeat labs in a.m.  E. coli UTI -Continue Rocephin empirically for treatment -Sensitivities pending  Hypokalemia-resolved -RecheckBMP in a.m.  Acute metabolic encephalopathy-resolved -CT head negative -Likely secondary to above  Alcohol abuse -No signs of withdrawal at this time -Encourage cessation of alcohol use moving forward  Hypertension-stable -Losartan currently on hold, but will resume on discharge -Blood pressures are stable  Hyperlipidemia -Plan to resume statin on discharge  GI bleeding-appears resolved -Patient noted to have dark-colored stools that were heme positive -He was taking Mobic prior to admission, avoid on dc -Continue on ProtonixBID -Refuses EGD,follow-up outpatient  Elevated  LFTs-resolved -Abdominal ultrasound negative -Although suspect this is related to alcohol -Viral hepatitis panel negative   DVT prophylaxis:Heparin Code Status:Full Family Communication:Discussed with patient Disposition Plan: Status is: Inpatient  Remains inpatient appropriate because:IV treatments appropriate due to intensity of illness or inability to take PO and Inpatient level of care appropriate due to severity of illness   Dispo: The patient is from:Home Anticipated d/c is to:SNF Anticipated d/c date is: 1 day Patient currently is not medically stable to d/c.Patient continues to have ongoing hyponatremia requiring treatment.   Consultants:  Nephrology   GI  Procedures:  None  Antimicrobials:  Anti-infectives (From admission, onward)   Start     Dose/Rate Route Frequency Ordered Stop   01/19/20 1730  cefTRIAXone (ROCEPHIN) 1 g in sodium chloride 0.9 % 100 mL IVPB        1 g 200 mL/hr over 30 Minutes Intravenous Every 24 hours 01/19/20 1641         Subjective: Patient seen and evaluated today with no new acute complaints or concerns. No acute concerns or events noted overnight.  His sodium levels appear to be stabilizing.  He denies any further dysuria.  Objective: Vitals:   01/20/20 0500 01/20/20 1334 01/20/20 2102 01/21/20 0556  BP: (!) 149/73 133/75 128/83 (!) 152/92  Pulse: 74 95 95 74  Resp: 16 18 20 20   Temp: 98 F (36.7 C) 97.9 F (36.6 C) 99.2 F (37.3 C) 98.3 F (36.8 C)  TempSrc:   Oral Oral  SpO2: 95% 95% 96% 92%  Weight:      Height:        Intake/Output Summary (Last 24 hours) at 01/21/2020 1048 Last data filed at 01/21/2020 0548 Gross per 24 hour  Intake 2792.35 ml  Output 3000 ml  Net -207.65 ml   Filed Weights   01/11/20 2200 01/14/20 0400 01/16/20 0500  Weight: 75.1 kg 74.9 kg 75 kg    Examination:  General exam: Appears calm and comfortable  Respiratory system:  Clear to auscultation. Respiratory effort normal. Cardiovascular system: S1 & S2 heard, RRR.  Gastrointestinal system: Abdomen is nondistended, soft and nontender.  Central nervous system: Alert and awake Extremities: No edema Skin: No rashes, lesions or ulcers Psychiatry: Flat affect    Data Reviewed: I have personally reviewed following labs and imaging studies  CBC: Recent Labs  Lab 01/15/20 0212 01/16/20 1402 01/17/20 0444  WBC 7.8 8.6 8.1  HGB 12.7* 12.7* 12.4*  HCT 36.3* 36.7* 35.2*  MCV 90.8 92.2 91.0  PLT 219 251 258   Basic Metabolic Panel: Recent Labs  Lab 01/15/20 0212 01/15/20 0708 01/16/20 0330 01/16/20 0330 01/17/20 0444 01/17/20 0444 01/18/20 0400 01/18/20 0400 01/18/20 1129 01/19/20 0617 01/19/20 1147 01/20/20 0616 01/21/20 0504  NA 122*   < > 125*   < > 129*   < > 127*   < > 127* 123* 127* 126* 129*  K 3.2*  --  3.0*   < > 3.2*   < > 3.9   < > 4.3 3.7 4.3 3.8 3.9  CL 90*  --  89*   < > 93*   < > 94*   < > 93* 91* 89* 93* 95*  CO2 25  --  26   < > 27   < > 26   < > 27 23 24 25 25   GLUCOSE 111*  --  119*   < > 112*   < > 111*   < > 119* 111* 112* 112*  106*  BUN <5*  --  7*   < > 6*   < > 7*   < > 8 8 9 18  25*  CREATININE 0.54*  --  0.59*   < > 0.56*   < > 0.52*   < > 0.71 0.58* 0.77 0.56* 0.59*  CALCIUM 7.8*  --  8.3*   < > 8.7*   < > 8.8*   < > 8.7* 8.5* 9.6 8.7* 8.7*  MG  --   --   --   --  1.7  --  1.9  --   --  1.7  --   --   --   PHOS 3.2  --  3.3  --   --   --   --   --   --   --   --   --   --    < > = values in this interval not displayed.   GFR: Estimated Creatinine Clearance: 101.6 mL/min (A) (by C-G formula based on SCr of 0.59 mg/dL (L)). Liver Function Tests: Recent Labs  Lab 01/15/20 0212 01/16/20 0330  AST 20  --   ALT 17  --   ALKPHOS 51  --   BILITOT 1.0  --   PROT 6.0*  --   ALBUMIN 2.8* 2.7*   No results for input(s): LIPASE, AMYLASE in the last 168 hours. No results for input(s): AMMONIA in the last 168  hours. Coagulation Profile: No results for input(s): INR, PROTIME in the last 168 hours. Cardiac Enzymes: No results for input(s): CKTOTAL, CKMB, CKMBINDEX, TROPONINI in the last 168 hours. BNP (last 3 results) No results for input(s): PROBNP in the last 8760 hours. HbA1C: No results for input(s): HGBA1C in the last 72 hours. CBG: No results for input(s): GLUCAP in the last 168 hours. Lipid Profile: No results for input(s): CHOL, HDL, LDLCALC, TRIG, CHOLHDL, LDLDIRECT in the last 72 hours. Thyroid Function Tests: No results for input(s): TSH, T4TOTAL, FREET4, T3FREE, THYROIDAB in the last 72 hours. Anemia Panel: No results for input(s): VITAMINB12, FOLATE, FERRITIN, TIBC, IRON, RETICCTPCT in the last 72 hours. Sepsis Labs: No results for input(s): PROCALCITON, LATICACIDVEN in the last 168 hours.  Recent Results (from the past 240 hour(s))  Blood culture (routine x 2)     Status: None   Collection Time: 01/11/20 12:10 PM   Specimen: BLOOD LEFT ARM  Result Value Ref Range Status   Specimen Description BLOOD LEFT ARM  Final   Special Requests   Final    BOTTLES DRAWN AEROBIC AND ANAEROBIC Blood Culture adequate volume   Culture   Final    NO GROWTH 5 DAYS Performed at [facility], [address], [place], [place]    Report Status 01/16/2020 FINAL  Final  Blood culture (routine x 2)     Status: None   Collection Time: 01/11/20 12:11 PM   Specimen: BLOOD RIGHT FOREARM  Result Value Ref Range Status   Specimen Description BLOOD RIGHT FOREARM  Final   Special Requests Blood Culture adequate volume  Final   Culture   Final    NO GROWTH 5 DAYS Performed at [facility], [address] [place], [place]    Report Status 01/16/2020 FINAL  Final  Respiratory Panel by RT PCR (Flu A&B, Covid) - Nasopharyngeal Swab     Status: None   Collection Time: 01/11/20 12:25 PM   Specimen: Nasopharyngeal Swab  Result Value Ref Range Status   SARS  Coronavirus 2 by RT PCR  NEGATIVE NEGATIVE Final    Comment: (NOTE) SARS-CoV-2 target nucleic acids are NOT DETECTED.  The SARS-CoV-2 RNA is generally detectable in upper respiratoy specimens during the acute phase of infection. The lowest concentration of SARS-CoV-2 viral copies this assay can detect is 131 copies/mL. A negative result does not preclude SARS-Cov-2 infection and should not be used as the sole basis for treatment or other patient management decisions. A negative result may occur with  improper specimen collection/handling, submission of specimen other than nasopharyngeal swab, presence of viral mutation(s) within the areas targeted by this assay, and inadequate number of viral copies (<131 copies/mL). A negative result must be combined with clinical observations, patient history, and epidemiological information. The expected result is Negative.  Fact Sheethttps://www.moore.com/dia/142436/download  Fact Sheet for Healthcare Providers:  https://www.young.biz/  This test is no t yet approved or cleared by the [place] FDA and  has been authorized for detection and/or diagnosis of SARS-CoV-2 by FDA under an Emergency Use Authorization (EUA). This EUA will remain  in effect (meaning this test can be used) for the duration of the COVID-19 declaration under Section 564(b)(1) of the Act, 21 U.S.C. section 360bbb-3(b)(1), unless the authorization is terminated or revoked sooner.     Influenza A by PCR NEGATIVE NEGATIVE Final   Influenza B by PCR NEGATIVE NEGATIVE Final    Comment: (NOTE) The Xpert Xpress SARS-CoV-2/FLU/RSV assay is intended as an aid in  the diagnosis of influenza from Nasopharyngeal swab specimens and  should not be used as a sole basis for treatment. Nasal washings and  aspirates are unacceptable for Xpert Xpress SARS-CoV-2/FLU/RSV  testing.  Fact Sheet for Patients: https://www.moore.com/  Fact Sheet for  Healthcare Providers: https://www.young.biz/  This test is not yet approved or cleared by the [place] FDA and  has been authorized for detection and/or diagnosis of SARS-CoV-2 by  FDA under an Emergency Use Authorization (EUA). This EUA will remain  in effect (meaning this test can be used) for the duration of the  Covid-19 declaration under Section 564(b)(1) of the Act, 21  U.S.C. section 360bbb-3(b)(1), unless the authorization is  terminated or revoked. Performed at [facility], [address] [place], [place] [address]   MRSA PCR Screening     Status: None   Collection Time: 01/11/20  8:34 PM   Specimen: Nasal Mucosa; Nasopharyngeal  Result Value Ref Range Status   MRSA by PCR NEGATIVE NEGATIVE Final    Comment:        The GeneXpert MRSA Assay (FDA approved for NASAL specimens only), is one component of a comprehensive MRSA colonization surveillance program. It is not intended to diagnose MRSA infection nor to guide or monitor treatment for MRSA infections. Performed at [facility], [address] [place], [place] [address]   Respiratory Panel by RT PCR (Flu A&B, Covid) - Nasopharyngeal Swab     Status: None   Collection Time: 01/17/20  1:29 PM   Specimen: Nasopharyngeal Swab  Result Value Ref Range Status   SARS Coronavirus 2 by RT PCR NEGATIVE NEGATIVE Final    Comment: (NOTE) SARS-CoV-2 target nucleic acids are NOT DETECTED.  The SARS-CoV-2 RNA is generally detectable in upper respiratoy specimens during the acute phase of infection. The lowest concentration of SARS-CoV-2 viral copies this assay can detect is 131 copies/mL. A negative result does not preclude SARS-Cov-2 infection and should not be used as the sole basis for treatment or other patient management  decisions. A negative result may occur with  improper specimen collection/handling, submission of specimen other than nasopharyngeal swab, presence of viral mutation(s) within  the areas targeted by this assay, and inadequate number of viral copies (<131 copies/mL). A negative result must be combined with clinical observations, patient history, and epidemiological information. The expected result is Negative.  Fact Sheet for Patients:  https://www.moore.com/https://www.fda.gov/media/142436/download  Fact Sheet for Healthcare Providers:  https://www.young.biz/https://www.fda.gov/media/142435/download  This test is no t yet approved or cleared by the [place] FDA and  has been authorized for detection and/or diagnosis of SARS-CoV-2 by FDA under an Emergency Use Authorization (EUA). This EUA will remain  in effect (meaning this test can be used) for the duration of the COVID-19 declaration under Section 564(b)(1) of the Act, 21 U.S.C. section 360bbb-3(b)(1), unless the authorization is terminated or revoked sooner.     Influenza A by PCR NEGATIVE NEGATIVE Final   Influenza B by PCR NEGATIVE NEGATIVE Final    Comment: (NOTE) The Xpert Xpress SARS-CoV-2/FLU/RSV assay is intended as an aid in  the diagnosis of influenza from Nasopharyngeal swab specimens and  should not be used as a sole basis for treatment. Nasal washings and  aspirates are unacceptable for Xpert Xpress SARS-CoV-2/FLU/RSV  testing.  Fact Sheet for Patients: https://www.moore.com/https://www.fda.gov/media/142436/download  Fact Sheet for Healthcare Providers: https://www.young.biz/https://www.fda.gov/media/142435/download  This test is not yet approved or cleared by the [place] FDA and  has been authorized for detection and/or diagnosis of SARS-CoV-2 by  FDA under an Emergency Use Authorization (EUA). This EUA will remain  in effect (meaning this test can be used) for the duration of the  Covid-19 declaration under Section 564(b)(1) of the Act, 21  U.S.C. section 360bbb-3(b)(1), unless the authorization is  terminated or revoked. Performed at [facility], [address] [place], [place] [address]   Culture, Urine     Status: Abnormal (Preliminary result)    Collection Time: 01/19/20 11:03 AM   Specimen: Urine, Clean Catch  Result Value Ref Range Status   Specimen Description   Final    URINE, CLEAN CATCH Performed at [facility], [address] [place], [place] [address]    Special Requests   Final    NONE Performed at [facility], [address] [place], [place] [address]    Culture (A)  Final    >=100,000 COLONIES/mL ESCHERICHIA COLI SUSCEPTIBILITIES TO FOLLOW Performed at [facility], [address] [place], [place] [address]    Report Status PENDING  Incomplete         Radiology Studies: No results found.      Scheduled Meds: . folic acid  1 mg Oral Daily  . heparin  5,000 Units Subcutaneous Q8H  . multivitamin with minerals  1 tablet Oral Daily  . nicotine  21 mg Transdermal Daily  . pantoprazole  40 mg Oral BID  . thiamine  100 mg Oral Daily   Or  . thiamine  100 mg Intravenous Daily  . Urea  15 g Oral BID   Continuous Infusions: . cefTRIAXone (ROCEPHIN)  IV 1 g (01/20/20 1740)     LOS: 10 days    Time spent: 30 minutes    Wilberto Console Hoover Brunette Handy Mcloud, DO [facility]  If 7PM-7AM, please contact night-coverage [email or web address] 01/21/2020, 10:48 AM

## 2020-01-21 NOTE — Progress Notes (Signed)
[facility] Progress Note    Assessment/ Plan:   1. Hyponatremia, severe- Uosm 211, Sosm 225, Urine Na 15 consistent with poor solute intake and beer potomania.  Overall improved.    Goal serum Na would be 130 or greater maintained off IVFs.   Cont ure-na 15g BID for another 24h, I expect next check will be further improved.  If needed, can uptitrate this to 30g bid if needed.  2. E coli UTI ceftriaxone per TRH 3. Acute metabolic encephalopathy- CT scan of head negative. resolved 4. Alcohol abuse- per primary 5. HTN- no meds, monitor 6. Heme + stools per primary. Hgb stable.  7. Hypokalemia: replete prn. Currently stable   Subjective:    SNa 129 Ate entirety of breakfast Still on IVFs   Objective:   BP (!) 152/92 (BP Location: Left Arm)   Pulse 74   Temp 98.3 F (36.8 C) (Oral)   Resp 20   Ht 6' (1.829 m)   Wt 75 kg   SpO2 92%   BMI 22.42 kg/m   Intake/Output Summary (Last 24 hours) at 01/21/2020 0935 Last data filed at 01/21/2020 0548 Gross per 24 hour  Intake 2792.35 ml  Output 3000 ml  Net -207.65 ml   Weight change:   Physical Exam: Gen: NAD, sitting up in chair CVS: RRR Resp: clear Abd: no tenderness Ext: no LE edema NEURO: AAO x 3, appears to have some delayed processing  Imaging: No results found.  Labs: BMET Recent Labs  Lab 01/15/20 0212 01/15/20 0708 01/16/20 0330 01/16/20 0330 01/17/20 0444 01/18/20 0400 01/18/20 1129 01/19/20 0617 01/19/20 1147 01/20/20 0616 01/21/20 0504  NA 122*   < > 125*   < > 129* 127* 127* 123* 127* 126* 129*  K 3.2*  --  3.0*   < > 3.2* 3.9 4.3 3.7 4.3 3.8 3.9  CL 90*  --  89*   < > 93* 94* 93* 91* 89* 93* 95*  CO2 25  --  26   < > 27 26 27 23 24 25 25   GLUCOSE 111*  --  119*   < > 112* 111* 119* 111* 112* 112* 106*  BUN <5*  --  7*   < > 6* 7* 8 8 9 18  25*  CREATININE 0.54*  --  0.59*   < > 0.56* 0.52* 0.71 0.58* 0.77 0.56* 0.59*  CALCIUM 7.8*  --  8.3*   < > 8.7* 8.8* 8.7* 8.5* 9.6  8.7* 8.7*  PHOS 3.2  --  3.3  --   --   --   --   --   --   --   --    < > = values in this interval not displayed.   CBC Recent Labs  Lab 01/15/20 0212 01/16/20 1402 01/17/20 0444  WBC 7.8 8.6 8.1  HGB 12.7* 12.7* 12.4*  HCT 36.3* 36.7* 35.2*  MCV 90.8 92.2 91.0  PLT 219 251 258    Medications:    . Chlorhexidine Gluconate Cloth  6 each Topical Daily  . folic acid  1 mg Oral Daily  . heparin  5,000 Units Subcutaneous Q8H  . multivitamin with minerals  1 tablet Oral Daily  . nicotine  21 mg Transdermal Daily  . pantoprazole  40 mg Oral BID  . thiamine  100 mg Oral Daily   Or  . thiamine  100 mg Intravenous Daily  . Urea  15 g Oral BID      01/18/20  Shannan Harper  MD [facility] 01/21/2020, 9:35 AM

## 2020-01-22 LAB — CBC
HCT: 37.2 % — ABNORMAL LOW (ref 39.0–52.0)
Hemoglobin: 12.5 g/dL — ABNORMAL LOW (ref 13.0–17.0)
MCH: 31.7 pg (ref 26.0–34.0)
MCHC: 33.6 g/dL (ref 30.0–36.0)
MCV: 94.4 fL (ref 80.0–100.0)
Platelets: 364 10*3/uL (ref 150–400)
RBC: 3.94 MIL/uL — ABNORMAL LOW (ref 4.22–5.81)
RDW: 12.5 % (ref 11.5–15.5)
WBC: 7.9 10*3/uL (ref 4.0–10.5)
nRBC: 0 % (ref 0.0–0.2)

## 2020-01-22 LAB — URINE CULTURE: Culture: >=100000 — AB

## 2020-01-22 LAB — BASIC METABOLIC PANEL
Anion gap: 8 (ref 5–15)
BUN: 25 mg/dL — ABNORMAL HIGH (ref 8–23)
CO2: 25 mmol/L (ref 22–32)
Calcium: 8.8 mg/dL — ABNORMAL LOW (ref 8.9–10.3)
Chloride: 95 mmol/L — ABNORMAL LOW (ref 98–111)
Creatinine, Ser: 0.61 mg/dL (ref 0.61–1.24)
GFR, Estimated: >60 mL/min (ref >60)
Glucose, Bld: 114 mg/dL — ABNORMAL HIGH (ref 70–99)
Potassium: 4.1 mmol/L (ref 3.5–5.1)
Sodium: 128 mmol/L — ABNORMAL LOW (ref 135–145)

## 2020-01-22 MED ORDER — UREA 15 G PO PACK: 15.0000 g | PACK | Freq: Two times a day (BID) | ORAL | 0 refills | Status: AC

## 2020-01-22 MED ORDER — CEFDINIR 300 MG PO CAPS: 300.0000 mg | ORAL_CAPSULE | Freq: Two times a day (BID) | ORAL | 0 refills | Status: AC

## 2020-01-22 NOTE — TOC Transition Note (Signed)
Transition of Care [facility]) - CM/SW Discharge Note   Patient Details  Name: Barry Fowler MRN: [id number] Date of Birth: 1956-01-17  [facility]) CM/SW Contact:  Leitha Bleak, RN Phone Number: 01/22/2020, 1:15 PM   Clinical Narrative:   Patient medically ready for discharge to [facility], Confirmed with admissions, number for report provided. RN to call report. TOC printed medial necessity and called EMS. Updated Sister Barry Fowler. She states he had at least one vaccine. Provided admission number to Barry Fowler for her to speak with admission and find out visitation policy and to update and provide proof of vaccine.     Final next level of care: Skilled Nursing Facility Barriers to Discharge: Barriers Resolved   Patient Goals and CMS Choice Patient states their goals for this hospitalization and ongoing recovery are:: to go to SNF, then return home. CMS Medicare.gov Compare Post Acute Care list provided to:: Patient Represenative (must comment) Choice offered to / list presented to : Sibling  Discharge Placement              Patient chooses bed at: [facility] Patient to be transferred to facility by: EMS Name of family member notified: Barry Fowler- sister Patient and family notified of of transfer: 01/22/20

## 2020-01-22 NOTE — Care Management Important Message (Signed)
Important Message  Patient Details  Name: Barry Fowler MRN: [id number] Date of Birth: [birth year]   Medicare Important Message Given:  Yes Corrie Dandy, RN was asked to deliver form to room)     Corey Harold 01/22/2020, 12:59 PM

## 2020-01-22 NOTE — Progress Notes (Signed)
[facility] Progress Note    Assessment/ Plan:   1. Hyponatremia, severe- Uosm 211, Sosm 225, Urine Na 15 consistent with poor solute intake and beer potomania.  Overall improved.    Goal serum Na would be 130 or greater maintained off IVFs.   Cont ure-na 15g BID for another 24h, I expect next check will be further improved.  If needed, can uptitrate this to 30g bid if needed.   He is clinically stable and out of risk for rapid correction.    OK for DC, f/u with PCP, if persists we can see as outpt  Cont Urea at time of discharge  Will sign off, call with any questions or concerns.  2. E coli UTI ceftriaxone per TRH 3. Acute metabolic encephalopathy- CT scan of head negative. resolved 4. Alcohol abuse- per primary, encouraged abstinence 5. HTN- no meds, monitor 6. Heme + stools per primary. Hgb stable.  7. Hypokalemia: replete prn. Currently stable   Subjective:    SNa 128 Good PO > 1L UOP No c/o this AM No N/V/confusion    Objective:   BP (!) 155/92 (BP Location: Left Arm)   Pulse 78   Temp 98.8 F (37.1 C) (Oral)   Resp 20   Ht 6' (1.829 m)   Wt 75 kg   SpO2 98%   BMI 22.42 kg/m   Intake/Output Summary (Last 24 hours) at 01/22/2020 0901 Last data filed at 01/22/2020 0500 Gross per 24 hour  Intake 1110.85 ml  Output 975 ml  Net 135.85 ml   Weight change:   Physical Exam: Gen: NAD, sitting up in chair CVS: RRR Resp: clear Abd: no tenderness Ext: no LE edema NEURO: AAO x 3, appears to have some delayed processing  Imaging: No results found.  Labs: BMET Recent Labs  Lab 01/16/20 0330 01/17/20 0444 01/18/20 0400 01/18/20 1129 01/19/20 0617 01/19/20 1147 01/20/20 0616 01/21/20 0504 01/22/20 0416  NA 125*   < > 127* 127* 123* 127* 126* 129* 128*  K 3.0*   < > 3.9 4.3 3.7 4.3 3.8 3.9 4.1  CL 89*   < > 94* 93* 91* 89* 93* 95* 95*  CO2 26   < > 26 27 23 24 25 25 25   GLUCOSE 119*   < > 111* 119* 111* 112* 112* 106* 114*   BUN 7*   < > 7* 8 8 9 18  25* 25*  CREATININE 0.59*   < > 0.52* 0.71 0.58* 0.77 0.56* 0.59* 0.61  CALCIUM 8.3*   < > 8.8* 8.7* 8.5* 9.6 8.7* 8.7* 8.8*  PHOS 3.3  --   --   --   --   --   --   --   --    < > = values in this interval not displayed.   CBC Recent Labs  Lab 01/16/20 1402 01/17/20 0444 01/22/20 0416  WBC 8.6 8.1 7.9  HGB 12.7* 12.4* 12.5*  HCT 36.7* 35.2* 37.2*  MCV 92.2 91.0 94.4  PLT 251 258 364    Medications:    . folic acid  1 mg Oral Daily  . heparin  5,000 Units Subcutaneous Q8H  . multivitamin with minerals  1 tablet Oral Daily  . nicotine  21 mg Transdermal Daily  . pantoprazole  40 mg Oral BID  . thiamine  100 mg Oral Daily   Or  . thiamine  100 mg Intravenous Daily  . Urea  15 g Oral BID  Arita Miss  MD [facility] 01/22/2020, 9:01 AM

## 2020-01-22 NOTE — Progress Notes (Signed)
Physical Therapy Treatment Patient Details Name: Barry Fowler MRN: [id number] DOB: [birth year] Today's Date: 01/22/2020    History of Present Illness 64 y.o. male, with PMH of HTN, hyperlipidemia, tobacco abuse, 1 pack/day for last 30 years, presents to ED with c/o couple weeks of generalized weakness, as well for last few days he had diminished appetite, and there was some questionable left-sided facial droop, nausea, 1 episode of dark bowel movement.    PT Comments    Pt requests to wash face so therapist assisted pt to ambulate to sink, close SUPV to complete hygiene. Pt tolerates ambulation in hallway with narrow BOS, slightly flexed trunk and unsteadiness noted requiring min A to steady and assist to safely maneuver RW with turns and around obstacles. Pt declines pain or SOB throughout session. Pt remains up in chair at EOS with all needs in reach.     Follow Up Recommendations  SNF;Supervision for mobility/OOB;Supervision/Assistance - 24 hour     Equipment Recommendations  Rolling walker with 5" wheels    Recommendations for Other Services       Precautions / Restrictions Precautions Precautions: Fall Restrictions Weight Bearing Restrictions: No    Mobility  Bed Mobility  General bed mobility comments: in chair upon arrival  Transfers Overall transfer level: Needs assistance Equipment used: Rolling walker (2 wheeled) Transfers: Sit to/from Stand Sit to Stand: Min guard    General transfer comment: min G for safety, BUE assisting to power up  Ambulation/Gait Ambulation/Gait assistance: Min assist Gait Distance (Feet): 150 Feet Assistive device: Rolling walker (2 wheeled) Gait Pattern/deviations: Step-through pattern;Decreased stride length;Narrow base of support Gait velocity: decreased   General Gait Details: slow, slightly unsteady gait pattern, maintains narrow BOS despite cues, assist with maneuvering RW with turns and safely around obstacles   Stairs              Wheelchair Mobility    Modified Rankin (Stroke Patients Only)       Balance Overall balance assessment: Needs assistance  Standing balance support: Bilateral upper extremity supported;During functional activity Standing balance-Leahy Scale: Fair       Cognition Arousal/Alertness: Awake/alert Behavior During Therapy: WFL for tasks assessed/performed Overall Cognitive Status: Within Functional Limits for tasks assessed         Exercises General Exercises - Lower Extremity Long Arc Quad: Seated;AROM;Strengthening;Both;15 reps    General Comments        Pertinent Vitals/Pain Pain Assessment: No/denies pain    Home Living                      Prior Function            PT Goals (current goals can now be found in the care plan section) Acute Rehab PT Goals Patient Stated Goal: return home with family to assist PT Goal Formulation: With patient Time For Goal Achievement: 01/28/20 Potential to Achieve Goals: Good Progress towards PT goals: Progressing toward goals    Frequency    Min 3X/week      PT Plan Current plan remains appropriate    Co-evaluation              AM-PAC PT "6 Clicks" Mobility   Outcome Measure  Help needed turning from your back to your side while in a flat bed without using bedrails?: A Little Help needed moving from lying on your back to sitting on the side of a flat bed without using bedrails?: A Little Help needed moving to and from  a bed to a chair (including a wheelchair)?: A Little Help needed standing up from a chair using your arms (e.g., wheelchair or bedside chair)?: A Little Help needed to walk in hospital room?: A Little Help needed climbing 3-5 steps with a railing? : A Lot 6 Click Score: 17    End of Session Equipment Utilized During Treatment: Gait belt Activity Tolerance: Patient tolerated treatment well Patient left: in chair;with call bell/phone within reach Nurse Communication:  Mobility status PT Visit Diagnosis: Unsteadiness on feet (R26.81);Other abnormalities of gait and mobility (R26.89);Muscle weakness (generalized) (M62.81)     Time: 1040-1100 PT Time Calculation (min) (ACUTE ONLY): 20 min  Charges:  $Gait Training: 8-22 mins                      Tori Alyra Patty PT, DPT 01/22/20, 12:44 PM [phone]

## 2020-01-23 ENCOUNTER — Encounter: Payer: Self-pay | Admitting: Gastroenterology

## 2020-03-03 ENCOUNTER — Ambulatory Visit: Payer: Medicare Other | Admitting: Gastroenterology

## 2020-03-03 ENCOUNTER — Encounter: Payer: Self-pay | Admitting: Gastroenterology

## 2020-06-08 ENCOUNTER — Telehealth: Payer: Self-pay | Admitting: *Deleted

## 2020-06-08 NOTE — Telephone Encounter (Signed)
Attempted to call patient to inform him that it is time to schedule his annual lung cancer screening CT scan. Was unable to leave a voicemail.

## 2020-06-09 ENCOUNTER — Telehealth: Payer: Self-pay | Admitting: *Deleted

## 2020-06-09 NOTE — Telephone Encounter (Signed)
Attempted to call patient for annual lung screening. No answer and no voicemail available. 

## 2020-06-24 ENCOUNTER — Telehealth: Payer: Self-pay | Admitting: *Deleted

## 2020-06-24 NOTE — Telephone Encounter (Signed)
Attempted to call patient for annual lung screening. No answer and no voicemail available. 

## 2020-06-26 ENCOUNTER — Telehealth: Payer: Self-pay

## 2020-06-26 NOTE — Telephone Encounter (Signed)
Attempted to call patient for annual lung screening. No answer and no voicemail available. 

## 2020-07-01 ENCOUNTER — Encounter: Payer: Self-pay | Admitting: *Deleted

## 2020-12-03 ENCOUNTER — Encounter (HOSPITAL_COMMUNITY): Payer: Self-pay

## 2020-12-03 ENCOUNTER — Inpatient Hospital Stay (HOSPITAL_COMMUNITY)
Admission: EM | Admit: 2020-12-03 | Discharge: 2020-12-10 | DRG: 641 | Disposition: A | Discharge status: Home / Self Care | Payer: Medicare (Managed Care) | Attending: Family Medicine | Admitting: Family Medicine

## 2020-12-03 ENCOUNTER — Emergency Department (HOSPITAL_COMMUNITY): Payer: Medicare (Managed Care)

## 2020-12-03 ENCOUNTER — Other Ambulatory Visit: Payer: Self-pay

## 2020-12-03 ENCOUNTER — Inpatient Hospital Stay (HOSPITAL_COMMUNITY): Payer: Medicare (Managed Care)

## 2020-12-03 DIAGNOSIS — W19XXXA Unspecified fall, initial encounter: Secondary | ICD-10-CM | POA: Diagnosis present

## 2020-12-03 DIAGNOSIS — Z7289 Other problems related to lifestyle: Secondary | ICD-10-CM

## 2020-12-03 DIAGNOSIS — H547 Unspecified visual loss: Secondary | ICD-10-CM | POA: Diagnosis present

## 2020-12-03 DIAGNOSIS — Z79899 Other long term (current) drug therapy: Secondary | ICD-10-CM

## 2020-12-03 DIAGNOSIS — F1010 Alcohol abuse, uncomplicated: Secondary | ICD-10-CM | POA: Diagnosis present

## 2020-12-03 DIAGNOSIS — Z789 Other specified health status: Secondary | ICD-10-CM | POA: Diagnosis present

## 2020-12-03 DIAGNOSIS — E871 Hypo-osmolality and hyponatremia: Principal | ICD-10-CM

## 2020-12-03 DIAGNOSIS — D6959 Other secondary thrombocytopenia: Secondary | ICD-10-CM | POA: Diagnosis present

## 2020-12-03 DIAGNOSIS — Z20822 Contact with and (suspected) exposure to covid-19: Secondary | ICD-10-CM | POA: Diagnosis present

## 2020-12-03 DIAGNOSIS — I10 Essential (primary) hypertension: Secondary | ICD-10-CM | POA: Diagnosis present

## 2020-12-03 DIAGNOSIS — Y92009 Unspecified place in unspecified non-institutional (private) residence as the place of occurrence of the external cause: Secondary | ICD-10-CM

## 2020-12-03 DIAGNOSIS — F17210 Nicotine dependence, cigarettes, uncomplicated: Secondary | ICD-10-CM | POA: Diagnosis present

## 2020-12-03 DIAGNOSIS — K760 Fatty (change of) liver, not elsewhere classified: Secondary | ICD-10-CM | POA: Diagnosis present

## 2020-12-03 DIAGNOSIS — E876 Hypokalemia: Secondary | ICD-10-CM

## 2020-12-03 DIAGNOSIS — Z8249 Family history of ischemic heart disease and other diseases of the circulatory system: Secondary | ICD-10-CM

## 2020-12-03 DIAGNOSIS — R7989 Other specified abnormal findings of blood chemistry: Secondary | ICD-10-CM | POA: Diagnosis present

## 2020-12-03 DIAGNOSIS — R296 Repeated falls: Secondary | ICD-10-CM

## 2020-12-03 DIAGNOSIS — E872 Acidosis: Secondary | ICD-10-CM | POA: Diagnosis present

## 2020-12-03 DIAGNOSIS — R945 Abnormal results of liver function studies: Secondary | ICD-10-CM | POA: Diagnosis not present

## 2020-12-03 DIAGNOSIS — K709 Alcoholic liver disease, unspecified: Secondary | ICD-10-CM | POA: Diagnosis present

## 2020-12-03 DIAGNOSIS — Z87891 Personal history of nicotine dependence: Secondary | ICD-10-CM | POA: Diagnosis not present

## 2020-12-03 DIAGNOSIS — F1090 Alcohol use, unspecified, uncomplicated: Secondary | ICD-10-CM

## 2020-12-03 LAB — URINALYSIS, ROUTINE W REFLEX MICROSCOPIC
Bilirubin Urine: NEGATIVE
Glucose, UA: NEGATIVE mg/dL
Hgb urine dipstick: NEGATIVE
Ketones, ur: 40 mg/dL — AB
Leukocytes,Ua: NEGATIVE
Nitrite: NEGATIVE
Protein, ur: NEGATIVE mg/dL
Specific Gravity, Urine: <1.005 — ABNORMAL LOW (ref 1.005–1.030)
pH: 6 (ref 5.0–8.0)

## 2020-12-03 LAB — CBC WITH DIFFERENTIAL/PLATELET
Abs Immature Granulocytes: 0.03 10*3/uL (ref 0.00–0.07)
Basophils Absolute: 0 10*3/uL (ref 0.0–0.1)
Basophils Relative: 1 %
Eosinophils Absolute: 0 10*3/uL (ref 0.0–0.5)
Eosinophils Relative: 1 %
HCT: 37.8 % — ABNORMAL LOW (ref 39.0–52.0)
Hemoglobin: 14 g/dL (ref 13.0–17.0)
Immature Granulocytes: 1 %
Lymphocytes Relative: 22 %
Lymphs Abs: 1.2 10*3/uL (ref 0.7–4.0)
MCH: 32.5 pg (ref 26.0–34.0)
MCHC: 37 g/dL — ABNORMAL HIGH (ref 30.0–36.0)
MCV: 87.7 fL (ref 80.0–100.0)
Monocytes Absolute: 0.6 10*3/uL (ref 0.1–1.0)
Monocytes Relative: 12 %
Neutro Abs: 3.4 10*3/uL (ref 1.7–7.7)
Neutrophils Relative %: 63 %
Platelets: 78 10*3/uL — ABNORMAL LOW (ref 150–400)
RBC: 4.31 MIL/uL (ref 4.22–5.81)
RDW: 12.9 % (ref 11.5–15.5)
WBC: 5.3 10*3/uL (ref 4.0–10.5)
nRBC: 0 % (ref 0.0–0.2)

## 2020-12-03 LAB — COMPREHENSIVE METABOLIC PANEL
ALT: 76 U/L — ABNORMAL HIGH (ref 0–44)
AST: 121 U/L — ABNORMAL HIGH (ref 15–41)
Albumin: 3.3 g/dL — ABNORMAL LOW (ref 3.5–5.0)
Alkaline Phosphatase: 81 U/L (ref 38–126)
Anion gap: 13 (ref 5–15)
BUN: <5 mg/dL — ABNORMAL LOW (ref 8–23)
CO2: 22 mmol/L (ref 22–32)
Calcium: 7.8 mg/dL — ABNORMAL LOW (ref 8.9–10.3)
Chloride: 76 mmol/L — ABNORMAL LOW (ref 98–111)
Creatinine, Ser: 0.38 mg/dL — ABNORMAL LOW (ref 0.61–1.24)
GFR, Estimated: >60 mL/min (ref >60)
Glucose, Bld: 88 mg/dL (ref 70–99)
Potassium: 3.1 mmol/L — ABNORMAL LOW (ref 3.5–5.1)
Sodium: 111 mmol/L — CL (ref 135–145)
Total Bilirubin: 1.2 mg/dL (ref 0.3–1.2)
Total Protein: 6.7 g/dL (ref 6.5–8.1)

## 2020-12-03 LAB — BASIC METABOLIC PANEL
Anion gap: 11 (ref 5–15)
BUN: <5 mg/dL — ABNORMAL LOW (ref 8–23)
CO2: 24 mmol/L (ref 22–32)
Calcium: 7.9 mg/dL — ABNORMAL LOW (ref 8.9–10.3)
Chloride: 82 mmol/L — ABNORMAL LOW (ref 98–111)
Creatinine, Ser: 0.54 mg/dL — ABNORMAL LOW (ref 0.61–1.24)
GFR, Estimated: >60 mL/min (ref >60)
Glucose, Bld: 89 mg/dL (ref 70–99)
Potassium: 3.6 mmol/L (ref 3.5–5.1)
Sodium: 117 mmol/L — CL (ref 135–145)

## 2020-12-03 LAB — PHOSPHORUS: Phosphorus: 2.4 mg/dL — ABNORMAL LOW (ref 2.5–4.6)

## 2020-12-03 LAB — BRAIN NATRIURETIC PEPTIDE: B Natriuretic Peptide: 25 pg/mL (ref 0.0–100.0)

## 2020-12-03 LAB — LACTIC ACID, PLASMA
Lactic Acid, Venous: 2.3 mmol/L (ref 0.5–1.9)
Lactic Acid, Venous: 2.2 mmol/L (ref 0.5–1.9)

## 2020-12-03 LAB — RESP PANEL BY RT-PCR (FLU A&B, COVID) ARPGX2
Influenza A by PCR: NEGATIVE
Influenza B by PCR: NEGATIVE
SARS Coronavirus 2 by RT PCR: NEGATIVE

## 2020-12-03 LAB — CREATININE, URINE, RANDOM: Creatinine, Urine: 22.58 mg/dL

## 2020-12-03 LAB — TROPONIN I (HIGH SENSITIVITY)
Troponin I (High Sensitivity): 7 ng/L (ref <18)
Troponin I (High Sensitivity): 7 ng/L (ref <18)

## 2020-12-03 LAB — LIPASE, BLOOD: Lipase: 25 U/L (ref 11–51)

## 2020-12-03 LAB — MAGNESIUM: Magnesium: 1.6 mg/dL — ABNORMAL LOW (ref 1.7–2.4)

## 2020-12-03 LAB — OSMOLALITY, URINE: Osmolality, Ur: 134 mOsm/kg — ABNORMAL LOW (ref 300–900)

## 2020-12-03 LAB — ETHANOL: Alcohol, Ethyl (B): 21 mg/dL — ABNORMAL HIGH (ref <10)

## 2020-12-03 LAB — PROTIME-INR
INR: 0.9 (ref 0.8–1.2)
Prothrombin Time: 12.1 seconds (ref 11.4–15.2)

## 2020-12-03 LAB — SODIUM, URINE, RANDOM: Sodium, Ur: 16 mmol/L

## 2020-12-03 LAB — CBG MONITORING, ED: Glucose-Capillary: 93 mg/dL (ref 70–99)

## 2020-12-03 LAB — TSH: TSH: 0.502 u[IU]/mL (ref 0.350–4.500)

## 2020-12-03 MED ORDER — ACETAMINOPHEN 650 MG RE SUPP: 650.0000 mg | Freq: Four times a day (QID) | RECTAL | Status: DC | PRN

## 2020-12-03 MED ORDER — SODIUM CHLORIDE 0.9 % IV SOLN: INTRAVENOUS | Status: DC

## 2020-12-03 MED ORDER — THIAMINE HCL 100 MG/ML IJ SOLN: 100.0000 mg | Freq: Every day | INTRAMUSCULAR | Status: DC

## 2020-12-03 MED ORDER — POTASSIUM CHLORIDE CRYS ER 20 MEQ PO TBCR
20.0000 meq | EXTENDED_RELEASE_TABLET | Freq: Once | ORAL | Status: AC
  Administered 2020-12-03: 20 meq via ORAL
  Filled 2020-12-03: qty 1

## 2020-12-03 MED ORDER — ONDANSETRON HCL 4 MG PO TABS: 4.0000 mg | ORAL_TABLET | Freq: Four times a day (QID) | ORAL | Status: DC | PRN

## 2020-12-03 MED ORDER — MAGNESIUM SULFATE 2 GM/50ML IV SOLN
2.0000 g | Freq: Once | INTRAVENOUS | Status: AC
  Administered 2020-12-03: 2 g via INTRAVENOUS
  Filled 2020-12-03: qty 50

## 2020-12-03 MED ORDER — LORAZEPAM 2 MG/ML IJ SOLN: 1.0000 mg | INTRAMUSCULAR | Status: AC | PRN

## 2020-12-03 MED ORDER — POLYETHYLENE GLYCOL 3350 17 G PO PACK: 17.0000 g | PACK | Freq: Every day | ORAL | Status: DC | PRN

## 2020-12-03 MED ORDER — ADULT MULTIVITAMIN W/MINERALS CH
1.0000 | ORAL_TABLET | Freq: Every day | ORAL | Status: DC
  Administered 2020-12-04 – 2020-12-10 (×7): 1 via ORAL
  Filled 2020-12-03 (×7): qty 1

## 2020-12-03 MED ORDER — ONDANSETRON HCL 4 MG/2ML IJ SOLN: 4.0000 mg | Freq: Four times a day (QID) | INTRAMUSCULAR | Status: DC | PRN

## 2020-12-03 MED ORDER — ACETAMINOPHEN 325 MG PO TABS: 650.0000 mg | ORAL_TABLET | Freq: Four times a day (QID) | ORAL | Status: DC | PRN

## 2020-12-03 MED ORDER — ERYTHROMYCIN 5 MG/GM OP OINT
TOPICAL_OINTMENT | Freq: Three times a day (TID) | OPHTHALMIC | Status: DC
  Administered 2020-12-05 – 2020-12-07 (×2): 1 via OPHTHALMIC
  Filled 2020-12-03: qty 3.5

## 2020-12-03 MED ORDER — SODIUM CHLORIDE 0.9 % IV BOLUS: 1000.0000 mL | Freq: Once | INTRAVENOUS | Status: DC

## 2020-12-03 MED ORDER — LORAZEPAM 1 MG PO TABS: 1.0000 mg | ORAL_TABLET | ORAL | Status: AC | PRN

## 2020-12-03 MED ORDER — THIAMINE HCL 100 MG PO TABS
100.0000 mg | ORAL_TABLET | Freq: Every day | ORAL | Status: DC
  Administered 2020-12-04 – 2020-12-10 (×7): 100 mg via ORAL
  Filled 2020-12-03 (×7): qty 1

## 2020-12-03 MED ORDER — SODIUM CHLORIDE 3 % IV SOLN
INTRAVENOUS | Status: DC
  Filled 2020-12-03 (×2): qty 500

## 2020-12-03 MED ORDER — SODIUM CHLORIDE 0.9 % IV BOLUS
1000.0000 mL | Freq: Once | INTRAVENOUS | Status: AC
  Administered 2020-12-03: 1000 mL via INTRAVENOUS

## 2020-12-03 MED ORDER — POTASSIUM CHLORIDE CRYS ER 20 MEQ PO TBCR
40.0000 meq | EXTENDED_RELEASE_TABLET | Freq: Once | ORAL | Status: AC
  Administered 2020-12-03: 40 meq via ORAL
  Filled 2020-12-03: qty 2

## 2020-12-03 MED ORDER — FOLIC ACID 1 MG PO TABS
1.0000 mg | ORAL_TABLET | Freq: Every day | ORAL | Status: DC
  Administered 2020-12-04 – 2020-12-10 (×7): 1 mg via ORAL
  Filled 2020-12-03 (×7): qty 1

## 2020-12-03 NOTE — ED Triage Notes (Signed)
Pt brought to ED via [facility] for generalized weakness x several days. Pt states he has had problems with his sodium before. Pt fell and hit his head the other day. Pt with bruising on the right eye.

## 2020-12-03 NOTE — H&P (Addendum)
History and Physical    Barry Fowler EXN:[id number] DOB: 04-28-1955 DOA: 12/03/2020  PCP: Patient, No Pcp Per (Inactive)   Patient coming from: Home  I have personally briefly reviewed patient's old medical records in [facility]  Chief Complaint: Weakness  HPI: Barry Fowler is a 65 y.o. male with medical history significant for alcohol abuse, hypertension, hyponatremia. Patient was brought to the ED via EMS for reports of generalized weakness.  Patient's family called EMS.  Weakness has been ongoing for several days.  He reports good oral intake.  He denies nausea, no vomiting , no significant diarrhea.  No seizures.  He denies confusion.  Reports he fell a few days ago and reports that he hit his head.   Patient drinks at about 12 pack of beer every day.  His last drink was yesterday afternoon at about 4 PM.  ED Course: Stable vitals.  Sodium 111.  Potassium 3.1.  Urine sodium 16.  Mild elevation in liver enzymes AST 121, ALT 76.  Lactic acidosis of 2.3.  UA not suggestive of infection, positive for ketones.  Chest x-ray unremarkable.  Normal saline 100 cc/h started.  Hospitalist to admit.  Review of Systems: As per HPI all other systems reviewed and negative.  Past Medical History:  Diagnosis Date   Hypertension     Past Surgical History:  Procedure Laterality Date   EYE SURGERY       reports that he has been smoking cigarettes. He has a 42.00 pack-year smoking history. He has never used smokeless tobacco. He reports current alcohol use. No history on file for drug use.  No Known Allergies  Family history of hypertension.  Prior to Admission medications   Medication Sig Start Date End Date Taking? Authorizing Provider  acetaminophen (TYLENOL) 500 MG tablet Take 500 mg by mouth every 6 (six) hours as needed.    [provider]  losartan (COZAAR) 25 MG tablet Take 25 mg by mouth daily. 12/05/19   [provider]  Multiple Vitamin (MULTIVITAMIN WITH MINERALS)  TABS tablet Take 1 tablet by mouth daily. 01/19/20   Sherryll Burger, Pratik D, DO  pantoprazole (PROTONIX) 40 MG tablet Take 1 tablet (40 mg total) by mouth 2 (two) times daily. 01/18/20 01/17/21  Sherryll Burger, Pratik D, DO  thiamine 100 MG tablet Take 1 tablet (100 mg total) by mouth daily. 01/19/20   Maurilio Lovely D, DO    Physical Exam: Vitals:   12/03/20 1601 12/03/20 1630 12/03/20 1730 12/03/20 1800  BP:  122/82 122/87 126/72  Pulse:  86 85 95  Resp:  18 18   Temp:      SpO2:  98% 95% 98%  Weight: 72.6 kg     Height: 6' (1.829 m)       Constitutional:  calm, comfortable, appears unkempt Vitals:   12/03/20 1601 12/03/20 1630 12/03/20 1730 12/03/20 1800  BP:  122/82 122/87 126/72  Pulse:  86 85 95  Resp:  18 18   Temp:      SpO2:  98% 95% 98%  Weight: 72.6 kg     Height: 6' (1.829 m)      Eyes: Mild left eye discharge on lids and lashes, PERRL,  conjunctivae normal ENMT: Mucous membranes are moist. Neck: normal, supple, no masses, no thyromegaly Respiratory: clear to auscultation bilaterally, no wheezing, no crackles. Normal respiratory effort. No accessory muscle use.  Cardiovascular: Regular rate and rhythm, no murmurs / rubs / gallops. No extremity edema. 2+ pedal pulses.  Abdomen: no tenderness, no masses palpated. No hepatosplenomegaly. Bowel sounds positive.  Musculoskeletal: no clubbing / cyanosis. No joint deformity upper and lower extremities. Good ROM, no contractures. Normal muscle tone.  Skin: no rashes, lesions, ulcers. No induration Neurologic: Moving all extremities against gravity, no apparent cranial nerve abnormality Psychiatric: Normal judgment and insight. Alert and oriented x 3.  Flat affect  Labs on Admission: I have personally reviewed following labs and imaging studies  CBC: Recent Labs  Lab 12/03/20 1630  WBC 5.3  NEUTROABS 3.4  HGB 14.0  HCT 37.8*  MCV 87.7  PLT 78*   Basic Metabolic Panel: Recent Labs  Lab 12/03/20 1630  NA 111*  K 3.1*  CL 76*   CO2 22  GLUCOSE 88  BUN <5*  CREATININE 0.38*  CALCIUM 7.8*   Liver Function Tests: Recent Labs  Lab 12/03/20 1630  AST 121*  ALT 76*  ALKPHOS 81  BILITOT 1.2  PROT 6.7  ALBUMIN 3.3*   Recent Labs  Lab 12/03/20 1630  LIPASE 25   No results for input(s): AMMONIA in the last 168 hours. Coagulation Profile: Recent Labs  Lab 12/03/20 1630  INR 0.9   Urine analysis:    Component Value Date/Time   COLORURINE YELLOW 12/03/2020 1715   APPEARANCEUR CLEAR 12/03/2020 1715   APPEARANCEUR Clear 01/10/2012 1248   LABSPEC <1.005 (L) 12/03/2020 1715   LABSPEC 1.008 01/10/2012 1248   PHURINE 6.0 12/03/2020 1715   GLUCOSEU NEGATIVE 12/03/2020 1715   GLUCOSEU Negative 01/10/2012 1248   HGBUR NEGATIVE 12/03/2020 1715   BILIRUBINUR NEGATIVE 12/03/2020 1715   BILIRUBINUR Negative 01/10/2012 1248   KETONESUR 40 (A) 12/03/2020 1715   PROTEINUR NEGATIVE 12/03/2020 1715   NITRITE NEGATIVE 12/03/2020 1715   LEUKOCYTESUR NEGATIVE 12/03/2020 1715   LEUKOCYTESUR Negative 01/10/2012 1248    Radiological Exams on Admission: DG Chest Port 1 View  Result Date: 12/03/2020 CLINICAL DATA:  Weakness, fell several days ago, history hypertension EXAM: PORTABLE CHEST 1 VIEW COMPARISON:  Portable exam 1612 hours compared to 01/11/2020 FINDINGS: Normal heart size, mediastinal contours, and pulmonary vascularity. Atherosclerotic calcification aorta. Emphysematous and bronchitic changes question COPD. Lungs clear. No pulmonary infiltrate, pleural effusion, or pneumothorax. Osseous demineralization. 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 Aortic Atherosclerosis (ICD10-I70.0) and Emphysema (ICD10-J43.9). Electronically Signed   By: Ulyses Southward M.D.   On: 12/03/2020 16:40    EKG: Independently reviewed.   Assessment/Plan Principal Problem:   Hyponatremia Active Problems:   Alcohol abuse   Abnormal LFTs (liver function tests)   Hypokalemia   Hyponatremia-sodium 111.  Likely secondary  to beer potomania.  History of same- 12/2019, patient admitted for hyponatremia sodium 106 and patient responded nicely to just normal saline infusion, nephrology was consulted, hyponatremia deemed secondary to beer potomania and poor solute intake., he was also managed with Ure-Na. -Give 1 L bolus, continue normal saline at 100 cc/h -Monitor sodium closely, if no improvement consider starting 3% hypertonic saline -Follow-up serum osmolality, urine osmolality -Check TSH -Goal correction 8 Mq in 24 hours - Consider nephrology evaluation in the morning - addendum- Na 111 > 117, N/s held for now, enough correction for tonight, day team can resume IVF in a.m.  Fall, Alcohol abuse-drinks about 12 pack daily.  Last drink was yesterday afternoon at about 4  - 5 PM.  High risk for withdrawal. -CIWA protocol PRN - Thiamine folate multivitamin -Check magnesium, phosphorus -Head CT unremarkable for acute abnormality  Thrombocytopenia-platelets 78.  Baseline 200- 300 likely due to alcohol  abuse.  Hypokalemia, hypomagnesemia-potassium 3.1.  Magnesium 1.6 -Replete  Abnormal liver enzymes-AST 121, ALT 76, ALP 81.  Total bilirubin 1.2.  Pattern consistent with alcoholic liver disease.  Likely due to alcohol abuse.  12/2019- Abd ultrasound showed steatosis, and acute hepatitis panel nonreactive.    Lactic acidosis of 2.3.  Likely from dehydration, alcohol abuse.  No suggestion of infectious etiology at this time  DVT prophylaxis: Scds Code Status: Full code Family Communication: None at bedside Disposition Plan: > 2 days Consults called: None Admission status: Inpt, tele I certify that at the point of admission it is my clinical judgment that the patient will require inpatient hospital care spanning beyond 2 midnights from the point of admission due to high intensity of service, high risk for further deterioration and high frequency of surveillance required.    Onnie Boer MD [facility]  12/03/2020, 8:46 PM

## 2020-12-03 NOTE — ED Provider Notes (Signed)
[facility] Provider Note   CSN: [id number] Arrival date & time: 12/03/20  1542     History Chief Complaint  Patient presents with   Weakness    Barry CristalRicky Fowler is a 65 y.o. male.  He is brought in by EMS from home for evaluation of generalized weakness.  He said he has been weak for over a year.  Recent fall striking his face.  Mild shortness of breath cough nonproductive.  Denies any headache.  Has decreased vision in left eye chronic.  Denies sore throat chest pain abdominal pain nausea vomiting diarrhea constipation or urinary symptoms.  No focal weakness or numbness.  Having increased difficulty ambulating.  States his appetite's been good.  Denies any fevers or chills.  Drinks alcohol daily and smokes.  Denies any street drugs.  The history is provided by the patient and the EMS personnel.  Weakness Severity:  Moderate Onset quality:  Gradual Duration: 1 year. Progression:  Waxing and waning Context: alcohol use   Relieved by:  Nothing Worsened by:  Activity Ineffective treatments:  None tried Associated symptoms: cough, difficulty walking, falls and shortness of breath   Associated symptoms: no abdominal pain, no aphasia, no chest pain, no diarrhea, no dysuria, no fever, no foul-smelling urine, no headaches, no nausea and no vomiting       Past Medical History:  Diagnosis Date   Hypertension     Patient Active Problem List   Diagnosis Date Noted   Abnormal LFTs (liver function tests)    Melena    Hyponatremia 01/11/2020   Alcohol abuse 01/11/2020    Past Surgical History:  Procedure Laterality Date   EYE SURGERY         No family history on file.  Social History   Tobacco Use   Smoking status: Every Day    Packs/day: 1.00    Years: 42.00    Pack years: 42.00    Types: Cigarettes   Smokeless tobacco: Never  Substance Use Topics   Alcohol use: Yes    Comment: beer daily    Home Medications Prior to Admission medications    Medication Sig Start Date End Date Taking? Authorizing Provider  acetaminophen (TYLENOL) 500 MG tablet Take 500 mg by mouth every 6 (six) hours as needed.    [provider]  losartan (COZAAR) 25 MG tablet Take 25 mg by mouth daily. 12/05/19   [provider]  Multiple Vitamin (MULTIVITAMIN WITH MINERALS) TABS tablet Take 1 tablet by mouth daily. 01/19/20   Sherryll BurgerShah, Pratik D, DO  pantoprazole (PROTONIX) 40 MG tablet Take 1 tablet (40 mg total) by mouth 2 (two) times daily. 01/18/20 01/17/21  Sherryll BurgerShah, Pratik D, DO  thiamine 100 MG tablet Take 1 tablet (100 mg total) by mouth daily. 01/19/20   Maurilio LovelyShah, Pratik D, DO    Allergies    Patient has no known allergies.  Review of Systems   Review of Systems  Constitutional:  Negative for fever.  HENT:  Negative for sore throat.   Eyes:  Positive for visual disturbance (blind left eye - chronic).  Respiratory:  Positive for cough and shortness of breath.   Cardiovascular:  Negative for chest pain.  Gastrointestinal:  Negative for abdominal pain, diarrhea, nausea and vomiting.  Genitourinary:  Negative for dysuria.  Musculoskeletal:  Positive for falls and gait problem.  Skin:  Negative for rash.  Neurological:  Positive for weakness. Negative for headaches.   Physical Exam Updated Vital Signs BP 126/82  Pulse 92   Ht 6' (1.829 m)   Wt 72.6 kg   SpO2 98%   BMI 21.70 kg/m   Physical Exam Vitals and nursing note reviewed.  Constitutional:      Appearance: Normal appearance. He is well-developed.  HENT:     Head: Normocephalic.     Comments: He has some bruising lateral to his right eye Eyes:     Conjunctiva/sclera: Conjunctivae normal.  Cardiovascular:     Rate and Rhythm: Normal rate and regular rhythm.     Heart sounds: No murmur heard. Pulmonary:     Effort: Pulmonary effort is normal. No respiratory distress.     Breath sounds: Normal breath sounds.  Abdominal:     Palpations: Abdomen is soft.     Tenderness: There  is no abdominal tenderness. There is no guarding or rebound.  Musculoskeletal:        General: No deformity or signs of injury. Normal range of motion.     Cervical back: Neck supple.  Skin:    General: Skin is warm and dry.  Neurological:     General: No focal deficit present.     Mental Status: He is alert.     Cranial Nerves: No cranial nerve deficit.     Sensory: No sensory deficit.     Motor: No weakness.    ED Results / Procedures / Treatments   Labs (all labs ordered are listed, but only abnormal results are displayed) Labs Reviewed  COMPREHENSIVE METABOLIC PANEL - Abnormal; Notable for the following components:      Result Value   Sodium 111 (*)    Potassium 3.1 (*)    Chloride 76 (*)    BUN <5 (*)    Creatinine, Ser 0.38 (*)    Calcium 7.8 (*)    Albumin 3.3 (*)    AST 121 (*)    ALT 76 (*)    All other components within normal limits  LACTIC ACID, PLASMA - Abnormal; Notable for the following components:   Lactic Acid, Venous 2.3 (*)    All other components within normal limits  LACTIC ACID, PLASMA - Abnormal; Notable for the following components:   Lactic Acid, Venous 2.2 (*)    All other components within normal limits  CBC WITH DIFFERENTIAL/PLATELET - Abnormal; Notable for the following components:   HCT 37.8 (*)    MCHC 37.0 (*)    Platelets 78 (*)    All other components within normal limits  URINALYSIS, ROUTINE W REFLEX MICROSCOPIC - Abnormal; Notable for the following components:   Specific Gravity, Urine <1.005 (*)    Ketones, ur 40 (*)    All other components within normal limits  MAGNESIUM - Abnormal; Notable for the following components:   Magnesium 1.6 (*)    All other components within normal limits  PHOSPHORUS - Abnormal; Notable for the following components:   Phosphorus 2.4 (*)    All other components within normal limits  OSMOLALITY, URINE - Abnormal; Notable for the following components:   Osmolality, Ur 134 (*)    All other components  within normal limits  ETHANOL - Abnormal; Notable for the following components:   Alcohol, Ethyl (B) 21 (*)    All other components within normal limits  BASIC METABOLIC PANEL - Abnormal; Notable for the following components:   Sodium 117 (*)    Chloride 82 (*)    BUN <5 (*)    Creatinine, Ser 0.54 (*)    Calcium 7.9 (*)  All other components within normal limits  CBC - Abnormal; Notable for the following components:   HCT 38.0 (*)    MCHC 36.6 (*)    Platelets 82 (*)    All other components within normal limits  BASIC METABOLIC PANEL - Abnormal; Notable for the following components:   Sodium 119 (*)    Chloride 88 (*)    Glucose, Bld 101 (*)    BUN 5 (*)    Creatinine, Ser 0.44 (*)    Calcium 7.8 (*)    All other components within normal limits  BASIC METABOLIC PANEL - Abnormal; Notable for the following components:   Sodium 122 (*)    Chloride 87 (*)    BUN 5 (*)    Creatinine, Ser 0.56 (*)    Calcium 8.4 (*)    All other components within normal limits  RESP PANEL BY RT-PCR (FLU A&B, COVID) ARPGX2  MRSA NEXT GEN BY PCR, NASAL  LIPASE, BLOOD  BRAIN NATRIURETIC PEPTIDE  PROTIME-INR  SODIUM, URINE, RANDOM  CREATININE, URINE, RANDOM  TSH  OSMOLALITY  BASIC METABOLIC PANEL  BASIC METABOLIC PANEL  CBG MONITORING, ED  TROPONIN I (HIGH SENSITIVITY)  TROPONIN I (HIGH SENSITIVITY)    EKG EKG Interpretation  Date/Time:  Wednesday December 03 2020 16:33:55 EDT Ventricular Rate:  82 PR Interval:  191 QRS Duration: 97 QT Interval:  374 QTC Calculation: 437 R Axis:   55 Text Interpretation: Sinus rhythm No significant change since prior 10/21 Confirmed by Meridee Score [phone]) on 12/03/2020 5:11:05 PM  Radiology CT HEAD WO CONTRAST ( )  Result Date: 12/03/2020 CLINICAL DATA:  Fall, hit head EXAM: CT HEAD WITHOUT CONTRAST TECHNIQUE: Contiguous axial images were obtained from the base of the skull through the vertex without intravenous contrast. COMPARISON:   01/11/2020 FINDINGS: Brain: There is atrophy and chronic small vessel disease changes. No acute intracranial abnormality. Specifically, no hemorrhage, hydrocephalus, mass lesion, acute infarction, or significant intracranial injury. Vascular: No hyperdense vessel or unexpected calcification. Skull: No acute calvarial abnormality. Sinuses/Orbits: Visualized paranasal sinuses and mastoids clear. Orbital soft tissues unremarkable. Other: 90 IMPRESSION: Atrophy, chronic microvascular disease. No acute intracranial abnormality. Electronically Signed   By: Charlett Nose M.D.   On: 12/03/2020 19:51   DG Chest Port 1 View  Result Date: 12/03/2020 CLINICAL DATA:  Weakness, fell several days ago, history hypertension EXAM: PORTABLE CHEST 1 VIEW COMPARISON:  Portable exam 1612 hours compared to 01/11/2020 FINDINGS: Normal heart size, mediastinal contours, and pulmonary vascularity. Atherosclerotic calcification aorta. Emphysematous and bronchitic changes question COPD. Lungs clear. No pulmonary infiltrate, pleural effusion, or pneumothorax. Osseous demineralization. 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 Aortic Atherosclerosis (ICD10-I70.0) and Emphysema (ICD10-J43.9). Electronically Signed   By: Ulyses Southward M.D.   On: 12/03/2020 16:40    Procedures .Critical Care Performed by: Terrilee Files, MD Authorized by: Terrilee Files, MD   Critical care provider statement:    Critical care time (minutes):  45   Critical care time was exclusive of:  Separately billable procedures and treating other patients   Critical care was necessary to treat or prevent imminent or life-threatening deterioration of the following conditions:  Metabolic crisis   Critical care was time spent personally by me on the following activities:  Discussions with consultants, evaluation of patient's response to treatment, examination of patient, ordering and performing treatments and interventions, ordering and review of  laboratory studies, ordering and review of radiographic studies, pulse oximetry, re-evaluation of patient's condition, obtaining history from patient or  surrogate, review of old charts and development of treatment plan with patient or surrogate   Medications Ordered in ED Medications  LORazepam (ATIVAN) tablet 1-4 mg (has no administration in time range)    Or  LORazepam (ATIVAN) injection 1-4 mg (has no administration in time range)  thiamine tablet 100 mg (100 mg Oral Given 12/04/20 0935)    Or  thiamine (B-1) injection 100 mg ( Intravenous See Alternative 12/04/20 0935)  folic acid (FOLVITE) tablet 1 mg (1 mg Oral Given 12/04/20 0935)  multivitamin with minerals tablet 1 tablet (1 tablet Oral Given 12/04/20 0934)  erythromycin ophthalmic ointment ( Left Eye Given 12/04/20 1610)  acetaminophen (TYLENOL) tablet 650 mg (has no administration in time range)    Or  acetaminophen (TYLENOL) suppository 650 mg (has no administration in time range)  ondansetron (ZOFRAN) tablet 4 mg (has no administration in time range)    Or  ondansetron (ZOFRAN) injection 4 mg (has no administration in time range)  polyethylene glycol (MIRALAX / GLYCOLAX) packet 17 g (has no administration in time range)  Chlorhexidine Gluconate Cloth 2 % PADS 6 each (6 each Topical Given 12/04/20 0935)  potassium chloride SA (KLOR-CON) CR tablet 20 mEq (20 mEq Oral Given 12/03/20 1818)  sodium chloride 0.9 % bolus 1,000 mL (0 mLs Intravenous Stopped 12/03/20 2003)  potassium chloride SA (KLOR-CON) CR tablet 40 mEq (40 mEq Oral Given 12/03/20 2126)  magnesium sulfate IVPB 2 g 50 mL (0 g Intravenous Stopped 12/03/20 2155)    ED Course  I have reviewed the triage vital signs and the nursing notes.  Pertinent labs & imaging results that were available during my care of the patient were reviewed by me and considered in my medical decision making (see chart for details).  Clinical Course as of 12/04/20 0935  Wed Dec 03, 2020  1801 Patient  lactate elevated although not sure this reflects an infectious source.  Sodium critically low at 111.  Low potassium.  Patient clinically does not look dehydrated especially. [MB]  1901 Discussed with [facility] hospitalist Dr. Mariea Clonts who will evaluate the patient for admission. [MB]    Clinical Course User Index [MB] Terrilee Files, MD   MDM Rules/Calculators/A&P                          This patient complains of generalized weakness falls; this involves an extensive number of treatment Options and is a complaint that carries with it a high risk of complications and Morbidity. The differential includes dehydration, metabolic derangement, anemia, arrhythmia, sepsis, stroke  I ordered, reviewed and interpreted labs, which included CBC with normal white count, hemoglobin similar to priors, chemistries with markedly low sodium low chloride, normal renal function, lactic acid elevated will need to be trended, alcohol mildly elevated, urinalysis without clear signs of infection I ordered medication IV fluids I ordered imaging studies which included chest x-ray and I independently    visualized and interpreted imaging which showed COPD no acute findings Previous records obtained and reviewed in epic, was admitted about 4 months ago for similar presentation and found to be hyponatremic. I consulted [facility] hospitalist Dr. Mariea Clonts I and discussed lab and imaging findings  Critical Interventions: Initiation of IV fluids for patient's hyponatremia  After the interventions stated above, I reevaluated the patient and found patient still to be weak although nontoxic-appearing.  Will need admission to the hospital for further management.  Barry Fowler was evaluated in Emergency Department on  12/03/2020 for the symptoms described in the history of present illness. He was evaluated in the context of the global COVID-19 pandemic, which necessitated consideration that the patient might be at risk for infection with  the SARS-CoV-2 virus that causes COVID-19. Institutional protocols and algorithms that pertain to the evaluation of patients at risk for COVID-19 are in a state of rapid change based on information released by regulatory bodies including the CDC and federal and state organizations. These policies and algorithms were followed during the patient's care in the ED.   Final Clinical Impression(s) / ED Diagnoses Final diagnoses:  None    Rx / DC Orders ED Discharge Orders     None        Terrilee Files, MD 12/04/20 [phone]

## 2020-12-04 DIAGNOSIS — E871 Hypo-osmolality and hyponatremia: Secondary | ICD-10-CM | POA: Diagnosis not present

## 2020-12-04 LAB — BASIC METABOLIC PANEL
Anion gap: 9 (ref 5–15)
Anion gap: 12 (ref 5–15)
Anion gap: 13 (ref 5–15)
Anion gap: 12 (ref 5–15)
BUN: 5 mg/dL — ABNORMAL LOW (ref 8–23)
BUN: 5 mg/dL — ABNORMAL LOW (ref 8–23)
BUN: 5 mg/dL — ABNORMAL LOW (ref 8–23)
BUN: 7 mg/dL — ABNORMAL LOW (ref 8–23)
CO2: 22 mmol/L (ref 22–32)
CO2: 23 mmol/L (ref 22–32)
CO2: 22 mmol/L (ref 22–32)
CO2: 21 mmol/L — ABNORMAL LOW (ref 22–32)
Calcium: 7.8 mg/dL — ABNORMAL LOW (ref 8.9–10.3)
Calcium: 8.4 mg/dL — ABNORMAL LOW (ref 8.9–10.3)
Calcium: 8.3 mg/dL — ABNORMAL LOW (ref 8.9–10.3)
Calcium: 7.7 mg/dL — ABNORMAL LOW (ref 8.9–10.3)
Chloride: 88 mmol/L — ABNORMAL LOW (ref 98–111)
Chloride: 87 mmol/L — ABNORMAL LOW (ref 98–111)
Chloride: 87 mmol/L — ABNORMAL LOW (ref 98–111)
Chloride: 87 mmol/L — ABNORMAL LOW (ref 98–111)
Creatinine, Ser: 0.44 mg/dL — ABNORMAL LOW (ref 0.61–1.24)
Creatinine, Ser: 0.56 mg/dL — ABNORMAL LOW (ref 0.61–1.24)
Creatinine, Ser: 0.6 mg/dL — ABNORMAL LOW (ref 0.61–1.24)
Creatinine, Ser: 0.64 mg/dL (ref 0.61–1.24)
GFR, Estimated: >60 mL/min (ref >60)
GFR, Estimated: >60 mL/min (ref >60)
GFR, Estimated: >60 mL/min (ref >60)
GFR, Estimated: >60 mL/min (ref >60)
Glucose, Bld: 101 mg/dL — ABNORMAL HIGH (ref 70–99)
Glucose, Bld: 97 mg/dL (ref 70–99)
Glucose, Bld: 199 mg/dL — ABNORMAL HIGH (ref 70–99)
Glucose, Bld: 239 mg/dL — ABNORMAL HIGH (ref 70–99)
Potassium: 3.8 mmol/L (ref 3.5–5.1)
Potassium: 3.9 mmol/L (ref 3.5–5.1)
Potassium: 3.5 mmol/L (ref 3.5–5.1)
Potassium: 3.7 mmol/L (ref 3.5–5.1)
Sodium: 119 mmol/L — CL (ref 135–145)
Sodium: 122 mmol/L — ABNORMAL LOW (ref 135–145)
Sodium: 122 mmol/L — ABNORMAL LOW (ref 135–145)
Sodium: 120 mmol/L — ABNORMAL LOW (ref 135–145)

## 2020-12-04 LAB — CBC
HCT: 38 % — ABNORMAL LOW (ref 39.0–52.0)
Hemoglobin: 13.9 g/dL (ref 13.0–17.0)
MCH: 32.9 pg (ref 26.0–34.0)
MCHC: 36.6 g/dL — ABNORMAL HIGH (ref 30.0–36.0)
MCV: 89.8 fL (ref 80.0–100.0)
Platelets: 82 10*3/uL — ABNORMAL LOW (ref 150–400)
RBC: 4.23 MIL/uL (ref 4.22–5.81)
RDW: 13.2 % (ref 11.5–15.5)
WBC: 5.7 10*3/uL (ref 4.0–10.5)
nRBC: 0 % (ref 0.0–0.2)

## 2020-12-04 LAB — MRSA NEXT GEN BY PCR, NASAL: MRSA by PCR Next Gen: NOT DETECTED

## 2020-12-04 LAB — OSMOLALITY: Osmolality: 251 mOsm/kg — ABNORMAL LOW (ref 275–295)

## 2020-12-04 MED ORDER — CHLORHEXIDINE GLUCONATE CLOTH 2 % EX PADS
6.0000 | MEDICATED_PAD | Freq: Every day | CUTANEOUS | Status: DC
  Administered 2020-12-04 – 2020-12-08 (×5): 6 via TOPICAL

## 2020-12-04 MED ORDER — SODIUM CHLORIDE 0.9 % IV SOLN: INTRAVENOUS | Status: AC

## 2020-12-04 NOTE — Plan of Care (Signed)
  Problem: Education: Goal: Knowledge of General Education information will improve Description: Including pain rating scale, medication(s)/side effects and non-pharmacologic comfort measures Outcome: Progressing   Problem: Clinical Measurements: Goal: Will remain free from infection Outcome: Progressing Goal: Diagnostic test results will improve Outcome: Progressing Goal: Respiratory complications will improve Outcome: Progressing   Problem: Activity: Goal: Risk for activity intolerance will decrease Outcome: Progressing   Problem: Nutrition: Goal: Adequate nutrition will be maintained Outcome: Progressing   Problem: Pain Managment: Goal: General experience of comfort will improve Outcome: Progressing   Problem: Safety: Goal: Ability to remain free from injury will improve Outcome: Progressing   Problem: Skin Integrity: Goal: Risk for impaired skin integrity will decrease Outcome: Progressing   

## 2020-12-04 NOTE — Plan of Care (Signed)
Pt admitted to SDU for hyponatremia.  Educated pt on the importance of a balanced diet and fluids.  Educated on his risk for falls.  Problem: Education: Goal: Knowledge of General Education information will improve Description: Including pain rating scale, medication(s)/side effects and non-pharmacologic comfort measures Outcome: Progressing   Problem: Clinical Measurements: Goal: Will remain free from infection Outcome: Progressing Goal: Diagnostic test results will improve Outcome: Progressing Goal: Respiratory complications will improve Outcome: Progressing   Problem: Activity: Goal: Risk for activity intolerance will decrease Outcome: Progressing   Problem: Nutrition: Goal: Adequate nutrition will be maintained Outcome: Progressing   Problem: Pain Managment: Goal: General experience of comfort will improve Outcome: Progressing   Problem: Safety: Goal: Ability to remain free from injury will improve Outcome: Progressing   Problem: Skin Integrity: Goal: Risk for impaired skin integrity will decrease Outcome: Progressing

## 2020-12-04 NOTE — Progress Notes (Addendum)
PT Cancellation Note  Patient Details Name: Barry Fowler MRN: [id number] DOB: 06-30-1955   Cancelled Treatment:    Reason Eval/Treat Not Completed: Patient declined, no reason specified. Pt declines PT eval, states he is fine "getting by" at home on his own and has family checking on him. Pt states he doesn't need PT in hospital or after, just wants to go home. Pt pleasant, repeats "I'm fine right here" while supine in bed with HOB elevated with therapist encouraging pt to get OOB. Attempted to educate pt on therapeutic process and benefits of PT but pt politely declines. Will attempt eval again, if unsuccessful will sign off.    Domenick Bookbinder PT, DPT 12/04/20, 12:35 PM

## 2020-12-04 NOTE — TOC Initial Note (Signed)
Transition of Care [facility]) - Initial/Assessment Note    Patient Details  Name: Barry Fowler MRN: [id number] Date of Birth: Jun 18, 1955  [facility]) CM/SW Contact:    Annice Needy, LCSW Phone Number: 12/04/2020, 3:56 PM  Clinical Narrative:                 [facility] consulted for SA resources. Patient provided SA resources. Patient stated that he resides with his nephew; however, his nephew got "locked up" and they got evicted.  Provided homeless shelter list as well as [facility].   Expected Discharge Plan: Home/Self Care Barriers to Discharge: Continued Medical Work up   Patient Goals and CMS Choice Patient states their goals for this hospitalization and ongoing recovery are:: discharge      Expected Discharge Plan and Services Expected Discharge Plan: Home/Self Care       Living arrangements for the past 2 months: Single Family Home                                      Prior Living Arrangements/Services Living arrangements for the past 2 months: Single Family Home Lives with:: Self Patient language and need for interpreter reviewed:: Yes        Need for Family Participation in Patient Care: Yes (Comment) Care giver support system in place?: Yes (comment)   Criminal Activity/Legal Involvement Pertinent to Current Situation/Hospitalization: No - Comment as needed  Activities of Daily Living Home Assistive Devices/Equipment: Walker (specify type) ADL Screening (condition at time of admission) Patient's cognitive ability adequate to safely complete daily activities?: Yes Is the patient deaf or have difficulty hearing?: No Does the patient have difficulty seeing, even when wearing glasses/contacts?: Yes Does the patient have difficulty concentrating, remembering, or making decisions?: No Patient able to express need for assistance with ADLs?: Yes Does the patient have difficulty dressing or bathing?: No Independently performs  ADLs?: Yes (appropriate for developmental age) Does the patient have difficulty walking or climbing stairs?: No Weakness of Legs: Both Weakness of Arms/Hands: Both  Permission Sought/Granted                  Emotional Assessment     Affect (typically observed): Appropriate Orientation: : Oriented to Self, Oriented to Place, Oriented to  Time, Oriented to Situation Alcohol / Substance Use: Not Applicable Psych Involvement: No (comment)  Admission diagnosis:  Hyponatremia [E87.1] Hyperosmolar hyperglycemic state (HHS) (HCC) [E11.00, E11.65] Patient Active Problem List   Diagnosis Date Noted   Hypokalemia 12/03/2020   Abnormal LFTs (liver function tests)    Melena    Hyponatremia 01/11/2020   Alcohol abuse 01/11/2020   PCP:  Patient, No Pcp Per (Inactive) Pharmacy:   [facility] [address], [place] - [address] [place] [address] Phone: [phone] Fax: [phone]  [facility] - [place], [place] - [address] [place] [address] Phone: [phone] Fax: [phone]     Social Determinants of Health (SDOH) Interventions    Readmission Risk Interventions No flowsheet data found.

## 2020-12-04 NOTE — Progress Notes (Signed)
PROGRESS NOTE    Barry Fowler  NGE:[id number] DOB: 03-19-1956 DOA: 12/03/2020 PCP: Patient, No Pcp Per (Inactive)   Brief Narrative:    Barry Fowler is a 65 y.o. male with medical history significant for alcohol abuse, hypertension, hyponatremia. Patient was brought to the ED via EMS for reports of generalized weakness.  Patient has had good oral intake and apparently fell a few days ago and hit his head.  He drinks a 12 pack of beer every day.  Head CT unremarkable and patient was noted to have hyponatremia related to beer Poto mania that has been improving even without the use of IV normal saline.  PT evaluation ordered and pending given falls at home.  Assessment & Plan:   Principal Problem:   Hyponatremia Active Problems:   Alcohol abuse   Abnormal LFTs (liver function tests)   Hypokalemia   Hyponatremia likely secondary to beer potomania-improving -Continue to monitor off normal saline -Likely has a chronic element of hyponatremia -Repeat BMP as ordered -TSH 0.5 -Urine osmolarity is appropriately low -Okay for transfer to telemetry  Fall/alcohol abuse -CT head with no acute findings -PT evaluation -TOC assistance appreciated -CIWA protocol  Thrombocytopenia -Likely secondary to alcohol use -Avoid heparin agents -Monitor  Abnormal LFTs likely secondary to alcoholic liver disease -Fatty liver noted on abdominal ultrasound 10/21 -Acute hepatitis panel nonreactive   DVT prophylaxis: SCDs Code Status: Full Family Communication: Discussed with his sister on phone 9/8 Disposition Plan:  Status is: Inpatient  Remains inpatient appropriate because:Altered mental status, IV treatments appropriate due to intensity of illness or inability to take PO, and Inpatient level of care appropriate due to severity of illness  Dispo: The patient is from: Home              Anticipated d/c is to: Home              Patient currently is not medically stable to d/c.   Difficult to  place patient No   Consultants:  None  Procedures:  See below  Antimicrobials:  None   Subjective: Patient seen and evaluated today with some ongoing weakness and confusion noted. No acute concerns or events noted overnight.  Objective: Vitals:   12/04/20 0500 12/04/20 0501 12/04/20 0600 12/04/20 0700  BP:   120/76 112/82  Pulse: 73  77 93  Resp: (!) 25  (!) 21 20  Temp:  98.1 F (36.7 C)  98.2 F (36.8 C)  TempSrc:  Oral  Oral  SpO2: 92%  94% 94%  Weight:      Height:        Intake/Output Summary (Last 24 hours) at 12/04/2020 0922 Last data filed at 12/04/2020 0502 Gross per 24 hour  Intake 584.34 ml  Output 1275 ml  Net -690.66 ml   Filed Weights   12/03/20 1601  Weight: 72.6 kg    Examination:  General exam: Appears calm and comfortable, confused Respiratory system: Clear to auscultation. Respiratory effort normal. Cardiovascular system: S1 & S2 heard, RRR.  Gastrointestinal system: Abdomen is soft Central nervous system: Alert and awake Extremities: No edema Skin: No significant lesions noted Psychiatry: Flat affect.    Data Reviewed: I have personally reviewed following labs and imaging studies  CBC: Recent Labs  Lab 12/03/20 1630 12/04/20 0231  WBC 5.3 5.7  NEUTROABS 3.4  --   HGB 14.0 13.9  HCT 37.8* 38.0*  MCV 87.7 89.8  PLT 78* 82*   Basic Metabolic Panel: Recent Labs  Lab 12/03/20  1630 12/03/20 1849 12/03/20 2237 12/04/20 0231 12/04/20 0701  NA 111*  --  117* 119* 122*  K 3.1*  --  3.6 3.8 3.9  CL 76*  --  82* 88* 87*  CO2 22  --  24 22 23   GLUCOSE 88  --  89 101* 97  BUN <5*  --  <5* 5* 5*  CREATININE 0.38*  --  0.54* 0.44* 0.56*  CALCIUM 7.8*  --  7.9* 7.8* 8.4*  MG  --  1.6*  --   --   --   PHOS  --  2.4*  --   --   --    GFR: Estimated Creatinine Clearance: 97.1 mL/min (A) (by C-G formula based on SCr of 0.56 mg/dL (L)). Liver Function Tests: Recent Labs  Lab 12/03/20 1630  AST 121*  ALT 76*  ALKPHOS 81   BILITOT 1.2  PROT 6.7  ALBUMIN 3.3*   Recent Labs  Lab 12/03/20 1630  LIPASE 25   No results for input(s): AMMONIA in the last 168 hours. Coagulation Profile: Recent Labs  Lab 12/03/20 1630  INR 0.9   Cardiac Enzymes: No results for input(s): CKTOTAL, CKMB, CKMBINDEX, TROPONINI in the last 168 hours. BNP (last 3 results) No results for input(s): PROBNP in the last 8760 hours. HbA1C: No results for input(s): HGBA1C in the last 72 hours. CBG: Recent Labs  Lab 12/03/20 2035  GLUCAP 93   Lipid Profile: No results for input(s): CHOL, HDL, LDLCALC, TRIG, CHOLHDL, LDLDIRECT in the last 72 hours. Thyroid Function Tests: Recent Labs    12/03/20 1849  TSH 0.502   Anemia Panel: No results for input(s): VITAMINB12, FOLATE, FERRITIN, TIBC, IRON, RETICCTPCT in the last 72 hours. Sepsis Labs: Recent Labs  Lab 12/03/20 1630 12/03/20 1822  LATICACIDVEN 2.3* 2.2*    Recent Results (from the past 240 hour(s))  Resp Panel by RT-PCR (Flu A&B, Covid) Nasopharyngeal Swab     Status: None   Collection Time: 12/03/20  6:06 PM   Specimen: Nasopharyngeal Swab; Nasopharyngeal(NP) swabs in vial transport medium  Result Value Ref Range Status   SARS Coronavirus 2 by RT PCR NEGATIVE NEGATIVE Final    Comment: (NOTE) SARS-CoV-2 target nucleic acids are NOT DETECTED.  The SARS-CoV-2 RNA is generally detectable in upper respiratory specimens during the acute phase of infection. The lowest concentration of SARS-CoV-2 viral copies this assay can detect is 138 copies/mL. A negative result does not preclude SARS-Cov-2 infection and should not be used as the sole basis for treatment or other patient management decisions. A negative result may occur with  improper specimen collection/handling, submission of specimen other than nasopharyngeal swab, presence of viral mutation(s) within the areas targeted by this assay, and inadequate number of viral copies(<138 copies/mL). A negative result  must be combined with clinical observations, patient history, and epidemiological information. The expected result is Negative.  Fact Sheet for Patients:  02/02/21  Fact Sheet for Healthcare Providers:  BloggerCourse.com  This test is no t yet approved or cleared by the [place] FDA and  has been authorized for detection and/or diagnosis of SARS-CoV-2 by FDA under an Emergency Use Authorization (EUA). This EUA will remain  in effect (meaning this test can be used) for the duration of the COVID-19 declaration under Section 564(b)(1) of the Act, 21 U.S.C.section 360bbb-3(b)(1), unless the authorization is terminated  or revoked sooner.       Influenza A by PCR NEGATIVE NEGATIVE Final   Influenza B by PCR NEGATIVE  NEGATIVE Final    Comment: (NOTE) The Xpert Xpress SARS-CoV-2/FLU/RSV plus assay is intended as an aid in the diagnosis of influenza from Nasopharyngeal swab specimens and should not be used as a sole basis for treatment. Nasal washings and aspirates are unacceptable for Xpert Xpress SARS-CoV-2/FLU/RSV testing.  Fact Sheet for Patients: BloggerCourse.com  Fact Sheet for Healthcare Providers: SeriousBroker.it  This test is not yet approved or cleared by the [place] FDA and has been authorized for detection and/or diagnosis of SARS-CoV-2 by FDA under an Emergency Use Authorization (EUA). This EUA will remain in effect (meaning this test can be used) for the duration of the COVID-19 declaration under Section 564(b)(1) of the Act, 21 U.S.C. section 360bbb-3(b)(1), unless the authorization is terminated or revoked.  Performed at [facility], [address] [place], [place] [address]   MRSA Next Gen by PCR, Nasal     Status: None   Collection Time: 12/03/20 11:09 PM   Specimen: Nasal Mucosa; Nasal Swab  Result Value Ref Range Status   MRSA by PCR Next  Gen NOT DETECTED NOT DETECTED Final    Comment: (NOTE) The GeneXpert MRSA Assay (FDA approved for NASAL specimens only), is one component of a comprehensive MRSA colonization surveillance program. It is not intended to diagnose MRSA infection nor to guide or monitor treatment for MRSA infections. Test performance is not FDA approved in patients less than 72 years old. Performed at [facility], [address] [place], [place] [address]          Radiology Studies: CT HEAD WO CONTRAST ( )  Result Date: 12/03/2020 CLINICAL DATA:  Fall, hit head EXAM: CT HEAD WITHOUT CONTRAST TECHNIQUE: Contiguous axial images were obtained from the base of the skull through the vertex without intravenous contrast. COMPARISON:  01/11/2020 FINDINGS: Brain: There is atrophy and chronic small vessel disease changes. No acute intracranial abnormality. Specifically, no hemorrhage, hydrocephalus, mass lesion, acute infarction, or significant intracranial injury. Vascular: No hyperdense vessel or unexpected calcification. Skull: No acute calvarial abnormality. Sinuses/Orbits: Visualized paranasal sinuses and mastoids clear. Orbital soft tissues unremarkable. Other: 90 IMPRESSION: Atrophy, chronic microvascular disease. No acute intracranial abnormality. Electronically Signed   By: Charlett Nose M.D.   On: 12/03/2020 19:51   DG Chest Port 1 View  Result Date: 12/03/2020 CLINICAL DATA:  Weakness, fell several days ago, history hypertension EXAM: PORTABLE CHEST 1 VIEW COMPARISON:  Portable exam 1612 hours compared to 01/11/2020 FINDINGS: Normal heart size, mediastinal contours, and pulmonary vascularity. Atherosclerotic calcification aorta. Emphysematous and bronchitic changes question COPD. Lungs clear. No pulmonary infiltrate, pleural effusion, or pneumothorax. Osseous demineralization. 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 Aortic Atherosclerosis (ICD10-I70.0) and Emphysema (ICD10-J43.9). Electronically  Signed   By: Ulyses Southward M.D.   On: 12/03/2020 16:40        Scheduled Meds:  Chlorhexidine Gluconate Cloth  6 each Topical Daily   erythromycin   Left Eye Q8H   folic acid  1 mg Oral Daily   multivitamin with minerals  1 tablet Oral Daily   thiamine  100 mg Oral Daily   Or   thiamine  100 mg Intravenous Daily     LOS: 1 day    Time spent: 35 minutes    Keshauna Degraffenreid Hoover Brunette, DO [facility]  If 7PM-7AM, please contact night-coverage [email or web address] 12/04/2020, 9:22 AM

## 2020-12-05 DIAGNOSIS — E871 Hypo-osmolality and hyponatremia: Secondary | ICD-10-CM | POA: Diagnosis not present

## 2020-12-05 LAB — CBC
HCT: 35.5 % — ABNORMAL LOW (ref 39.0–52.0)
Hemoglobin: 12.5 g/dL — ABNORMAL LOW (ref 13.0–17.0)
MCH: 32.2 pg (ref 26.0–34.0)
MCHC: 35.2 g/dL (ref 30.0–36.0)
MCV: 91.5 fL (ref 80.0–100.0)
Platelets: 96 10*3/uL — ABNORMAL LOW (ref 150–400)
RBC: 3.88 MIL/uL — ABNORMAL LOW (ref 4.22–5.81)
RDW: 13.5 % (ref 11.5–15.5)
WBC: 4.8 10*3/uL (ref 4.0–10.5)
nRBC: 0 % (ref 0.0–0.2)

## 2020-12-05 LAB — COMPREHENSIVE METABOLIC PANEL
ALT: 53 U/L — ABNORMAL HIGH (ref 0–44)
AST: 59 U/L — ABNORMAL HIGH (ref 15–41)
Albumin: 2.8 g/dL — ABNORMAL LOW (ref 3.5–5.0)
Alkaline Phosphatase: 80 U/L (ref 38–126)
Anion gap: 6 (ref 5–15)
BUN: 7 mg/dL — ABNORMAL LOW (ref 8–23)
CO2: 27 mmol/L (ref 22–32)
Calcium: 8 mg/dL — ABNORMAL LOW (ref 8.9–10.3)
Chloride: 92 mmol/L — ABNORMAL LOW (ref 98–111)
Creatinine, Ser: 0.46 mg/dL — ABNORMAL LOW (ref 0.61–1.24)
GFR, Estimated: >60 mL/min (ref >60)
Glucose, Bld: 125 mg/dL — ABNORMAL HIGH (ref 70–99)
Potassium: 3.1 mmol/L — ABNORMAL LOW (ref 3.5–5.1)
Sodium: 125 mmol/L — ABNORMAL LOW (ref 135–145)
Total Bilirubin: 0.8 mg/dL (ref 0.3–1.2)
Total Protein: 5.8 g/dL — ABNORMAL LOW (ref 6.5–8.1)

## 2020-12-05 LAB — MAGNESIUM: Magnesium: 2 mg/dL (ref 1.7–2.4)

## 2020-12-05 MED ORDER — POTASSIUM CHLORIDE CRYS ER 20 MEQ PO TBCR
40.0000 meq | EXTENDED_RELEASE_TABLET | Freq: Once | ORAL | Status: AC
  Administered 2020-12-05: 40 meq via ORAL
  Filled 2020-12-05: qty 2

## 2020-12-05 MED ORDER — SODIUM CHLORIDE 0.9 % IV SOLN: INTRAVENOUS | Status: AC

## 2020-12-05 NOTE — Progress Notes (Signed)
PROGRESS NOTE    Barry Fowler  RSW:[id number] DOB: [birth year] DOA: 12/03/2020 PCP: Patient, No Pcp Per (Inactive)   Brief Narrative:   Barry Fowler is a 65 y.o. male with medical history significant for alcohol abuse, hypertension, hyponatremia. Patient was brought to the ED via EMS for reports of generalized weakness.  Patient has had good oral intake and apparently fell a few days ago and hit his head.  He drinks a 12 pack of beer every day.  Head CT unremarkable and patient was noted to have hyponatremia related to beer Poto mania that has been improving even without the use of IV normal saline.  PT evaluation recommending SNF which patient is agreeable to.  Sodium levels have stabilized.  Assessment & Plan:   Principal Problem:   Hyponatremia Active Problems:   Alcohol abuse   Abnormal LFTs (liver function tests)   Hypokalemia   Hyponatremia likely secondary to beer potomania-improving -Appears to be near baseline with chronic hyponatremia in the high 120 range -Continue brief dose of normal saline -Likely has a chronic element of hyponatremia -Repeat BMP as ordered -TSH 0.5 -Urine osmolarity is appropriately low -Okay for transfer to telemetry when bed available   Fall/alcohol abuse -CT head with no acute findings -PT evaluation revealing need for SNF -TOC assistance appreciated -CIWA protocol   Thrombocytopenia-improving -Likely secondary to alcohol use -Avoid heparin agents -Monitor   Abnormal LFTs likely secondary to alcoholic liver disease -Downtrending -Fatty liver noted on abdominal ultrasound 10/21 -Acute hepatitis panel nonreactive     DVT prophylaxis: SCDs Code Status: Full Family Communication: Discussed with his sister on phone 9/8 Disposition Plan:  Status is: Inpatient   Remains inpatient appropriate because:Altered mental status, IV treatments appropriate due to intensity of illness or inability to take PO, and Inpatient level of care appropriate  due to severity of illness   Dispo: The patient is from: Home              Anticipated d/c is to: SNF              Patient currently is not medically stable to d/c.              Difficult to place patient No     Consultants:  None   Procedures:  See below   Antimicrobials:  None    Subjective: Patient seen and evaluated today with no new acute complaints or concerns. No acute concerns or events noted overnight.  Objective: Vitals:   12/05/20 0900 12/05/20 1000 12/05/20 1052 12/05/20 1100  BP:      Pulse: 97 80 73 (!) 58  Resp: (!) 31 (!) 26 (!) 23 17  Temp:      TempSrc:      SpO2: 91% (!) 84% 94% 99%  Weight:      Height:        Intake/Output Summary (Last 24 hours) at 12/05/2020 1108 Last data filed at 12/05/2020 0600 Gross per 24 hour  Intake 742.75 ml  Output 1100 ml  Net -357.25 ml   Filed Weights   12/03/20 1601 12/05/20 0450  Weight: 72.6 kg 67.1 kg    Examination:  General exam: Appears calm and comfortable  Respiratory system: Clear to auscultation. Respiratory effort normal. Cardiovascular system: S1 & S2 heard, RRR.  Gastrointestinal system: Abdomen is soft Central nervous system: Alert and awake Extremities: No edema Skin: No significant lesions noted Psychiatry: Flat affect.    Data Reviewed: I have personally reviewed following labs  and imaging studies  CBC: Recent Labs  Lab 12/03/20 1630 12/04/20 0231 12/05/20 0457  WBC 5.3 5.7 4.8  NEUTROABS 3.4  --   --   HGB 14.0 13.9 12.5*  HCT 37.8* 38.0* 35.5*  MCV 87.7 89.8 91.5  PLT 78* 82* 96*   Basic Metabolic Panel: Recent Labs  Lab 12/03/20 1849 12/03/20 2237 12/04/20 0231 12/04/20 0701 12/04/20 0926 12/04/20 1337 12/05/20 0457  NA  --    < > 119* 122* 122* 120* 125*  K  --    < > 3.8 3.9 3.5 3.7 3.1*  CL  --    < > 88* 87* 87* 87* 92*  CO2  --    < > 22 23 22  21* 27  GLUCOSE  --    < > 101* 97 199* 239* 125*  BUN  --    < > 5* 5* 5* 7* 7*  CREATININE  --    < > 0.44*  0.56* 0.60* 0.64 0.46*  CALCIUM  --    < > 7.8* 8.4* 8.3* 7.7* 8.0*  MG 1.6*  --   --   --   --   --  2.0  PHOS 2.4*  --   --   --   --   --   --    < > = values in this interval not displayed.   GFR: Estimated Creatinine Clearance: 89.7 mL/min (A) (by C-G formula based on SCr of 0.46 mg/dL (L)). Liver Function Tests: Recent Labs  Lab 12/03/20 1630 12/05/20 0457  AST 121* 59*  ALT 76* 53*  ALKPHOS 81 80  BILITOT 1.2 0.8  PROT 6.7 5.8*  ALBUMIN 3.3* 2.8*   Recent Labs  Lab 12/03/20 1630  LIPASE 25   No results for input(s): AMMONIA in the last 168 hours. Coagulation Profile: Recent Labs  Lab 12/03/20 1630  INR 0.9   Cardiac Enzymes: No results for input(s): CKTOTAL, CKMB, CKMBINDEX, TROPONINI in the last 168 hours. BNP (last 3 results) No results for input(s): PROBNP in the last 8760 hours. HbA1C: No results for input(s): HGBA1C in the last 72 hours. CBG: Recent Labs  Lab 12/03/20 2035  GLUCAP 93   Lipid Profile: No results for input(s): CHOL, HDL, LDLCALC, TRIG, CHOLHDL, LDLDIRECT in the last 72 hours. Thyroid Function Tests: Recent Labs    12/03/20 1849  TSH 0.502   Anemia Panel: No results for input(s): VITAMINB12, FOLATE, FERRITIN, TIBC, IRON, RETICCTPCT in the last 72 hours. Sepsis Labs: Recent Labs  Lab 12/03/20 1630 12/03/20 1822  LATICACIDVEN 2.3* 2.2*    Recent Results (from the past 240 hour(s))  Resp Panel by RT-PCR (Flu A&B, Covid) Nasopharyngeal Swab     Status: None   Collection Time: 12/03/20  6:06 PM   Specimen: Nasopharyngeal Swab; Nasopharyngeal(NP) swabs in vial transport medium  Result Value Ref Range Status   SARS Coronavirus 2 by RT PCR NEGATIVE NEGATIVE Final    Comment: (NOTE) SARS-CoV-2 target nucleic acids are NOT DETECTED.  The SARS-CoV-2 RNA is generally detectable in upper respiratory specimens during the acute phase of infection. The lowest concentration of SARS-CoV-2 viral copies this assay can detect is 138  copies/mL. A negative result does not preclude SARS-Cov-2 infection and should not be used as the sole basis for treatment or other patient management decisions. A negative result may occur with  improper specimen collection/handling, submission of specimen other than nasopharyngeal swab, presence of viral mutation(s) within the areas targeted by this assay, and  inadequate number of viral copies(<138 copies/mL). A negative result must be combined with clinical observations, patient history, and epidemiological information. The expected result is Negative.  Fact Sheet for Patients:  BloggerCourse.com  Fact Sheet for Healthcare Providers:  SeriousBroker.it  This test is no t yet approved or cleared by the [place] FDA and  has been authorized for detection and/or diagnosis of SARS-CoV-2 by FDA under an Emergency Use Authorization (EUA). This EUA will remain  in effect (meaning this test can be used) for the duration of the COVID-19 declaration under Section 564(b)(1) of the Act, 21 U.S.C.section 360bbb-3(b)(1), unless the authorization is terminated  or revoked sooner.       Influenza A by PCR NEGATIVE NEGATIVE Final   Influenza B by PCR NEGATIVE NEGATIVE Final    Comment: (NOTE) The Xpert Xpress SARS-CoV-2/FLU/RSV plus assay is intended as an aid in the diagnosis of influenza from Nasopharyngeal swab specimens and should not be used as a sole basis for treatment. Nasal washings and aspirates are unacceptable for Xpert Xpress SARS-CoV-2/FLU/RSV testing.  Fact Sheet for Patients: BloggerCourse.com  Fact Sheet for Healthcare Providers: SeriousBroker.it  This test is not yet approved or cleared by the [place] FDA and has been authorized for detection and/or diagnosis of SARS-CoV-2 by FDA under an Emergency Use Authorization (EUA). This EUA will remain in effect (meaning  this test can be used) for the duration of the COVID-19 declaration under Section 564(b)(1) of the Act, 21 U.S.C. section 360bbb-3(b)(1), unless the authorization is terminated or revoked.  Performed at [facility], [address], [place], [place] [address]   MRSA Next Gen by PCR, Nasal     Status: None   Collection Time: 12/03/20 11:09 PM   Specimen: Nasal Mucosa; Nasal Swab  Result Value Ref Range Status   MRSA by PCR Next Gen NOT DETECTED NOT DETECTED Final    Comment: (NOTE) The GeneXpert MRSA Assay (FDA approved for NASAL specimens only), is one component of a comprehensive MRSA colonization surveillance program. It is not intended to diagnose MRSA infection nor to guide or monitor treatment for MRSA infections. Test performance is not FDA approved in patients less than 3 years old. Performed at [facility], [address], [place], [place] [address]          Radiology Studies: CT HEAD WO CONTRAST ( )  Result Date: 12/03/2020 CLINICAL DATA:  Fall, hit head EXAM: CT HEAD WITHOUT CONTRAST TECHNIQUE: Contiguous axial images were obtained from the base of the skull through the vertex without intravenous contrast. COMPARISON:  01/11/2020 FINDINGS: Brain: There is atrophy and chronic small vessel disease changes. No acute intracranial abnormality. Specifically, no hemorrhage, hydrocephalus, mass lesion, acute infarction, or significant intracranial injury. Vascular: No hyperdense vessel or unexpected calcification. Skull: No acute calvarial abnormality. Sinuses/Orbits: Visualized paranasal sinuses and mastoids clear. Orbital soft tissues unremarkable. Other: 90 IMPRESSION: Atrophy, chronic microvascular disease. No acute intracranial abnormality. Electronically Signed   By: Charlett Nose M.D.   On: 12/03/2020 19:51   DG Chest Port 1 View  Result Date: 12/03/2020 CLINICAL DATA:  Weakness, fell several days ago, history hypertension EXAM: PORTABLE CHEST 1 VIEW COMPARISON:  Portable  exam 1612 hours compared to 01/11/2020 FINDINGS: Normal heart size, mediastinal contours, and pulmonary vascularity. Atherosclerotic calcification aorta. Emphysematous and bronchitic changes question COPD. Lungs clear. No pulmonary infiltrate, pleural effusion, or pneumothorax. Osseous demineralization. 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 Aortic Atherosclerosis (ICD10-I70.0) and Emphysema (ICD10-J43.9). Electronically Signed   By: Angelyn Punt.D.  On: 12/03/2020 16:40        Scheduled Meds:  Chlorhexidine Gluconate Cloth  6 each Topical Daily   erythromycin   Left Eye Q8H   folic acid  1 mg Oral Daily   multivitamin with minerals  1 tablet Oral Daily   thiamine  100 mg Oral Daily   Or   thiamine  100 mg Intravenous Daily   Continuous Infusions:  sodium chloride       LOS: 2 days    Time spent: 35 minutes    Jelene Albano Hoover Brunette Tecla Mailloux, DO [facility]  If 7PM-7AM, please contact night-coverage [email or web address] 12/05/2020, 11:08 AM

## 2020-12-05 NOTE — Plan of Care (Signed)
  Problem: Acute Rehab PT Goals(only PT should resolve) Goal: Pt Will Go Supine/Side To Sit Outcome: Progressing Flowsheets (Taken 12/05/2020 1031) Pt will go Supine/Side to Sit: with modified independence Goal: Patient Will Transfer Sit To/From Stand Outcome: Progressing Flowsheets (Taken 12/05/2020 1031) Patient will transfer sit to/from stand:  with supervision  with modified independence Goal: Pt Will Transfer Bed To Chair/Chair To Bed Outcome: Progressing Flowsheets (Taken 12/05/2020 1031) Pt will Transfer Bed to Chair/Chair to Bed: with supervision Goal: Pt Will Ambulate Outcome: Progressing Flowsheets (Taken 12/05/2020 1031) Pt will Ambulate:  75 feet  with min guard assist  with rolling walker   10:31 AM, 12/05/20 Ocie Bob, MPT Physical Therapist with [facility] [phone] office [phone] mobile phone

## 2020-12-05 NOTE — Evaluation (Signed)
Physical Therapy Evaluation Patient Details Name: Barry Fowler MRN: [id number] DOB: 01-31-1956 Today's Date: 12/05/2020   History of Present Illness  Barry Fowler is a 65 y.o. male with medical history significant for alcohol abuse, hypertension, hyponatremia.  Patient was brought to the ED via EMS for reports of generalized weakness.  Patient's family called EMS.  Weakness has been ongoing for several days.  He reports good oral intake.  He denies nausea, no vomiting , no significant diarrhea.  No seizures.  He denies confusion.  Reports he fell a few days ago and reports that he hit his head.    Patient drinks at about 12 pack of beer every day.  His last drink was yesterday afternoon at about 4 PM.   Clinical Impression  Patient demonstrates slow labored movement for sitting up at bedside, unsteady on feet requiring repeated attempts before able to stand, slow labored cadence with ataxic like movement, frequent drifting left/right, increased time for making turns and limited mostly due to fatigue.  Patient tolerated sitting up in chair after therapy - nursing staff aware.  Patient will benefit from continued physical therapy in hospital and recommended venue below to increase strength, balance, endurance for safe ADLs and gait.       Follow Up Recommendations SNF;Supervision for mobility/OOB;Supervision - Intermittent    Equipment Recommendations  None recommended by PT    Recommendations for Other Services       Precautions / Restrictions Precautions Precautions: Fall Restrictions Weight Bearing Restrictions: No      Mobility  Bed Mobility Overal bed mobility: Needs Assistance Bed Mobility: Supine to Sit     Supine to sit: Supervision;Min guard     General bed mobility comments: increased time, labored movement    Transfers Overall transfer level: Needs assistance Equipment used: Rolling walker (2 wheeled) Transfers: Sit to/from [employer] Sit to Stand:  Min assist Stand pivot transfers: Min assist       General transfer comment: increased time, labored movement  Ambulation/Gait Ambulation/Gait assistance: Min assist Gait Distance (Feet): 45 Feet Assistive device: Rolling walker (2 wheeled) Gait Pattern/deviations: Decreased step length - right;Decreased step length - left;Decreased stride length;Ataxic;Trunk flexed;Drifts right/left Gait velocity: decreased   General Gait Details: slow labored cadence with ataxic like movement of BLE, flexed trunk, frequent drifting left/right and limited mostly due to fatigue  Stairs            Wheelchair Mobility    Modified Rankin (Stroke Patients Only)       Balance Overall balance assessment: Needs assistance Sitting-balance support: Feet supported;No upper extremity supported Sitting balance-Leahy Scale: Fair Sitting balance - Comments: fair/good seated at EOB   Standing balance support: During functional activity;Bilateral upper extremity supported Standing balance-Leahy Scale: Poor Standing balance comment: fair/poor using RW                             Pertinent Vitals/Pain Pain Assessment: No/denies pain    Home Living Family/patient expects to be discharged to:: Private residence Living Arrangements: Alone Available Help at Discharge: Family;Available 24 hours/day Type of Home: House Home Access: Ramped entrance     Home Layout: One level Home Equipment: Shower seat;Cane - quad;Wheelchair - manual;Walker - 2 wheels      Prior Function Level of Independence: Needs assistance   Gait / Transfers Assistance Needed: household ambulator using RW, wheelchair for longer distances  ADL's / Homemaking Assistance Needed: assisted by family  Hand Dominance   Dominant Hand: Right    Extremity/Trunk Assessment   Upper Extremity Assessment Upper Extremity Assessment: Generalized weakness    Lower Extremity Assessment Lower Extremity  Assessment: Generalized weakness    Cervical / Trunk Assessment Cervical / Trunk Assessment: Kyphotic  Communication   Communication: No difficulties  Cognition Arousal/Alertness: Awake/alert Behavior During Therapy: WFL for tasks assessed/performed Overall Cognitive Status: Within Functional Limits for tasks assessed                                        General Comments      Exercises     Assessment/Plan    PT Assessment Patient needs continued PT services  PT Problem List Decreased strength;Decreased activity tolerance;Decreased balance;Decreased mobility       PT Treatment Interventions DME instruction;Gait training;Functional mobility training;Stair training;Therapeutic activities;Patient/family education;Balance training    PT Goals (Current goals can be found in the Care Plan section)  Acute Rehab PT Goals Patient Stated Goal: return home with assist PT Goal Formulation: With patient Time For Goal Achievement: 12/19/20 Potential to Achieve Goals: Good    Frequency Min 3X/week   Barriers to discharge        Co-evaluation               AM-PAC PT "6 Clicks" Mobility  Outcome Measure Help needed turning from your back to your side while in a flat bed without using bedrails?: None Help needed moving from lying on your back to sitting on the side of a flat bed without using bedrails?: A Little Help needed moving to and from a bed to a chair (including a wheelchair)?: A Little Help needed standing up from a chair using your arms (e.g., wheelchair or bedside chair)?: A Little Help needed to walk in hospital room?: A Lot Help needed climbing 3-5 steps with a railing? : A Lot 6 Click Score: 17    End of Session   Activity Tolerance: Patient tolerated treatment well;Patient limited by fatigue Patient left: in chair;with call bell/phone within reach;with chair alarm set Nurse Communication: Mobility status PT Visit Diagnosis: Unsteadiness on  feet (R26.81);Other abnormalities of gait and mobility (R26.89);Muscle weakness (generalized) (M62.81)    Time: 4008-6761 PT Time Calculation (min) (ACUTE ONLY): 28 min   Charges:   PT Evaluation $PT Eval Moderate Complexity: 1 Mod PT Treatments $Therapeutic Activity: 23-37 mins        10:30 AM, 12/05/20 Ocie Bob, MPT Physical Therapist with [facility] [phone] office [phone] mobile phone

## 2020-12-05 NOTE — TOC Progression Note (Signed)
Transition of Care [facility]) - Progression Note    Patient Details  Name: Barry Fowler MRN: [id number] Date of Birth: 1955/12/18  [facility]) CM/SW Contact  Karn Cassis, [place] Phone Number: 12/05/2020, 11:04 AM  Clinical Narrative:   PT recommending SNF. LCSW discussed with pt who agrees, although is somewhat hesitant. He requests [facility]. LCSW will start SNF search and SNF will start auth once selected.     Expected Discharge Plan: Home/Self Care Barriers to Discharge: Continued Medical Work up  Expected Discharge Plan and Services Expected Discharge Plan: Home/Self Care       Living arrangements for the past 2 months: Single Family Home                                       Social Determinants of Health (SDOH) Interventions    Readmission Risk Interventions No flowsheet data found.

## 2020-12-05 NOTE — NC FL2 (Signed)
[place] MEDICAID FL2 LEVEL OF CARE SCREENING TOOL     IDENTIFICATION  Patient Name: Barry Fowler Birthdate: February 14, 1956 Sex: male Admission Date (Current Location): 12/03/2020  [facility] and [place] Number:  [employer] and Address:  [facility],  [address], [place] [address]      Provider Number: [id number]  Attending Physician Name and Address:  Erick Blinks, DO  Relative Name and Phone Number:       Current Level of Care: Hospital Recommended Level of Care: Skilled Nursing Facility Prior Approval Number:    Date Approved/Denied:   PASRR Number: [id number]  Discharge Plan: SNF    Current Diagnoses: Patient Active Problem List   Diagnosis Date Noted   Hypokalemia 12/03/2020   Abnormal LFTs (liver function tests)    Melena    Hyponatremia 01/11/2020   Alcohol abuse 01/11/2020    Orientation RESPIRATION BLADDER Height & Weight     Self, Time, Situation, Place  Normal External catheter Weight: 147 lb 14.9 oz (67.1 kg) Height:  6' (182.9 cm)  BEHAVIORAL SYMPTOMS/MOOD NEUROLOGICAL BOWEL NUTRITION STATUS      Continent Diet (Regular. See d/c summary for updates.)  AMBULATORY STATUS COMMUNICATION OF NEEDS Skin   Limited Assist Verbally Skin abrasions, Bruising                       Personal Care Assistance Level of Assistance  Bathing, Feeding, Dressing Bathing Assistance: Limited assistance Feeding assistance: Limited assistance Dressing Assistance: Limited assistance     Functional Limitations Info  Hearing, Sight, Speech Sight Info: Adequate Hearing Info: Adequate Speech Info: Adequate    SPECIAL CARE FACTORS FREQUENCY  PT (By licensed PT)     PT Frequency: 5x weekly              Contractures      Additional Factors Info  Code Status, Allergies Code Status Info: Full code Allergies Info: No known allergies           Current Medications (12/05/2020):  This is the current hospital active medication  list Current Facility-Administered Medications  Medication Dose Route Frequency Provider Last Rate Last Admin   0.9 %  sodium chloride infusion   Intravenous Continuous Sherryll Burger, Pratik D, DO       acetaminophen (TYLENOL) tablet 650 mg  650 mg Oral Q6H PRN Emokpae, Ejiroghene E, MD       Or   acetaminophen (TYLENOL) suppository 650 mg  650 mg Rectal Q6H PRN Emokpae, Ejiroghene E, MD       Chlorhexidine Gluconate Cloth 2 % PADS 6 each  6 each Topical Daily Maurilio Lovely D, DO   6 each at 12/05/20 0837   erythromycin ophthalmic ointment   Left Eye Q8H Emokpae, Ejiroghene E, MD   Given at 12/05/20 0102   folic acid (FOLVITE) tablet 1 mg  1 mg Oral Daily Emokpae, Ejiroghene E, MD   1 mg at 12/05/20 0836   LORazepam (ATIVAN) tablet 1-4 mg  1-4 mg Oral Q1H PRN Emokpae, Ejiroghene E, MD       Or   LORazepam (ATIVAN) injection 1-4 mg  1-4 mg Intravenous Q1H PRN Emokpae, Ejiroghene E, MD       multivitamin with minerals tablet 1 tablet  1 tablet Oral Daily Emokpae, Ejiroghene E, MD   1 tablet at 12/05/20 0837   ondansetron (ZOFRAN) tablet 4 mg  4 mg Oral Q6H PRN Emokpae, Heloise Beecham, MD  Or   ondansetron (ZOFRAN) injection 4 mg  4 mg Intravenous Q6H PRN Emokpae, Ejiroghene E, MD       polyethylene glycol (MIRALAX / GLYCOLAX) packet 17 g  17 g Oral Daily PRN Emokpae, Ejiroghene E, MD       thiamine tablet 100 mg  100 mg Oral Daily Emokpae, Ejiroghene E, MD   100 mg at 12/05/20 1610   Or   thiamine (B-1) injection 100 mg  100 mg Intravenous Daily Emokpae, Ejiroghene E, MD         Discharge Medications: Please see discharge summary for a list of discharge medications.  Relevant Imaging Results:  Relevant Lab Results:   Additional Information SSN: [id number].  Karn Cassis, LCSW

## 2020-12-06 DIAGNOSIS — E871 Hypo-osmolality and hyponatremia: Secondary | ICD-10-CM | POA: Diagnosis not present

## 2020-12-06 LAB — COMPREHENSIVE METABOLIC PANEL
ALT: 60 U/L — ABNORMAL HIGH (ref 0–44)
AST: 87 U/L — ABNORMAL HIGH (ref 15–41)
Albumin: 2.8 g/dL — ABNORMAL LOW (ref 3.5–5.0)
Alkaline Phosphatase: 85 U/L (ref 38–126)
Anion gap: 5 (ref 5–15)
BUN: 7 mg/dL — ABNORMAL LOW (ref 8–23)
CO2: 25 mmol/L (ref 22–32)
Calcium: 7.9 mg/dL — ABNORMAL LOW (ref 8.9–10.3)
Chloride: 98 mmol/L (ref 98–111)
Creatinine, Ser: 0.46 mg/dL — ABNORMAL LOW (ref 0.61–1.24)
GFR, Estimated: >60 mL/min (ref >60)
Glucose, Bld: 117 mg/dL — ABNORMAL HIGH (ref 70–99)
Potassium: 3.4 mmol/L — ABNORMAL LOW (ref 3.5–5.1)
Sodium: 128 mmol/L — ABNORMAL LOW (ref 135–145)
Total Bilirubin: 0.7 mg/dL (ref 0.3–1.2)
Total Protein: 6 g/dL — ABNORMAL LOW (ref 6.5–8.1)

## 2020-12-06 LAB — CBC
HCT: 34.9 % — ABNORMAL LOW (ref 39.0–52.0)
Hemoglobin: 12.1 g/dL — ABNORMAL LOW (ref 13.0–17.0)
MCH: 32.6 pg (ref 26.0–34.0)
MCHC: 34.7 g/dL (ref 30.0–36.0)
MCV: 94.1 fL (ref 80.0–100.0)
Platelets: 113 10*3/uL — ABNORMAL LOW (ref 150–400)
RBC: 3.71 MIL/uL — ABNORMAL LOW (ref 4.22–5.81)
RDW: 13.6 % (ref 11.5–15.5)
WBC: 4.4 10*3/uL (ref 4.0–10.5)
nRBC: 0 % (ref 0.0–0.2)

## 2020-12-06 LAB — MAGNESIUM: Magnesium: 1.9 mg/dL (ref 1.7–2.4)

## 2020-12-06 MED ORDER — SODIUM CHLORIDE 0.9 % IV SOLN: INTRAVENOUS | Status: DC

## 2020-12-06 MED ORDER — POTASSIUM CHLORIDE CRYS ER 20 MEQ PO TBCR
40.0000 meq | EXTENDED_RELEASE_TABLET | Freq: Once | ORAL | Status: AC
  Administered 2020-12-06: 40 meq via ORAL
  Filled 2020-12-06: qty 2

## 2020-12-06 NOTE — Plan of Care (Signed)
  Problem: Education: Goal: Knowledge of General Education information will improve Description: Including pain rating scale, medication(s)/side effects and non-pharmacologic comfort measures Outcome: Progressing   Problem: Clinical Measurements: Goal: Will remain free from infection Outcome: Progressing Goal: Diagnostic test results will improve Outcome: Progressing Goal: Respiratory complications will improve Outcome: Progressing   Problem: Activity: Goal: Risk for activity intolerance will decrease Outcome: Progressing   Problem: Nutrition: Goal: Adequate nutrition will be maintained Outcome: Progressing   Problem: Pain Managment: Goal: General experience of comfort will improve Outcome: Progressing   Problem: Safety: Goal: Ability to remain free from injury will improve Outcome: Progressing   Problem: Skin Integrity: Goal: Risk for impaired skin integrity will decrease Outcome: Progressing   

## 2020-12-06 NOTE — Progress Notes (Signed)
PROGRESS NOTE    Barry Fowler  QDI:[id number] DOB: 05/25/1955 DOA: 12/03/2020 PCP: Patient, No Pcp Per (Inactive)   Brief Narrative:   Barry Fowler is a 65 y.o. male with medical history significant for alcohol abuse, hypertension, hyponatremia. Patient was brought to the ED via EMS for reports of generalized weakness.  Patient has had good oral intake and apparently fell a few days ago and hit his head.  He drinks a 12 pack of beer every day.  Head CT unremarkable and patient was noted to have hyponatremia related to beer [place] mania that has been improving even without the use of IV normal saline.  PT evaluation recommending SNF which patient is agreeable to.  Sodium levels have stabilized.  Assessment & Plan:   Principal Problem:   Hyponatremia Active Problems:   Alcohol abuse   Abnormal LFTs (liver function tests)   Hypokalemia   Hyponatremia likely secondary to beer potomania-improving -Appears to be near baseline with chronic hyponatremia in the high 120 range -Continue brief dose of normal saline -Likely has a chronic element of hyponatremia -Repeat BMP as ordered -TSH 0.5 -Urine osmolarity is appropriately low -Okay for transfer to telemetry when bed available   Fall/alcohol abuse -CT head with no acute findings -PT evaluation revealing need for SNF -TOC assistance appreciated -CIWA protocol   Thrombocytopenia-improving -Likely secondary to alcohol use -Avoid heparin agents   Abnormal LFTs likely secondary to alcoholic liver disease -Downtrending -Fatty liver noted on abdominal ultrasound 10/21 -Acute hepatitis panel nonreactive     DVT prophylaxis: SCDs Code Status: Full Family Communication: Discussed with his sister on phone 9/8 Disposition Plan:  Status is: Inpatient   Remains inpatient appropriate because:Altered mental status, IV treatments appropriate due to intensity of illness or inability to take PO, and Inpatient level of care appropriate due to  severity of illness   Dispo: The patient is from: Home              Anticipated d/c is to: SNF              Patient currently is not medically stable to d/c.              Difficult to place patient No     Consultants:  None   Procedures:  See below   Antimicrobials:  None   Subjective: Patient seen and evaluated today with no new acute complaints or concerns. No acute concerns or events noted overnight. Awaiting SNF bed.  Objective: Vitals:   12/06/20 0820 12/06/20 0900 12/06/20 1000 12/06/20 1100  BP:      Pulse:      Resp: 14 (!) 25 (!) 25 15  Temp:      TempSrc:      SpO2:      Weight:      Height:        Intake/Output Summary (Last 24 hours) at 12/06/2020 1124 Last data filed at 12/06/2020 1009 Gross per 24 hour  Intake 267.36 ml  Output 1650 ml  Net -1382.64 ml   Filed Weights   12/03/20 1601 12/05/20 0450  Weight: 72.6 kg 67.1 kg    Examination:  General exam: Appears calm and comfortable  Respiratory system: Clear to auscultation. Respiratory effort normal. Cardiovascular system: S1 & S2 heard, RRR.  Gastrointestinal system: Abdomen is soft Central nervous system: Alert and awake Extremities: No edema Skin: No significant lesions noted Psychiatry: Flat affect.    Data Reviewed: I have personally reviewed following labs and imaging  studies  CBC: Recent Labs  Lab 12/03/20 1630 12/04/20 0231 12/05/20 0457 12/06/20 0312  WBC 5.3 5.7 4.8 4.4  NEUTROABS 3.4  --   --   --   HGB 14.0 13.9 12.5* 12.1*  HCT 37.8* 38.0* 35.5* 34.9*  MCV 87.7 89.8 91.5 94.1  PLT 78* 82* 96* 113*   Basic Metabolic Panel: Recent Labs  Lab 12/03/20 1849 12/03/20 2237 12/04/20 0701 12/04/20 0926 12/04/20 1337 12/05/20 0457 12/06/20 0312  NA  --    < > 122* 122* 120* 125* 128*  K  --    < > 3.9 3.5 3.7 3.1* 3.4*  CL  --    < > 87* 87* 87* 92* 98  CO2  --    < > 23 22 21* 27 25  GLUCOSE  --    < > 97 199* 239* 125* 117*  BUN  --    < > 5* 5* 7* 7* 7*   CREATININE  --    < > 0.56* 0.60* 0.64 0.46* 0.46*  CALCIUM  --    < > 8.4* 8.3* 7.7* 8.0* 7.9*  MG 1.6*  --   --   --   --  2.0 1.9  PHOS 2.4*  --   --   --   --   --   --    < > = values in this interval not displayed.   GFR: Estimated Creatinine Clearance: 89.7 mL/min (A) (by C-G formula based on SCr of 0.46 mg/dL (L)). Liver Function Tests: Recent Labs  Lab 12/03/20 1630 12/05/20 0457 12/06/20 0312  AST 121* 59* 87*  ALT 76* 53* 60*  ALKPHOS 81 80 85  BILITOT 1.2 0.8 0.7  PROT 6.7 5.8* 6.0*  ALBUMIN 3.3* 2.8* 2.8*   Recent Labs  Lab 12/03/20 1630  LIPASE 25   No results for input(s): AMMONIA in the last 168 hours. Coagulation Profile: Recent Labs  Lab 12/03/20 1630  INR 0.9   Cardiac Enzymes: No results for input(s): CKTOTAL, CKMB, CKMBINDEX, TROPONINI in the last 168 hours. BNP (last 3 results) No results for input(s): PROBNP in the last 8760 hours. HbA1C: No results for input(s): HGBA1C in the last 72 hours. CBG: Recent Labs  Lab 12/03/20 2035  GLUCAP 93   Lipid Profile: No results for input(s): CHOL, HDL, LDLCALC, TRIG, CHOLHDL, LDLDIRECT in the last 72 hours. Thyroid Function Tests: Recent Labs    12/03/20 1849  TSH 0.502   Anemia Panel: No results for input(s): VITAMINB12, FOLATE, FERRITIN, TIBC, IRON, RETICCTPCT in the last 72 hours. Sepsis Labs: Recent Labs  Lab 12/03/20 1630 12/03/20 1822  LATICACIDVEN 2.3* 2.2*    Recent Results (from the past 240 hour(s))  Resp Panel by RT-PCR (Flu A&B, Covid) Nasopharyngeal Swab     Status: None   Collection Time: 12/03/20  6:06 PM   Specimen: Nasopharyngeal Swab; Nasopharyngeal(NP) swabs in vial transport medium  Result Value Ref Range Status   SARS Coronavirus 2 by RT PCR NEGATIVE NEGATIVE Final    Comment: (NOTE) SARS-CoV-2 target nucleic acids are NOT DETECTED.  The SARS-CoV-2 RNA is generally detectable in upper respiratory specimens during the acute phase of infection. The  lowest concentration of SARS-CoV-2 viral copies this assay can detect is 138 copies/mL. A negative result does not preclude SARS-Cov-2 infection and should not be used as the sole basis for treatment or other patient management decisions. A negative result may occur with  improper specimen collection/handling, submission of specimen other than  nasopharyngeal swab, presence of viral mutation(s) within the areas targeted by this assay, and inadequate number of viral copies(<138 copies/mL). A negative result must be combined with clinical observations, patient history, and epidemiological information. The expected result is Negative.  Fact Sheet for Patients:  BloggerCourse.com  Fact Sheet for Healthcare Providers:  SeriousBroker.it  This test is no t yet approved or cleared by the [place] FDA and  has been authorized for detection and/or diagnosis of SARS-CoV-2 by FDA under an Emergency Use Authorization (EUA). This EUA will remain  in effect (meaning this test can be used) for the duration of the COVID-19 declaration under Section 564(b)(1) of the Act, 21 U.S.C.section 360bbb-3(b)(1), unless the authorization is terminated  or revoked sooner.       Influenza A by PCR NEGATIVE NEGATIVE Final   Influenza B by PCR NEGATIVE NEGATIVE Final    Comment: (NOTE) The Xpert Xpress SARS-CoV-2/FLU/RSV plus assay is intended as an aid in the diagnosis of influenza from Nasopharyngeal swab specimens and should not be used as a sole basis for treatment. Nasal washings and aspirates are unacceptable for Xpert Xpress SARS-CoV-2/FLU/RSV testing.  Fact Sheet for Patients: BloggerCourse.com  Fact Sheet for Healthcare Providers: SeriousBroker.it  This test is not yet approved or cleared by the [place] FDA and has been authorized for detection and/or diagnosis of SARS-CoV-2 by FDA under  an Emergency Use Authorization (EUA). This EUA will remain in effect (meaning this test can be used) for the duration of the COVID-19 declaration under Section 564(b)(1) of the Act, 21 U.S.C. section 360bbb-3(b)(1), unless the authorization is terminated or revoked.  Performed at [facility], [address], [place], [place] [address]   MRSA Next Gen by PCR, Nasal     Status: None   Collection Time: 12/03/20 11:09 PM   Specimen: Nasal Mucosa; Nasal Swab  Result Value Ref Range Status   MRSA by PCR Next Gen NOT DETECTED NOT DETECTED Final    Comment: (NOTE) The GeneXpert MRSA Assay (FDA approved for NASAL specimens only), is one component of a comprehensive MRSA colonization surveillance program. It is not intended to diagnose MRSA infection nor to guide or monitor treatment for MRSA infections. Test performance is not FDA approved in patients less than 51 years old. Performed at [facility], [address] [place], [place] [address]          Radiology Studies: No results found.      Scheduled Meds:  Chlorhexidine Gluconate Cloth  6 each Topical Daily   erythromycin   Left Eye Q8H   folic acid  1 mg Oral Daily   multivitamin with minerals  1 tablet Oral Daily   thiamine  100 mg Oral Daily   Or   thiamine  100 mg Intravenous Daily   Continuous Infusions:  sodium chloride       LOS: 3 days    Time spent: 35 minutes    Avaneesh Pepitone Hoover Brunette, DO [facility]  If 7PM-7AM, please contact night-coverage [email or web address] 12/06/2020, 11:24 AM

## 2020-12-06 NOTE — TOC Progression Note (Signed)
Transition of Care [facility]) - Progression Note    Patient Details  Name: Riaz Onorato MRN: [id number] Date of Birth: 07/04/1955  Transition of Care [facility]) CM/SW Contact  Barry Brunner, LCSW Phone Number: 12/06/2020, 4:05 PM  Clinical Narrative:    CSW contacted [facility] and [facility] to inquire about referral. Both facilities reported no Admission staff available on weekend. CSW faxed out to additional facilities.   Expected Discharge Plan: Home/Self Care Barriers to Discharge: Continued Medical Work up  Expected Discharge Plan and Services Expected Discharge Plan: Home/Self Care       Living arrangements for the past 2 months: Single Family Home                                       Social Determinants of Health (SDOH) Interventions    Readmission Risk Interventions No flowsheet data found.

## 2020-12-07 DIAGNOSIS — E871 Hypo-osmolality and hyponatremia: Secondary | ICD-10-CM | POA: Diagnosis not present

## 2020-12-07 LAB — COMPREHENSIVE METABOLIC PANEL
ALT: 74 U/L — ABNORMAL HIGH (ref 0–44)
AST: 92 U/L — ABNORMAL HIGH (ref 15–41)
Albumin: 3.1 g/dL — ABNORMAL LOW (ref 3.5–5.0)
Alkaline Phosphatase: 83 U/L (ref 38–126)
Anion gap: 5 (ref 5–15)
BUN: <5 mg/dL — ABNORMAL LOW (ref 8–23)
CO2: 25 mmol/L (ref 22–32)
Calcium: 8 mg/dL — ABNORMAL LOW (ref 8.9–10.3)
Chloride: 91 mmol/L — ABNORMAL LOW (ref 98–111)
Creatinine, Ser: 0.39 mg/dL — ABNORMAL LOW (ref 0.61–1.24)
GFR, Estimated: >60 mL/min (ref >60)
Glucose, Bld: 130 mg/dL — ABNORMAL HIGH (ref 70–99)
Potassium: 3.3 mmol/L — ABNORMAL LOW (ref 3.5–5.1)
Sodium: 121 mmol/L — ABNORMAL LOW (ref 135–145)
Total Bilirubin: 1 mg/dL (ref 0.3–1.2)
Total Protein: 6.4 g/dL — ABNORMAL LOW (ref 6.5–8.1)

## 2020-12-07 LAB — GLUCOSE, CAPILLARY: Glucose-Capillary: 136 mg/dL — ABNORMAL HIGH (ref 70–99)

## 2020-12-07 LAB — MAGNESIUM: Magnesium: 1.6 mg/dL — ABNORMAL LOW (ref 1.7–2.4)

## 2020-12-07 MED ORDER — POTASSIUM CHLORIDE CRYS ER 20 MEQ PO TBCR
40.0000 meq | EXTENDED_RELEASE_TABLET | Freq: Two times a day (BID) | ORAL | Status: AC
  Administered 2020-12-07 (×2): 40 meq via ORAL
  Filled 2020-12-07: qty 4
  Filled 2020-12-07: qty 2

## 2020-12-07 MED ORDER — MAGNESIUM SULFATE 2 GM/50ML IV SOLN
2.0000 g | Freq: Once | INTRAVENOUS | Status: AC
  Administered 2020-12-07: 2 g via INTRAVENOUS
  Filled 2020-12-07: qty 50

## 2020-12-07 NOTE — Progress Notes (Signed)
PROGRESS NOTE    Barry Fowler  VCB:[id number] DOB: 03-20-1956 DOA: 12/03/2020 PCP: Patient, No Pcp Per (Inactive)   Brief Narrative:   Barry Fowler is a 65 y.o. male with medical history significant for alcohol abuse, hypertension, hyponatremia. Patient was brought to the ED via EMS for reports of generalized weakness.  Patient has had good oral intake and apparently fell a few days ago and hit his head.  He drinks a 12 pack of beer every day.  Head CT unremarkable and patient was noted to have hyponatremia related to beer Poto mania that has been improving even without the use of IV normal saline.  PT evaluation recommending SNF which patient is agreeable to.  Sodium levels remain labile.  Assessment & Plan:   Principal Problem:   Hyponatremia Active Problems:   Alcohol abuse   Abnormal LFTs (liver function tests)   Hypokalemia   Hyponatremia likely secondary to beer potomania-labile -Appears to be near baseline with chronic hyponatremia in the high 120 range -Continue normal saline as serum sodium levels have dropped once again -Repeat BMP as ordered -TSH 0.5 -Urine osmolarity is appropriately low  Hypomagnesemia/hypokalemia -Replete and reevaluate in a.m.   Fall/alcohol abuse -CT head with no acute findings -PT evaluation revealing need for SNF -TOC assistance appreciated -CIWA protocol   Thrombocytopenia-improving -Likely secondary to alcohol use -Avoid heparin agents   Abnormal LFTs likely secondary to alcoholic liver disease -Downtrending -Fatty liver noted on abdominal ultrasound 10/21 -Acute hepatitis panel nonreactive     DVT prophylaxis: SCDs Code Status: Full Family Communication: Discussed with his sister on phone 9/8 Disposition Plan:  Status is: Inpatient   Remains inpatient appropriate because:Altered mental status, IV treatments appropriate due to intensity of illness or inability to take PO, and Inpatient level of care appropriate due to severity of  illness   Dispo: The patient is from: Home              Anticipated d/c is to: SNF              Patient currently is not medically stable to d/c.              Difficult to place patient No     Consultants:  None   Procedures:  See below   Antimicrobials:  None   Subjective: Patient seen and evaluated today with no new acute complaints or concerns. No acute concerns or events noted overnight.  Objective: Vitals:   12/06/20 1500 12/06/20 1900 12/06/20 2307 12/07/20 0307  BP: (!) 153/95 (!) 159/98 (!) 156/92 (!) 148/97  Pulse: 72 92 86 81  Resp: 20 18 20 18   Temp: 97.8 F (36.6 C) 98.6 F (37 C) 98.3 F (36.8 C) 98.2 F (36.8 C)  TempSrc: Oral Oral Oral Oral  SpO2: 100% 96% 97% 96%  Weight:      Height:        Intake/Output Summary (Last 24 hours) at 12/07/2020 1130 Last data filed at 12/07/2020 0847 Gross per 24 hour  Intake 914.89 ml  Output 2175 ml  Net -1260.11 ml   Filed Weights   12/03/20 1601 12/05/20 0450  Weight: 72.6 kg 67.1 kg    Examination:  General exam: Appears calm and comfortable  Respiratory system: Clear to auscultation. Respiratory effort normal. Cardiovascular system: S1 & S2 heard, RRR.  Gastrointestinal system: Abdomen is soft Central nervous system: Alert and awake Extremities: No edema Skin: No significant lesions noted Psychiatry: Flat affect.    Data Reviewed:  I have personally reviewed following labs and imaging studies  CBC: Recent Labs  Lab 12/03/20 1630 12/04/20 0231 12/05/20 0457 12/06/20 0312  WBC 5.3 5.7 4.8 4.4  NEUTROABS 3.4  --   --   --   HGB 14.0 13.9 12.5* 12.1*  HCT 37.8* 38.0* 35.5* 34.9*  MCV 87.7 89.8 91.5 94.1  PLT 78* 82* 96* 113*   Basic Metabolic Panel: Recent Labs  Lab 12/03/20 1849 12/03/20 2237 12/04/20 0926 12/04/20 1337 12/05/20 0457 12/06/20 0312 12/07/20 0525  NA  --    < > 122* 120* 125* 128* 121*  K  --    < > 3.5 3.7 3.1* 3.4* 3.3*  CL  --    < > 87* 87* 92* 98 91*  CO2  --     < > 22 21* 27 25 25   GLUCOSE  --    < > 199* 239* 125* 117* 130*  BUN  --    < > 5* 7* 7* 7* <5*  CREATININE  --    < > 0.60* 0.64 0.46* 0.46* 0.39*  CALCIUM  --    < > 8.3* 7.7* 8.0* 7.9* 8.0*  MG 1.6*  --   --   --  2.0 1.9 1.6*  PHOS 2.4*  --   --   --   --   --   --    < > = values in this interval not displayed.   GFR: Estimated Creatinine Clearance: 89.7 mL/min (A) (by C-G formula based on SCr of 0.39 mg/dL (L)). Liver Function Tests: Recent Labs  Lab 12/03/20 1630 12/05/20 0457 12/06/20 0312 12/07/20 0525  AST 121* 59* 87* 92*  ALT 76* 53* 60* 74*  ALKPHOS 81 80 85 83  BILITOT 1.2 0.8 0.7 1.0  PROT 6.7 5.8* 6.0* 6.4*  ALBUMIN 3.3* 2.8* 2.8* 3.1*   Recent Labs  Lab 12/03/20 1630  LIPASE 25   No results for input(s): AMMONIA in the last 168 hours. Coagulation Profile: Recent Labs  Lab 12/03/20 1630  INR 0.9   Cardiac Enzymes: No results for input(s): CKTOTAL, CKMB, CKMBINDEX, TROPONINI in the last 168 hours. BNP (last 3 results) No results for input(s): PROBNP in the last 8760 hours. HbA1C: No results for input(s): HGBA1C in the last 72 hours. CBG: Recent Labs  Lab 12/03/20 2035 12/07/20 0308  GLUCAP 93 136*   Lipid Profile: No results for input(s): CHOL, HDL, LDLCALC, TRIG, CHOLHDL, LDLDIRECT in the last 72 hours. Thyroid Function Tests: No results for input(s): TSH, T4TOTAL, FREET4, T3FREE, THYROIDAB in the last 72 hours. Anemia Panel: No results for input(s): VITAMINB12, FOLATE, FERRITIN, TIBC, IRON, RETICCTPCT in the last 72 hours. Sepsis Labs: Recent Labs  Lab 12/03/20 1630 12/03/20 1822  LATICACIDVEN 2.3* 2.2*    Recent Results (from the past 240 hour(s))  Resp Panel by RT-PCR (Flu A&B, Covid) Nasopharyngeal Swab     Status: None   Collection Time: 12/03/20  6:06 PM   Specimen: Nasopharyngeal Swab; Nasopharyngeal(NP) swabs in vial transport medium  Result Value Ref Range Status   SARS Coronavirus 2 by RT PCR NEGATIVE NEGATIVE Final     Comment: (NOTE) SARS-CoV-2 target nucleic acids are NOT DETECTED.  The SARS-CoV-2 RNA is generally detectable in upper respiratory specimens during the acute phase of infection. The lowest concentration of SARS-CoV-2 viral copies this assay can detect is 138 copies/mL. A negative result does not preclude SARS-Cov-2 infection and should not be used as the sole basis for treatment or  other patient management decisions. A negative result may occur with  improper specimen collection/handling, submission of specimen other than nasopharyngeal swab, presence of viral mutation(s) within the areas targeted by this assay, and inadequate number of viral copies(<138 copies/mL). A negative result must be combined with clinical observations, patient history, and epidemiological information. The expected result is Negative.  Fact Sheet for Patients:  BloggerCourse.com  Fact Sheet for Healthcare Providers:  SeriousBroker.it  This test is no t yet approved or cleared by the [place] FDA and  has been authorized for detection and/or diagnosis of SARS-CoV-2 by FDA under an Emergency Use Authorization (EUA). This EUA will remain  in effect (meaning this test can be used) for the duration of the COVID-19 declaration under Section 564(b)(1) of the Act, 21 U.S.C.section 360bbb-3(b)(1), unless the authorization is terminated  or revoked sooner.       Influenza A by PCR NEGATIVE NEGATIVE Final   Influenza B by PCR NEGATIVE NEGATIVE Final    Comment: (NOTE) The Xpert Xpress SARS-CoV-2/FLU/RSV plus assay is intended as an aid in the diagnosis of influenza from Nasopharyngeal swab specimens and should not be used as a sole basis for treatment. Nasal washings and aspirates are unacceptable for Xpert Xpress SARS-CoV-2/FLU/RSV testing.  Fact Sheet for Patients: BloggerCourse.com  Fact Sheet for Healthcare  Providers: SeriousBroker.it  This test is not yet approved or cleared by the [place] FDA and has been authorized for detection and/or diagnosis of SARS-CoV-2 by FDA under an Emergency Use Authorization (EUA). This EUA will remain in effect (meaning this test can be used) for the duration of the COVID-19 declaration under Section 564(b)(1) of the Act, 21 U.S.C. section 360bbb-3(b)(1), unless the authorization is terminated or revoked.  Performed at [facility], [address], [place], [place] [address]   MRSA Next Gen by PCR, Nasal     Status: None   Collection Time: 12/03/20 11:09 PM   Specimen: Nasal Mucosa; Nasal Swab  Result Value Ref Range Status   MRSA by PCR Next Gen NOT DETECTED NOT DETECTED Final    Comment: (NOTE) The GeneXpert MRSA Assay (FDA approved for NASAL specimens only), is one component of a comprehensive MRSA colonization surveillance program. It is not intended to diagnose MRSA infection nor to guide or monitor treatment for MRSA infections. Test performance is not FDA approved in patients less than 101 years old. Performed at [facility], [address] [place], [place] [address]          Radiology Studies: No results found.      Scheduled Meds:  Chlorhexidine Gluconate Cloth  6 each Topical Daily   erythromycin   Left Eye Q8H   folic acid  1 mg Oral Daily   multivitamin with minerals  1 tablet Oral Daily   potassium chloride  40 mEq Oral BID   thiamine  100 mg Oral Daily   Or   thiamine  100 mg Intravenous Daily   Continuous Infusions:  sodium chloride 75 mL/hr at 12/06/20 1201   magnesium sulfate bolus IVPB       LOS: 4 days    Time spent: 35 minutes    Jestina Stephani Hoover Brunette, DO [facility]  If 7PM-7AM, please contact night-coverage [email or web address] 12/07/2020, 11:30 AM

## 2020-12-07 NOTE — TOC Progression Note (Signed)
Transition of Care [facility]) - Progression Note    Patient Details  Name: Javonte Elenes MRN: [id number] Date of Birth: 05-01-1955  Transition of Care [facility]) CM/SW Contact  Barry Brunner, LCSW Phone Number: 12/07/2020, 3:34 PM  Clinical Narrative:    CSW received bed offer from [facility]. Patient agreeable to [facility]. CSW LVM with Wille Celeste notifying her of patients acceptance with facility and to request facility to start auth. TOC to follow.   Expected Discharge Plan: Home/Self Care Barriers to Discharge: Continued Medical Work up  Expected Discharge Plan and Services Expected Discharge Plan: Home/Self Care       Living arrangements for the past 2 months: Single Family Home                                       Social Determinants of Health (SDOH) Interventions    Readmission Risk Interventions No flowsheet data found.

## 2020-12-08 LAB — BASIC METABOLIC PANEL
Anion gap: 8 (ref 5–15)
BUN: 5 mg/dL — ABNORMAL LOW (ref 8–23)
CO2: 26 mmol/L (ref 22–32)
Calcium: 8.5 mg/dL — ABNORMAL LOW (ref 8.9–10.3)
Chloride: 91 mmol/L — ABNORMAL LOW (ref 98–111)
Creatinine, Ser: 0.47 mg/dL — ABNORMAL LOW (ref 0.61–1.24)
GFR, Estimated: >60 mL/min (ref >60)
Glucose, Bld: 112 mg/dL — ABNORMAL HIGH (ref 70–99)
Potassium: 3.7 mmol/L (ref 3.5–5.1)
Sodium: 125 mmol/L — ABNORMAL LOW (ref 135–145)

## 2020-12-08 LAB — MAGNESIUM: Magnesium: 2.1 mg/dL (ref 1.7–2.4)

## 2020-12-08 LAB — CBC
HCT: 37 % — ABNORMAL LOW (ref 39.0–52.0)
Hemoglobin: 12.9 g/dL — ABNORMAL LOW (ref 13.0–17.0)
MCH: 32.7 pg (ref 26.0–34.0)
MCHC: 34.9 g/dL (ref 30.0–36.0)
MCV: 93.9 fL (ref 80.0–100.0)
Platelets: 186 10*3/uL (ref 150–400)
RBC: 3.94 MIL/uL — ABNORMAL LOW (ref 4.22–5.81)
RDW: 13.4 % (ref 11.5–15.5)
WBC: 5.1 10*3/uL (ref 4.0–10.5)
nRBC: 0 % (ref 0.0–0.2)

## 2020-12-08 LAB — RESP PANEL BY RT-PCR (FLU A&B, COVID) ARPGX2
Influenza A by PCR: NEGATIVE
Influenza B by PCR: NEGATIVE
SARS Coronavirus 2 by RT PCR: NEGATIVE

## 2020-12-08 NOTE — TOC Progression Note (Signed)
Transition of Care [facility]) - Progression Note    Patient Details  Name: Barry Fowler MRN: [id number] Date of Birth: 01-10-1956  Transition of Care [facility]) CM/SW Contact  Karn Cassis, [place] Phone Number: 12/08/2020, 2:22 PM  Clinical Narrative:  LCSW left 3 voicemails for admissions at [facility] to confirm auth has been started. Requested COVID test in anticipation of d/c. TOC will follow up in AM with [facility].      Expected Discharge Plan: Home/Self Care Barriers to Discharge: Continued Medical Work up  Expected Discharge Plan and Services Expected Discharge Plan: Home/Self Care       Living arrangements for the past 2 months: Single Family Home                                       Social Determinants of Health (SDOH) Interventions    Readmission Risk Interventions No flowsheet data found.

## 2020-12-08 NOTE — Progress Notes (Signed)
Physical Therapy Treatment Patient Details Name: Barry Fowler MRN: [id number] DOB: 1955-08-23 Today's Date: 12/08/2020   History of Present Illness Barry Fowler is a 65 y.o. male brought to the ED via EMS for reports of generalized weakness; fell a few days ago and hit head. Head CT unremarkable and patient was noted to have hyponatremia related to beer [place] mania that has been improving even without the use of IV normal saline. PMH: alcohol abuse, hypertension, hyponatremia.    PT Comments    Pt tolerates LE exercise without complaints. Pt progressing with ambulation tolerance, but continues to demonstrate narrow BOS, drifting/veering in all directions requiring assistance to maneuver RW to avoid running into objects. Pt generally unsteady requiring min A to steady with mobility. Will continue to progress acute PT as able. Pt still in agreement to SNF recommending due to unsteadiness and assistance requiring with mobility.    Recommendations for follow up therapy are one component of a multi-disciplinary discharge planning process, led by the attending physician.  Recommendations may be updated based on patient status, additional functional criteria and insurance authorization.  Follow Up Recommendations  SNF;Supervision for mobility/OOB;Supervision - Intermittent     Equipment Recommendations  None recommended by PT    Recommendations for Other Services       Precautions / Restrictions Precautions Precautions: Fall Restrictions Weight Bearing Restrictions: No     Mobility  Bed Mobility Overal bed mobility: Needs Assistance Bed Mobility: Supine to Sit  Supine to sit: Supervision  General bed mobility comments: slow, labored movement    Transfers Overall transfer level: Needs assistance Equipment used: Rolling walker (2 wheeled) Transfers: Sit to/from Stand Sit to Stand: Min assist  General transfer comment: VCs for hand placement to power up, weight posterior with BLE  braced against bed, min A to steady  Ambulation/Gait Ambulation/Gait assistance: Min assist Gait Distance (Feet): 80 Feet Assistive device: Rolling walker (2 wheeled) Gait Pattern/deviations: Step-through pattern;Decreased stride length;Ataxic;Drifts right/left;Narrow base of support Gait velocity: decreased   General Gait Details: slow ataxic-like step progression with narrow BOS and occasional scissoring, drifts R/L requiring assistance to avoid bumping into external objects, frequent repositioning within RW frame, min A to steady due to generally unsteady   Stairs             Wheelchair Mobility    Modified Rankin (Stroke Patients Only)       Balance Overall balance assessment: Needs assistance Sitting-balance support: Feet supported;No upper extremity supported Sitting balance-Leahy Scale: Fair Sitting balance - Comments: seated EOB with slouched posture (rounded shoulders, posterior pelvic tilt)   Standing balance support: During functional activity;Bilateral upper extremity supported Standing balance-Leahy Scale: Poor Standing balance comment: reliant on UE support    Cognition Arousal/Alertness: Awake/alert Behavior During Therapy: Flat affect Overall Cognitive Status: Within Functional Limits for tasks assessed       Exercises General Exercises - Lower Extremity Long Arc Quad: Seated;AROM;Strengthening;Both;10 reps Hip Flexion/Marching: Seated;AROM;Strengthening;Both;10 reps    General Comments General comments (skin integrity, edema, etc.): HR 90-115 during session, denies pain or dizziness      Pertinent Vitals/Pain Pain Assessment: No/denies pain    Home Living                      Prior Function            PT Goals (current goals can now be found in the care plan section) Acute Rehab PT Goals Patient Stated Goal: return home with assist PT Goal Formulation:  With patient Time For Goal Achievement: 12/19/20 Potential to Achieve  Goals: Good Progress towards PT goals: Progressing toward goals    Frequency    Min 3X/week      PT Plan Current plan remains appropriate    Co-evaluation              AM-PAC PT "6 Clicks" Mobility   Outcome Measure  Help needed turning from your back to your side while in a flat bed without using bedrails?: None Help needed moving from lying on your back to sitting on the side of a flat bed without using bedrails?: A Little Help needed moving to and from a bed to a chair (including a wheelchair)?: A Little Help needed standing up from a chair using your arms (e.g., wheelchair or bedside chair)?: A Little Help needed to walk in hospital room?: A Little Help needed climbing 3-5 steps with a railing? : A Lot 6 Click Score: 18    End of Session Equipment Utilized During Treatment: Gait belt Activity Tolerance: Patient tolerated treatment well Patient left: in chair;with call bell/phone within reach;with chair alarm set;with family/visitor present Nurse Communication: Mobility status PT Visit Diagnosis: Unsteadiness on feet (R26.81);Other abnormalities of gait and mobility (R26.89);Muscle weakness (generalized) (M62.81)     Time: 6568-1275 PT Time Calculation (min) (ACUTE ONLY): 15 min  Charges:  $Gait Training: 8-22 mins                    Tori Abrey Bradway PT, DPT 12/08/20, 2:24 PM

## 2020-12-08 NOTE — Progress Notes (Signed)
PROGRESS NOTE    Barry Fowler  XIH:[id number] DOB: May 14, 1955 DOA: 12/03/2020 PCP: Patient, No Pcp Per (Inactive)   Brief Narrative:   Barry Fowler is a 65 y.o. male with medical history significant for alcohol abuse, hypertension, hyponatremia. Patient was brought to the ED via EMS for reports of generalized weakness.  Patient has had good oral intake and apparently fell a few days ago and hit his head.  He drinks a 12 pack of beer every day.  Head CT unremarkable and patient was noted to have hyponatremia related to beer Poto mania that has been improving even without the use of IV normal saline.  PT evaluation recommending SNF which patient is agreeable to.  Sodium levels remain labile.  Assessment & Plan:   Principal Problem:   Hyponatremia Active Problems:   Alcohol abuse   Abnormal LFTs (liver function tests)   Hypokalemia   Hyponatremia likely secondary to beer potomania-labile -Appears to be near baseline with chronic hyponatremia in the 120 range -Continue normal saline as serum sodium levels have dropped once again -Repeat BMP as ordered -TSH 0.5 -Urine osmolarity is appropriately low   Fall/alcohol abuse -CT head with no acute findings -PT evaluation revealing need for SNF -TOC assistance appreciated -CIWA protocol   Thrombocytopenia-improving -Likely secondary to alcohol use -Avoid heparin agents   Abnormal LFTs likely secondary to alcoholic liver disease -Downtrending -Fatty liver noted on abdominal ultrasound 10/21 -Acute hepatitis panel nonreactive     DVT prophylaxis: SCDs Code Status: Full Family Communication: Discussed with his sister on phone 9/8 Disposition Plan:  Status is: Inpatient   Remains inpatient appropriate because:Altered mental status, IV treatments appropriate due to intensity of illness or inability to take PO, and Inpatient level of care appropriate due to severity of illness   Dispo: The patient is from: Home               Anticipated d/c is to: SNF              Patient currently is not medically stable to d/c.              Difficult to place patient No     Consultants:  None   Procedures:  See below   Antimicrobials:  None  Subjective: Patient seen and evaluated today with no new acute complaints or concerns. No acute concerns or events noted overnight.  Objective: Vitals:   12/07/20 1448 12/07/20 2029 12/08/20 0500 12/08/20 1312  BP: 112/79 113/83 116/67 117/86  Pulse: 69 72 80 92  Resp: 20 18 18 18   Temp: 98.1 F (36.7 C) 98.4 F (36.9 C) 98 F (36.7 C) 98.4 F (36.9 C)  TempSrc: Oral   Oral  SpO2: 99% 97% 98% 94%  Weight:      Height:        Intake/Output Summary (Last 24 hours) at 12/08/2020 1404 Last data filed at 12/08/2020 1100 Gross per 24 hour  Intake 3053.38 ml  Output 2400 ml  Net 653.38 ml   Filed Weights   12/03/20 1601 12/05/20 0450  Weight: 72.6 kg 67.1 kg    Examination:  General exam: Appears calm and comfortable  Respiratory system: Clear to auscultation. Respiratory effort normal. Cardiovascular system: S1 & S2 heard, RRR.  Gastrointestinal system: Abdomen is soft Central nervous system: Alert and awake Extremities: No edema Skin: No significant lesions noted Psychiatry: Flat affect.    Data Reviewed: I have personally reviewed following labs and imaging studies  CBC: Recent Labs  Lab 12/03/20 1630 12/04/20 0231 12/05/20 0457 12/06/20 0312 12/08/20 0537  WBC 5.3 5.7 4.8 4.4 5.1  NEUTROABS 3.4  --   --   --   --   HGB 14.0 13.9 12.5* 12.1* 12.9*  HCT 37.8* 38.0* 35.5* 34.9* 37.0*  MCV 87.7 89.8 91.5 94.1 93.9  PLT 78* 82* 96* 113* 186   Basic Metabolic Panel: Recent Labs  Lab 12/03/20 1849 12/03/20 2237 12/04/20 1337 12/05/20 0457 12/06/20 0312 12/07/20 0525 12/08/20 0537  NA  --    < > 120* 125* 128* 121* 125*  K  --    < > 3.7 3.1* 3.4* 3.3* 3.7  CL  --    < > 87* 92* 98 91* 91*  CO2  --    < > 21* 27 25 25 26   GLUCOSE  --    <  > 239* 125* 117* 130* 112*  BUN  --    < > 7* 7* 7* <5* 5*  CREATININE  --    < > 0.64 0.46* 0.46* 0.39* 0.47*  CALCIUM  --    < > 7.7* 8.0* 7.9* 8.0* 8.5*  MG 1.6*  --   --  2.0 1.9 1.6* 2.1  PHOS 2.4*  --   --   --   --   --   --    < > = values in this interval not displayed.   GFR: Estimated Creatinine Clearance: 89.7 mL/min (A) (by C-G formula based on SCr of 0.47 mg/dL (L)). Liver Function Tests: Recent Labs  Lab 12/03/20 1630 12/05/20 0457 12/06/20 0312 12/07/20 0525  AST 121* 59* 87* 92*  ALT 76* 53* 60* 74*  ALKPHOS 81 80 85 83  BILITOT 1.2 0.8 0.7 1.0  PROT 6.7 5.8* 6.0* 6.4*  ALBUMIN 3.3* 2.8* 2.8* 3.1*   Recent Labs  Lab 12/03/20 1630  LIPASE 25   No results for input(s): AMMONIA in the last 168 hours. Coagulation Profile: Recent Labs  Lab 12/03/20 1630  INR 0.9   Cardiac Enzymes: No results for input(s): CKTOTAL, CKMB, CKMBINDEX, TROPONINI in the last 168 hours. BNP (last 3 results) No results for input(s): PROBNP in the last 8760 hours. HbA1C: No results for input(s): HGBA1C in the last 72 hours. CBG: Recent Labs  Lab 12/03/20 2035 12/07/20 0308  GLUCAP 93 136*   Lipid Profile: No results for input(s): CHOL, HDL, LDLCALC, TRIG, CHOLHDL, LDLDIRECT in the last 72 hours. Thyroid Function Tests: No results for input(s): TSH, T4TOTAL, FREET4, T3FREE, THYROIDAB in the last 72 hours. Anemia Panel: No results for input(s): VITAMINB12, FOLATE, FERRITIN, TIBC, IRON, RETICCTPCT in the last 72 hours. Sepsis Labs: Recent Labs  Lab 12/03/20 1630 12/03/20 1822  LATICACIDVEN 2.3* 2.2*    Recent Results (from the past 240 hour(s))  Resp Panel by RT-PCR (Flu A&B, Covid) Nasopharyngeal Swab     Status: None   Collection Time: 12/03/20  6:06 PM   Specimen: Nasopharyngeal Swab; Nasopharyngeal(NP) swabs in vial transport medium  Result Value Ref Range Status   SARS Coronavirus 2 by RT PCR NEGATIVE NEGATIVE Final    Comment: (NOTE) SARS-CoV-2 target  nucleic acids are NOT DETECTED.  The SARS-CoV-2 RNA is generally detectable in upper respiratory specimens during the acute phase of infection. The lowest concentration of SARS-CoV-2 viral copies this assay can detect is 138 copies/mL. A negative result does not preclude SARS-Cov-2 infection and should not be used as the sole basis for treatment or other patient management decisions. A negative  result may occur with  improper specimen collection/handling, submission of specimen other than nasopharyngeal swab, presence of viral mutation(s) within the areas targeted by this assay, and inadequate number of viral copies(<138 copies/mL). A negative result must be combined with clinical observations, patient history, and epidemiological information. The expected result is Negative.  Fact Sheet for Patients:  BloggerCourse.com  Fact Sheet for Healthcare Providers:  SeriousBroker.it  This test is no t yet approved or cleared by the [place] FDA and  has been authorized for detection and/or diagnosis of SARS-CoV-2 by FDA under an Emergency Use Authorization (EUA). This EUA will remain  in effect (meaning this test can be used) for the duration of the COVID-19 declaration under Section 564(b)(1) of the Act, 21 U.S.C.section 360bbb-3(b)(1), unless the authorization is terminated  or revoked sooner.       Influenza A by PCR NEGATIVE NEGATIVE Final   Influenza B by PCR NEGATIVE NEGATIVE Final    Comment: (NOTE) The Xpert Xpress SARS-CoV-2/FLU/RSV plus assay is intended as an aid in the diagnosis of influenza from Nasopharyngeal swab specimens and should not be used as a sole basis for treatment. Nasal washings and aspirates are unacceptable for Xpert Xpress SARS-CoV-2/FLU/RSV testing.  Fact Sheet for Patients: BloggerCourse.com  Fact Sheet for Healthcare  Providers: SeriousBroker.it  This test is not yet approved or cleared by the [place] FDA and has been authorized for detection and/or diagnosis of SARS-CoV-2 by FDA under an Emergency Use Authorization (EUA). This EUA will remain in effect (meaning this test can be used) for the duration of the COVID-19 declaration under Section 564(b)(1) of the Act, 21 U.S.C. section 360bbb-3(b)(1), unless the authorization is terminated or revoked.  Performed at [facility], [address] [place], [place] [address]   MRSA Next Gen by PCR, Nasal     Status: None   Collection Time: 12/03/20 11:09 PM   Specimen: Nasal Mucosa; Nasal Swab  Result Value Ref Range Status   MRSA by PCR Next Gen NOT DETECTED NOT DETECTED Final    Comment: (NOTE) The GeneXpert MRSA Assay (FDA approved for NASAL specimens only), is one component of a comprehensive MRSA colonization surveillance program. It is not intended to diagnose MRSA infection nor to guide or monitor treatment for MRSA infections. Test performance is not FDA approved in patients less than 52 years old. Performed at [facility], [address] [place], [place] [address]          Radiology Studies: No results found.      Scheduled Meds:  Chlorhexidine Gluconate Cloth  6 each Topical Daily   erythromycin   Left Eye Q8H   folic acid  1 mg Oral Daily   multivitamin with minerals  1 tablet Oral Daily   thiamine  100 mg Oral Daily   Or   thiamine  100 mg Intravenous Daily   Continuous Infusions:  sodium chloride 75 mL/hr at 12/08/20 1033     LOS: 5 days    Time spent: 35 minutes    Yalda Herd Hoover Brunette, DO [facility]  If 7PM-7AM, please contact night-coverage [email or web address] 12/08/2020, 2:04 PM

## 2020-12-08 NOTE — Care Management Important Message (Signed)
Important Message  Patient Details  Name: Barry Fowler MRN: [id number] Date of Birth: Sep 15, 1955   Medicare Important Message Given:  Yes     Corey Harold 12/08/2020, 12:17 PM

## 2020-12-09 LAB — BASIC METABOLIC PANEL
Anion gap: 9 (ref 5–15)
BUN: 9 mg/dL (ref 8–23)
CO2: 24 mmol/L (ref 22–32)
Calcium: 8.5 mg/dL — ABNORMAL LOW (ref 8.9–10.3)
Chloride: 94 mmol/L — ABNORMAL LOW (ref 98–111)
Creatinine, Ser: 0.48 mg/dL — ABNORMAL LOW (ref 0.61–1.24)
GFR, Estimated: >60 mL/min (ref >60)
Glucose, Bld: 108 mg/dL — ABNORMAL HIGH (ref 70–99)
Potassium: 3.9 mmol/L (ref 3.5–5.1)
Sodium: 127 mmol/L — ABNORMAL LOW (ref 135–145)

## 2020-12-09 NOTE — TOC Progression Note (Signed)
Transition of Care [facility]) - Progression Note    Patient Details  Name: Barry Fowler MRN: [id number] Date of Birth: 1955-05-05  Transition of Care [facility] CM/SW Contact  Annice Needy, LCSW Phone Number: 12/09/2020, 11:29 AM  Clinical Narrative:    Wille Celeste at [facility] advised that she is resubmitting documentation for authorization as incomplete documentation was previously sent while she was out.  Patient is fully vaccinated for COVID per his report. He received his vaccination in [place] at a [employer]. He has not received his booster. Wille Celeste advised of this information.   Expected Discharge Plan: Home/Self Care Barriers to Discharge: Continued Medical Work up, Conservator, museum/gallery and Services Expected Discharge Plan: Home/Self Care       Living arrangements for the past 2 months: Single Family Home                                       Social Determinants of Health (SDOH) Interventions    Readmission Risk Interventions No flowsheet data found.

## 2020-12-09 NOTE — Progress Notes (Signed)
PROGRESS NOTE    Barry Fowler  WPY:[id number] DOB: December 27, 1955 DOA: 12/03/2020 PCP: Patient, No Pcp Per (Inactive)   Brief Narrative:   Barry Fowler is a 65 y.o. male with medical history significant for alcohol abuse, hypertension, hyponatremia. Patient was brought to the ED via EMS for reports of generalized weakness.  Patient has had good oral intake and apparently fell a few days ago and hit his head.  He drinks a 12 pack of beer every day.  Head CT unremarkable and patient was noted to have hyponatremia related to beer Poto mania that has been improving even without the use of IV normal saline.  PT evaluation recommending SNF which patient is agreeable to.  Sodium levels remain labile, but at baseline. Patient is awaiting SNF placement.  Assessment & Plan:   Principal Problem:   Hyponatremia Active Problems:   Alcohol abuse   Abnormal LFTs (liver function tests)   Hypokalemia   Hyponatremia likely secondary to beer potomania-labile -Appears to be near baseline with chronic hyponatremia in the 120 range -Continue normal saline as serum sodium levels have dropped once again -TSH 0.5 -Urine osmolarity is appropriately low   Fall/alcohol abuse -CT head with no acute findings -PT evaluation revealing need for SNF -TOC assistance appreciated -CIWA protocol   Thrombocytopenia-improving -Likely secondary to alcohol use -Avoid heparin agents   Abnormal LFTs likely secondary to alcoholic liver disease -Stable -Fatty liver noted on abdominal ultrasound 10/21 -Acute hepatitis panel nonreactive     DVT prophylaxis: SCDs Code Status: Full Family Communication: Discussed with his sister on phone 9/8 Disposition Plan:  Status is: Inpatient   Remains inpatient appropriate because:Altered mental status, IV treatments appropriate due to intensity of illness or inability to take PO, and Inpatient level of care appropriate due to severity of illness   Dispo: The patient is from: Home               Anticipated d/c is to: SNF              Patient currently is not medically stable to d/c.              Difficult to place patient No     Consultants:  None   Procedures:  See below   Antimicrobials:  None  Subjective: Patient seen and evaluated today with no new acute complaints or concerns. No acute concerns or events noted overnight.  Objective: Vitals:   12/08/20 1312 12/08/20 1437 12/08/20 2020 12/09/20 0455  BP: 117/86 105/81 109/81 125/82  Pulse: 92 97 89 70  Resp: 18 20 20 20   Temp: 98.4 F (36.9 C) 98.3 F (36.8 C) 97.8 F (36.6 C) 98.9 F (37.2 C)  TempSrc: Oral  Oral Oral  SpO2: 94% 96% 99% 95%  Weight:      Height:        Intake/Output Summary (Last 24 hours) at 12/09/2020 1345 Last data filed at 12/09/2020 0900 Gross per 24 hour  Intake 1314.19 ml  Output 600 ml  Net 714.19 ml   Filed Weights   12/03/20 1601 12/05/20 0450  Weight: 72.6 kg 67.1 kg    Examination:  General exam: Appears calm and comfortable  Respiratory system: Clear to auscultation. Respiratory effort normal. Cardiovascular system: S1 & S2 heard, RRR.  Gastrointestinal system: Abdomen is soft Central nervous system: Alert and awake Extremities: No edema Skin: No significant lesions noted Psychiatry: Flat affect.    Data Reviewed: I have personally reviewed following labs and imaging studies  CBC: Recent Labs  Lab 12/03/20 1630 12/04/20 0231 12/05/20 0457 12/06/20 0312 12/08/20 0537  WBC 5.3 5.7 4.8 4.4 5.1  NEUTROABS 3.4  --   --   --   --   HGB 14.0 13.9 12.5* 12.1* 12.9*  HCT 37.8* 38.0* 35.5* 34.9* 37.0*  MCV 87.7 89.8 91.5 94.1 93.9  PLT 78* 82* 96* 113* 186   Basic Metabolic Panel: Recent Labs  Lab 12/03/20 1849 12/03/20 2237 12/05/20 0457 12/06/20 0312 12/07/20 0525 12/08/20 0537 12/09/20 0622  NA  --    < > 125* 128* 121* 125* 127*  K  --    < > 3.1* 3.4* 3.3* 3.7 3.9  CL  --    < > 92* 98 91* 91* 94*  CO2  --    < > 27 25 25 26 24    GLUCOSE  --    < > 125* 117* 130* 112* 108*  BUN  --    < > 7* 7* <5* 5* 9  CREATININE  --    < > 0.46* 0.46* 0.39* 0.47* 0.48*  CALCIUM  --    < > 8.0* 7.9* 8.0* 8.5* 8.5*  MG 1.6*  --  2.0 1.9 1.6* 2.1  --   PHOS 2.4*  --   --   --   --   --   --    < > = values in this interval not displayed.   GFR: Estimated Creatinine Clearance: 89.7 mL/min (A) (by C-G formula based on SCr of 0.48 mg/dL (L)). Liver Function Tests: Recent Labs  Lab 12/03/20 1630 12/05/20 0457 12/06/20 0312 12/07/20 0525  AST 121* 59* 87* 92*  ALT 76* 53* 60* 74*  ALKPHOS 81 80 85 83  BILITOT 1.2 0.8 0.7 1.0  PROT 6.7 5.8* 6.0* 6.4*  ALBUMIN 3.3* 2.8* 2.8* 3.1*   Recent Labs  Lab 12/03/20 1630  LIPASE 25   No results for input(s): AMMONIA in the last 168 hours. Coagulation Profile: Recent Labs  Lab 12/03/20 1630  INR 0.9   Cardiac Enzymes: No results for input(s): CKTOTAL, CKMB, CKMBINDEX, TROPONINI in the last 168 hours. BNP (last 3 results) No results for input(s): PROBNP in the last 8760 hours. HbA1C: No results for input(s): HGBA1C in the last 72 hours. CBG: Recent Labs  Lab 12/03/20 2035 12/07/20 0308  GLUCAP 93 136*   Lipid Profile: No results for input(s): CHOL, HDL, LDLCALC, TRIG, CHOLHDL, LDLDIRECT in the last 72 hours. Thyroid Function Tests: No results for input(s): TSH, T4TOTAL, FREET4, T3FREE, THYROIDAB in the last 72 hours. Anemia Panel: No results for input(s): VITAMINB12, FOLATE, FERRITIN, TIBC, IRON, RETICCTPCT in the last 72 hours. Sepsis Labs: Recent Labs  Lab 12/03/20 1630 12/03/20 1822  LATICACIDVEN 2.3* 2.2*    Recent Results (from the past 240 hour(s))  Resp Panel by RT-PCR (Flu A&B, Covid) Nasopharyngeal Swab     Status: None   Collection Time: 12/03/20  6:06 PM   Specimen: Nasopharyngeal Swab; Nasopharyngeal(NP) swabs in vial transport medium  Result Value Ref Range Status   SARS Coronavirus 2 by RT PCR NEGATIVE NEGATIVE Final    Comment:  (NOTE) SARS-CoV-2 target nucleic acids are NOT DETECTED.  The SARS-CoV-2 RNA is generally detectable in upper respiratory specimens during the acute phase of infection. The lowest concentration of SARS-CoV-2 viral copies this assay can detect is 138 copies/mL. A negative result does not preclude SARS-Cov-2 infection and should not be used as the sole basis for treatment or other patient  management decisions. A negative result may occur with  improper specimen collection/handling, submission of specimen other than nasopharyngeal swab, presence of viral mutation(s) within the areas targeted by this assay, and inadequate number of viral copies(<138 copies/mL). A negative result must be combined with clinical observations, patient history, and epidemiological information. The expected result is Negative.  Fact Sheet for Patients:  BloggerCourse.com  Fact Sheet for Healthcare Providers:  SeriousBroker.it  This test is no t yet approved or cleared by the [place] FDA and  has been authorized for detection and/or diagnosis of SARS-CoV-2 by FDA under an Emergency Use Authorization (EUA). This EUA will remain  in effect (meaning this test can be used) for the duration of the COVID-19 declaration under Section 564(b)(1) of the Act, 21 U.S.C.section 360bbb-3(b)(1), unless the authorization is terminated  or revoked sooner.       Influenza A by PCR NEGATIVE NEGATIVE Final   Influenza B by PCR NEGATIVE NEGATIVE Final    Comment: (NOTE) The Xpert Xpress SARS-CoV-2/FLU/RSV plus assay is intended as an aid in the diagnosis of influenza from Nasopharyngeal swab specimens and should not be used as a sole basis for treatment. Nasal washings and aspirates are unacceptable for Xpert Xpress SARS-CoV-2/FLU/RSV testing.  Fact Sheet for Patients: BloggerCourse.com  Fact Sheet for Healthcare  Providers: SeriousBroker.it  This test is not yet approved or cleared by the [place] FDA and has been authorized for detection and/or diagnosis of SARS-CoV-2 by FDA under an Emergency Use Authorization (EUA). This EUA will remain in effect (meaning this test can be used) for the duration of the COVID-19 declaration under Section 564(b)(1) of the Act, 21 U.S.C. section 360bbb-3(b)(1), unless the authorization is terminated or revoked.  Performed at [facility], [address] [place], [place] [address]   MRSA Next Gen by PCR, Nasal     Status: None   Collection Time: 12/03/20 11:09 PM   Specimen: Nasal Mucosa; Nasal Swab  Result Value Ref Range Status   MRSA by PCR Next Gen NOT DETECTED NOT DETECTED Final    Comment: (NOTE) The GeneXpert MRSA Assay (FDA approved for NASAL specimens only), is one component of a comprehensive MRSA colonization surveillance program. It is not intended to diagnose MRSA infection nor to guide or monitor treatment for MRSA infections. Test performance is not FDA approved in patients less than 69 years old. Performed at [facility], [address], [place], [place] [address]   Resp Panel by RT-PCR (Flu A&B, Covid) Nasopharyngeal Swab     Status: None   Collection Time: 12/08/20  1:24 PM   Specimen: Nasopharyngeal Swab; Nasopharyngeal(NP) swabs in vial transport medium  Result Value Ref Range Status   SARS Coronavirus 2 by RT PCR NEGATIVE NEGATIVE Final    Comment: (NOTE) SARS-CoV-2 target nucleic acids are NOT DETECTED.  The SARS-CoV-2 RNA is generally detectable in upper respiratory specimens during the acute phase of infection. The lowest concentration of SARS-CoV-2 viral copies this assay can detect is 138 copies/mL. A negative result does not preclude SARS-Cov-2 infection and should not be used as the sole basis for treatment or other patient management decisions. A negative result may occur with  improper specimen  collection/handling, submission of specimen other than nasopharyngeal swab, presence of viral mutation(s) within the areas targeted by this assay, and inadequate number of viral copies(<138 copies/mL). A negative result must be combined with clinical observations, patient history, and epidemiological information. The expected result is Negative.  Fact Sheet for Patients:  BloggerCourse.com  Fact Sheet for Healthcare Providers:  SeriousBroker.it  This test is no t yet approved or cleared by the [place] FDA and  has been authorized for detection and/or diagnosis of SARS-CoV-2 by FDA under an Emergency Use Authorization (EUA). This EUA will remain  in effect (meaning this test can be used) for the duration of the COVID-19 declaration under Section 564(b)(1) of the Act, 21 U.S.C.section 360bbb-3(b)(1), unless the authorization is terminated  or revoked sooner.       Influenza A by PCR NEGATIVE NEGATIVE Final   Influenza B by PCR NEGATIVE NEGATIVE Final    Comment: (NOTE) The Xpert Xpress SARS-CoV-2/FLU/RSV plus assay is intended as an aid in the diagnosis of influenza from Nasopharyngeal swab specimens and should not be used as a sole basis for treatment. Nasal washings and aspirates are unacceptable for Xpert Xpress SARS-CoV-2/FLU/RSV testing.  Fact Sheet for Patients: BloggerCourse.com  Fact Sheet for Healthcare Providers: SeriousBroker.it  This test is not yet approved or cleared by the [place] FDA and has been authorized for detection and/or diagnosis of SARS-CoV-2 by FDA under an Emergency Use Authorization (EUA). This EUA will remain in effect (meaning this test can be used) for the duration of the COVID-19 declaration under Section 564(b)(1) of the Act, 21 U.S.C. section 360bbb-3(b)(1), unless the authorization is terminated or revoked.  Performed at [facility], [address], [place], [place] [address]          Radiology Studies: No results found.      Scheduled Meds:  erythromycin   Left Eye Q8H   folic acid  1 mg Oral Daily   multivitamin with minerals  1 tablet Oral Daily   thiamine  100 mg Oral Daily   Or   thiamine  100 mg Intravenous Daily   Continuous Infusions:  sodium chloride 75 mL/hr at 12/08/20 1033     LOS: 6 days    Time spent: 35 minutes    Aamirah Salmi Hoover Brunette, DO [facility]  If 7PM-7AM, please contact night-coverage [email or web address] 12/09/2020, 1:45 PM

## 2020-12-10 DIAGNOSIS — Z789 Other specified health status: Secondary | ICD-10-CM

## 2020-12-10 MED ORDER — FOLIC ACID 1 MG PO TABS: 1.0000 mg | ORAL_TABLET | Freq: Every day | ORAL | Status: AC

## 2020-12-10 MED ORDER — POLYETHYLENE GLYCOL 3350 17 G PO PACK: 17.0000 g | PACK | Freq: Every day | ORAL | 0 refills | Status: AC | PRN

## 2020-12-10 NOTE — Discharge Summary (Signed)
Physician Discharge Summary  Barry Fowler YSA:[id number] DOB: April 13, 1955 DOA: 12/03/2020  Admit date: 12/03/2020 Discharge date: 12/10/2020  Disposition:  SNF   Recommendations for Outpatient Follow-up:  Follow up with PCP in 2 weeks Please obtain BMP in one week to follow up sodium  Avoid all alcoholic beverages  Discharge Condition: STABLE   CODE STATUS: FULL DIET: regular    Brief Hospitalization Summary: Please see all hospital notes, images, labs for full details of the hospitalization. ADMISSION HPI: Barry Fowler is a 65 y.o. male with medical history significant for alcohol abuse, hypertension, hyponatremia. Patient was brought to the ED via EMS for reports of generalized weakness.  Patient's family called EMS.  Weakness has been ongoing for several days.  He reports good oral intake.  He denies nausea, no vomiting , no significant diarrhea.  No seizures.  He denies confusion.  Reports he fell a few days ago and reports that he hit his head.   Patient drinks at about 12 pack of beer every day.  His last drink was yesterday afternoon at about 4 PM.   ED Course: Stable vitals.  Sodium 111.  Potassium 3.1.  Urine sodium 16.  Mild elevation in liver enzymes AST 121, ALT 76.  Lactic acidosis of 2.3.  UA not suggestive of infection, positive for ketones.  Chest x-ray unremarkable.  Normal saline 100 cc/h started.  Hospitalist to admit.  HOSPITAL COURSE BY PROBLEM LIST   Hyponatremia likely secondary to beer potomania-labile -Appears to be near baseline with chronic hyponatremia in the 120 range -Continue normal saline as serum sodium levels have dropped once again -TSH 0.5 -Urine osmolarity is appropriately low -Sodium improved to 127, recommend rechecking BMP in 1 week   Fall/alcohol abuse -CT head with no acute findings -PT evaluation revealing need for SNF -TOC assistance appreciated -CIWA protocol followed in hospital   Thrombocytopenia-improving/stable  -Likely secondary to  chonic alcohol abuse -Avoid heparin agents   Abnormal LFTs likely secondary to alcoholic liver disease -Stable -Fatty liver noted on abdominal ultrasound 10/21 -Acute hepatitis panel nonreactive     DVT prophylaxis: SCDs Code Status: Full Family Communication: Discussed with his sister on phone  Disposition Plan: SNF  Discharge Diagnoses:  Principal Problem:   Hyponatremia Active Problems:   Alcohol abuse   Abnormal LFTs (liver function tests)   Hypokalemia   Beer potomania   Discharge Instructions:  Allergies as of 12/10/2020   No Known Allergies      Medication List     STOP taking these medications    acetaminophen 500 MG tablet Commonly known as: TYLENOL   losartan 25 MG tablet Commonly known as: COZAAR   meloxicam 15 MG tablet Commonly known as: MOBIC       TAKE these medications    folic acid 1 MG tablet Commonly known as: FOLVITE Take 1 tablet (1 mg total) by mouth daily. Start taking on: December 11, 2020   multivitamin with minerals Tabs tablet Take 1 tablet by mouth daily.   pantoprazole 40 MG tablet Commonly known as: Protonix Take 1 tablet (40 mg total) by mouth 2 (two) times daily.   polyethylene glycol 17 g packet Commonly known as: MIRALAX / GLYCOLAX Take 17 g by mouth daily as needed for mild constipation.   pravastatin 20 MG tablet Commonly known as: PRAVACHOL Take 20 mg by mouth daily.   thiamine 100 MG tablet Take 1 tablet (100 mg total) by mouth daily.        No Known  Allergies Allergies as of 12/10/2020   No Known Allergies      Medication List     STOP taking these medications    acetaminophen 500 MG tablet Commonly known as: TYLENOL   losartan 25 MG tablet Commonly known as: COZAAR   meloxicam 15 MG tablet Commonly known as: MOBIC       TAKE these medications    folic acid 1 MG tablet Commonly known as: FOLVITE Take 1 tablet (1 mg total) by mouth daily. Start taking on: December 11, 2020    multivitamin with minerals Tabs tablet Take 1 tablet by mouth daily.   pantoprazole 40 MG tablet Commonly known as: Protonix Take 1 tablet (40 mg total) by mouth 2 (two) times daily.   polyethylene glycol 17 g packet Commonly known as: MIRALAX / GLYCOLAX Take 17 g by mouth daily as needed for mild constipation.   pravastatin 20 MG tablet Commonly known as: PRAVACHOL Take 20 mg by mouth daily.   thiamine 100 MG tablet Take 1 tablet (100 mg total) by mouth daily.        Procedures/Studies: CT HEAD WO CONTRAST ( )  Result Date: 12/03/2020 CLINICAL DATA:  Fall, hit head EXAM: CT HEAD WITHOUT CONTRAST TECHNIQUE: Contiguous axial images were obtained from the base of the skull through the vertex without intravenous contrast. COMPARISON:  01/11/2020 FINDINGS: Brain: There is atrophy and chronic small vessel disease changes. No acute intracranial abnormality. Specifically, no hemorrhage, hydrocephalus, mass lesion, acute infarction, or significant intracranial injury. Vascular: No hyperdense vessel or unexpected calcification. Skull: No acute calvarial abnormality. Sinuses/Orbits: Visualized paranasal sinuses and mastoids clear. Orbital soft tissues unremarkable. Other: 90 IMPRESSION: Atrophy, chronic microvascular disease. No acute intracranial abnormality. Electronically Signed   By: Charlett Nose M.D.   On: 12/03/2020 19:51   DG Chest Port 1 View  Result Date: 12/03/2020 CLINICAL DATA:  Weakness, fell several days ago, history hypertension EXAM: PORTABLE CHEST 1 VIEW COMPARISON:  Portable exam 1612 hours compared to 01/11/2020 FINDINGS: Normal heart size, mediastinal contours, and pulmonary vascularity. Atherosclerotic calcification aorta. Emphysematous and bronchitic changes question COPD. Lungs clear. No pulmonary infiltrate, pleural effusion, or pneumothorax. Osseous demineralization. 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 Aortic Atherosclerosis (ICD10-I70.0) and Emphysema  (ICD10-J43.9). Electronically Signed   By: Ulyses Southward M.D.   On: 12/03/2020 16:40     Subjective: Pt reports that he is feeling much better, he is agreeable to SNF rehab.    Discharge Exam: Vitals:   12/09/20 2147 12/10/20 0421  BP: 107/69 (!) 149/87  Pulse: 79 71  Resp: 20 20  Temp: 98.8 F (37.1 C) 98.2 F (36.8 C)  SpO2: 95% 95%   Vitals:   12/09/20 0455 12/09/20 1436 12/09/20 2147 12/10/20 0421  BP: 125/82 130/78 107/69 (!) 149/87  Pulse: 70 74 79 71  Resp: 20 19 20 20   Temp: 98.9 F (37.2 C) 98.5 F (36.9 C) 98.8 F (37.1 C) 98.2 F (36.8 C)  TempSrc: Oral Oral Oral   SpO2: 95% 96% 95% 95%  Weight:      Height:       General: Pt is alert, awake, not in acute distress Cardiovascular: RRR, S1/S2 +, no rubs, no gallops Respiratory: CTA bilaterally, no wheezing, no rhonchi Abdominal: Soft, NT, ND, bowel sounds + Extremities: no edema, no cyanosis   The results of significant diagnostics from this hospitalization (including imaging, microbiology, ancillary and laboratory) are listed below for reference.     Microbiology: Recent Results (from the past 240  hour(s))  Resp Panel by RT-PCR (Flu A&B, Covid) Nasopharyngeal Swab     Status: None   Collection Time: 12/03/20  6:06 PM   Specimen: Nasopharyngeal Swab; Nasopharyngeal(NP) swabs in vial transport medium  Result Value Ref Range Status   SARS Coronavirus 2 by RT PCR NEGATIVE NEGATIVE Final    Comment: (NOTE) SARS-CoV-2 target nucleic acids are NOT DETECTED.  The SARS-CoV-2 RNA is generally detectable in upper respiratory specimens during the acute phase of infection. The lowest concentration of SARS-CoV-2 viral copies this assay can detect is 138 copies/mL. A negative result does not preclude SARS-Cov-2 infection and should not be used as the sole basis for treatment or other patient management decisions. A negative result may occur with  improper specimen collection/handling, submission of specimen  other than nasopharyngeal swab, presence of viral mutation(s) within the areas targeted by this assay, and inadequate number of viral copies(<138 copies/mL). A negative result must be combined with clinical observations, patient history, and epidemiological information. The expected result is Negative.  Fact Sheet for Patients:  BloggerCourse.com  Fact Sheet for Healthcare Providers:  SeriousBroker.it  This test is no t yet approved or cleared by the [place] FDA and  has been authorized for detection and/or diagnosis of SARS-CoV-2 by FDA under an Emergency Use Authorization (EUA). This EUA will remain  in effect (meaning this test can be used) for the duration of the COVID-19 declaration under Section 564(b)(1) of the Act, 21 U.S.C.section 360bbb-3(b)(1), unless the authorization is terminated  or revoked sooner.       Influenza A by PCR NEGATIVE NEGATIVE Final   Influenza B by PCR NEGATIVE NEGATIVE Final    Comment: (NOTE) The Xpert Xpress SARS-CoV-2/FLU/RSV plus assay is intended as an aid in the diagnosis of influenza from Nasopharyngeal swab specimens and should not be used as a sole basis for treatment. Nasal washings and aspirates are unacceptable for Xpert Xpress SARS-CoV-2/FLU/RSV testing.  Fact Sheet for Patients: BloggerCourse.com  Fact Sheet for Healthcare Providers: SeriousBroker.it  This test is not yet approved or cleared by the [place] FDA and has been authorized for detection and/or diagnosis of SARS-CoV-2 by FDA under an Emergency Use Authorization (EUA). This EUA will remain in effect (meaning this test can be used) for the duration of the COVID-19 declaration under Section 564(b)(1) of the Act, 21 U.S.C. section 360bbb-3(b)(1), unless the authorization is terminated or revoked.  Performed at [facility], [address] [place], [place]  [address]   MRSA Next Gen by PCR, Nasal     Status: None   Collection Time: 12/03/20 11:09 PM   Specimen: Nasal Mucosa; Nasal Swab  Result Value Ref Range Status   MRSA by PCR Next Gen NOT DETECTED NOT DETECTED Final    Comment: (NOTE) The GeneXpert MRSA Assay (FDA approved for NASAL specimens only), is one component of a comprehensive MRSA colonization surveillance program. It is not intended to diagnose MRSA infection nor to guide or monitor treatment for MRSA infections. Test performance is not FDA approved in patients less than 42 years old. Performed at [facility], [address] [place], [place] [address]   Resp Panel by RT-PCR (Flu A&B, Covid) Nasopharyngeal Swab     Status: None   Collection Time: 12/08/20  1:24 PM   Specimen: Nasopharyngeal Swab; Nasopharyngeal(NP) swabs in vial transport medium  Result Value Ref Range Status   SARS Coronavirus 2 by RT PCR NEGATIVE NEGATIVE Final    Comment: (NOTE) SARS-CoV-2 target nucleic acids are  NOT DETECTED.  The SARS-CoV-2 RNA is generally detectable in upper respiratory specimens during the acute phase of infection. The lowest concentration of SARS-CoV-2 viral copies this assay can detect is 138 copies/mL. A negative result does not preclude SARS-Cov-2 infection and should not be used as the sole basis for treatment or other patient management decisions. A negative result may occur with  improper specimen collection/handling, submission of specimen other than nasopharyngeal swab, presence of viral mutation(s) within the areas targeted by this assay, and inadequate number of viral copies(<138 copies/mL). A negative result must be combined with clinical observations, patient history, and epidemiological information. The expected result is Negative.  Fact Sheet for Patients:  BloggerCourse.com  Fact Sheet for Healthcare Providers:  SeriousBroker.it  This test is no t yet approved  or cleared by the [place] FDA and  has been authorized for detection and/or diagnosis of SARS-CoV-2 by FDA under an Emergency Use Authorization (EUA). This EUA will remain  in effect (meaning this test can be used) for the duration of the COVID-19 declaration under Section 564(b)(1) of the Act, 21 U.S.C.section 360bbb-3(b)(1), unless the authorization is terminated  or revoked sooner.       Influenza A by PCR NEGATIVE NEGATIVE Final   Influenza B by PCR NEGATIVE NEGATIVE Final    Comment: (NOTE) The Xpert Xpress SARS-CoV-2/FLU/RSV plus assay is intended as an aid in the diagnosis of influenza from Nasopharyngeal swab specimens and should not be used as a sole basis for treatment. Nasal washings and aspirates are unacceptable for Xpert Xpress SARS-CoV-2/FLU/RSV testing.  Fact Sheet for Patients: BloggerCourse.com  Fact Sheet for Healthcare Providers: SeriousBroker.it  This test is not yet approved or cleared by the [place] FDA and has been authorized for detection and/or diagnosis of SARS-CoV-2 by FDA under an Emergency Use Authorization (EUA). This EUA will remain in effect (meaning this test can be used) for the duration of the COVID-19 declaration under Section 564(b)(1) of the Act, 21 U.S.C. section 360bbb-3(b)(1), unless the authorization is terminated or revoked.  Performed at [facility], [address] [place], [place] [address]      Labs: BNP (last 3 results) Recent Labs    12/03/20 1630  BNP 25.0   Basic Metabolic Panel: Recent Labs  Lab 12/03/20 1849 12/03/20 2237 12/05/20 0457 12/06/20 0312 12/07/20 0525 12/08/20 0537 12/09/20 0622  NA  --    < > 125* 128* 121* 125* 127*  K  --    < > 3.1* 3.4* 3.3* 3.7 3.9  CL  --    < > 92* 98 91* 91* 94*  CO2  --    < > GLUCOSE  --    < > 125* 117* 130* 112* 108*  BUN  --    < > 7* 7* <5* 5* 9  CREATININE  --    < > 0.46* 0.46* 0.39*  0.47* 0.48*  CALCIUM  --    < > 8.0* 7.9* 8.0* 8.5* 8.5*  MG 1.6*  --  2.0 1.9 1.6* 2.1  --   PHOS 2.4*  --   --   --   --   --   --    < > = values in this interval not displayed.   Liver Function Tests: Recent Labs  Lab 12/03/20 1630 12/05/20 0457 12/06/20 0312 12/07/20 0525  AST 121* 59* 87* 92*  ALT 76* 53* 60* 74*  ALKPHOS 81 80 85 83  BILITOT 1.2  0.8 0.7 1.0  PROT 6.7 5.8* 6.0* 6.4*  ALBUMIN 3.3* 2.8* 2.8* 3.1*   Recent Labs  Lab 12/03/20 1630  LIPASE 25   No results for input(s): AMMONIA in the last 168 hours. CBC: Recent Labs  Lab 12/03/20 1630 12/04/20 0231 12/05/20 0457 12/06/20 0312 12/08/20 0537  WBC 5.3 5.7 4.8 4.4 5.1  NEUTROABS 3.4  --   --   --   --   HGB 14.0 13.9 12.5* 12.1* 12.9*  HCT 37.8* 38.0* 35.5* 34.9* 37.0*  MCV 87.7 89.8 91.5 94.1 93.9  PLT 78* 82* 96* 113* 186   Cardiac Enzymes: No results for input(s): CKTOTAL, CKMB, CKMBINDEX, TROPONINI in the last 168 hours. BNP: Invalid input(s): POCBNP CBG: Recent Labs  Lab 12/03/20 2035 12/07/20 0308  GLUCAP 93 136*   D-Dimer No results for input(s): DDIMER in the last 72 hours. Hgb A1c No results for input(s): HGBA1C in the last 72 hours. Lipid Profile No results for input(s): CHOL, HDL, LDLCALC, TRIG, CHOLHDL, LDLDIRECT in the last 72 hours. Thyroid function studies No results for input(s): TSH, T4TOTAL, T3FREE, THYROIDAB in the last 72 hours.  Invalid input(s): FREET3 Anemia work up No results for input(s): VITAMINB12, FOLATE, FERRITIN, TIBC, IRON, RETICCTPCT in the last 72 hours. Urinalysis    Component Value Date/Time   COLORURINE YELLOW 12/03/2020 1715   APPEARANCEUR CLEAR 12/03/2020 1715   APPEARANCEUR Clear 01/10/2012 1248   LABSPEC <1.005 (L) 12/03/2020 1715   LABSPEC 1.008 01/10/2012 1248   PHURINE 6.0 12/03/2020 1715   GLUCOSEU NEGATIVE 12/03/2020 1715   GLUCOSEU Negative 01/10/2012 1248   HGBUR NEGATIVE 12/03/2020 1715   BILIRUBINUR NEGATIVE 12/03/2020 1715    BILIRUBINUR Negative 01/10/2012 1248   KETONESUR 40 (A) 12/03/2020 1715   PROTEINUR NEGATIVE 12/03/2020 1715   NITRITE NEGATIVE 12/03/2020 1715   LEUKOCYTESUR NEGATIVE 12/03/2020 1715   LEUKOCYTESUR Negative 01/10/2012 1248   Sepsis Labs Invalid input(s): PROCALCITONIN,  WBC,  LACTICIDVEN Microbiology Recent Results (from the past 240 hour(s))  Resp Panel by RT-PCR (Flu A&B, Covid) Nasopharyngeal Swab     Status: None   Collection Time: 12/03/20  6:06 PM   Specimen: Nasopharyngeal Swab; Nasopharyngeal(NP) swabs in vial transport medium  Result Value Ref Range Status   SARS Coronavirus 2 by RT PCR NEGATIVE NEGATIVE Final    Comment: (NOTE) SARS-CoV-2 target nucleic acids are NOT DETECTED.  The SARS-CoV-2 RNA is generally detectable in upper respiratory specimens during the acute phase of infection. The lowest concentration of SARS-CoV-2 viral copies this assay can detect is 138 copies/mL. A negative result does not preclude SARS-Cov-2 infection and should not be used as the sole basis for treatment or other patient management decisions. A negative result may occur with  improper specimen collection/handling, submission of specimen other than nasopharyngeal swab, presence of viral mutation(s) within the areas targeted by this assay, and inadequate number of viral copies(<138 copies/mL). A negative result must be combined with clinical observations, patient history, and epidemiological information. The expected result is Negative.  Fact Sheet for Patients:  BloggerCourse.com  Fact Sheet for Healthcare Providers:  SeriousBroker.it  This test is no t yet approved or cleared by the [place] FDA and  has been authorized for detection and/or diagnosis of SARS-CoV-2 by FDA under an Emergency Use Authorization (EUA). This EUA will remain  in effect (meaning this test can be used) for the duration of the COVID-19 declaration under  Section 564(b)(1) of the Act, 21 U.S.C.section 360bbb-3(b)(1), unless the authorization is terminated  or revoked sooner.       Influenza A by PCR NEGATIVE NEGATIVE Final   Influenza B by PCR NEGATIVE NEGATIVE Final    Comment: (NOTE) The Xpert Xpress SARS-CoV-2/FLU/RSV plus assay is intended as an aid in the diagnosis of influenza from Nasopharyngeal swab specimens and should not be used as a sole basis for treatment. Nasal washings and aspirates are unacceptable for Xpert Xpress SARS-CoV-2/FLU/RSV testing.  Fact Sheet for Patients: BloggerCourse.com  Fact Sheet for Healthcare Providers: SeriousBroker.it  This test is not yet approved or cleared by the [place] FDA and has been authorized for detection and/or diagnosis of SARS-CoV-2 by FDA under an Emergency Use Authorization (EUA). This EUA will remain in effect (meaning this test can be used) for the duration of the COVID-19 declaration under Section 564(b)(1) of the Act, 21 U.S.C. section 360bbb-3(b)(1), unless the authorization is terminated or revoked.  Performed at [facility], [address], [place], [place] [address]   MRSA Next Gen by PCR, Nasal     Status: None   Collection Time: 12/03/20 11:09 PM   Specimen: Nasal Mucosa; Nasal Swab  Result Value Ref Range Status   MRSA by PCR Next Gen NOT DETECTED NOT DETECTED Final    Comment: (NOTE) The GeneXpert MRSA Assay (FDA approved for NASAL specimens only), is one component of a comprehensive MRSA colonization surveillance program. It is not intended to diagnose MRSA infection nor to guide or monitor treatment for MRSA infections. Test performance is not FDA approved in patients less than 76 years old. Performed at [facility], [address], [place], [place] [address]   Resp Panel by RT-PCR (Flu A&B, Covid) Nasopharyngeal Swab     Status: None   Collection Time: 12/08/20  1:24 PM   Specimen: Nasopharyngeal  Swab; Nasopharyngeal(NP) swabs in vial transport medium  Result Value Ref Range Status   SARS Coronavirus 2 by RT PCR NEGATIVE NEGATIVE Final    Comment: (NOTE) SARS-CoV-2 target nucleic acids are NOT DETECTED.  The SARS-CoV-2 RNA is generally detectable in upper respiratory specimens during the acute phase of infection. The lowest concentration of SARS-CoV-2 viral copies this assay can detect is 138 copies/mL. A negative result does not preclude SARS-Cov-2 infection and should not be used as the sole basis for treatment or other patient management decisions. A negative result may occur with  improper specimen collection/handling, submission of specimen other than nasopharyngeal swab, presence of viral mutation(s) within the areas targeted by this assay, and inadequate number of viral copies(<138 copies/mL). A negative result must be combined with clinical observations, patient history, and epidemiological information. The expected result is Negative.  Fact Sheet for Patients:  BloggerCourse.com  Fact Sheet for Healthcare Providers:  SeriousBroker.it  This test is no t yet approved or cleared by the [place] FDA and  has been authorized for detection and/or diagnosis of SARS-CoV-2 by FDA under an Emergency Use Authorization (EUA). This EUA will remain  in effect (meaning this test can be used) for the duration of the COVID-19 declaration under Section 564(b)(1) of the Act, 21 U.S.C.section 360bbb-3(b)(1), unless the authorization is terminated  or revoked sooner.       Influenza A by PCR NEGATIVE NEGATIVE Final   Influenza B by PCR NEGATIVE NEGATIVE Final    Comment: (NOTE) The Xpert Xpress SARS-CoV-2/FLU/RSV plus assay is intended as an aid in the diagnosis of influenza from Nasopharyngeal swab specimens and should not be used as a sole basis for treatment. Nasal washings and  aspirates are unacceptable for Xpert Xpress  SARS-CoV-2/FLU/RSV testing.  Fact Sheet for Patients: BloggerCourse.com  Fact Sheet for Healthcare Providers: SeriousBroker.it  This test is not yet approved or cleared by the [place] FDA and has been authorized for detection and/or diagnosis of SARS-CoV-2 by FDA under an Emergency Use Authorization (EUA). This EUA will remain in effect (meaning this test can be used) for the duration of the COVID-19 declaration under Section 564(b)(1) of the Act, 21 U.S.C. section 360bbb-3(b)(1), unless the authorization is terminated or revoked.  Performed at [facility], [address], [place], [place] [address]     Time coordinating discharge: 38 mins   SIGNED:  Standley Dakins, MD  [facility] 12/10/2020, 12:29 PM How to contact the [facility] Attending or Consulting provider 7A - 7P or covering provider during after hours 7P -7A, for this patient?  Check the care team in [facility] and look for a) attending/consulting [facility] provider listed and b) the [facility] team listed Log into [email or web address] and use 's universal password to access. If you do not have the password, please contact the hospital operator. Locate the [facility] provider you are looking for under [facility] and page to a number that you can be directly reached. If you still have difficulty reaching the provider, please page the [facility] (Director on Call) for the Hospitalists listed on amion for assistance.

## 2020-12-10 NOTE — TOC Progression Note (Addendum)
Transition of Care [facility]) - Progression Note    Patient Details  Name: Keldon Lassen MRN: [id number] Date of Birth: 12-16-1955  [facility] CM/SW Contact  Annice Needy, LCSW Phone Number: 12/10/2020, 1:40 PM  Clinical Narrative:    [employer] Medicare denied SNF. Advised that a peer to peer was needed. Peer to peer information (TODAY by 5p.m. [phone], Intake ID [id number]) provided to attending. Attending advised that he would place [facility] orders as peer to peer was unsuccessful. Patient states that he is moving from his current address. He does not know where he will go. His sister is picking him up from the hospital. Patient declined a homeless shelter listing advising that he does not want to go to a shelter. States that he does not know if his sister will allow him to stay with him. HH services were also declined as he states he does not know where he will be residing.  [facility] and they are his primary care providers.   Expected Discharge Plan: Home/Self Care Barriers to Discharge: Continued Medical Work up, English as a second language teacher  Expected Discharge Plan and Services Expected Discharge Plan: Home/Self Care       Living arrangements for the past 2 months: Single Family Home Expected Discharge Date: 12/10/20                                     Social Determinants of Health (SDOH) Interventions    Readmission Risk Interventions No flowsheet data found.

## 2020-12-10 NOTE — Plan of Care (Signed)
  Problem: Education: Goal: Knowledge of General Education information will improve Description: Including pain rating scale, medication(s)/side effects and non-pharmacologic comfort measures Outcome: Progressing   Problem: Clinical Measurements: Goal: Diagnostic test results will improve Outcome: Progressing   

## 2020-12-10 NOTE — Care Management Important Message (Signed)
Important Message  Patient Details  Name: Barry Fowler MRN: [id number] Date of Birth: 17-Nov-1955   Medicare Important Message Given:  Yes     Corey Harold 12/10/2020, 2:04 PM

## 2020-12-10 NOTE — TOC Progression Note (Signed)
Transition of Care [facility]) - Progression Note    Patient Details  Name: Kitt Ledet MRN: [id number] Date of Birth: 1955-11-25  Transition of Care [facility]) CM/SW Contact  Annice Needy, LCSW Phone Number: 12/10/2020, 4:41 PM  Clinical Narrative:    Spoke with patient's sister, Synetta Fail. Discussed that patient had been denied for SNF and had been provided homeless shelter listing. Synetta Fail stated that she and the siblings did not desire for patient to live with them because of his drinking. She stated that patient wanted to go to their mother's house and stay but their mother has dementia and their sister cares for her. Synetta Fail indicated that her sister probably did not want to deal with patient and their mother. She discussed that patient gets $1500/month income. It was discussed that patient mismanages his funds as to the reason he is unable to secure housing.  Synetta Fail stated that she knew that it was not the hospital's responsibility and that either her or her sister would pick patient up and they would figure out a living arrangement for patient (she mentioned hotels, boarding houses).     Expected Discharge Plan: Home/Self Care Barriers to Discharge: Continued Medical Work up, English as a second language teacher  Expected Discharge Plan and Services Expected Discharge Plan: Home/Self Care       Living arrangements for the past 2 months: Single Family Home Expected Discharge Date: 12/10/20                                     Social Determinants of Health (SDOH) Interventions    Readmission Risk Interventions No flowsheet data found.

## 2021-11-02 ENCOUNTER — Ambulatory Visit: Payer: Medicare Other | Admitting: Podiatry

## 2021-11-05 ENCOUNTER — Ambulatory Visit (INDEPENDENT_AMBULATORY_CARE_PROVIDER_SITE_OTHER): Payer: Medicare Other

## 2021-11-05 ENCOUNTER — Ambulatory Visit (INDEPENDENT_AMBULATORY_CARE_PROVIDER_SITE_OTHER): Payer: Medicare Other | Admitting: Podiatry

## 2021-11-05 DIAGNOSIS — S92412A Displaced fracture of proximal phalanx of left great toe, initial encounter for closed fracture: Secondary | ICD-10-CM | POA: Diagnosis not present

## 2021-11-05 NOTE — Progress Notes (Signed)
  Subjective:  Patient ID: Barry Fowler, male    DOB: 25-Jun-1955,  MRN: [id number]  No chief complaint on file.   66 y.o. male presents with the above complaint.  Patient presents with complaint left hallux pain.  Patient states pain for touch is progressive gotten worse.  He had an injury 3 weeks ago where he stubbed his foot specifically the big toe.  He has been having pain ever since has been ambulating with regular shoes he denies any other acute complaints he has not seen anyone else prior to seeing me for this.  The date of injury was October 22, 2021   Review of Systems: Negative except as noted in the HPI. Denies N/V/F/Ch.  Past Medical History:  Diagnosis Date   Hypertension     Current Outpatient Medications:    folic acid (FOLVITE) 1 MG tablet, Take 1 tablet (1 mg total) by mouth daily., Disp: , Rfl:    Multiple Vitamin (MULTIVITAMIN WITH MINERALS) TABS tablet, Take 1 tablet by mouth daily., Disp: 30 tablet, Rfl: 0   pantoprazole (PROTONIX) 40 MG tablet, Take 1 tablet (40 mg total) by mouth 2 (two) times daily., Disp: 90 tablet, Rfl: 0   polyethylene glycol (MIRALAX / GLYCOLAX) 17 g packet, Take 17 g by mouth daily as needed for mild constipation., Disp: 14 each, Rfl: 0   pravastatin (PRAVACHOL) 20 MG tablet, Take 20 mg by mouth daily., Disp: , Rfl:    thiamine 100 MG tablet, Take 1 tablet (100 mg total) by mouth daily., Disp: 30 tablet, Rfl: 0  Social History   Tobacco Use  Smoking Status Every Day   Packs/day: 1.00   Years: 42.00   Total pack years: 42.00   Types: Cigarettes  Smokeless Tobacco Never    No Known Allergies Objective:  There were no vitals filed for this visit. There is no height or weight on file to calculate BMI. Constitutional Well developed. Well nourished.  Vascular Dorsalis pedis pulses palpable bilaterally. Posterior tibial pulses palpable bilaterally. Capillary refill normal to all digits.  No cyanosis or clubbing noted. Pedal hair growth  normal.  Neurologic Normal speech. Oriented to person, place, and time. Epicritic sensation to light touch grossly present bilaterally.  Dermatologic Nails well groomed and normal in appearance. No open wounds. No skin lesions.  Orthopedic: Pain on palpation to the left great toe.  Pain with range of motion of the MPJ mildly.  No pain with range of motion of the IPJ.  Mild extensor hallucis longus tendinitis noted.  Also pain at the sesamoidal complex   Radiographs: 3 views of skeletally mature adult left foot: Proximal phalanx fracture noted at the base intra-articular.  Nondisplaced nonangulated. Assessment:   1. Closed displaced fracture of proximal phalanx of left great toe, initial encounter    Plan:  Patient was evaluated and treated and all questions answered.  Left hallux proximal phalanx fracture nondisplaced -All questions and concerns were discussed with the patient in extensive detail -Given the amount of pain that he is having he will benefit from surgical shoe immobilization patient agrees with plan and would like to proceed with surgical shoe immobilization -Surgical shoe was dispensed  No follow-ups on file.

## 2022-10-29 IMAGING — CT CT HEAD W/O CM
3 series · 15 of 47 positions shown, 18 images · non-contrast
Comparison: 01/11/2020

CLINICAL DATA: Fall, hit head

EXAM:
CT HEAD WITHOUT CONTRAST
TECHNIQUE: Contiguous axial images were obtained from the base of the skull
through the vertex without intravenous contrast.

[Series 3: head w o · axial · 0.42mm/px · z∈[+1517,+1647]mm · 9 of 32 slices shown, 12 images]
[im 3/32  brain]
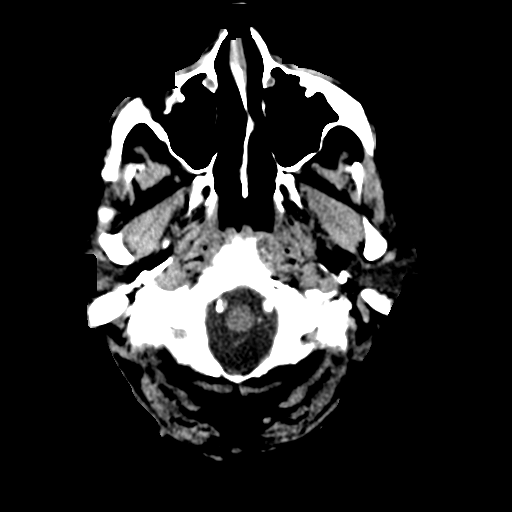
[im 3/32  bone]
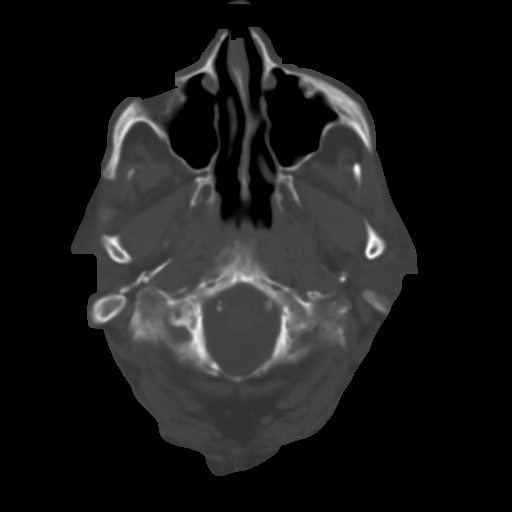
[im 6/32  brain]
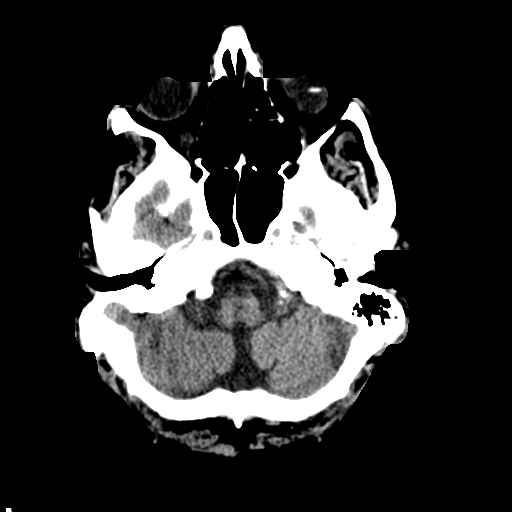
[im 9/32  brain]
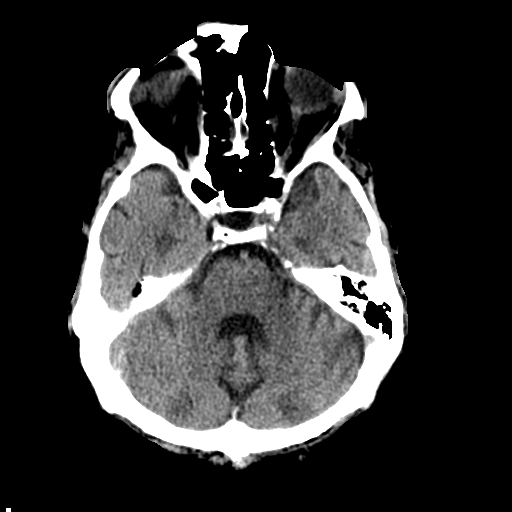
[im 12/32  brain]
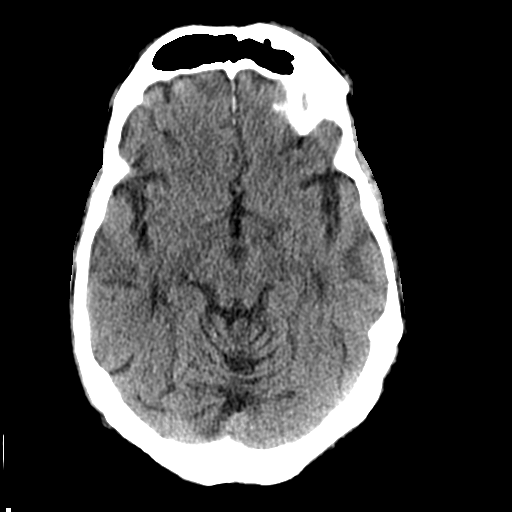
[im 17/32  brain]
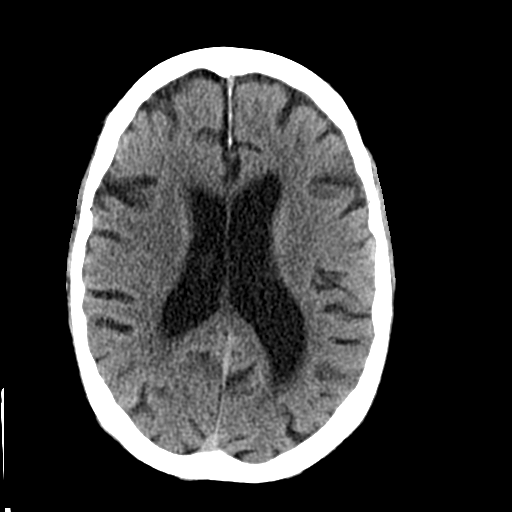
[im 17/32  bone]
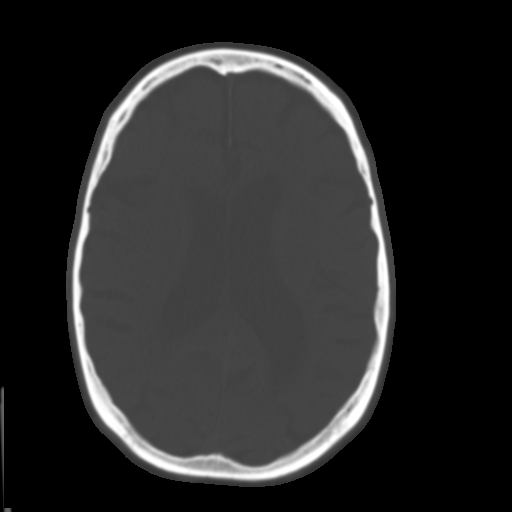
[im 20/32  brain]
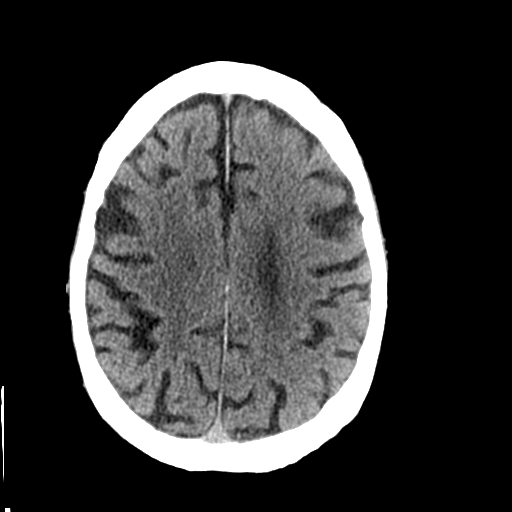
[im 23/32  brain]
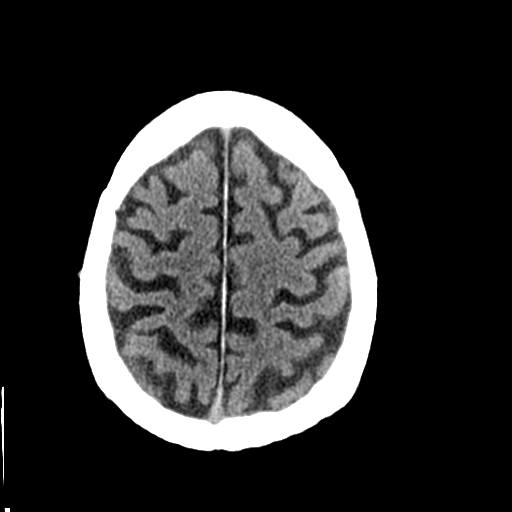
[im 26/32  brain]
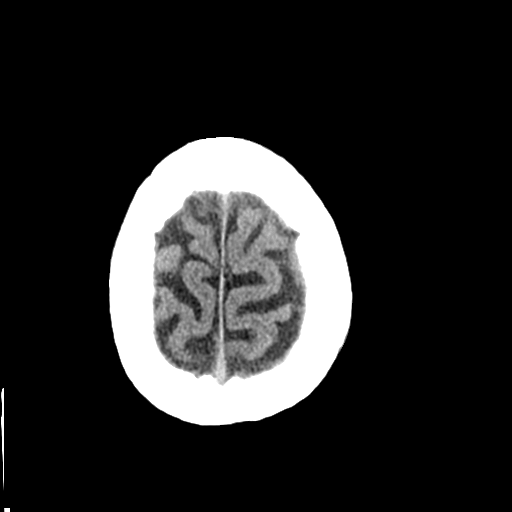
[im 29/32  brain]
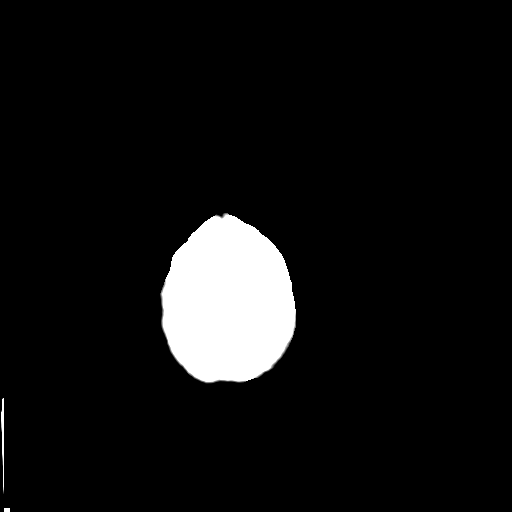
[im 29/32  bone]
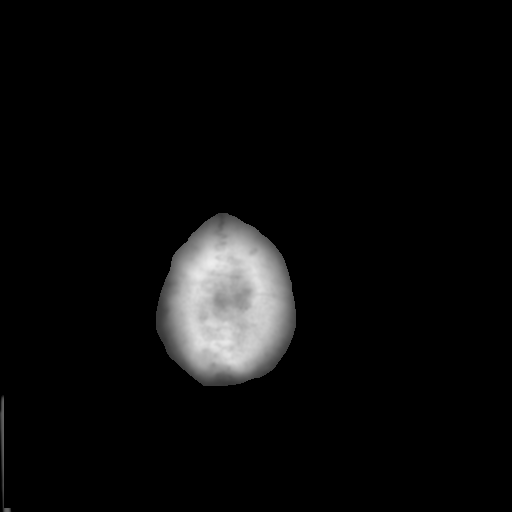

[Series 5: coronal soft · coronal · 0.29mm/px · 3 of 69 slices shown]
[im 23/69  brain]
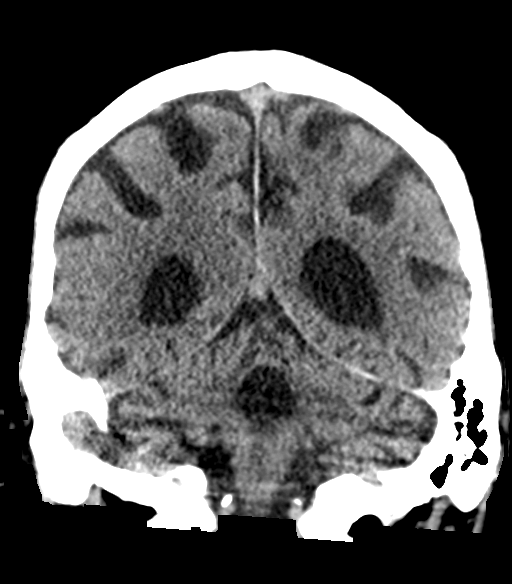
[im 31/69  brain]
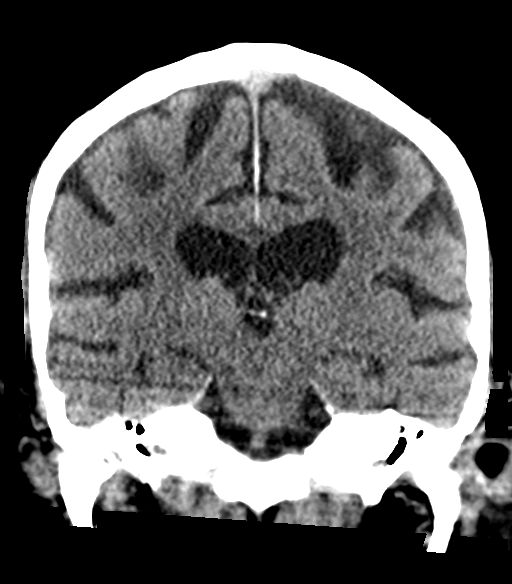
[im 38/69  brain]
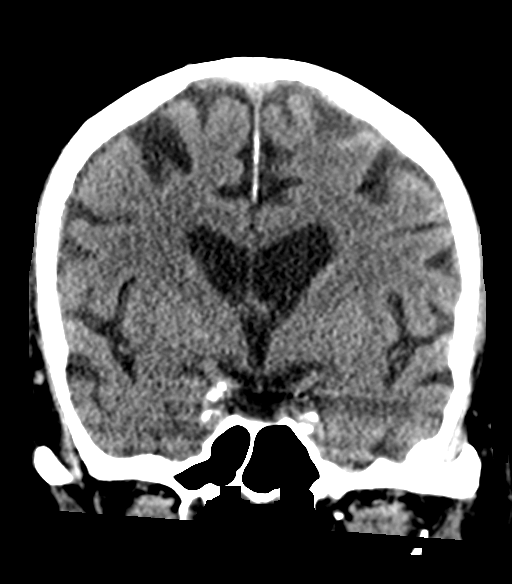

[Series 6: sagittal soft · sagittal · 0.32mm/px · 3 of 53 slices shown]
[im 18/53  brain]
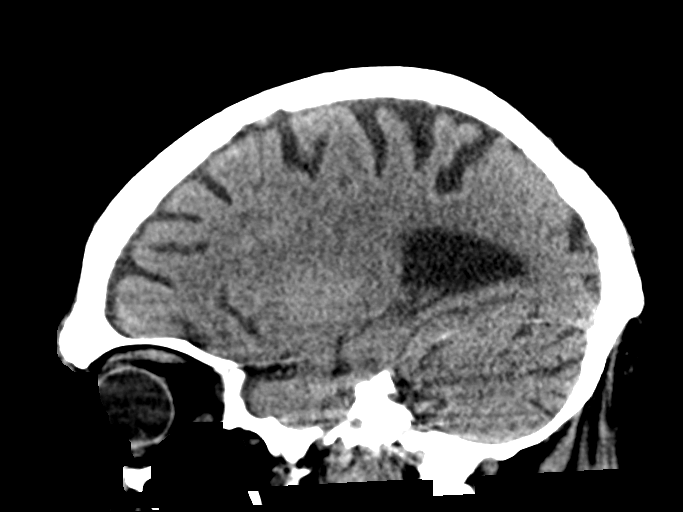
[im 27/53  brain]
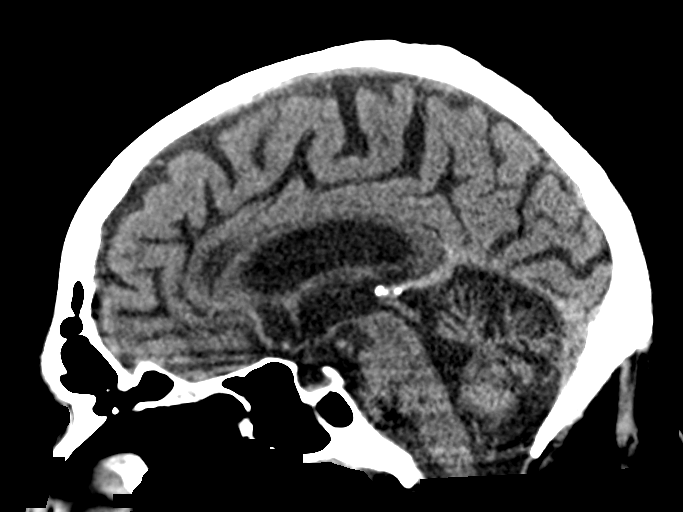
[im 35/53  brain]
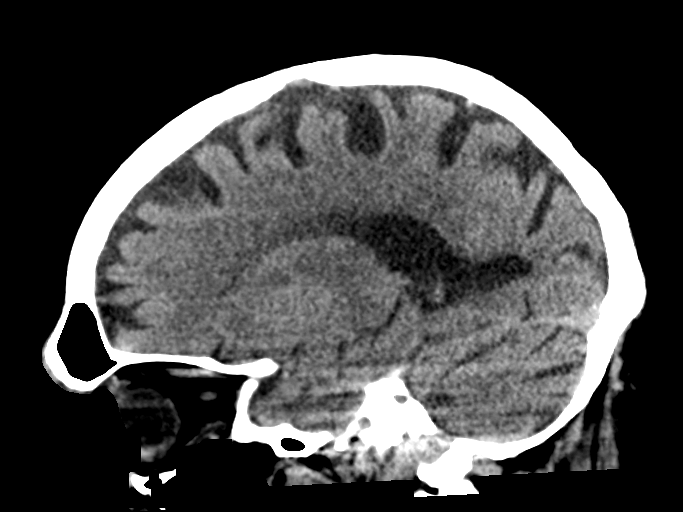

[15 of 47 positions shown; findings below may reference images not displayed]

FINDINGS: Brain: There is atrophy and chronic small vessel disease changes. No
acute intracranial abnormality. Specifically, no hemorrhage,
hydrocephalus, mass lesion, acute infarction, or significant
intracranial injury.

Vascular: No hyperdense vessel or unexpected calcification.

Skull: No acute calvarial abnormality.

Sinuses/Orbits: Visualized paranasal sinuses and mastoids clear.
Orbital soft tissues unremarkable.

Other: 90
IMPRESSION: Atrophy, chronic microvascular disease.

No acute intracranial abnormality.

## 2023-06-06 DIAGNOSIS — F17200 Nicotine dependence, unspecified, uncomplicated: Secondary | ICD-10-CM | POA: Diagnosis not present

## 2023-06-06 DIAGNOSIS — E785 Hyperlipidemia, unspecified: Secondary | ICD-10-CM | POA: Diagnosis not present

## 2023-06-06 DIAGNOSIS — Z0000 Encounter for general adult medical examination without abnormal findings: Secondary | ICD-10-CM | POA: Diagnosis not present

## 2023-06-06 DIAGNOSIS — I10 Essential (primary) hypertension: Secondary | ICD-10-CM | POA: Diagnosis not present

## 2023-06-06 DIAGNOSIS — Z1389 Encounter for screening for other disorder: Secondary | ICD-10-CM | POA: Diagnosis not present

## 2023-06-06 DIAGNOSIS — Z13220 Encounter for screening for lipoid disorders: Secondary | ICD-10-CM | POA: Diagnosis not present

## 2023-06-06 DIAGNOSIS — Z0131 Encounter for examination of blood pressure with abnormal findings: Secondary | ICD-10-CM | POA: Diagnosis not present

## 2023-06-06 DIAGNOSIS — Z23 Encounter for immunization: Secondary | ICD-10-CM | POA: Diagnosis not present

## 2023-06-06 DIAGNOSIS — Z131 Encounter for screening for diabetes mellitus: Secondary | ICD-10-CM | POA: Diagnosis not present

## 2023-08-28 DIAGNOSIS — I3139 Other pericardial effusion (noninflammatory): Secondary | ICD-10-CM

## 2023-08-28 HISTORY — DX: Other pericardial effusion (noninflammatory): I31.39

## 2023-09-05 ENCOUNTER — Emergency Department

## 2023-09-05 ENCOUNTER — Inpatient Hospital Stay
Admission: EM | Admit: 2023-09-05 | Discharge: 2023-09-13 | DRG: 640 | Disposition: A | Discharge status: Skilled Nursing Facility | Attending: Internal Medicine | Admitting: Internal Medicine

## 2023-09-05 DIAGNOSIS — Z741 Need for assistance with personal care: Secondary | ICD-10-CM | POA: Diagnosis not present

## 2023-09-05 DIAGNOSIS — E440 Moderate protein-calorie malnutrition: Secondary | ICD-10-CM | POA: Diagnosis present

## 2023-09-05 DIAGNOSIS — W19XXXA Unspecified fall, initial encounter: Secondary | ICD-10-CM | POA: Diagnosis present

## 2023-09-05 DIAGNOSIS — R0989 Other specified symptoms and signs involving the circulatory and respiratory systems: Secondary | ICD-10-CM | POA: Diagnosis not present

## 2023-09-05 DIAGNOSIS — F10239 Alcohol dependence with withdrawal, unspecified: Secondary | ICD-10-CM | POA: Diagnosis not present

## 2023-09-05 DIAGNOSIS — L8942 Pressure ulcer of contiguous site of back, buttock and hip, stage 2: Secondary | ICD-10-CM | POA: Clinically undetermined

## 2023-09-05 DIAGNOSIS — Z043 Encounter for examination and observation following other accident: Secondary | ICD-10-CM | POA: Diagnosis not present

## 2023-09-05 DIAGNOSIS — E861 Hypovolemia: Secondary | ICD-10-CM | POA: Diagnosis not present

## 2023-09-05 DIAGNOSIS — L8990 Pressure ulcer of unspecified site, unspecified stage: Secondary | ICD-10-CM | POA: Insufficient documentation

## 2023-09-05 DIAGNOSIS — F1090 Alcohol use, unspecified, uncomplicated: Secondary | ICD-10-CM | POA: Diagnosis not present

## 2023-09-05 DIAGNOSIS — I4892 Unspecified atrial flutter: Secondary | ICD-10-CM | POA: Diagnosis present

## 2023-09-05 DIAGNOSIS — Z8249 Family history of ischemic heart disease and other diseases of the circulatory system: Secondary | ICD-10-CM

## 2023-09-05 DIAGNOSIS — I480 Paroxysmal atrial fibrillation: Secondary | ICD-10-CM | POA: Diagnosis not present

## 2023-09-05 DIAGNOSIS — Z7401 Bed confinement status: Secondary | ICD-10-CM | POA: Diagnosis not present

## 2023-09-05 DIAGNOSIS — D72829 Elevated white blood cell count, unspecified: Secondary | ICD-10-CM | POA: Diagnosis present

## 2023-09-05 DIAGNOSIS — F17210 Nicotine dependence, cigarettes, uncomplicated: Secondary | ICD-10-CM | POA: Diagnosis present

## 2023-09-05 DIAGNOSIS — R410 Disorientation, unspecified: Secondary | ICD-10-CM | POA: Diagnosis not present

## 2023-09-05 DIAGNOSIS — R296 Repeated falls: Secondary | ICD-10-CM | POA: Diagnosis not present

## 2023-09-05 DIAGNOSIS — I700 Atherosclerosis of aorta: Secondary | ICD-10-CM | POA: Diagnosis not present

## 2023-09-05 DIAGNOSIS — R54 Age-related physical debility: Secondary | ICD-10-CM | POA: Diagnosis present

## 2023-09-05 DIAGNOSIS — I3139 Other pericardial effusion (noninflammatory): Secondary | ICD-10-CM | POA: Diagnosis present

## 2023-09-05 DIAGNOSIS — R918 Other nonspecific abnormal finding of lung field: Secondary | ICD-10-CM | POA: Diagnosis not present

## 2023-09-05 DIAGNOSIS — T796XXA Traumatic ischemia of muscle, initial encounter: Secondary | ICD-10-CM | POA: Diagnosis not present

## 2023-09-05 DIAGNOSIS — K7010 Alcoholic hepatitis without ascites: Secondary | ICD-10-CM | POA: Diagnosis present

## 2023-09-05 DIAGNOSIS — R404 Transient alteration of awareness: Secondary | ICD-10-CM | POA: Diagnosis not present

## 2023-09-05 DIAGNOSIS — E876 Hypokalemia: Secondary | ICD-10-CM | POA: Diagnosis not present

## 2023-09-05 DIAGNOSIS — Z79899 Other long term (current) drug therapy: Secondary | ICD-10-CM

## 2023-09-05 DIAGNOSIS — J189 Pneumonia, unspecified organism: Secondary | ICD-10-CM | POA: Diagnosis not present

## 2023-09-05 DIAGNOSIS — I4891 Unspecified atrial fibrillation: Secondary | ICD-10-CM | POA: Diagnosis not present

## 2023-09-05 DIAGNOSIS — S199XXA Unspecified injury of neck, initial encounter: Secondary | ICD-10-CM | POA: Diagnosis not present

## 2023-09-05 DIAGNOSIS — I272 Pulmonary hypertension, unspecified: Secondary | ICD-10-CM | POA: Diagnosis not present

## 2023-09-05 DIAGNOSIS — E871 Hypo-osmolality and hyponatremia: Secondary | ICD-10-CM | POA: Diagnosis not present

## 2023-09-05 DIAGNOSIS — S0511XA Contusion of eyeball and orbital tissues, right eye, initial encounter: Secondary | ICD-10-CM | POA: Diagnosis present

## 2023-09-05 DIAGNOSIS — D649 Anemia, unspecified: Secondary | ICD-10-CM | POA: Diagnosis not present

## 2023-09-05 DIAGNOSIS — E46 Unspecified protein-calorie malnutrition: Secondary | ICD-10-CM | POA: Diagnosis not present

## 2023-09-05 DIAGNOSIS — E569 Vitamin deficiency, unspecified: Secondary | ICD-10-CM | POA: Diagnosis not present

## 2023-09-05 DIAGNOSIS — Z6825 Body mass index (BMI) 25.0-25.9, adult: Secondary | ICD-10-CM

## 2023-09-05 DIAGNOSIS — G9341 Metabolic encephalopathy: Secondary | ICD-10-CM | POA: Diagnosis not present

## 2023-09-05 DIAGNOSIS — K219 Gastro-esophageal reflux disease without esophagitis: Secondary | ICD-10-CM | POA: Diagnosis not present

## 2023-09-05 DIAGNOSIS — R0689 Other abnormalities of breathing: Secondary | ICD-10-CM | POA: Diagnosis not present

## 2023-09-05 DIAGNOSIS — R9082 White matter disease, unspecified: Secondary | ICD-10-CM | POA: Diagnosis not present

## 2023-09-05 DIAGNOSIS — M25521 Pain in right elbow: Secondary | ICD-10-CM | POA: Diagnosis not present

## 2023-09-05 DIAGNOSIS — I959 Hypotension, unspecified: Secondary | ICD-10-CM | POA: Diagnosis not present

## 2023-09-05 DIAGNOSIS — Z743 Need for continuous supervision: Secondary | ICD-10-CM | POA: Diagnosis not present

## 2023-09-05 DIAGNOSIS — R059 Cough, unspecified: Secondary | ICD-10-CM | POA: Diagnosis not present

## 2023-09-05 DIAGNOSIS — E872 Acidosis, unspecified: Secondary | ICD-10-CM | POA: Diagnosis present

## 2023-09-05 DIAGNOSIS — R2681 Unsteadiness on feet: Secondary | ICD-10-CM | POA: Diagnosis not present

## 2023-09-05 DIAGNOSIS — J90 Pleural effusion, not elsewhere classified: Secondary | ICD-10-CM | POA: Diagnosis not present

## 2023-09-05 DIAGNOSIS — I10 Essential (primary) hypertension: Secondary | ICD-10-CM | POA: Diagnosis present

## 2023-09-05 DIAGNOSIS — E785 Hyperlipidemia, unspecified: Secondary | ICD-10-CM | POA: Diagnosis present

## 2023-09-05 DIAGNOSIS — R0902 Hypoxemia: Secondary | ICD-10-CM | POA: Diagnosis not present

## 2023-09-05 DIAGNOSIS — M6259 Muscle wasting and atrophy, not elsewhere classified, multiple sites: Secondary | ICD-10-CM | POA: Diagnosis not present

## 2023-09-05 DIAGNOSIS — S7011XA Contusion of right thigh, initial encounter: Secondary | ICD-10-CM | POA: Diagnosis present

## 2023-09-05 LAB — COMPREHENSIVE METABOLIC PANEL WITH GFR
ALT: 42 U/L (ref 0–44)
AST: 123 U/L — ABNORMAL HIGH (ref 15–41)
Albumin: 2.8 g/dL — ABNORMAL LOW (ref 3.5–5.0)
Alkaline Phosphatase: 48 U/L (ref 38–126)
Anion gap: 17 — ABNORMAL HIGH (ref 5–15)
BUN: 9 mg/dL (ref 8–23)
CO2: 17 mmol/L — ABNORMAL LOW (ref 22–32)
Calcium: 7.5 mg/dL — ABNORMAL LOW (ref 8.9–10.3)
Chloride: 70 mmol/L — ABNORMAL LOW (ref 98–111)
Creatinine, Ser: 0.51 mg/dL — ABNORMAL LOW (ref 0.61–1.24)
GFR, Estimated: >60 mL/min (ref >60)
Glucose, Bld: 78 mg/dL (ref 70–99)
Potassium: 3.7 mmol/L (ref 3.5–5.1)
Sodium: 104 mmol/L — CL (ref 135–145)
Total Bilirubin: 1.9 mg/dL — ABNORMAL HIGH (ref 0.0–1.2)
Total Protein: 6 g/dL — ABNORMAL LOW (ref 6.5–8.1)

## 2023-09-05 LAB — URINALYSIS, ROUTINE W REFLEX MICROSCOPIC
Bacteria, UA: NONE SEEN
Bilirubin Urine: NEGATIVE
Glucose, UA: 150 mg/dL — AB
Ketones, ur: 80 mg/dL — AB
Leukocytes,Ua: NEGATIVE
Nitrite: NEGATIVE
Protein, ur: 30 mg/dL — AB
Specific Gravity, Urine: 1.02 (ref 1.005–1.030)
Squamous Epithelial / HPF: 0 /HPF (ref 0–5)
pH: 6 (ref 5.0–8.0)

## 2023-09-05 LAB — URINE DRUG SCREEN, QUALITATIVE (ARMC ONLY)
Amphetamines, Ur Screen: NOT DETECTED
Barbiturates, Ur Screen: NOT DETECTED
Benzodiazepine, Ur Scrn: NOT DETECTED
Cannabinoid 50 Ng, Ur ~~LOC~~: NOT DETECTED
Cocaine Metabolite,Ur ~~LOC~~: NOT DETECTED
MDMA (Ecstasy)Ur Screen: NOT DETECTED
Methadone Scn, Ur: NOT DETECTED
Opiate, Ur Screen: NOT DETECTED
Phencyclidine (PCP) Ur S: NOT DETECTED
Tricyclic, Ur Screen: NOT DETECTED

## 2023-09-05 LAB — CBC WITH DIFFERENTIAL/PLATELET
Abs Immature Granulocytes: 0.13 10*3/uL — ABNORMAL HIGH (ref 0.00–0.07)
Basophils Absolute: 0 10*3/uL (ref 0.0–0.1)
Basophils Relative: 0 %
Eosinophils Absolute: 0 10*3/uL (ref 0.0–0.5)
Eosinophils Relative: 0 %
Hemoglobin: 12.1 g/dL — ABNORMAL LOW (ref 13.0–17.0)
Immature Granulocytes: 1 %
Lymphocytes Relative: 13 %
Lymphs Abs: 1.8 10*3/uL (ref 0.7–4.0)
Monocytes Absolute: 1.5 10*3/uL — ABNORMAL HIGH (ref 0.1–1.0)
Monocytes Relative: 11 %
Neutro Abs: 10.5 10*3/uL — ABNORMAL HIGH (ref 1.7–7.7)
Neutrophils Relative %: 75 %
Platelets: 290 10*3/uL (ref 150–400)
Smear Review: NORMAL
WBC: 13.9 10*3/uL — ABNORMAL HIGH (ref 4.0–10.5)

## 2023-09-05 LAB — SODIUM: Sodium: 107 mmol/L — CL (ref 135–145)

## 2023-09-05 LAB — CK: Total CK: 3815 U/L — ABNORMAL HIGH (ref 49–397)

## 2023-09-05 LAB — MAGNESIUM
Magnesium: 1.8 mg/dL (ref 1.7–2.4)
Magnesium: 1.8 mg/dL (ref 1.7–2.4)

## 2023-09-05 LAB — SODIUM, URINE, RANDOM: Sodium, Ur: 11 mmol/L

## 2023-09-05 LAB — OSMOLALITY, URINE: Osmolality, Ur: 635 mosm/kg (ref 300–900)

## 2023-09-05 LAB — OSMOLALITY: Osmolality: 221 mosm/kg — CL (ref 275–295)

## 2023-09-05 LAB — ETHANOL: Alcohol, Ethyl (B): <15 mg/dL (ref <15)

## 2023-09-05 LAB — MRSA NEXT GEN BY PCR, NASAL: MRSA by PCR Next Gen: NOT DETECTED

## 2023-09-05 MED ORDER — THIAMINE MONONITRATE 100 MG PO TABS
100.0000 mg | ORAL_TABLET | Freq: Every day | ORAL | Status: DC
  Administered 2023-09-05: 100 mg via ORAL
  Filled 2023-09-05: qty 1

## 2023-09-05 MED ORDER — LACTATED RINGERS IV BOLUS
1000.0000 mL | Freq: Once | INTRAVENOUS | Status: AC
  Administered 2023-09-05: 1000 mL via INTRAVENOUS

## 2023-09-05 MED ORDER — ADULT MULTIVITAMIN W/MINERALS CH
1.0000 | ORAL_TABLET | Freq: Every day | ORAL | Status: DC
  Administered 2023-09-05 – 2023-09-13 (×6): 1 via ORAL
  Filled 2023-09-05 (×6): qty 1

## 2023-09-05 MED ORDER — ONDANSETRON HCL 4 MG/2ML IJ SOLN
4.0000 mg | Freq: Once | INTRAMUSCULAR | Status: AC
  Administered 2023-09-05: 4 mg via INTRAVENOUS
  Filled 2023-09-05: qty 2

## 2023-09-05 MED ORDER — POLYETHYLENE GLYCOL 3350 17 G PO PACK: 17.0000 g | PACK | Freq: Every day | ORAL | Status: DC | PRN

## 2023-09-05 MED ORDER — DOCUSATE SODIUM 100 MG PO CAPS: 100.0000 mg | ORAL_CAPSULE | Freq: Two times a day (BID) | ORAL | Status: DC | PRN

## 2023-09-05 MED ORDER — THIAMINE HCL 100 MG/ML IJ SOLN
200.0000 mg | Freq: Every day | INTRAVENOUS | Status: DC
  Administered 2023-09-06: 200 mg via INTRAVENOUS
  Filled 2023-09-05 (×2): qty 2

## 2023-09-05 MED ORDER — FOLIC ACID 1 MG PO TABS
1.0000 mg | ORAL_TABLET | Freq: Every day | ORAL | Status: DC
  Administered 2023-09-05 – 2023-09-13 (×6): 1 mg via ORAL
  Filled 2023-09-05 (×7): qty 1

## 2023-09-05 MED ORDER — LORAZEPAM 2 MG/ML IJ SOLN: 1.0000 mg | INTRAMUSCULAR | Status: DC | PRN

## 2023-09-05 MED ORDER — SODIUM CHLORIDE 0.9 % IV SOLN
3.0000 g | Freq: Once | INTRAVENOUS | Status: AC
  Administered 2023-09-05: 3 g via INTRAVENOUS
  Filled 2023-09-05: qty 8

## 2023-09-05 MED ORDER — SODIUM CHLORIDE 3 % IV SOLN: 600.0000 mL/h | Freq: Once | INTRAVENOUS | Status: DC

## 2023-09-05 MED ORDER — SODIUM CHLORIDE 0.9 % IV SOLN: INTRAVENOUS | Status: DC

## 2023-09-05 MED ORDER — SODIUM CHLORIDE 3 % IV BOLUS
100.0000 mL | Freq: Once | INTRAVENOUS | Status: AC
  Administered 2023-09-05: 100 mL via INTRAVENOUS
  Filled 2023-09-05: qty 500

## 2023-09-05 NOTE — ED Notes (Signed)
 Called [facility] for central monitoring

## 2023-09-05 NOTE — ED Notes (Addendum)
 Bed alarm on. Fall risk bracelet on. Pt wearing shoes.

## 2023-09-05 NOTE — ED Notes (Signed)
 EKG completed and given to provider.

## 2023-09-05 NOTE — ED Triage Notes (Signed)
 Pt arrived to ED via [facility] from home. Sister found patient on the floor and is unsure how long he was down for. Pt fell on Thursday and has bruises from that fall. Pt does have a large older hematoma on right eye. Pt is intoxicated. EMS first BP 80/40. 500mL NaCl given pta. Pt's BP at time of triage 128/78

## 2023-09-05 NOTE — H&P (Signed)
 NAME:  Barry Fowler, MRN:  [id number], DOB:  [birth year], LOS: 1 ADMISSION DATE:  09/05/2023, CONSULTATION DATE: 09/05/2023 REFERRING MD: Arline Bennett, CHIEF COMPLAINT: Fall  HPI  68 y.o male with significant PMH of EtOH abuse, tobacco abuse, hypertension, chronic hyponatremia and recurrent falls who presented to the ED with chief complaints of fall.  Per ED reports, patient was found down by sister who called EMS.  It is unclear how long he has been down but reported he fell on Thursday and sustained multiple bruises.  Patient sister reported he has been binge drinking and she normally checks on him a few times per week.   ED Course: Initial vital signs showed HR of 104 beats/minute, BP 128/78 mm Hg, the RR 18 breaths/minute, and the oxygen saturation 96% on RA and a temperature of 98.49F (36.7C).  Pertinent Labs/Diagnostics Findings: Na+/ K+: 104/3.7.  CO2 17.  Anion gap 17 glucose: 78 Calcium: 7.5 AST/ALT: 123/42 Serum osmolality 221 CK 3850 WBC: 13.9 CXR> CTH> CT Maxillary>CT cervical> negative Medication administered in the ED: 3% hypertonic solution 100 mL, 1L LR bolus and Unasyn Disposition:ICU  Past Medical History  EtOH abuse, tobacco abuse, hypertension, chronic hyponatremia and recurrent falls   Significant Hospital Events   6/9: Admitted to ICU with severe hyponatremia  Consults:  Nephrology  Procedures:  None  Interim History / Subjective:      Micro Data:  6/09: Blood culture x2> 6/09:MRSA PCR>>   Antimicrobials:  Unasyn 6/9>  OBJECTIVE   Blood pressure 101/64, pulse 77, temperature 98.1 F (36.7 C), temperature source Oral, resp. rate 12, height 6' (1.829 m), weight 79.8 kg, SpO2 95%.        Intake/Output Summary (Last 24 hours) at 09/06/2023 0504 Last data filed at 09/06/2023 0443 Gross per 24 hour  Intake 465.9 ml  Output --  Net 465.9 ml   Filed Weights   09/05/23 1819 09/06/23 0000 09/06/23 0327  Weight: 67 kg 79.8 kg 79.8 kg     Physical  Examination  GENERAL: 68 year-old critically ill patient lying in the bed in no acute distress EYES: PEERLA. No scleral icterus. Extraocular muscles intact.  HEENT: Head atraumatic, normocephalic. Oropharynx and nasopharynx clear.  NECK:  No JVD, supple  LUNGS: Normal breath sounds bilaterally.  No use of accessory muscles of respiration.  CARDIOVASCULAR: S1, S2 normal. No murmurs, rubs, or gallops.  ABDOMEN: Soft, NTND EXTREMITIES: Multiple bruising and erythema of bilateral upper and lower extremities.  Capillary refill < 3 seconds in all extremities. Pulses palpable distally. NEUROLOGIC:mildly slurred speech and oriented to person, place, and time, but with difficulty with short-term memory. No focal neurological deficit appreciated. Cranial nerves are intact.  SKIN:Multiple bruising,  hematoma as below                Labs/imaging that I havepersonally reviewed  (right click and "Reselect all SmartList Selections" daily)     CT HEAD WO CONTRAST ( ) Result Date: 09/05/2023 CLINICAL DATA:  Fall, head injury EXAM: CT HEAD WITHOUT CONTRAST CT MAXILLOFACIAL WITHOUT CONTRAST CT CERVICAL SPINE WITHOUT CONTRAST TECHNIQUE: Multidetector CT imaging of the head, cervical spine, and maxillofacial structures were performed using the standard protocol without intravenous contrast. Multiplanar CT image reconstructions of the cervical spine and maxillofacial structures were also generated. RADIATION DOSE REDUCTION: This exam was performed according to the departmental dose-optimization program which includes automated exposure control, adjustment of the mA and/or kV according to patient size and/or use of iterative reconstruction technique. COMPARISON:  12/03/2020 FINDINGS: CT HEAD FINDINGS Brain: No evidence of acute infarction, hemorrhage, hydrocephalus, extra-axial collection or mass lesion/mass effect. Periventricular white matter hypodensity. Vascular: No hyperdense vessel or unexpected  calcification. CT FACIAL BONES FINDINGS Skull: Normal. Negative for fracture or focal lesion. Facial bones: No displaced fractures or dislocations. Sinuses/Orbits: No acute finding. Other: Multiple soft tissue contusions, including of the right forehead, overlying the right orbit, and right cheek, as well as the scalp vertex and right parietal scalp (series 2, image 18). Patient is edentulous. CT CERVICAL SPINE FINDINGS Alignment: Straightening of the normal cervical lordosis. Skull base and vertebrae: No acute fracture. No primary bone lesion or focal pathologic process. Soft tissues and spinal canal: No prevertebral fluid or swelling. No visible canal hematoma. Disc levels: Moderate multilevel cervical disc degenerative disease. Upper chest: Negative. Other: None. IMPRESSION: 1. No acute intracranial pathology. Small-vessel white matter disease. 2. No displaced fractures or dislocations of the facial bones. 3. Multiple soft tissue contusions, including of the right forehead, overlying the right orbit, and right cheek, as well as the scalp vertex and right parietal scalp. 4. No fracture or static subluxation of the cervical spine. 5. Moderate multilevel cervical disc degenerative disease. Electronically Signed   By: Fredricka Jenny M.D.   On: 09/05/2023 19:54   CT Maxillofacial Wo Contrast Result Date: 09/05/2023 CLINICAL DATA:  Fall, head injury EXAM: CT HEAD WITHOUT CONTRAST CT MAXILLOFACIAL WITHOUT CONTRAST CT CERVICAL SPINE WITHOUT CONTRAST TECHNIQUE: Multidetector CT imaging of the head, cervical spine, and maxillofacial structures were performed using the standard protocol without intravenous contrast. Multiplanar CT image reconstructions of the cervical spine and maxillofacial structures were also generated. RADIATION DOSE REDUCTION: This exam was performed according to the departmental dose-optimization program which includes automated exposure control, adjustment of the mA and/or kV according to patient  size and/or use of iterative reconstruction technique. COMPARISON:  12/03/2020 FINDINGS: CT HEAD FINDINGS Brain: No evidence of acute infarction, hemorrhage, hydrocephalus, extra-axial collection or mass lesion/mass effect. Periventricular white matter hypodensity. Vascular: No hyperdense vessel or unexpected calcification. CT FACIAL BONES FINDINGS Skull: Normal. Negative for fracture or focal lesion. Facial bones: No displaced fractures or dislocations. Sinuses/Orbits: No acute finding. Other: Multiple soft tissue contusions, including of the right forehead, overlying the right orbit, and right cheek, as well as the scalp vertex and right parietal scalp (series 2, image 18). Patient is edentulous. CT CERVICAL SPINE FINDINGS Alignment: Straightening of the normal cervical lordosis. Skull base and vertebrae: No acute fracture. No primary bone lesion or focal pathologic process. Soft tissues and spinal canal: No prevertebral fluid or swelling. No visible canal hematoma. Disc levels: Moderate multilevel cervical disc degenerative disease. Upper chest: Negative. Other: None. IMPRESSION: 1. No acute intracranial pathology. Small-vessel white matter disease. 2. No displaced fractures or dislocations of the facial bones. 3. Multiple soft tissue contusions, including of the right forehead, overlying the right orbit, and right cheek, as well as the scalp vertex and right parietal scalp. 4. No fracture or static subluxation of the cervical spine. 5. Moderate multilevel cervical disc degenerative disease. Electronically Signed   By: Fredricka Jenny M.D.   On: 09/05/2023 19:54   CT Cervical Spine Wo Contrast Result Date: 09/05/2023 CLINICAL DATA:  Fall, head injury EXAM: CT HEAD WITHOUT CONTRAST CT MAXILLOFACIAL WITHOUT CONTRAST CT CERVICAL SPINE WITHOUT CONTRAST TECHNIQUE: Multidetector CT imaging of the head, cervical spine, and maxillofacial structures were performed using the standard protocol without intravenous contrast.  Multiplanar CT image reconstructions of the cervical spine and maxillofacial  structures were also generated. RADIATION DOSE REDUCTION: This exam was performed according to the departmental dose-optimization program which includes automated exposure control, adjustment of the mA and/or kV according to patient size and/or use of iterative reconstruction technique. COMPARISON:  12/03/2020 FINDINGS: CT HEAD FINDINGS Brain: No evidence of acute infarction, hemorrhage, hydrocephalus, extra-axial collection or mass lesion/mass effect. Periventricular white matter hypodensity. Vascular: No hyperdense vessel or unexpected calcification. CT FACIAL BONES FINDINGS Skull: Normal. Negative for fracture or focal lesion. Facial bones: No displaced fractures or dislocations. Sinuses/Orbits: No acute finding. Other: Multiple soft tissue contusions, including of the right forehead, overlying the right orbit, and right cheek, as well as the scalp vertex and right parietal scalp (series 2, image 18). Patient is edentulous. CT CERVICAL SPINE FINDINGS Alignment: Straightening of the normal cervical lordosis. Skull base and vertebrae: No acute fracture. No primary bone lesion or focal pathologic process. Soft tissues and spinal canal: No prevertebral fluid or swelling. No visible canal hematoma. Disc levels: Moderate multilevel cervical disc degenerative disease. Upper chest: Negative. Other: None. IMPRESSION: 1. No acute intracranial pathology. Small-vessel white matter disease. 2. No displaced fractures or dislocations of the facial bones. 3. Multiple soft tissue contusions, including of the right forehead, overlying the right orbit, and right cheek, as well as the scalp vertex and right parietal scalp. 4. No fracture or static subluxation of the cervical spine. 5. Moderate multilevel cervical disc degenerative disease. Electronically Signed   By: Fredricka Jenny M.D.   On: 09/05/2023 19:54   DG Chest Portable 1 View Result Date:  09/05/2023 CLINICAL DATA:  fall EXAM: PORTABLE CHEST - 1 VIEW COMPARISON:  12/03/2020 FINDINGS: New left retrocardiac opacity.  Right lung clear. Heart size and mediastinal contours are within normal limits. Aortic Atherosclerosis (ICD10-170.0). No effusion. Old right lateral rib fracture deformities. 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 IMPRESSION: New left retrocardiac opacity. Electronically Signed   By: Nicoletta Barrier M.D.   On: 09/05/2023 19:53   DG Elbow 2 Views Right Result Date: 09/05/2023 CLINICAL DATA:  Recent fall with right elbow pain, initial encounter EXAM: RIGHT ELBOW - 2 VIEW COMPARISON:  None Available. FINDINGS: There is no evidence of fracture, dislocation, or joint effusion. There is no evidence of arthropathy or other focal bone abnormality. Soft tissues are unremarkable. IMPRESSION: No acute abnormality noted. Electronically Signed   By: Violeta Grey M.D.   On: 09/05/2023 19:47    Labs   CBC: Recent Labs  Lab 09/05/23 1755  WBC 13.9*  NEUTROABS 10.5*  HGB 12.1*  HCT RESULTS UNAVAILABLE DUE TO INTERFERING SUBSTANCE  MCV RESULTS UNAVAILABLE DUE TO INTERFERING SUBSTANCE  PLT 290    Basic Metabolic Panel: Recent Labs  Lab 09/05/23 1755 09/05/23 2025 09/06/23 0112 09/06/23 0321  NA 104* 107* 109* 110*  K 3.7  --  3.4* 3.3*  CL 70*  --  74* 76*  CO2 17*  --  19* 20*  GLUCOSE 78  --  75 75  BUN 9  --  6* 7*  CREATININE 0.51*  --  0.62 0.53*  CALCIUM 7.5*  --  7.8* 7.5*  MG 1.8 1.8  --  1.9  PHOS  --   --   --  2.0*   GFR: Estimated Creatinine Clearance: 99.7 mL/min (A) (by C-G formula based on SCr of 0.53 mg/dL (L)). Recent Labs  Lab 09/05/23 1755 09/05/23 2325 09/06/23 0347  PROCALCITON  --  0.25  --   WBC 13.9*  --   --  LATICACIDVEN  --   --  1.4    Liver Function Tests: Recent Labs  Lab 09/05/23 1755  AST 123*  ALT 42  ALKPHOS 48  BILITOT 1.9*  PROT 6.0*  ALBUMIN 2.8*   No results for input(s): "LIPASE", "AMYLASE" in the last  168 hours. No results for input(s): "AMMONIA" in the last 168 hours.  ABG No results found for: "PHART", "PCO2ART", "PO2ART", "HCO3", "TCO2", "ACIDBASEDEF", "O2SAT"   Coagulation Profile: No results for input(s): "INR", "PROTIME" in the last 168 hours.  Cardiac Enzymes: Recent Labs  Lab 09/05/23 1755  CKTOTAL 3,815*    HbA1C: No results found for: "HGBA1C"  CBG: No results for input(s): "GLUCAP" in the last 168 hours.  Review of Systems:   Unable to be obtained secondary to the patient's altered mental status.   Past Medical History  He,  has a past medical history of Hypertension.   Surgical History    Past Surgical History:  Procedure Laterality Date   EYE SURGERY       Social History   reports that he has been smoking cigarettes. He has a 42 pack-year smoking history. He has never used smokeless tobacco. He reports current alcohol use.   Family History   His family history is not on file.   Allergies No Known Allergies   Home Medications  Prior to Admission medications   Medication Sig Start Date End Date Taking? Authorizing Provider  losartan (COZAAR) 25 MG tablet Take 25 mg by mouth daily. 06/20/23  Yes [provider]  pravastatin (PRAVACHOL) 20 MG tablet Take 20 mg by mouth daily. 10/16/19  Yes [provider]  folic acid  (FOLVITE ) 1 MG tablet Take 1 tablet (1 mg total) by mouth daily. 12/11/20   Rayfield Cairo, MD  Multiple Vitamin (MULTIVITAMIN WITH MINERALS) TABS tablet Take 1 tablet by mouth daily. 01/19/20   Mason Sole, Pratik D, DO  pantoprazole  (PROTONIX ) 40 MG tablet Take 1 tablet (40 mg total) by mouth 2 (two) times daily. 01/18/20 01/17/21  Mason Sole, Pratik D, DO  polyethylene glycol (MIRALAX  / GLYCOLAX ) 17 g packet Take 17 g by mouth daily as needed for mild constipation. 12/10/20   Johnson, Clanford L, MD  thiamine  100 MG tablet Take 1 tablet (100 mg total) by mouth daily. 01/19/20   Mason Sole, Pratik D, DO  Scheduled Meds:  folic acid   1  mg Oral Daily   multivitamin with minerals  1 tablet Oral Daily   thiamine   100 mg Oral Daily   Continuous Infusions:  sodium chloride  100 mL/hr at 09/06/23 0204   potassium PHOSPHATE IVPB (in mmol)     thiamine  (VITAMIN B1) injection     PRN Meds:.chlorpheniramine-HYDROcodone, docusate sodium, LORazepam , polyethylene glycol  Active Hospital Problem list   See systems below  Assessment & Plan:  #Acute on Chronic Hypotonic Hyponatremia The patient's poor nutritional status, and significant history of alcoholism with recent binge drinking 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 Na+ of 104 on admission   -Received Hypertonic saline bolus, will hold further correction to avoid Na+ over-correction and complication of osmotic demyelination syndrome. -Expect rapid auto correction with minimal intervention for goal serum sodium level not > 10 to 12 mEq per L in the first 24 hours and 18 mEq per L in the first 48 hours -Frequent neuro checks -Keep NPO -Check serial sodium q2 hours  #Hypokalemia #Anion gap metabolic acidosis  #Acute rhabdo myolysis-likely traumatic from muscle injury and prolonged downtime has a large right  thigh hematoma -Trend Lactate and CK -Monitor I&O's / urinary output -Follow BMP -Replace electrolytes as indicated  #Leukocytosis likely reactive however cannot r/o infectious process No obvious source of infection, chest x-ray and UA negative -F/u cultures, trend lactic/ PCT -Monitor WBC/ fever curve -Hold antibiotics for now pending cultures -Strict I/O's  #New onset Atrial Flutter/Atrial Fibrillation -trend troponins -Check TSH, FT4 -EtOH, UTox -CXR no abnormality -Start Heparin  -TTEcho  #Alcoholic hepatitis AST/ALT:123/24 -Consider RUQ ultrasonography with Doppler if levels trending up, to evaluate for portal or hepatic vein thrombosis. -Trend liver function test  #EtOH Abuse high risk for withdrawal Average Drinks 12 beers per  day Last Drink last 24 hours ?Hx Seizures / DTs  -EtOH, Utox neg -Follow CMP, INR, Daily BMP+Mg -Daily Thiamine , Folate, MVI once tolerating PO -CIWA protocol -SW consult for cessation resources -PT/OT evaluation for mobility  #At Risk for Glucose impairment -CBG's q4; while NPO -Target range of 140 to 180, avoid hypoglycemia -SSI -Follow ICU Hypo/Hyperglycemia protocol   #Multiple Injuries from recurrent Falls Right eye bruise, right orbital frontal hematoma, right arm and thigh bruising -CT Head, cervical and maxillary with no fractures or acute abnormalities -falls precaution  Best practice:  Diet:  NPO Pain/Anxiety/Delirium protocol (if indicated): No VAP protocol (if indicated): Not indicated DVT prophylaxis: Subcutaneous Heparin  GI prophylaxis: PPI Glucose control:  SSI Yes Central venous access:  N/A Arterial line:  N/A Foley:  N/A Mobility:  bed rest  PT consulted: Yes Last date of multidisciplinary goals of care discussion [no family at bedside] Code Status:  full code Disposition:ICU  = Goals of Care = Code Status Order: FULL  Primary Emergency Contact: parker,Jan, Home Phone: [phone]   Critical care time: 45 minutes        Alonza Arthurs DNP, CCRN, FNP-C, AGACNP-BC Acute Care & Family Nurse Practitioner [facility] PCCM on call pager [phone]

## 2023-09-05 NOTE — ED Provider Notes (Signed)
 [facility] Provider Note    Event Date/Time   First MD Initiated Contact with Patient 09/05/23 1754     (approximate)   History   Fall   HPI  Barry Fowler is a 68 y.o. male who presents to the ED for evaluation of Fall   Review of medical DC summary from 2022 when he was admitted for hyponatremia.  Alcoholic patient.  Patient presents after his sister found him down and he was not acting right at home.  She checks in on him a few times per week and reports that he has been on a alcohol binge.  Patient denies any attempt at self-harm.  History is somewhat limited   Physical Exam   Triage Vital Signs: ED Triage Vitals  Encounter Vitals Group     BP      Systolic BP Percentile      Diastolic BP Percentile      Pulse      Resp      Temp      Temp src      SpO2      Weight      Height      Head Circumference      Peak Flow      Pain Score      Pain Loc      Pain Education      Exclude from Growth Chart     Most recent vital signs: Vitals:   09/05/23 1915 09/05/23 2030  BP:  94/67  Pulse: 99 85  Resp: 18 18  Temp:    SpO2: 98% 98%    General: Awake, no distress.  Right-sided periorbital ecchymoses.  No proptosis or signs of EOM entrapment.  Able to pry open his eyelids and few PERRL pupils without signs of open globe CV:  Good peripheral perfusion.  Resp:  Normal effort.  Abd:  No distention.  MSK:  No deformity noted.  Neuro:  No focal deficits appreciated. Other:     ED Results / Procedures / Treatments   Labs (all labs ordered are listed, but only abnormal results are displayed) Labs Reviewed  COMPREHENSIVE METABOLIC PANEL WITH GFR - Abnormal; Notable for the following components:      Result Value   Sodium 104 (*)    Chloride 70 (*)    CO2 17 (*)    Creatinine, Ser 0.51 (*)    Calcium 7.5 (*)    Total Protein 6.0 (*)    Albumin 2.8 (*)    AST 123 (*)    Total Bilirubin 1.9 (*)    Anion gap 17 (*)    All other  components within normal limits  CBC WITH DIFFERENTIAL/PLATELET - Abnormal; Notable for the following components:   WBC 13.9 (*)    Hemoglobin 12.1 (*)    Neutro Abs 10.5 (*)    Monocytes Absolute 1.5 (*)    Abs Immature Granulocytes 0.13 (*)    All other components within normal limits  URINALYSIS, ROUTINE W REFLEX MICROSCOPIC - Abnormal; Notable for the following components:   Color, Urine YELLOW (*)    APPearance CLEAR (*)    Glucose, UA 150 (*)    Hgb urine dipstick SMALL (*)    Ketones, ur 80 (*)    Protein, ur 30 (*)    All other components within normal limits  CK - Abnormal; Notable for the following components:   Total CK 3,815 (*)    All other components  within normal limits  MRSA NEXT GEN BY PCR, NASAL  ETHANOL  MAGNESIUM   URINE DRUG SCREEN, QUALITATIVE ([facility] ONLY)  SODIUM  SODIUM  OSMOLALITY  HIV ANTIBODY (ROUTINE TESTING W REFLEX)  MAGNESIUM   PHOSPHORUS  MAGNESIUM   BASIC METABOLIC PANEL WITH GFR  CBC  SODIUM, URINE, RANDOM  OSMOLALITY, URINE    EKG Sinus rhythm with a rate of 108 bpm.  Normal axis and intervals.  No STEMI.  Treatment is baseline clouds fine detail.  RADIOLOGY CT head interpreted by me without evidence of acute intracranial pathology CT cervical spine interpreted by me without evidence of fracture or dislocation CT maxillofacial interpreted by me without fracture  Official radiology report(s): CT HEAD WO CONTRAST ( ) Result Date: 09/05/2023 CLINICAL DATA:  Fall, head injury EXAM: CT HEAD WITHOUT CONTRAST CT MAXILLOFACIAL WITHOUT CONTRAST CT CERVICAL SPINE WITHOUT CONTRAST TECHNIQUE: Multidetector CT imaging of the head, cervical spine, and maxillofacial structures were performed using the standard protocol without intravenous contrast. Multiplanar CT image reconstructions of the cervical spine and maxillofacial structures were also generated. RADIATION DOSE REDUCTION: This exam was performed according to the departmental dose-optimization  program which includes automated exposure control, adjustment of the mA and/or kV according to patient size and/or use of iterative reconstruction technique. COMPARISON:  12/03/2020 FINDINGS: CT HEAD FINDINGS Brain: No evidence of acute infarction, hemorrhage, hydrocephalus, extra-axial collection or mass lesion/mass effect. Periventricular white matter hypodensity. Vascular: No hyperdense vessel or unexpected calcification. CT FACIAL BONES FINDINGS Skull: Normal. Negative for fracture or focal lesion. Facial bones: No displaced fractures or dislocations. Sinuses/Orbits: No acute finding. Other: Multiple soft tissue contusions, including of the right forehead, overlying the right orbit, and right cheek, as well as the scalp vertex and right parietal scalp (series 2, image 18). Patient is edentulous. CT CERVICAL SPINE FINDINGS Alignment: Straightening of the normal cervical lordosis. Skull base and vertebrae: No acute fracture. No primary bone lesion or focal pathologic process. Soft tissues and spinal canal: No prevertebral fluid or swelling. No visible canal hematoma. Disc levels: Moderate multilevel cervical disc degenerative disease. Upper chest: Negative. Other: None. IMPRESSION: 1. No acute intracranial pathology. Small-vessel white matter disease. 2. No displaced fractures or dislocations of the facial bones. 3. Multiple soft tissue contusions, including of the right forehead, overlying the right orbit, and right cheek, as well as the scalp vertex and right parietal scalp. 4. No fracture or static subluxation of the cervical spine. 5. Moderate multilevel cervical disc degenerative disease. Electronically Signed   By: Fredricka Jenny M.D.   On: 09/05/2023 19:54   CT Maxillofacial Wo Contrast Result Date: 09/05/2023 CLINICAL DATA:  Fall, head injury EXAM: CT HEAD WITHOUT CONTRAST CT MAXILLOFACIAL WITHOUT CONTRAST CT CERVICAL SPINE WITHOUT CONTRAST TECHNIQUE: Multidetector CT imaging of the head, cervical spine,  and maxillofacial structures were performed using the standard protocol without intravenous contrast. Multiplanar CT image reconstructions of the cervical spine and maxillofacial structures were also generated. RADIATION DOSE REDUCTION: This exam was performed according to the departmental dose-optimization program which includes automated exposure control, adjustment of the mA and/or kV according to patient size and/or use of iterative reconstruction technique. COMPARISON:  12/03/2020 FINDINGS: CT HEAD FINDINGS Brain: No evidence of acute infarction, hemorrhage, hydrocephalus, extra-axial collection or mass lesion/mass effect. Periventricular white matter hypodensity. Vascular: No hyperdense vessel or unexpected calcification. CT FACIAL BONES FINDINGS Skull: Normal. Negative for fracture or focal lesion. Facial bones: No displaced fractures or dislocations. Sinuses/Orbits: No acute finding. Other: Multiple soft tissue contusions, including of the right forehead,  overlying the right orbit, and right cheek, as well as the scalp vertex and right parietal scalp (series 2, image 18). Patient is edentulous. CT CERVICAL SPINE FINDINGS Alignment: Straightening of the normal cervical lordosis. Skull base and vertebrae: No acute fracture. No primary bone lesion or focal pathologic process. Soft tissues and spinal canal: No prevertebral fluid or swelling. No visible canal hematoma. Disc levels: Moderate multilevel cervical disc degenerative disease. Upper chest: Negative. Other: None. IMPRESSION: 1. No acute intracranial pathology. Small-vessel white matter disease. 2. No displaced fractures or dislocations of the facial bones. 3. Multiple soft tissue contusions, including of the right forehead, overlying the right orbit, and right cheek, as well as the scalp vertex and right parietal scalp. 4. No fracture or static subluxation of the cervical spine. 5. Moderate multilevel cervical disc degenerative disease. Electronically  Signed   By: Fredricka Jenny M.D.   On: 09/05/2023 19:54   CT Cervical Spine Wo Contrast Result Date: 09/05/2023 CLINICAL DATA:  Fall, head injury EXAM: CT HEAD WITHOUT CONTRAST CT MAXILLOFACIAL WITHOUT CONTRAST CT CERVICAL SPINE WITHOUT CONTRAST TECHNIQUE: Multidetector CT imaging of the head, cervical spine, and maxillofacial structures were performed using the standard protocol without intravenous contrast. Multiplanar CT image reconstructions of the cervical spine and maxillofacial structures were also generated. RADIATION DOSE REDUCTION: This exam was performed according to the departmental dose-optimization program which includes automated exposure control, adjustment of the mA and/or kV according to patient size and/or use of iterative reconstruction technique. COMPARISON:  12/03/2020 FINDINGS: CT HEAD FINDINGS Brain: No evidence of acute infarction, hemorrhage, hydrocephalus, extra-axial collection or mass lesion/mass effect. Periventricular white matter hypodensity. Vascular: No hyperdense vessel or unexpected calcification. CT FACIAL BONES FINDINGS Skull: Normal. Negative for fracture or focal lesion. Facial bones: No displaced fractures or dislocations. Sinuses/Orbits: No acute finding. Other: Multiple soft tissue contusions, including of the right forehead, overlying the right orbit, and right cheek, as well as the scalp vertex and right parietal scalp (series 2, image 18). Patient is edentulous. CT CERVICAL SPINE FINDINGS Alignment: Straightening of the normal cervical lordosis. Skull base and vertebrae: No acute fracture. No primary bone lesion or focal pathologic process. Soft tissues and spinal canal: No prevertebral fluid or swelling. No visible canal hematoma. Disc levels: Moderate multilevel cervical disc degenerative disease. Upper chest: Negative. Other: None. IMPRESSION: 1. No acute intracranial pathology. Small-vessel white matter disease. 2. No displaced fractures or dislocations of the  facial bones. 3. Multiple soft tissue contusions, including of the right forehead, overlying the right orbit, and right cheek, as well as the scalp vertex and right parietal scalp. 4. No fracture or static subluxation of the cervical spine. 5. Moderate multilevel cervical disc degenerative disease. Electronically Signed   By: Fredricka Jenny M.D.   On: 09/05/2023 19:54   DG Chest Portable 1 View Result Date: 09/05/2023 CLINICAL DATA:  fall EXAM: PORTABLE CHEST - 1 VIEW COMPARISON:  12/03/2020 FINDINGS: New left retrocardiac opacity.  Right lung clear. Heart size and mediastinal contours are within normal limits. Aortic Atherosclerosis (ICD10-170.0). No effusion. Old right lateral rib fracture deformities. 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 IMPRESSION: New left retrocardiac opacity. Electronically Signed   By: Nicoletta Barrier M.D.   On: 09/05/2023 19:53   DG Elbow 2 Views Right Result Date: 09/05/2023 CLINICAL DATA:  Recent fall with right elbow pain, initial encounter EXAM: RIGHT ELBOW - 2 VIEW COMPARISON:  None Available. FINDINGS: There is no evidence of fracture, dislocation, or joint effusion. There is no  evidence of arthropathy or other focal bone abnormality. Soft tissues are unremarkable. IMPRESSION: No acute abnormality noted. Electronically Signed   By: Violeta Grey M.D.   On: 09/05/2023 19:47    PROCEDURES and INTERVENTIONS:  .Critical Care  Performed by: Arline Bennett, MD Authorized by: Arline Bennett, MD   Critical care provider statement:    Critical care time (minutes):  30   Critical care time was exclusive of:  Separately billable procedures and treating other patients   Critical care was necessary to treat or prevent imminent or life-threatening deterioration of the following conditions:  Metabolic crisis   Critical care was time spent personally by me on the following activities:  Development of treatment plan with patient or surrogate, discussions with consultants,  evaluation of patient's response to treatment, examination of patient, ordering and review of laboratory studies, ordering and review of radiographic studies, ordering and performing treatments and interventions, pulse oximetry, re-evaluation of patient's condition and review of old charts .1-3 Lead EKG Interpretation  Performed by: Arline Bennett, MD Authorized by: Arline Bennett, MD     Interpretation: normal     ECG rate:  90   ECG rate assessment: normal     Rhythm: sinus rhythm     Ectopy: none     Conduction: normal     Medications  docusate sodium (COLACE) capsule 100 mg (has no administration in time range)  polyethylene glycol (MIRALAX  / GLYCOLAX ) packet 17 g (has no administration in time range)  Ampicillin-Sulbactam (UNASYN) 3 g in sodium chloride  0.9 % 100 mL IVPB (has no administration in time range)  lactated ringers bolus 1,000 mL (0 mLs Intravenous Stopped 09/05/23 1948)  ondansetron  (ZOFRAN ) injection 4 mg (4 mg Intravenous Given 09/05/23 1812)  sodium chloride  3% (hypertonic) IV bolus 100 mL (0 mLs Intravenous Stopped 09/05/23 2016)     IMPRESSION / MDM / ASSESSMENT AND PLAN / ED COURSE  I reviewed the triage vital signs and the nursing notes.  Differential diagnosis includes, but is not limited to, stroke, ICH, Warnicke encephalopathy, alcohol intoxication, withdrawals, seizure or postictal, ultralight derangement  {Patient presents with symptoms of an acute illness or injury that is potentially life-threatening.  Patient presents with evidence of severe hyponatremia requiring ICU admission.  Bruised face without neurologic deficits on exam.  Imaging without signs of significant traumatic injury.  Retrocardiac opacity is noted and he is provided Unasyn.  Ketones in the urine, elevated CK and metabolic panel with small anion gap likely degree of starvation ketosis in the setting of his ethanol binge.  He is started on hypertonic saline and admitted to the ICU.  Clinical Course  as of 09/05/23 2053  Mon Sep 05, 2023  Manus Sellers, sister at the bedside.  Discussed low sodium, ICU admission and plan of care [DS]  1937 I consult with ICU who agrees to admit. [DS]    Clinical Course User Index [DS] Arline Bennett, MD     FINAL CLINICAL IMPRESSION(S) / ED DIAGNOSES   Final diagnoses:  Hyponatremia     Rx / DC Orders   ED Discharge Orders     None        Note:  This document was prepared using Dragon voice recognition software and may include unintentional dictation errors.   Arline Bennett, MD 09/05/23 2053

## 2023-09-05 NOTE — Progress Notes (Signed)
 PHARMACY CONSULT NOTE - ELECTROLYTES  Pharmacy Consult for Electrolyte Monitoring and Replacement   Recent Labs: Height: 6' (182.9 cm) Weight: 67 kg (147 lb 11.3 oz) IBW/kg (Calculated) : 77.6 Estimated Creatinine Clearance: 86.1 mL/min (A) (by C-G formula based on SCr of 0.51 mg/dL (L)). Potassium (mmol/L)  Date Value  09/05/2023 3.7  03/24/2013 4.1   Magnesium  (mg/dL)  Date Value  16/12/9602 1.8   Calcium (mg/dL)  Date Value  54/11/8117 7.5 (L)   Calcium, Total (mg/dL)  Date Value  14/78/2956 8.8   Albumin (g/dL)  Date Value  21/30/8657 2.8 (L)  03/24/2013 3.6   Phosphorus (mg/dL)  Date Value  84/69/6295 2.4 (L)   Sodium (mmol/L)  Date Value  09/05/2023 104 (LL)  03/24/2013 136   Corrected Ca: 8.5 mg/dL  Assessment  Barry Fowler is a 68 y.o. male presenting fall at home with unknown downtime. PMH significant for HTN, hyperlipidemia, current smoker. Pharmacy has been consulted to monitor and replace electrolytes.  MIVF: N/A Pertinent medications: N/A  Goal of Therapy: Electrolytes WNL  Plan:  Magnesium  1.8: Give magnesium  sulfate 2 g IV x1 Sodium 104: CCM provider ordered 3% NaCl 100 mL bolus x1 Sodium levels ordered q2 hours Check BMP, Mg, Phos with AM labs  Thank you for allowing pharmacy to be a part of this patient's care.  Ananias Balls, PharmD Clinical Pharmacist 09/05/2023 8:13 PM

## 2023-09-06 ENCOUNTER — Other Ambulatory Visit: Payer: Self-pay

## 2023-09-06 DIAGNOSIS — E46 Unspecified protein-calorie malnutrition: Secondary | ICD-10-CM

## 2023-09-06 DIAGNOSIS — I4892 Unspecified atrial flutter: Secondary | ICD-10-CM

## 2023-09-06 DIAGNOSIS — E871 Hypo-osmolality and hyponatremia: Principal | ICD-10-CM

## 2023-09-06 DIAGNOSIS — F1090 Alcohol use, unspecified, uncomplicated: Secondary | ICD-10-CM

## 2023-09-06 DIAGNOSIS — I10 Essential (primary) hypertension: Secondary | ICD-10-CM

## 2023-09-06 DIAGNOSIS — E785 Hyperlipidemia, unspecified: Secondary | ICD-10-CM

## 2023-09-06 LAB — BASIC METABOLIC PANEL WITH GFR
Anion gap: 16 — ABNORMAL HIGH (ref 5–15)
Anion gap: 14 (ref 5–15)
BUN: 6 mg/dL — ABNORMAL LOW (ref 8–23)
BUN: 7 mg/dL — ABNORMAL LOW (ref 8–23)
CO2: 19 mmol/L — ABNORMAL LOW (ref 22–32)
CO2: 20 mmol/L — ABNORMAL LOW (ref 22–32)
Calcium: 7.8 mg/dL — ABNORMAL LOW (ref 8.9–10.3)
Calcium: 7.5 mg/dL — ABNORMAL LOW (ref 8.9–10.3)
Chloride: 74 mmol/L — ABNORMAL LOW (ref 98–111)
Chloride: 76 mmol/L — ABNORMAL LOW (ref 98–111)
Creatinine, Ser: 0.62 mg/dL (ref 0.61–1.24)
Creatinine, Ser: 0.53 mg/dL — ABNORMAL LOW (ref 0.61–1.24)
GFR, Estimated: >60 mL/min (ref >60)
GFR, Estimated: >60 mL/min (ref >60)
Glucose, Bld: 75 mg/dL (ref 70–99)
Glucose, Bld: 75 mg/dL (ref 70–99)
Potassium: 3.4 mmol/L — ABNORMAL LOW (ref 3.5–5.1)
Potassium: 3.3 mmol/L — ABNORMAL LOW (ref 3.5–5.1)
Sodium: 109 mmol/L — CL (ref 135–145)
Sodium: 110 mmol/L — CL (ref 135–145)

## 2023-09-06 LAB — SODIUM
Sodium: 110 mmol/L — CL (ref 135–145)
Sodium: 112 mmol/L — CL (ref 135–145)
Sodium: 113 mmol/L — CL (ref 135–145)
Sodium: 114 mmol/L — CL (ref 135–145)
Sodium: 115 mmol/L — CL (ref 135–145)
Sodium: 114 mmol/L — CL (ref 135–145)
Sodium: 109 mmol/L — CL (ref 135–145)
Sodium: 114 mmol/L — CL (ref 135–145)

## 2023-09-06 LAB — CBC
Hemoglobin: 11.6 g/dL — ABNORMAL LOW (ref 13.0–17.0)
Platelets: 313 10*3/uL (ref 150–400)
WBC: 13.3 10*3/uL — ABNORMAL HIGH (ref 4.0–10.5)

## 2023-09-06 LAB — HIV ANTIBODY (ROUTINE TESTING W REFLEX): HIV Screen 4th Generation wRfx: NONREACTIVE

## 2023-09-06 LAB — PROCALCITONIN: Procalcitonin: 0.25 ng/mL

## 2023-09-06 LAB — GLUCOSE, CAPILLARY
Glucose-Capillary: 108 mg/dL — ABNORMAL HIGH (ref 70–99)
Glucose-Capillary: 71 mg/dL (ref 70–99)
Glucose-Capillary: 98 mg/dL (ref 70–99)

## 2023-09-06 LAB — TSH: TSH: 0.635 u[IU]/mL (ref 0.350–4.500)

## 2023-09-06 LAB — T4, FREE: Free T4: 1.18 ng/dL — ABNORMAL HIGH (ref 0.61–1.12)

## 2023-09-06 LAB — LACTIC ACID, PLASMA: Lactic Acid, Venous: 1.4 mmol/L (ref 0.5–1.9)

## 2023-09-06 LAB — PHOSPHORUS: Phosphorus: 2 mg/dL — ABNORMAL LOW (ref 2.5–4.6)

## 2023-09-06 LAB — TROPONIN I (HIGH SENSITIVITY)
Troponin I (High Sensitivity): 21 ng/L — ABNORMAL HIGH (ref <18)
Troponin I (High Sensitivity): 15 ng/L (ref <18)

## 2023-09-06 LAB — MAGNESIUM: Magnesium: 1.9 mg/dL (ref 1.7–2.4)

## 2023-09-06 MED ORDER — HYDROCOD POLI-CHLORPHE POLI ER 10-8 MG/5ML PO SUER
5.0000 mL | Freq: Two times a day (BID) | ORAL | Status: DC | PRN
  Administered 2023-09-08: 5 mL via ORAL
  Filled 2023-09-06: qty 5

## 2023-09-06 MED ORDER — ORAL CARE MOUTH RINSE: 15.0000 mL | OROMUCOSAL | Status: DC | PRN

## 2023-09-06 MED ORDER — PHENOBARBITAL SODIUM 65 MG/ML IJ SOLN
32.5000 mg | Freq: Three times a day (TID) | INTRAMUSCULAR | Status: AC
  Administered 2023-09-10 – 2023-09-12 (×6): 32.5 mg via INTRAVENOUS
  Filled 2023-09-06: qty 1
  Filled 2023-09-06: qty 0.5
  Filled 2023-09-06 (×2): qty 1
  Filled 2023-09-06: qty 0.5
  Filled 2023-09-06: qty 1

## 2023-09-06 MED ORDER — THIAMINE HCL 100 MG/ML IJ SOLN: 100.0000 mg | Freq: Every day | INTRAMUSCULAR | Status: DC

## 2023-09-06 MED ORDER — ENOXAPARIN SODIUM 40 MG/0.4ML IJ SOSY
40.0000 mg | PREFILLED_SYRINGE | INTRAMUSCULAR | Status: DC
  Administered 2023-09-06 – 2023-09-10 (×5): 40 mg via SUBCUTANEOUS
  Filled 2023-09-06 (×5): qty 0.4

## 2023-09-06 MED ORDER — POTASSIUM PHOSPHATES 15 MMOLE/5ML IV SOLN
30.0000 mmol | Freq: Once | INTRAVENOUS | Status: DC
  Filled 2023-09-06: qty 10

## 2023-09-06 MED ORDER — SODIUM CHLORIDE 0.9 % IV SOLN
100.0000 mg | Freq: Two times a day (BID) | INTRAVENOUS | Status: AC
  Administered 2023-09-06 – 2023-09-10 (×9): 100 mg via INTRAVENOUS
  Filled 2023-09-06 (×9): qty 100

## 2023-09-06 MED ORDER — CHLORHEXIDINE GLUCONATE CLOTH 2 % EX PADS
6.0000 | MEDICATED_PAD | Freq: Every day | CUTANEOUS | Status: DC
  Administered 2023-09-06 – 2023-09-08 (×3): 6 via TOPICAL

## 2023-09-06 MED ORDER — SODIUM CHLORIDE 0.9 % IV SOLN
1.0000 g | INTRAVENOUS | Status: AC
  Administered 2023-09-06 – 2023-09-10 (×5): 1 g via INTRAVENOUS
  Filled 2023-09-06 (×5): qty 10

## 2023-09-06 MED ORDER — DEXTROSE 5 % IV SOLN
20.0000 ug | Freq: Once | INTRAVENOUS | Status: AC
  Administered 2023-09-06: 20 ug via INTRAVENOUS
  Filled 2023-09-06: qty 5

## 2023-09-06 MED ORDER — POTASSIUM PHOSPHATES 15 MMOLE/5ML IV SOLN
30.0000 mmol | Freq: Once | INTRAVENOUS | Status: AC
  Administered 2023-09-06: 30 mmol via INTRAVENOUS
  Filled 2023-09-06: qty 10

## 2023-09-06 MED ORDER — THIAMINE HCL 100 MG/ML IJ SOLN
500.0000 mg | Freq: Every day | INTRAVENOUS | Status: DC
  Administered 2023-09-07 – 2023-09-08 (×2): 500 mg via INTRAVENOUS
  Filled 2023-09-06 (×3): qty 5

## 2023-09-06 MED ORDER — PHENOBARBITAL SODIUM 130 MG/ML IJ SOLN
65.0000 mg | Freq: Three times a day (TID) | INTRAMUSCULAR | Status: AC
  Administered 2023-09-08 – 2023-09-10 (×6): 65 mg via INTRAVENOUS
  Filled 2023-09-06 (×6): qty 1

## 2023-09-06 MED ORDER — PHENOBARBITAL SODIUM 130 MG/ML IJ SOLN
97.5000 mg | Freq: Three times a day (TID) | INTRAMUSCULAR | Status: AC
  Administered 2023-09-06 – 2023-09-08 (×6): 97.5 mg via INTRAVENOUS
  Filled 2023-09-06 (×6): qty 1

## 2023-09-06 NOTE — Progress Notes (Signed)
 Received consult on behalf of pt for midline placement due to frequent blood draws. Recommended central line or lab draws due to the midline's unreliability for consistent blood return. Pt is also scheduled to receive doxycycline which is not recommended to be infused through a midline. Pt has two PIVs documented in flowsheets that the primary RN confirmed were working well. Primary RN requested a third PIV. Educated primary RN that third PIV was not indicated at this time due to the patient having two working PIVs and only 2 scheduled antibiotics at this time. Recommended central line placement at this time

## 2023-09-06 NOTE — Progress Notes (Signed)
 NAME:  Barry Fowler, MRN:  [id number], DOB:  April 14, 1955, LOS: 1 ADMISSION DATE:  09/05/2023  History of Present Illness:  68 y.o male with significant PMH of EtOH abuse, tobacco abuse, hypertension, chronic hyponatremia and recurrent falls who presented to the ED with chief complaints of fall.   Per ED reports, patient was found down by sister who called EMS.  It is unclear how long he has been down but reported he fell on Thursday and sustained multiple bruises.  Patient sister reported he has been binge drinking and she normally checks on him a few times per week.   ED Course: Initial vital signs showed HR of 104 beats/minute, BP 128/78 mm Hg, the RR 18 breaths/minute, and the oxygen saturation 96% on RA and a temperature of 98.68F (36.7C).  Pertinent Labs/Diagnostics Findings: Na+/ K+: 104/3.7.  CO2 17.  Anion gap 17 glucose: 78 Calcium: 7.5 AST/ALT: 123/42 Serum osmolality 221 CK 3850 WBC: 13.9 CXR> CTH> CT Maxillary>CT cervical> negative Medication administered in the ED: 3% hypertonic solution 100 mL, 1L LR bolus and Unasyn Disposition:ICU  Significant Hospital Events: Including procedures, antibiotic start and stop dates in addition to other pertinent events   09/05/2023 - Admitted to the ICU with severe hyponatremia and started on gentle hydration with NS.   Interim History / Subjective:  Patient remains lethargic despite good correction of sodium to 110.   Objective    Blood pressure 94/73, pulse 74, temperature 98.3 F (36.8 C), temperature source Oral, resp. rate 18, height 6' (1.829 m), weight 79.8 kg, SpO2 97%.        Intake/Output Summary (Last 24 hours) at 09/06/2023 0954 Last data filed at 09/06/2023 0900 Gross per 24 hour  Intake 517.9 ml  Output 650 ml  Net -132.1 ml   Filed Weights   09/05/23 1819 09/06/23 0000 09/06/23 0327  Weight: 67 kg 79.8 kg 79.8 kg    Examination: General: Lethargic and unable to answer questions HENT: Supple neck, reactive pupils   Lungs: Clear bilateral air entry Cardiovascular: Normal S1, Normal S2, RRR Abdomen: Soft, non tender, non distended, + BS Extremities: Warm and well perfused  Labs and imaging were reviewed.   Assessment and Plan  Barry Fowler is a 68 year old male patient with a past medical history of alcohol abuse, tobacco abuse, hypertension, chronic hyponatremia with sodium between 125 and 130 presenting to the ED with chief complaint of fall.  He was found to have severe hyponatremia with sodium of 104.  Thought to be secondary to decreased salt intake, dehydration and binge drinking with possible beer potomania.   #Severe hyposmolar hyponatremia with increased urine osm and decreased urine sodium consistent with hypovolemia, some component of decreased salt intake.  #Alcohol use disorder with high risk for withdrawals #CAP vs Aspiration pneumonia  #Hypertension and Hyperlipidemia  #Malnutrition   Neuro: CIWA protocol. Phenobarb taper.  Thiamine  500 IV for 3 doses then 100 daily.  Multivitamin.  Replete Phos. CVS: Stable maintain MAP greater than 65. Pulm/ID: Ceftriaxone  doxycycline.  MRSA swab.  SpO2 goal greater than 90%. GI: Replating Phos.  Keep n.p.o. for now.  Lacks as needed. Renal: Q2 sodium checks.  Goal is 110 to 112 x 6 p.m. today.  Appreciate nephrology consult. Endo: POC 140-180. Heme: Lovenox for dvt prophylaxis.   Best Practice (right click and "Reselect all SmartList Selections" daily)   Diet/type: NPO DVT prophylaxis LMWH Pressure ulcer(s): N/A GI prophylaxis: N/A Lines: N/A Foley:  N/A Code Status:  full code  Last  date of multidisciplinary goals of care discussion [09/06/2023]  Critical care time: 60 minutes    Annitta Kindler, MD [facility] 09/06/2023 12:33 PM

## 2023-09-06 NOTE — Progress Notes (Signed)
 eLink Physician-Brief Progress Note Patient Name: Barry Fowler DOB: 04-24-1955 MRN: [id number]   Date of Service  09/06/2023  HPI/Events of Note  eICU Brief new admit note: 68 yr old male alcohol abuse, in ICU for severe hypotonic hyponatremia. Bruised right eye -racoon type. CTH neg. ? Aspiration on Unasyn. Mild starvation ketosis.  On hypertonic saline.    Data: Reviewed Sodium at 107.  Tox screen neg.  CT scans report reviewed  Camera: Black, racoon eye from fall. Awake , calm. HR 93, 96% sats on room air, MAP 83 Has a steri strip to above eye brow, right side. Getting saline.    eICU Interventions  -goal for sodium not to exceed 113 for today 8 PM. - watch for seizures. Aspiration precaution. High risk  - keep K/mag > 4/2 respectively. -ciwa protocol, MVI/thiamine  - on unasyn.      Intervention Category Major Interventions: Electrolyte abnormality - evaluation and management Evaluation Type: New Patient Evaluation  [name] 09/06/2023, 12:52 AM

## 2023-09-06 NOTE — Progress Notes (Signed)
 Initial Nutrition Assessment  DOCUMENTATION CODES:   Non-severe (moderate) malnutrition in context of social or environmental circumstances  INTERVENTION:   RD will add supplements with diet advancement  Recommend NGT placement and nutrition support if patient is unable to be placed on a diet within the next 24-48hrs.   Pt at high refeed risk; recommend monitor potassium, magnesium  and phosphorus labs daily until stable  MVI, folic acid  and thiamine  daily   Vitamin C 500mg  BID with diet advancement   Daily weights   If NGT placed, recommend:  Osmolite 1.5@65ml /hr- Initiate at 66ml/hr and increase by 10ml/hr q 8 hours until goal rate is reached.   ProSource TF 20- Give 60ml daily via tube, each supplement provides 80kcal and 20g of protein.   Free water flushes 30ml q4 hours to maintain tube patency   Regimen provides 2420kcal/day, 118g/day protein and 1376ml/day of free water.   NUTRITION DIAGNOSIS:   Moderate Malnutrition related to social / environmental circumstances as evidenced by moderate fat depletion, moderate muscle depletion.  GOAL:   Patient will meet greater than or equal to 90% of their needs  MONITOR:   Diet advancement, Labs, Weight trends, I & O's, Skin  REASON FOR ASSESSMENT:   Consult Assessment of nutrition requirement/status  ASSESSMENT:   68 y/o male with h/o etoh abuse, tobacco use, hypertension, chronic hyponatremia and recurrent falls who is admitted with falls, rhabdomyolsis, new Afib, hyponatremia, dehydration and alcoholic hepatitis.  Met with pt in room today. Pt reports that he is feeling sore. Pt reports poor appetite and oral intake pta. RD suspects pt with poor oral intake at baseline secondary to etoh abuse. Pt failed BSE today and remains NPO. RD will add supplements with diet advancement. Recommend NGT placement and nutrition support if patient is unable to be placed on a diet within the next 24-48hrs. Pt is at high refeed risk. Pt  is receiving high dose IV thiamine . There is no recent weight history in chart to confirm if any significant changes.   Medications reviewed and include: lovenox, folic acid , MVI, thiamine , ceftriaxone , doxycycline, Kphos  Labs reviewed: Na 113(H), K 3.3(L), BUN 7(L), creat 0.53(L), P 2.0(L), Mg 1.9 wnl Wbc- 13.3(H) Cbgs- 75, 75, 78 x 24 hrs  Urine osm- 635, urine sodium 11- 6/9   NUTRITION - FOCUSED PHYSICAL EXAM:  Flowsheet Row Most Recent Value  Orbital Region Mild depletion  Upper Arm Region Moderate depletion  Thoracic and Lumbar Region Mild depletion  Buccal Region Mild depletion  Temple Region Moderate depletion  Clavicle Bone Region Moderate depletion  Clavicle and Acromion Bone Region Moderate depletion  Scapular Bone Region Moderate depletion  Dorsal Hand Moderate depletion  Patellar Region Severe depletion  Anterior Thigh Region Severe depletion  Posterior Calf Region Severe depletion  Edema (RD Assessment) None  Hair Reviewed  Eyes Reviewed  Mouth Reviewed  Skin Reviewed  Nails Reviewed   Diet Order:   Diet Order     None      EDUCATION NEEDS:   Education needs have been addressed  Skin:  Skin Assessment: Reviewed RN Assessment (right eye bruise, right orbital frontal hematoma, right arm and thigh bruising)  Last BM:  6/9  Height:   Ht Readings from Last 1 Encounters:  09/05/23 6' (1.829 m)    Weight:   Wt Readings from Last 1 Encounters:  09/06/23 79.8 kg    Ideal Body Weight:  80.9 kg  BMI:  Body mass index is 23.86 kg/m.  Estimated Nutritional Needs:  Kcal:  2100-2400kcal/day  Protein:  105-120g/day  Fluid:  2.1-2.4L/day  Torrance Freestone MS, RD, LDN If unable to be reached, please send secure chat to "RD inpatient" available from 8:00a-4:00p daily

## 2023-09-06 NOTE — Consult Note (Signed)
 [facility]  CONSULT NOTE    Date: 09/06/2023                  Patient Name:  Barry Fowler  MRN: [id number]  DOB: 08/13/1955  Age / Sex: 68 y.o., male         PCP: Patient, No Pcp Per                 Service Requesting Consult: Critical care team                 Reason for Consult: Hyponatremia            History of Present Illness: Barry Fowler is a 68 y.o.  male with past medical conditions including hypertension, alcohol and tobacco abuse, and hyponatremia, who was admitted to [facility] on 09/05/2023 for Hyponatremia [E87.1] Acute metabolic encephalopathy [G93.41]  Patient presents to the emergency department after being found down by his sister, unknown timeframe.  Patient is seen and evaluated at bedside in intensive care unit.  No family present.  Patient is arousable for short periods.  States he has little recollection of fall.  Patient confirms he does live alone and has a sister that checks on him often.  Does confirm that he drinks beer daily.  According to H&P, sister states patient has been binge drinking and confirms she checks on him frequently throughout the week.  Labs on ED arrival concerning for sodium 104, serum bicarb 17 anion gap 17, CK 3800 hemoglobin 12.1, and UA appears clear.  UDS negative.  Chest x-ray shows a new left retrocardiac opacity.  Patient was started on normal saline and has corrected to 110 at time of rounds.  Medications: Outpatient medications: Medications Prior to Admission  Medication Sig Dispense Refill Last Dose/Taking   losartan (COZAAR) 25 MG tablet Take 25 mg by mouth daily.   Unknown   pravastatin (PRAVACHOL) 20 MG tablet Take 20 mg by mouth daily.   Unknown   folic acid  (FOLVITE ) 1 MG tablet Take 1 tablet (1 mg total) by mouth daily.      Multiple Vitamin (MULTIVITAMIN WITH MINERALS) TABS tablet Take 1 tablet by mouth daily. 30 tablet 0    pantoprazole  (PROTONIX ) 40 MG tablet Take 1 tablet (40 mg total) by mouth 2  (two) times daily. 90 tablet 0    polyethylene glycol (MIRALAX  / GLYCOLAX ) 17 g packet Take 17 g by mouth daily as needed for mild constipation. 14 each 0    thiamine  100 MG tablet Take 1 tablet (100 mg total) by mouth daily. 30 tablet 0     Current medications: Current Facility-Administered Medications  Medication Dose Route Frequency Provider Last Rate Last Admin   cefTRIAXone  (ROCEPHIN ) 1 g in sodium chloride  0.9 % 100 mL IVPB  1 g Intravenous Q24H Assaker, Jean-Pierre, MD 200 mL/hr at 09/06/23 1106 1 g at 09/06/23 1106   chlorpheniramine-HYDROcodone (TUSSIONEX) 10-8 MG/5ML suspension 5 mL  5 mL Oral Q12H PRN Gideon Kussmaul, NP       docusate sodium (COLACE) capsule 100 mg  100 mg Oral BID PRN Gideon Kussmaul, NP       doxycycline (VIBRAMYCIN) 100 mg in sodium chloride  0.9 % 250 mL IVPB  100 mg Intravenous Q12H Assaker, Marianne Shirts, MD 125 mL/hr at 09/06/23 1148 100 mg at 09/06/23 1148   enoxaparin (LOVENOX) injection 40 mg  40 mg Subcutaneous Q24H Annitta Kindler, MD   40 mg at 09/06/23 1238  folic acid  (FOLVITE ) tablet 1 mg  1 mg Oral Daily Gideon Kussmaul, NP   1 mg at 09/05/23 2154   LORazepam  (ATIVAN ) injection 1-2 mg  1-2 mg Intravenous Q1H PRN Gideon Kussmaul, NP       multivitamin with minerals tablet 1 tablet  1 tablet Oral Daily Gideon Kussmaul, NP   1 tablet at 09/05/23 2154   Oral care mouth rinse  15 mL Mouth Rinse PRN Assaker, Marianne Shirts, MD       PHENObarbital (LUMINAL) injection 97.5 mg  97.5 mg Intravenous Q8H Assaker, Marianne Shirts, MD   97.5 mg at 09/06/23 1103   Followed by   Cecily Cohen ON 09/08/2023] PHENObarbital (LUMINAL) injection 65 mg  65 mg Intravenous Q8H Assaker, Marianne Shirts, MD       Followed by   Cecily Cohen ON 09/10/2023] PHENObarbital (LUMINAL) injection 32.5 mg  32.5 mg Intravenous Q8H Assaker, Jean-Pierre, MD       polyethylene glycol (MIRALAX  / GLYCOLAX ) packet 17 g  17 g Oral Daily PRN Gideon Kussmaul, NP        potassium PHOSPHATE 30 mmol in dextrose  5 % 500 mL infusion  30 mmol Intravenous Once Coretta Dexter, RPH 85 mL/hr at 09/06/23 1003 30 mmol at 09/06/23 1003   [START ON 09/07/2023] thiamine  (VITAMIN B1) 500 mg in sodium chloride  0.9 % 50 mL IVPB  500 mg Intravenous Daily Assaker, Marianne Shirts, MD       Followed by   Cecily Cohen ON 09/10/2023] thiamine  (VITAMIN B1) injection 100 mg  100 mg Intravenous Daily Assaker, Marianne Shirts, MD          Allergies: No Known Allergies    Past Medical History: Past Medical History:  Diagnosis Date   Hypertension      Past Surgical History: Past Surgical History:  Procedure Laterality Date   EYE SURGERY       Family History: History reviewed. No pertinent family history.   Social History: Social History   Socioeconomic History   Marital status: Single    Spouse name: Not on file   Number of children: Not on file   Years of education: Not on file   Highest education level: Not on file  Occupational History   Not on file  Tobacco Use   Smoking status: Every Day    Current packs/day: 1.00    Average packs/day: 1 pack/day for 42.0 years (42.0 ttl pk-yrs)    Types: Cigarettes   Smokeless tobacco: Never  Substance and Sexual Activity   Alcohol use: Yes    Comment: beer daily   Drug use: Not on file   Sexual activity: Not on file  Other Topics Concern   Not on file  Social History Narrative   Not on file   Social Drivers of Health   Financial Resource Strain: Not on file  Food Insecurity: No Food Insecurity (09/06/2023)   Hunger Vital Sign    Worried About Running Out of Food in the Last Year: Never true    Ran Out of Food in the Last Year: Never true  Transportation Needs: No Transportation Needs (09/06/2023)   PRAPARE - Administrator, Civil Service (Medical): No    Lack of Transportation (Non-Medical): No  Physical Activity: Not on file  Stress: Not on file  Social Connections: Unknown (09/06/2023)   Social Connection  and Isolation Panel [NHANES]    Frequency of Communication with Friends and Family: Never    Frequency of Social Gatherings with Friends and  Family: Never    Attends Religious Services: Never    Active Member of Clubs or Organizations: Yes    Attends Banker Meetings: Never    Marital Status: Patient unable to answer  Intimate Partner Violence: Not At Risk (09/06/2023)   Humiliation, Afraid, Rape, and Kick questionnaire    Fear of Current or Ex-Partner: No    Emotionally Abused: No    Physically Abused: No    Sexually Abused: No     Review of Systems: Review of Systems  Constitutional:  Negative for chills, fever and malaise/fatigue.  HENT:  Negative for congestion, sore throat and tinnitus.   Eyes:  Negative for blurred vision and redness.  Respiratory:  Negative for cough, shortness of breath and wheezing.   Cardiovascular:  Negative for chest pain, palpitations, claudication and leg swelling.  Gastrointestinal:  Negative for abdominal pain, blood in stool, diarrhea, nausea and vomiting.  Genitourinary:  Negative for flank pain, frequency and hematuria.  Musculoskeletal:  Positive for falls. Negative for back pain and myalgias.  Skin:  Negative for rash.  Neurological:  Negative for dizziness, weakness and headaches.  Endo/Heme/Allergies:  Does not bruise/bleed easily.  Psychiatric/Behavioral:  Negative for depression. The patient is not nervous/anxious and does not have insomnia.     Vital Signs: Blood pressure 101/62, pulse 73, temperature 98.2 F (36.8 C), temperature source Oral, resp. rate 19, height 6' (1.829 m), weight 79.8 kg, SpO2 94%.  Weight trends: Filed Weights   09/05/23 1819 09/06/23 0000 09/06/23 0327  Weight: 67 kg 79.8 kg 79.8 kg    Physical Exam: General: Ill-appearing  Head: Normocephalic, atraumatic. Moist oral mucosal membranes  Eyes: Periorbital hematoma  Neck: Supple  Lungs:  Clear to auscultation, normal effort  Heart: Regular  rate and rhythm  Abdomen:  Soft, nontender,   Extremities: No peripheral edema.  Neurologic: Arousable for short periods, moving all four extremities  Skin: Generalized bruising        Lab results: Basic Metabolic Panel: Recent Labs  Lab 09/05/23 1755 09/05/23 2025 09/06/23 0112 09/06/23 0321 09/06/23 0705 09/06/23 0851 09/06/23 1056  NA 104* 107* 109* 110* 110* 112* 113*  K 3.7  --  3.4* 3.3*  --   --   --   CL 70*  --  74* 76*  --   --   --   CO2 17*  --  19* 20*  --   --   --   GLUCOSE 78  --  75 75  --   --   --   BUN 9  --  6* 7*  --   --   --   CREATININE 0.51*  --  0.62 0.53*  --   --   --   CALCIUM 7.5*  --  7.8* 7.5*  --   --   --   MG 1.8 1.8  --  1.9  --   --   --   PHOS  --   --   --  2.0*  --   --   --     Liver Function Tests: Recent Labs  Lab 09/05/23 1755  AST 123*  ALT 42  ALKPHOS 48  BILITOT 1.9*  PROT 6.0*  ALBUMIN 2.8*   No results for input(s): "LIPASE", "AMYLASE" in the last 168 hours. No results for input(s): "AMMONIA" in the last 168 hours.  CBC: Recent Labs  Lab 09/05/23 1755 09/06/23 0321  WBC 13.9* 13.3*  NEUTROABS 10.5*  --  HGB 12.1* 11.6*  HCT RESULTS UNAVAILABLE DUE TO INTERFERING SUBSTANCE RESULTS UNAVAILABLE DUE TO INTERFERING SUBSTANCE  MCV RESULTS UNAVAILABLE DUE TO INTERFERING SUBSTANCE RESULTS UNAVAILABLE DUE TO INTERFERING SUBSTANCE  PLT 290 313    Cardiac Enzymes: Recent Labs  Lab 09/05/23 1755  CKTOTAL 3,815*    BNP: Invalid input(s): "POCBNP"  CBG: Recent Labs  Lab 09/06/23 0023  GLUCAP 71    Microbiology: Results for orders placed or performed during the hospital encounter of 09/05/23  MRSA Next Gen by PCR, Nasal     Status: None   Collection Time: 09/05/23  8:25 PM   Specimen: Nasal Mucosa; Nasal Swab  Result Value Ref Range Status   MRSA by PCR Next Gen NOT DETECTED NOT DETECTED Final    Comment: (NOTE) The GeneXpert MRSA Assay (FDA approved for NASAL specimens only), is one component of  a comprehensive MRSA colonization surveillance program. It is not intended to diagnose MRSA infection nor to guide or monitor treatment for MRSA infections. Test performance is not FDA approved in patients less than 51 years old. Performed at [facility], [address] [place], [place] [address]   Culture, blood (Routine X 2) w Reflex to ID Panel     Status: None (Preliminary result)   Collection Time: 09/06/23  1:12 AM   Specimen: BLOOD  Result Value Ref Range Status   Specimen Description BLOOD BLOOD LEFT ARM  Final   Special Requests   Final    BOTTLES DRAWN AEROBIC AND ANAEROBIC Blood Culture adequate volume   Culture   Final    NO GROWTH < 12 HOURS Performed at [facility], [address] [place], [place] [address]    Report Status PENDING  Incomplete    Coagulation Studies: No results for input(s): "LABPROT", "INR" in the last 72 hours.  Urinalysis: Recent Labs    09/05/23 1905  COLORURINE YELLOW*  LABSPEC 1.020  PHURINE 6.0  GLUCOSEU 150*  HGBUR SMALL*  BILIRUBINUR NEGATIVE  KETONESUR 80*  PROTEINUR 30*  NITRITE NEGATIVE  LEUKOCYTESUR NEGATIVE      Imaging: CT HEAD WO CONTRAST ( ) Result Date: 09/05/2023 CLINICAL DATA:  Fall, head injury EXAM: CT HEAD WITHOUT CONTRAST CT MAXILLOFACIAL WITHOUT CONTRAST CT CERVICAL SPINE WITHOUT CONTRAST TECHNIQUE: Multidetector CT imaging of the head, cervical spine, and maxillofacial structures were performed using the standard protocol without intravenous contrast. Multiplanar CT image reconstructions of the cervical spine and maxillofacial structures were also generated. RADIATION DOSE REDUCTION: This exam was performed according to the departmental dose-optimization program which includes automated exposure control, adjustment of the mA and/or kV according to patient size and/or use of iterative reconstruction technique. COMPARISON:  12/03/2020 FINDINGS: CT HEAD FINDINGS Brain: No evidence of acute  infarction, hemorrhage, hydrocephalus, extra-axial collection or mass lesion/mass effect. Periventricular white matter hypodensity. Vascular: No hyperdense vessel or unexpected calcification. CT FACIAL BONES FINDINGS Skull: Normal. Negative for fracture or focal lesion. Facial bones: No displaced fractures or dislocations. Sinuses/Orbits: No acute finding. Other: Multiple soft tissue contusions, including of the right forehead, overlying the right orbit, and right cheek, as well as the scalp vertex and right parietal scalp (series 2, image 18). Patient is edentulous. CT CERVICAL SPINE FINDINGS Alignment: Straightening of the normal cervical lordosis. Skull base and vertebrae: No acute fracture. No primary bone lesion or focal pathologic process. Soft tissues and spinal canal: No prevertebral fluid or swelling. No visible canal hematoma. Disc levels: Moderate multilevel cervical disc degenerative disease. Upper chest: Negative. Other: None. IMPRESSION:  1. No acute intracranial pathology. Small-vessel white matter disease. 2. No displaced fractures or dislocations of the facial bones. 3. Multiple soft tissue contusions, including of the right forehead, overlying the right orbit, and right cheek, as well as the scalp vertex and right parietal scalp. 4. No fracture or static subluxation of the cervical spine. 5. Moderate multilevel cervical disc degenerative disease. Electronically Signed   By: Fredricka Jenny M.D.   On: 09/05/2023 19:54   CT Maxillofacial Wo Contrast Result Date: 09/05/2023 CLINICAL DATA:  Fall, head injury EXAM: CT HEAD WITHOUT CONTRAST CT MAXILLOFACIAL WITHOUT CONTRAST CT CERVICAL SPINE WITHOUT CONTRAST TECHNIQUE: Multidetector CT imaging of the head, cervical spine, and maxillofacial structures were performed using the standard protocol without intravenous contrast. Multiplanar CT image reconstructions of the cervical spine and maxillofacial structures were also generated. RADIATION DOSE REDUCTION:  This exam was performed according to the departmental dose-optimization program which includes automated exposure control, adjustment of the mA and/or kV according to patient size and/or use of iterative reconstruction technique. COMPARISON:  12/03/2020 FINDINGS: CT HEAD FINDINGS Brain: No evidence of acute infarction, hemorrhage, hydrocephalus, extra-axial collection or mass lesion/mass effect. Periventricular white matter hypodensity. Vascular: No hyperdense vessel or unexpected calcification. CT FACIAL BONES FINDINGS Skull: Normal. Negative for fracture or focal lesion. Facial bones: No displaced fractures or dislocations. Sinuses/Orbits: No acute finding. Other: Multiple soft tissue contusions, including of the right forehead, overlying the right orbit, and right cheek, as well as the scalp vertex and right parietal scalp (series 2, image 18). Patient is edentulous. CT CERVICAL SPINE FINDINGS Alignment: Straightening of the normal cervical lordosis. Skull base and vertebrae: No acute fracture. No primary bone lesion or focal pathologic process. Soft tissues and spinal canal: No prevertebral fluid or swelling. No visible canal hematoma. Disc levels: Moderate multilevel cervical disc degenerative disease. Upper chest: Negative. Other: None. IMPRESSION: 1. No acute intracranial pathology. Small-vessel white matter disease. 2. No displaced fractures or dislocations of the facial bones. 3. Multiple soft tissue contusions, including of the right forehead, overlying the right orbit, and right cheek, as well as the scalp vertex and right parietal scalp. 4. No fracture or static subluxation of the cervical spine. 5. Moderate multilevel cervical disc degenerative disease. Electronically Signed   By: Fredricka Jenny M.D.   On: 09/05/2023 19:54   CT Cervical Spine Wo Contrast Result Date: 09/05/2023 CLINICAL DATA:  Fall, head injury EXAM: CT HEAD WITHOUT CONTRAST CT MAXILLOFACIAL WITHOUT CONTRAST CT CERVICAL SPINE WITHOUT  CONTRAST TECHNIQUE: Multidetector CT imaging of the head, cervical spine, and maxillofacial structures were performed using the standard protocol without intravenous contrast. Multiplanar CT image reconstructions of the cervical spine and maxillofacial structures were also generated. RADIATION DOSE REDUCTION: This exam was performed according to the departmental dose-optimization program which includes automated exposure control, adjustment of the mA and/or kV according to patient size and/or use of iterative reconstruction technique. COMPARISON:  12/03/2020 FINDINGS: CT HEAD FINDINGS Brain: No evidence of acute infarction, hemorrhage, hydrocephalus, extra-axial collection or mass lesion/mass effect. Periventricular white matter hypodensity. Vascular: No hyperdense vessel or unexpected calcification. CT FACIAL BONES FINDINGS Skull: Normal. Negative for fracture or focal lesion. Facial bones: No displaced fractures or dislocations. Sinuses/Orbits: No acute finding. Other: Multiple soft tissue contusions, including of the right forehead, overlying the right orbit, and right cheek, as well as the scalp vertex and right parietal scalp (series 2, image 18). Patient is edentulous. CT CERVICAL SPINE FINDINGS Alignment: Straightening of the normal cervical lordosis. Skull base and vertebrae:  No acute fracture. No primary bone lesion or focal pathologic process. Soft tissues and spinal canal: No prevertebral fluid or swelling. No visible canal hematoma. Disc levels: Moderate multilevel cervical disc degenerative disease. Upper chest: Negative. Other: None. IMPRESSION: 1. No acute intracranial pathology. Small-vessel white matter disease. 2. No displaced fractures or dislocations of the facial bones. 3. Multiple soft tissue contusions, including of the right forehead, overlying the right orbit, and right cheek, as well as the scalp vertex and right parietal scalp. 4. No fracture or static subluxation of the cervical spine. 5.  Moderate multilevel cervical disc degenerative disease. Electronically Signed   By: Fredricka Jenny M.D.   On: 09/05/2023 19:54   DG Chest Portable 1 View Result Date: 09/05/2023 CLINICAL DATA:  fall EXAM: PORTABLE CHEST - 1 VIEW COMPARISON:  12/03/2020 FINDINGS: New left retrocardiac opacity.  Right lung clear. Heart size and mediastinal contours are within normal limits. Aortic Atherosclerosis (ICD10-170.0). No effusion. Old right lateral rib fracture deformities. 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 IMPRESSION: New left retrocardiac opacity. Electronically Signed   By: Nicoletta Barrier M.D.   On: 09/05/2023 19:53   DG Elbow 2 Views Right Result Date: 09/05/2023 CLINICAL DATA:  Recent fall with right elbow pain, initial encounter EXAM: RIGHT ELBOW - 2 VIEW COMPARISON:  None Available. FINDINGS: There is no evidence of fracture, dislocation, or joint effusion. There is no evidence of arthropathy or other focal bone abnormality. Soft tissues are unremarkable. IMPRESSION: No acute abnormality noted. Electronically Signed   By: Violeta Grey M.D.   On: 09/05/2023 19:47     Assessment & Plan: Barry Fowler is a 68 y.o.  male with past medical conditions including hypertension, alcohol and tobacco abuse, and hyponatremia, who was admitted to [facility] on 09/05/2023 for Hyponatremia [E87.1] Acute metabolic encephalopathy [G93.41]   1.  Severe hyponatremia, patient has history of chronic hyponatremia.  Sodium on ED arrival 104.  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  Currently correcting on normal saline, continue at this time.  If patient results stall, can consider changing to 3% hypertonic saline.    LOS: 1 Barry Fowler 6/10/20251:39 PM

## 2023-09-06 NOTE — ED Notes (Signed)
 Pt had incontinent episode, cleansed and placed in gown

## 2023-09-07 DIAGNOSIS — E440 Moderate protein-calorie malnutrition: Secondary | ICD-10-CM | POA: Insufficient documentation

## 2023-09-07 LAB — SODIUM
Sodium: 115 mmol/L — CL (ref 135–145)
Sodium: 114 mmol/L — CL (ref 135–145)
Sodium: 116 mmol/L — CL (ref 135–145)
Sodium: 118 mmol/L — CL (ref 135–145)
Sodium: 118 mmol/L — CL (ref 135–145)
Sodium: 119 mmol/L — CL (ref 135–145)

## 2023-09-07 LAB — GLUCOSE, CAPILLARY
Glucose-Capillary: 101 mg/dL — ABNORMAL HIGH (ref 70–99)
Glucose-Capillary: 93 mg/dL (ref 70–99)
Glucose-Capillary: 98 mg/dL (ref 70–99)
Glucose-Capillary: 109 mg/dL — ABNORMAL HIGH (ref 70–99)
Glucose-Capillary: 119 mg/dL — ABNORMAL HIGH (ref 70–99)
Glucose-Capillary: 98 mg/dL (ref 70–99)

## 2023-09-07 LAB — CBC
HCT: 27.7 % — ABNORMAL LOW (ref 39.0–52.0)
Hemoglobin: 10.3 g/dL — ABNORMAL LOW (ref 13.0–17.0)
MCH: 30.7 pg (ref 26.0–34.0)
MCHC: 37.2 g/dL — ABNORMAL HIGH (ref 30.0–36.0)
MCV: 82.4 fL (ref 80.0–100.0)
Platelets: 291 10*3/uL (ref 150–400)
RBC: 3.36 MIL/uL — ABNORMAL LOW (ref 4.22–5.81)
RDW: 13.2 % (ref 11.5–15.5)
WBC: 10.3 10*3/uL (ref 4.0–10.5)
nRBC: 0 % (ref 0.0–0.2)

## 2023-09-07 LAB — MAGNESIUM: Magnesium: 1.9 mg/dL (ref 1.7–2.4)

## 2023-09-07 LAB — BASIC METABOLIC PANEL WITH GFR
Anion gap: 8 (ref 5–15)
BUN: <5 mg/dL — ABNORMAL LOW (ref 8–23)
CO2: 25 mmol/L (ref 22–32)
Calcium: 7.5 mg/dL — ABNORMAL LOW (ref 8.9–10.3)
Chloride: 82 mmol/L — ABNORMAL LOW (ref 98–111)
Creatinine, Ser: 0.5 mg/dL — ABNORMAL LOW (ref 0.61–1.24)
GFR, Estimated: >60 mL/min (ref >60)
Glucose, Bld: 90 mg/dL (ref 70–99)
Potassium: 3.3 mmol/L — ABNORMAL LOW (ref 3.5–5.1)
Sodium: 115 mmol/L — CL (ref 135–145)

## 2023-09-07 LAB — OSMOLALITY, URINE: Osmolality, Ur: 560 mosm/kg (ref 300–900)

## 2023-09-07 LAB — SODIUM, URINE, RANDOM: Sodium, Ur: 19 mmol/L

## 2023-09-07 LAB — OSMOLALITY: Osmolality: 239 mosm/kg — CL (ref 275–295)

## 2023-09-07 LAB — PHOSPHORUS: Phosphorus: 2 mg/dL — ABNORMAL LOW (ref 2.5–4.6)

## 2023-09-07 MED ORDER — POTASSIUM PHOSPHATES 15 MMOLE/5ML IV SOLN
15.0000 mmol | Freq: Once | INTRAVENOUS | Status: DC
  Filled 2023-09-07: qty 5

## 2023-09-07 MED ORDER — POTASSIUM CHLORIDE 10 MEQ/100ML IV SOLN
10.0000 meq | INTRAVENOUS | Status: AC
  Administered 2023-09-07 (×3): 10 meq via INTRAVENOUS
  Filled 2023-09-07 (×4): qty 100

## 2023-09-07 MED ORDER — SODIUM CHLORIDE 0.9 % IV SOLN: INTRAVENOUS | Status: DC

## 2023-09-07 MED ORDER — POTASSIUM PHOSPHATES 15 MMOLE/5ML IV SOLN
30.0000 mmol | Freq: Once | INTRAVENOUS | Status: AC
  Administered 2023-09-07: 30 mmol via INTRAVENOUS
  Filled 2023-09-07: qty 10

## 2023-09-07 MED ORDER — SODIUM CHLORIDE 3 % IV SOLN
INTRAVENOUS | Status: DC
  Administered 2023-09-09: 30 mL/h via INTRAVENOUS
  Filled 2023-09-07 (×6): qty 500

## 2023-09-07 NOTE — Progress Notes (Signed)
 NAME:  Barry Fowler, MRN:  [id number], DOB:  [birth year], LOS: 2 ADMISSION DATE:  09/05/2023  History of Present Illness:  68 y.o male with significant PMH of EtOH abuse, tobacco abuse, hypertension, chronic hyponatremia and recurrent falls who presented to the ED with chief complaints of fall.   Per ED reports, patient was found down by sister who called EMS.  It is unclear how long he has been down but reported he fell on Thursday and sustained multiple bruises.  Patient sister reported he has been binge drinking and she normally checks on him a few times per week.   ED Course: Initial vital signs showed HR of 104 beats/minute, BP 128/78 mm Hg, the RR 18 breaths/minute, and the oxygen saturation 96% on RA and a temperature of 98.63F (36.7C).  Pertinent Labs/Diagnostics Findings: Na+/ K+: 104/3.7.  CO2 17.  Anion gap 17 glucose: 78 Calcium: 7.5 AST/ALT: 123/42 Serum osmolality 221 CK 3850 WBC: 13.9 CXR> CTH> CT Maxillary>CT cervical> negative Medication administered in the ED: 3% hypertonic solution 100 mL, 1L LR bolus and Unasyn Disposition:ICU  Significant Hospital Events: Including procedures, antibiotic start and stop dates in addition to other pertinent events   09/05/2023 - Admitted to the ICU with severe hyponatremia and started on gentle hydration with NS.  09/06/2023 - Started on phenobarb taper. Sodium correcting adequately  Interim History / Subjective:  Remains lethargic this am. Na correcting adequately. Received 1 dose ddavp yesterday for quick correction.   Objective    Blood pressure (!) 89/54, pulse 64, temperature 97.6 F (36.4 C), resp. rate 15, height 6' (1.829 m), weight 83.5 kg, SpO2 95%.        Intake/Output Summary (Last 24 hours) at 09/07/2023 0915 Last data filed at 09/07/2023 0400 Gross per 24 hour  Intake 1216.04 ml  Output 1100 ml  Net 116.04 ml   Filed Weights   09/06/23 0000 09/06/23 0327 09/07/23 0500  Weight: 79.8 kg 79.8 kg 83.5 kg     Examination: General: Lethargic and unable to answer questions HENT: Supple neck, reactive pupils  Lungs: Clear bilateral air entry Cardiovascular: Normal S1, Normal S2, RRR Abdomen: Soft, non tender, non distended, + BS Extremities: Warm and well perfused  Labs and imaging were reviewed.   Assessment and Plan  Barry Fowler is a 68 year old male patient with a past medical history of alcohol abuse, tobacco abuse, hypertension, chronic hyponatremia with sodium between 125 and 130 presenting to the ED with chief complaint of fall.  He was found to have severe hyponatremia with sodium of 104.  Thought to be secondary to decreased salt intake, dehydration and binge drinking with possible beer potomania.   #Severe hyposmolar hyponatremia with increased urine osm and decreased urine sodium consistent with hypovolemia, some component of decreased salt intake.  #Alcohol use disorder with high risk for withdrawals #CAP vs Aspiration pneumonia  #Hypertension and Hyperlipidemia  #Malnutrition   Neuro: CIWA protocol. Phenobarb taper.  Thiamine  500 IV for 3 doses then 100 daily.  Multivitamin.  Replete Phos. CVS: Stable maintain MAP greater than 65. Pulm/ID: Ceftriaxone  doxycycline.  MRSA swab.  SpO2 goal greater than 90%. GI: Replating Phos.  Keep n.p.o. for now.  Lax as needed. Renal: Ok Q4 sodium checks.  Goal is 120 to 122 by 18:00 Aggressively repleting phosph. Appreciate nephrology consult. Endo: POC 140-180. Heme: Lovenox for dvt prophylaxis.   Best Practice (right click and Reselect all SmartList Selections daily)   Diet/type: NPO DVT prophylaxis LMWH Pressure ulcer(s): N/A GI prophylaxis:  N/A Lines: N/A Foley:  N/A Code Status:  full code  Last date of multidisciplinary goals of care discussion [09/07/2023]  Critical care time: 40 minutes    Annitta Kindler, MD [facility] 09/07/2023 9:15 AM

## 2023-09-07 NOTE — Progress Notes (Addendum)
 [facility]  ROUNDING NOTE   Subjective:   Patient resting quietly in bed Alert Remains on room air Complains of fatigue  Sodium remains 114  Objective:  Vital signs in last 24 hours:  Temp:  [97.6 F (36.4 C)-98.6 F (37 C)] 97.6 F (36.4 C) (06/11 0800) Pulse Rate:  [62-75] 64 (06/11 0700) Resp:  [14-23] 15 (06/11 0700) BP: (82-104)/(53-70) 89/54 (06/11 0700) SpO2:  [92 %-99 %] 95 % (06/11 0700) Weight:  [83.5 kg] 83.5 kg (06/11 0500)  Weight change: 16.5 kg Filed Weights   09/06/23 0000 09/06/23 0327 09/07/23 0500  Weight: 79.8 kg 79.8 kg 83.5 kg    Intake/Output: I/O last 3 completed shifts: In: 1733.9 [I.V.:472; IV Piggyback:1261.9] Out: 1750 [Urine:1750]   Intake/Output this shift:  Total I/O In: 320.3 [I.V.:181.1; IV Piggyback:139.2] Out: -   Physical Exam: General: NAD  Head: Normocephalic, atraumatic. Moist oral mucosal membranes  Eyes: Periorbital hematoma    Neck: Supple  Lungs:  Clear to auscultation  Heart: Regular rate and rhythm  Abdomen:  Soft, nontender  Extremities:  No peripheral edema.  Neurologic: Awake, alert, conversant  Skin: Warm,dry, Generalized bruising        Basic Metabolic Panel: Recent Labs  Lab 09/05/23 1755 09/05/23 2025 09/06/23 0112 09/06/23 0321 09/06/23 0705 09/06/23 2048 09/06/23 2305 09/07/23 0109 09/07/23 0337 09/07/23 0809  NA 104* 107* 109* 110*   < > 109* 114* 115* 115* 114*  K 3.7  --  3.4* 3.3*  --   --   --   --  3.3*  --   CL 70*  --  74* 76*  --   --   --   --  82*  --   CO2 17*  --  19* 20*  --   --   --   --  25  --   GLUCOSE 78  --  75 75  --   --   --   --  90  --   BUN 9  --  6* 7*  --   --   --   --  <5*  --   CREATININE 0.51*  --  0.62 0.53*  --   --   --   --  0.50*  --   CALCIUM 7.5*  --  7.8* 7.5*  --   --   --   --  7.5*  --   MG 1.8 1.8  --  1.9  --   --   --   --  1.9  --   PHOS  --   --   --  2.0*  --   --   --   --  2.0*  --    < > = values in this interval not  displayed.    Liver Function Tests: Recent Labs  Lab 09/05/23 1755  AST 123*  ALT 42  ALKPHOS 48  BILITOT 1.9*  PROT 6.0*  ALBUMIN 2.8*   No results for input(s): LIPASE, AMYLASE in the last 168 hours. No results for input(s): AMMONIA in the last 168 hours.  CBC: Recent Labs  Lab 09/05/23 1755 09/06/23 0321 09/07/23 0337  WBC 13.9* 13.3* 10.3  NEUTROABS 10.5*  --   --   HGB 12.1* 11.6* 10.3*  HCT RESULTS UNAVAILABLE DUE TO INTERFERING SUBSTANCE RESULTS UNAVAILABLE DUE TO INTERFERING SUBSTANCE 27.7*  MCV RESULTS UNAVAILABLE DUE TO INTERFERING SUBSTANCE RESULTS UNAVAILABLE DUE TO INTERFERING SUBSTANCE 82.4  PLT 290 313 291  Cardiac Enzymes: Recent Labs  Lab 09/05/23 1755  CKTOTAL 3,815*    BNP: Invalid input(s): POCBNP  CBG: Recent Labs  Lab 09/06/23 0023 09/06/23 2035 09/06/23 2344 09/07/23 0402 09/07/23 0745  GLUCAP 71 108* 98 101* 93    Microbiology: Results for orders placed or performed during the hospital encounter of 09/05/23  MRSA Next Gen by PCR, Nasal     Status: None   Collection Time: 09/05/23  8:25 PM   Specimen: Nasal Mucosa; Nasal Swab  Result Value Ref Range Status   MRSA by PCR Next Gen NOT DETECTED NOT DETECTED Final    Comment: (NOTE) The GeneXpert MRSA Assay (FDA approved for NASAL specimens only), is one component of a comprehensive MRSA colonization surveillance program. It is not intended to diagnose MRSA infection nor to guide or monitor treatment for MRSA infections. Test performance is not FDA approved in patients less than 18 years old. Performed at [facility], [address] [place], [place] [address]   Culture, blood (Routine X 2) w Reflex to ID Panel     Status: None (Preliminary result)   Collection Time: 09/06/23  1:12 AM   Specimen: BLOOD  Result Value Ref Range Status   Specimen Description BLOOD BLOOD LEFT ARM  Final   Special Requests   Final    BOTTLES DRAWN AEROBIC AND ANAEROBIC Blood  Culture adequate volume   Culture   Final    NO GROWTH 1 DAY Performed at [facility], [address] [place], [place] [address]    Report Status PENDING  Incomplete  Culture, blood (Routine X 2) w Reflex to ID Panel     Status: None (Preliminary result)   Collection Time: 09/06/23  1:12 AM   Specimen: BLOOD  Result Value Ref Range Status   Specimen Description BLOOD BLOOD LEFT ARM  Final   Special Requests Blood Culture adequate volume  Final   Culture   Final    NO GROWTH 1 DAY Performed at [facility], [address], [place], [place] [address]    Report Status PENDING  Incomplete    Coagulation Studies: No results for input(s): LABPROT, INR in the last 72 hours.  Urinalysis: Recent Labs    09/05/23 1905  COLORURINE YELLOW*  LABSPEC 1.020  PHURINE 6.0  GLUCOSEU 150*  HGBUR SMALL*  BILIRUBINUR NEGATIVE  KETONESUR 80*  PROTEINUR 30*  NITRITE NEGATIVE  LEUKOCYTESUR NEGATIVE      Imaging: CT HEAD WO CONTRAST ( ) Result Date: 09/05/2023 CLINICAL DATA:  Fall, head injury EXAM: CT HEAD WITHOUT CONTRAST CT MAXILLOFACIAL WITHOUT CONTRAST CT CERVICAL SPINE WITHOUT CONTRAST TECHNIQUE: Multidetector CT imaging of the head, cervical spine, and maxillofacial structures were performed using the standard protocol without intravenous contrast. Multiplanar CT image reconstructions of the cervical spine and maxillofacial structures were also generated. RADIATION DOSE REDUCTION: This exam was performed according to the departmental dose-optimization program which includes automated exposure control, adjustment of the mA and/or kV according to patient size and/or use of iterative reconstruction technique. COMPARISON:  12/03/2020 FINDINGS: CT HEAD FINDINGS Brain: No evidence of acute infarction, hemorrhage, hydrocephalus, extra-axial collection or mass lesion/mass effect. Periventricular white matter hypodensity. Vascular: No hyperdense vessel or unexpected  calcification. CT FACIAL BONES FINDINGS Skull: Normal. Negative for fracture or focal lesion. Facial bones: No displaced fractures or dislocations. Sinuses/Orbits: No acute finding. Other: Multiple soft tissue contusions, including of the right forehead, overlying the right orbit, and right cheek, as well as the scalp vertex and right  parietal scalp (series 2, image 18). Patient is edentulous. CT CERVICAL SPINE FINDINGS Alignment: Straightening of the normal cervical lordosis. Skull base and vertebrae: No acute fracture. No primary bone lesion or focal pathologic process. Soft tissues and spinal canal: No prevertebral fluid or swelling. No visible canal hematoma. Disc levels: Moderate multilevel cervical disc degenerative disease. Upper chest: Negative. Other: None. IMPRESSION: 1. No acute intracranial pathology. Small-vessel white matter disease. 2. No displaced fractures or dislocations of the facial bones. 3. Multiple soft tissue contusions, including of the right forehead, overlying the right orbit, and right cheek, as well as the scalp vertex and right parietal scalp. 4. No fracture or static subluxation of the cervical spine. 5. Moderate multilevel cervical disc degenerative disease. Electronically Signed   By: Fredricka Jenny M.D.   On: 09/05/2023 19:54   CT Maxillofacial Wo Contrast Result Date: 09/05/2023 CLINICAL DATA:  Fall, head injury EXAM: CT HEAD WITHOUT CONTRAST CT MAXILLOFACIAL WITHOUT CONTRAST CT CERVICAL SPINE WITHOUT CONTRAST TECHNIQUE: Multidetector CT imaging of the head, cervical spine, and maxillofacial structures were performed using the standard protocol without intravenous contrast. Multiplanar CT image reconstructions of the cervical spine and maxillofacial structures were also generated. RADIATION DOSE REDUCTION: This exam was performed according to the departmental dose-optimization program which includes automated exposure control, adjustment of the mA and/or kV according to patient  size and/or use of iterative reconstruction technique. COMPARISON:  12/03/2020 FINDINGS: CT HEAD FINDINGS Brain: No evidence of acute infarction, hemorrhage, hydrocephalus, extra-axial collection or mass lesion/mass effect. Periventricular white matter hypodensity. Vascular: No hyperdense vessel or unexpected calcification. CT FACIAL BONES FINDINGS Skull: Normal. Negative for fracture or focal lesion. Facial bones: No displaced fractures or dislocations. Sinuses/Orbits: No acute finding. Other: Multiple soft tissue contusions, including of the right forehead, overlying the right orbit, and right cheek, as well as the scalp vertex and right parietal scalp (series 2, image 18). Patient is edentulous. CT CERVICAL SPINE FINDINGS Alignment: Straightening of the normal cervical lordosis. Skull base and vertebrae: No acute fracture. No primary bone lesion or focal pathologic process. Soft tissues and spinal canal: No prevertebral fluid or swelling. No visible canal hematoma. Disc levels: Moderate multilevel cervical disc degenerative disease. Upper chest: Negative. Other: None. IMPRESSION: 1. No acute intracranial pathology. Small-vessel white matter disease. 2. No displaced fractures or dislocations of the facial bones. 3. Multiple soft tissue contusions, including of the right forehead, overlying the right orbit, and right cheek, as well as the scalp vertex and right parietal scalp. 4. No fracture or static subluxation of the cervical spine. 5. Moderate multilevel cervical disc degenerative disease. Electronically Signed   By: Fredricka Jenny M.D.   On: 09/05/2023 19:54   CT Cervical Spine Wo Contrast Result Date: 09/05/2023 CLINICAL DATA:  Fall, head injury EXAM: CT HEAD WITHOUT CONTRAST CT MAXILLOFACIAL WITHOUT CONTRAST CT CERVICAL SPINE WITHOUT CONTRAST TECHNIQUE: Multidetector CT imaging of the head, cervical spine, and maxillofacial structures were performed using the standard protocol without intravenous contrast.  Multiplanar CT image reconstructions of the cervical spine and maxillofacial structures were also generated. RADIATION DOSE REDUCTION: This exam was performed according to the departmental dose-optimization program which includes automated exposure control, adjustment of the mA and/or kV according to patient size and/or use of iterative reconstruction technique. COMPARISON:  12/03/2020 FINDINGS: CT HEAD FINDINGS Brain: No evidence of acute infarction, hemorrhage, hydrocephalus, extra-axial collection or mass lesion/mass effect. Periventricular white matter hypodensity. Vascular: No hyperdense vessel or unexpected calcification. CT FACIAL BONES FINDINGS Skull: Normal. Negative for fracture  or focal lesion. Facial bones: No displaced fractures or dislocations. Sinuses/Orbits: No acute finding. Other: Multiple soft tissue contusions, including of the right forehead, overlying the right orbit, and right cheek, as well as the scalp vertex and right parietal scalp (series 2, image 18). Patient is edentulous. CT CERVICAL SPINE FINDINGS Alignment: Straightening of the normal cervical lordosis. Skull base and vertebrae: No acute fracture. No primary bone lesion or focal pathologic process. Soft tissues and spinal canal: No prevertebral fluid or swelling. No visible canal hematoma. Disc levels: Moderate multilevel cervical disc degenerative disease. Upper chest: Negative. Other: None. IMPRESSION: 1. No acute intracranial pathology. Small-vessel white matter disease. 2. No displaced fractures or dislocations of the facial bones. 3. Multiple soft tissue contusions, including of the right forehead, overlying the right orbit, and right cheek, as well as the scalp vertex and right parietal scalp. 4. No fracture or static subluxation of the cervical spine. 5. Moderate multilevel cervical disc degenerative disease. Electronically Signed   By: Fredricka Jenny M.D.   On: 09/05/2023 19:54   DG Chest Portable 1 View Result Date:  09/05/2023 CLINICAL DATA:  fall EXAM: PORTABLE CHEST - 1 VIEW COMPARISON:  12/03/2020 FINDINGS: New left retrocardiac opacity.  Right lung clear. Heart size and mediastinal contours are within normal limits. Aortic Atherosclerosis (ICD10-170.0). No effusion. Old right lateral rib fracture deformities. 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 IMPRESSION: New left retrocardiac opacity. Electronically Signed   By: Nicoletta Barrier M.D.   On: 09/05/2023 19:53   DG Elbow 2 Views Right Result Date: 09/05/2023 CLINICAL DATA:  Recent fall with right elbow pain, initial encounter EXAM: RIGHT ELBOW - 2 VIEW COMPARISON:  None Available. FINDINGS: There is no evidence of fracture, dislocation, or joint effusion. There is no evidence of arthropathy or other focal bone abnormality. Soft tissues are unremarkable. IMPRESSION: No acute abnormality noted. Electronically Signed   By: Violeta Grey M.D.   On: 09/05/2023 19:47     Medications:    sodium chloride  Stopped (09/07/23 0953)   cefTRIAXone  (ROCEPHIN )  IV Stopped (09/06/23 1136)   doxycycline (VIBRAMYCIN) IV Stopped (09/07/23 0133)   potassium chloride  100 mL/hr at 09/07/23 0954   potassium PHOSPHATE IVPB (in mmol)     thiamine  (VITAMIN B1) injection 110 mL/hr at 09/07/23 0954    Chlorhexidine  Gluconate Cloth  6 each Topical Daily   enoxaparin (LOVENOX) injection  40 mg Subcutaneous Q24H   folic acid   1 mg Oral Daily   multivitamin with minerals  1 tablet Oral Daily   PHENObarbital  97.5 mg Intravenous Q8H   Followed by   Cecily Cohen ON 09/08/2023] PHENObarbital  65 mg Intravenous Q8H   Followed by   Cecily Cohen ON 09/10/2023] PHENObarbital  32.5 mg Intravenous Q8H   [START ON 09/10/2023] thiamine  (VITAMIN B1) injection  100 mg Intravenous Daily   chlorpheniramine-HYDROcodone, docusate sodium, LORazepam , mouth rinse, polyethylene glycol  Assessment/ Plan:  [name] is a 68 y.o.  male with past medical conditions including hypertension, alcohol and  tobacco abuse, and hyponatremia, who was admitted to [facility] on 09/05/2023 for Hyponatremia [E87.1] Acute metabolic encephalopathy [G93.41]    Severe hyponatremia, patient has history of chronic hyponatremia.  Sodium on ED arrival 104.  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  Sodium remains decreased, 114.  As mentioned yesterday, due to stalled results, we will transition patient to 3% hypertonic saline at 25 mL/h.     LOS: 2 Lynell Kussman 6/11/202510:23 AM

## 2023-09-07 NOTE — Consult Note (Signed)
 PHARMACY CONSULT NOTE - ELECTROLYTES  Pharmacy Consult for Electrolyte Monitoring and Replacement   Recent Labs: Height: 6' (182.9 cm) Weight: 83.5 kg (184 lb 1.4 oz) IBW/kg (Calculated) : 77.6 Estimated Creatinine Clearance: 99.7 mL/min (A) (by C-G formula based on SCr of 0.5 mg/dL (L)). Potassium (mmol/L)  Date Value  09/07/2023 3.3 (L)  03/24/2013 4.1   Magnesium  (mg/dL)  Date Value  62/13/0865 1.9   Calcium (mg/dL)  Date Value  78/46/9629 7.5 (L)   Calcium, Total (mg/dL)  Date Value  52/84/1324 8.8   Albumin (g/dL)  Date Value  40/12/2723 2.8 (L)  03/24/2013 3.6   Phosphorus (mg/dL)  Date Value  36/64/4034 2.0 (L)   Sodium (mmol/L)  Date Value  09/07/2023 115 (LL)  03/24/2013 136    Assessment  Barry Fowler is a 68 y.o. male presenting with altered mental status. PMH significant for tobacco use, hypertension, chronic hyponatremia. Pharmacy has been consulted to monitor and replace electrolytes.  Diet: NPO MIVF: 3% sodium chloride (hypertonic) @ 25 mL/hr Pertinent medications: N/A  Goal of Therapy: Electrolytes WNL  Plan:  Kphos 30mmol IV x 1 Check BMP, Mg, Phos with AM labs  Thank you for allowing pharmacy to be a part of this patient's care.  Rhys Lichty A Taraneh Metheney, PharmD Clinical Pharmacist 09/07/2023 7:49 AM

## 2023-09-07 NOTE — Plan of Care (Signed)

## 2023-09-07 NOTE — Consult Note (Signed)
 PHARMACY CONSULT NOTE - Hypertonic Saline  Pharmacy Consult for Hypertonic Saline Monitoring and Management  Recent Labs: Potassium (mmol/L)  Date Value  09/07/2023 3.3 (L)  03/24/2013 4.1   Magnesium  (mg/dL)  Date Value  95/28/4132 1.9   Calcium (mg/dL)  Date Value  44/03/270 7.5 (L)   Calcium, Total (mg/dL)  Date Value  53/66/4403 8.8   Albumin (g/dL)  Date Value  47/42/5956 2.8 (L)  03/24/2013 3.6   Phosphorus (mg/dL)  Date Value  38/75/6433 2.0 (L)   Sodium (mmol/L)  Date Value  09/07/2023 116 (LL)  03/24/2013 136    Assessment  Barry Fowler is a 68 y.o. male presenting with altered mental status. PMH significant for tobacco use, hypertension, chronic hyponatremia. Pharmacy has been consulted to monitor hypertonic saline (3%) infusion.  Pertinent medications: N/A  Baseline Labs: Serum Na 115, Serum Osm 239, Urine Na pending, Urine Osm pending  Goal of Therapy:  Increase in Na by 4-6 mEq/L in 4-6 hours Do not exceed increase in Na by 10-12 mEq/L in 24 hours  Monitoring:  Date Time Na Rate/Comment  06/11 1140 116 Baseline(HTS started on 1220)  Plan:  Initiate 3% saline at 25 mL/hr Check Na Q4H  Stop infusion if  Na increases > 4 mEq/L in first 2 hours Na increases > 6 mEq/L in first 4 hours Na increases > 6 mEq/L in 6 hours  Na increases > 8 mEq/L in 8 hours (give D5W bolus) Continue to monitor for signs of clinical improvement and recommendations per nephrology  Cristal Don PharmD Clinical Pharmacist @DATE @ 1:28 PM

## 2023-09-07 NOTE — Discharge Instructions (Addendum)
 Intensive Outpatient Programs   [facility] [address] [place],  [place], [place] [phone]  [facility] (Inpatient and [phone] (Suboxone and Methadone) [address] [phone]  [facility]: [facility] Programs - Intensive Outpatient [address] [place], [place] [address]  [phone]  [facility] (Outpatient, Inpatient, [facility] (Groups and Residental) (insurance only) [phone] [place], [place] [phone]   [facility] (for caregivers and family) [address] Laurell Josephs [address] [place], [place] [phone]  Residential Treatment Programs  [facility](2 years) Residential: 106 days)[facility] [address] [place], [place], [place] [phone] or [phone]  [facility] [address] [place], [place], [place] [phone]  [facility] ([facility] [address] [place], [place], [place] [phone] Admissions: 8am-3pm M-F  [facility] Program: Residential Program [phone] Days)             [facility]: [facility]  [place], [place], [place]  [phone] or [phone] in Hours over the weekend or by referral)  [facility] [address] [place], [place] [address] [phone] (Do virtual or phone assessment, offer transportation within 25 miles, have in patient and Outpatient options)   Mobil Crisis: [facility]:[phone] (for crisis  response 24 hours a day)     Some PCP options in [place] area- not a comprehensive list  [facility]- [phone] [facility]- [phone] [facility]- [phone] [facility]- [phone] [facility]- [phone] Lutricia Horsfall- [phone]  or [facility] Health Physician Referral Line [phone]

## 2023-09-08 DIAGNOSIS — G9341 Metabolic encephalopathy: Secondary | ICD-10-CM | POA: Diagnosis not present

## 2023-09-08 LAB — CBC WITH DIFFERENTIAL/PLATELET
Abs Immature Granulocytes: 0.1 10*3/uL — ABNORMAL HIGH (ref 0.00–0.07)
Basophils Absolute: 0 10*3/uL (ref 0.0–0.1)
Basophils Relative: 0 %
Eosinophils Absolute: 0.1 10*3/uL (ref 0.0–0.5)
Eosinophils Relative: 2 %
HCT: 31.9 % — ABNORMAL LOW (ref 39.0–52.0)
Hemoglobin: 11.5 g/dL — ABNORMAL LOW (ref 13.0–17.0)
Immature Granulocytes: 1 %
Lymphocytes Relative: 16 %
Lymphs Abs: 1.5 10*3/uL (ref 0.7–4.0)
MCH: 30.6 pg (ref 26.0–34.0)
MCHC: 36.1 g/dL — ABNORMAL HIGH (ref 30.0–36.0)
MCV: 84.8 fL (ref 80.0–100.0)
Monocytes Absolute: 0.9 10*3/uL (ref 0.1–1.0)
Monocytes Relative: 10 %
Neutro Abs: 6.9 10*3/uL (ref 1.7–7.7)
Neutrophils Relative %: 71 %
Platelets: 321 10*3/uL (ref 150–400)
RBC: 3.76 MIL/uL — ABNORMAL LOW (ref 4.22–5.81)
RDW: 13.5 % (ref 11.5–15.5)
WBC: 9.7 10*3/uL (ref 4.0–10.5)
nRBC: 0 % (ref 0.0–0.2)

## 2023-09-08 LAB — BASIC METABOLIC PANEL WITH GFR
Anion gap: 11 (ref 5–15)
BUN: <5 mg/dL — ABNORMAL LOW (ref 8–23)
CO2: 23 mmol/L (ref 22–32)
Calcium: 7.5 mg/dL — ABNORMAL LOW (ref 8.9–10.3)
Chloride: 86 mmol/L — ABNORMAL LOW (ref 98–111)
Creatinine, Ser: 0.49 mg/dL — ABNORMAL LOW (ref 0.61–1.24)
GFR, Estimated: >60 mL/min (ref >60)
Glucose, Bld: 94 mg/dL (ref 70–99)
Potassium: 3.3 mmol/L — ABNORMAL LOW (ref 3.5–5.1)
Sodium: 120 mmol/L — ABNORMAL LOW (ref 135–145)

## 2023-09-08 LAB — SODIUM
Sodium: 119 mmol/L — CL (ref 135–145)
Sodium: 122 mmol/L — ABNORMAL LOW (ref 135–145)
Sodium: 123 mmol/L — ABNORMAL LOW (ref 135–145)
Sodium: 124 mmol/L — ABNORMAL LOW (ref 135–145)
Sodium: 125 mmol/L — ABNORMAL LOW (ref 135–145)

## 2023-09-08 LAB — MAGNESIUM: Magnesium: 2 mg/dL (ref 1.7–2.4)

## 2023-09-08 LAB — GLUCOSE, CAPILLARY
Glucose-Capillary: 106 mg/dL — ABNORMAL HIGH (ref 70–99)
Glucose-Capillary: 97 mg/dL (ref 70–99)
Glucose-Capillary: 170 mg/dL — ABNORMAL HIGH (ref 70–99)
Glucose-Capillary: 112 mg/dL — ABNORMAL HIGH (ref 70–99)
Glucose-Capillary: 134 mg/dL — ABNORMAL HIGH (ref 70–99)
Glucose-Capillary: 140 mg/dL — ABNORMAL HIGH (ref 70–99)

## 2023-09-08 LAB — PHOSPHORUS: Phosphorus: 2.1 mg/dL — ABNORMAL LOW (ref 2.5–4.6)

## 2023-09-08 MED ORDER — VITAMIN C 500 MG PO TABS
500.0000 mg | ORAL_TABLET | Freq: Two times a day (BID) | ORAL | Status: DC
  Administered 2023-09-08 – 2023-09-13 (×10): 500 mg via ORAL
  Filled 2023-09-08 (×10): qty 1

## 2023-09-08 MED ORDER — POTASSIUM CHLORIDE 10 MEQ/100ML IV SOLN
10.0000 meq | INTRAVENOUS | Status: AC
  Administered 2023-09-08 (×3): 10 meq via INTRAVENOUS
  Filled 2023-09-08 (×3): qty 100

## 2023-09-08 MED ORDER — CHLORHEXIDINE GLUCONATE CLOTH 2 % EX PADS
6.0000 | MEDICATED_PAD | Freq: Every day | CUTANEOUS | Status: DC
  Administered 2023-09-08 – 2023-09-10 (×3): 6 via TOPICAL

## 2023-09-08 MED ORDER — CALCIUM GLUCONATE-NACL 1-0.675 GM/50ML-% IV SOLN
1.0000 g | Freq: Once | INTRAVENOUS | Status: AC
  Administered 2023-09-08: 1000 mg via INTRAVENOUS
  Filled 2023-09-08: qty 50

## 2023-09-08 MED ORDER — POTASSIUM PHOSPHATES 15 MMOLE/5ML IV SOLN
30.0000 mmol | Freq: Once | INTRAVENOUS | Status: AC
  Administered 2023-09-08: 30 mmol via INTRAVENOUS
  Filled 2023-09-08: qty 10

## 2023-09-08 MED ORDER — ENSURE PLUS HIGH PROTEIN PO LIQD
237.0000 mL | Freq: Three times a day (TID) | ORAL | Status: DC
  Administered 2023-09-08 – 2023-09-13 (×12): 237 mL via ORAL

## 2023-09-08 NOTE — Evaluation (Signed)
 Clinical/Bedside Swallow Evaluation Patient Details  Name: Barry Fowler MRN: [id number] Date of Birth: 1956/01/15  Today's Date: 09/08/2023 Time: SLP Start Time (ACUTE ONLY): 1025 SLP Stop Time (ACUTE ONLY): 1043 SLP Time Calculation (min) (ACUTE ONLY): 18 min  Past Medical History:  Past Medical History:  Diagnosis Date   Hypertension    Past Surgical History:  Past Surgical History:  Procedure Laterality Date   EYE SURGERY     HPI:  Per H&P, 68 y.o male with significant PMH of EtOH abuse, tobacco abuse, hypertension, chronic hyponatremia and recurrent falls who presented to the ED with chief complaints of fall.     Per ED reports, patient was found down by sister who called EMS.  It is unclear how long he has been down but reported he fell on Thursday and sustained multiple bruises.  Patient sister reported he has been binge drinking and she normally checks on him a few times per week. 6/9 CT Head: No acute intracranial pathology. Small-vessel white matter  disease.  2. No displaced fractures or dislocations of the facial bones.  3. Multiple soft tissue contusions, including of the right forehead,  overlying the right orbit, and right cheek, as well as the scalp  vertex and right parietal scalp.  4. No fracture or static subluxation of the cervical spine.  5. Moderate multilevel cervical disc degenerative disease. CXR 6/9: New left retrocardiac opacity.    Assessment / Plan / Recommendation  Clinical Impression  Pt seen for bedside swallow assessment in the setting of failed nursing swallow screen. Pt seen with trials of thin liquids (via straw), puree, and regular solids. With set-up and extended time, pt demonstrating ind self feeding. No overt or subtle s/sx pharyngeal dysphagia noted. No change to vocal quality across trials. Vitals stable for duration of trials (94-96 O2 saturations on 3L). Oral phase impacted by edentulous nature (dentures not present in the room), requiring moistened  solids and liquid wash to aid oral clearance.  Pt denied history of PNA, GERD, or dysphagia. Based on current debility, pt is at min increased risk of aspiration, therefore recommend aspiration precautions (slow rate, small bites, elevated HOB, and alert for PO intake). Intermittent supervision for PO intake and set up to facilitate self feeding. Recommend mech soft/chopped meat and thin liquids. MD and RN aware of recommendations. No further SLP services indicated.   SLP Visit Diagnosis: Dysphagia, unspecified (R13.10) (related to reduced alertness during hospital course and ETOH)    Aspiration Risk  Mild aspiration risk    Diet Recommendation   Thin;Dysphagia 3 (mechanical soft) (chopped meat)  Medication Administration: Whole meds with liquid    Other  Recommendations Oral Care Recommendations: Oral care before and after PO;Staff/trained caregiver to provide oral care     Assistance Recommended at Discharge    Functional Status Assessment Patient has not had a recent decline in their functional status    Swallow Study   General Date of Onset: 09/08/23 HPI: Per H&P, 68 y.o male with significant PMH of EtOH abuse, tobacco abuse, hypertension, chronic hyponatremia and recurrent falls who presented to the ED with chief complaints of fall.     Per ED reports, patient was found down by sister who called EMS.  It is unclear how long he has been down but reported he fell on Thursday and sustained multiple bruises.  Patient sister reported he has been binge drinking and she normally checks on him a few times per week. 6/9 CT Head: No acute intracranial  pathology. Small-vessel white matter  disease.  2. No displaced fractures or dislocations of the facial bones.  3. Multiple soft tissue contusions, including of the right forehead,  overlying the right orbit, and right cheek, as well as the scalp  vertex and right parietal scalp.  4. No fracture or static subluxation of the cervical spine.  5.  Moderate multilevel cervical disc degenerative disease. CXR 6/9: New left retrocardiac opacity. Type of Study: Bedside Swallow Evaluation Previous Swallow Assessment: none in chart Diet Prior to this Study: NPO Temperature Spikes Noted: No (WBC 9.7) Respiratory Status: Nasal cannula (3L) History of Recent Intubation: No Behavior/Cognition: Alert;Cooperative (requires extra time) Oral Cavity Assessment: Dry Oral Care Completed by SLP: Yes Oral Cavity - Dentition: Edentulous (dentures not present in the room) Vision: Functional for self-feeding Self-Feeding Abilities: Able to feed self;Needs set up Patient Positioning: Upright in bed Baseline Vocal Quality: Normal Volitional Cough: Strong Volitional Swallow: Able to elicit    Oral/Motor/Sensory Function Overall Oral Motor/Sensory Function: Within functional limits   Ice Chips Ice chips: Within functional limits   Thin Liquid Thin Liquid: Within functional limits Presentation: Self Fed;Straw    Nectar Thick Nectar Thick Liquid: Not tested   Honey Thick Honey Thick Liquid: Not tested   Puree Puree: Within functional limits Presentation: Self Fed;Spoon   Solid     Solid: Impaired Presentation: Self Fed Oral Phase Impairments: Impaired mastication Oral Phase Functional Implications: Impaired mastication (related to baseline edentulous nature) Pharyngeal Phase Impairments:  (none)     [place] Watson Robarge Clapp, MS, CCC-SLP Speech Language Pathologist Rehab Services; [facility] [phone] (ascom)   [place] J Clapp 09/08/2023,10:43 AM

## 2023-09-08 NOTE — Consult Note (Signed)
 PHARMACY CONSULT NOTE - Hypertonic Saline  Pharmacy Consult for Hypertonic Saline Monitoring and Management  Recent Labs: Potassium (mmol/L)  Date Value  09/08/2023 3.3 (L)  03/24/2013 4.1   Magnesium  (mg/dL)  Date Value  82/95/6213 2.0   Calcium (mg/dL)  Date Value  08/65/7846 7.5 (L)   Calcium, Total (mg/dL)  Date Value  96/29/5284 8.8   Albumin (g/dL)  Date Value  13/24/4010 2.8 (L)  03/24/2013 3.6   Phosphorus (mg/dL)  Date Value  27/25/3664 2.1 (L)   Sodium (mmol/L)  Date Value  09/08/2023 125 (L)  03/24/2013 136    Assessment  Barry Fowler is a 68 y.o. male presenting with altered mental status. PMH significant for tobacco use, hypertension, chronic hyponatremia. Pharmacy has been consulted to monitor hypertonic saline (3%) infusion.  Pertinent medications: N/A  Baseline Labs: Serum Na 115, Serum Osm 239, Urine Na pending, Urine Osm pending  Goal of Therapy:  Increase in Na by 4-6 mEq/L in 4-6 hours Do not exceed increase in Na by 10-12 mEq/L in 24 hours  Monitoring:  Date Time Na Rate/Comment  06/11 1140 116 Baseline (HTS@25ml /hr started on 1220) 06/11 1547 118 06/11 2244 119 06/12 0318 120 Rate change @ 0714 to 43ml/hr 06/12 0824 122  06/12 1220 123  06/12   1601    124 06/12 2000 125  Plan:  Continue 3% saline at 30 mL/hr (increase of 7 over past 24 hours) Check Na Q4H  Stop infusion if  Na increases > 4 mEq/L in first 2 hours Na increases > 6 mEq/L in first 4 hours Na increases > 6 mEq/L in 6 hours  Na increases > 8 mEq/L in 8 hours (give D5W bolus) Continue to monitor for signs of clinical improvement and recommendations per nephrology  Scotty Cyphers, PharmD 09/08/2023 8:35 PM

## 2023-09-08 NOTE — Progress Notes (Signed)
 PROGRESS NOTE    Barry Fowler  JYN:[id number] DOB: 05-21-1955 DOA: 09/05/2023 PCP: Patient, No Pcp Per    Brief Narrative:  68 y.o male with significant PMH of EtOH abuse, tobacco abuse, hypertension, chronic hyponatremia and recurrent falls who presented to the ED with chief complaints of fall.   Per ED reports, patient was found down by sister who called EMS.  It is unclear how long he has been down but reported he fell on Thursday and sustained multiple bruises.  Patient sister reported he has been binge drinking and she normally checks on him a few times per week. 09/05/2023 - Admitted to the ICU with severe hyponatremia and started on gentle hydration with NS.  09/06/2023 - Started on phenobarb taper. Sodium correcting adequately 6/12: Sodium continues to correct.  On 3% saline at 30 cc/h.  Remains on phenobarbital taper.     Assessment & Plan:   Principal Problem:   Acute metabolic encephalopathy Active Problems:   Malnutrition of moderate degree Press is a 68 year old male patient with a past medical history of alcohol abuse, tobacco abuse, hypertension, chronic hyponatremia with sodium between 125 and 130 presenting to the ED with chief complaint of fall. He was found to have severe hyponatremia with sodium of 104. Thought to be secondary to decreased salt intake, dehydration and binge drinking with possible beer potomania.   Severe hyposmolar hyponatremia with increased urine osm and decreased urine sodium consistent with hypovolemia, some component of decreased salt intake.  Continue 3% saline Goal 8 to 9 mEq within 24 hours Nephrology following  Alcohol abuse Acute alcohol withdrawal Phenobarbital taper High-dose IV thiamine   Possible CAP On Rocephin  and doxycycline  DVT prophylaxis: SQ Lovenox Code Status: Full Family Communication: None Disposition Plan: Status is: Inpatient Remains inpatient appropriate because: Severe hyponatremia   Level of care:  Stepdown  Consultants:  None  Procedures:  None  Antimicrobials: None   Subjective: Seen and examined.  Lethargic but awakens easily.  Answers all questions appropriately.  Objective: Vitals:   09/08/23 1200 09/08/23 1245 09/08/23 1300 09/08/23 1400  BP: (!) 84/55 113/85    Pulse: 67 89 95 97  Resp: 18 (!) 35 (!) 23 18  Temp:      TempSrc:      SpO2: 98% 95% 97% 92%  Weight:      Height:        Intake/Output Summary (Last 24 hours) at 09/08/2023 1443 Last data filed at 09/08/2023 1400 Gross per 24 hour  Intake 2129.87 ml  Output 2050 ml  Net 79.87 ml   Filed Weights   09/06/23 0327 09/07/23 0500 09/08/23 0430  Weight: 79.8 kg 83.5 kg 82.1 kg    Examination:  General exam: Appears lethargic and chronically ill Respiratory system: Scattered crackle.  No work of breathing.  Room air Cardiovascular system: S1-S2, RRR, no murmurs, no pedal edema Gastrointestinal system: Soft, NT ND, normal bowel sounds Central nervous system: Alert.  Lethargic.  Oriented x 3.  No focal deficits Extremities: Symmetric 5 x 5 power. Skin: No rashes, lesions or ulcers Psychiatry: Judgement and insight appear impaired. Mood & affect flattened.     Data Reviewed: I have personally reviewed following labs and imaging studies  CBC: Recent Labs  Lab 09/05/23 1755 09/06/23 0321 09/07/23 0337 09/08/23 0318  WBC 13.9* 13.3* 10.3 9.7  NEUTROABS 10.5*  --   --  6.9  HGB 12.1* 11.6* 10.3* 11.5*  HCT RESULTS UNAVAILABLE DUE TO INTERFERING SUBSTANCE RESULTS UNAVAILABLE DUE TO  INTERFERING SUBSTANCE 27.7* 31.9*  MCV RESULTS UNAVAILABLE DUE TO INTERFERING SUBSTANCE RESULTS UNAVAILABLE DUE TO INTERFERING SUBSTANCE 82.4 84.8  PLT 290 313 291 321   Basic Metabolic Panel: Recent Labs  Lab 09/05/23 1755 09/05/23 2025 09/06/23 0112 09/06/23 0321 09/06/23 0705 09/07/23 0337 09/07/23 0809 09/07/23 2244 09/08/23 0010 09/08/23 0318 09/08/23 0824 09/08/23 1220  NA 104* 107* 109* 110*   <  > 115*   < > 119* 119* 120* 122* 123*  K 3.7  --  3.4* 3.3*  --  3.3*  --   --   --  3.3*  --   --   CL 70*  --  74* 76*  --  82*  --   --   --  86*  --   --   CO2 17*  --  19* 20*  --  25  --   --   --  23  --   --   GLUCOSE 78  --  75 75  --  90  --   --   --  94  --   --   BUN 9  --  6* 7*  --  <5*  --   --   --  <5*  --   --   CREATININE 0.51*  --  0.62 0.53*  --  0.50*  --   --   --  0.49*  --   --   CALCIUM 7.5*  --  7.8* 7.5*  --  7.5*  --   --   --  7.5*  --   --   MG 1.8 1.8  --  1.9  --  1.9  --   --   --  2.0  --   --   PHOS  --   --   --  2.0*  --  2.0*  --   --   --  2.1*  --   --    < > = values in this interval not displayed.   GFR: Estimated Creatinine Clearance: 99.7 mL/min (A) (by C-G formula based on SCr of 0.49 mg/dL (L)). Liver Function Tests: Recent Labs  Lab 09/05/23 1755  AST 123*  ALT 42  ALKPHOS 48  BILITOT 1.9*  PROT 6.0*  ALBUMIN 2.8*   No results for input(s): LIPASE, AMYLASE in the last 168 hours. No results for input(s): AMMONIA in the last 168 hours. Coagulation Profile: No results for input(s): INR, PROTIME in the last 168 hours. Cardiac Enzymes: Recent Labs  Lab 09/05/23 1755  CKTOTAL 3,815*   BNP (last 3 results) No results for input(s): PROBNP in the last 8760 hours. HbA1C: No results for input(s): HGBA1C in the last 72 hours. CBG: Recent Labs  Lab 09/07/23 1939 09/07/23 2320 09/08/23 0402 09/08/23 0749 09/08/23 1120  GLUCAP 119* 98 106* 97 170*   Lipid Profile: No results for input(s): CHOL, HDL, LDLCALC, TRIG, CHOLHDL, LDLDIRECT in the last 72 hours. Thyroid Function Tests: Recent Labs    09/05/23 2325  TSH 0.635  FREET4 1.18*   Anemia Panel: No results for input(s): VITAMINB12, FOLATE, FERRITIN, TIBC, IRON, RETICCTPCT in the last 72 hours. Sepsis Labs: Recent Labs  Lab 09/05/23 2325 09/06/23 0347  PROCALCITON 0.25  --   LATICACIDVEN  --  1.4    Recent Results (from the  past 240 hours)  MRSA Next Gen by PCR, Nasal     Status: None   Collection Time: 09/05/23  8:25 PM   Specimen:  Nasal Mucosa; Nasal Swab  Result Value Ref Range Status   MRSA by PCR Next Gen NOT DETECTED NOT DETECTED Final    Comment: (NOTE) The GeneXpert MRSA Assay (FDA approved for NASAL specimens only), is one component of a comprehensive MRSA colonization surveillance program. It is not intended to diagnose MRSA infection nor to guide or monitor treatment for MRSA infections. Test performance is not FDA approved in patients less than 43 years old. Performed at [facility], [address] [place], [place] [address]   Culture, blood (Routine X 2) w Reflex to ID Panel     Status: None (Preliminary result)   Collection Time: 09/06/23  1:12 AM   Specimen: BLOOD  Result Value Ref Range Status   Specimen Description BLOOD BLOOD LEFT ARM  Final   Special Requests   Final    BOTTLES DRAWN AEROBIC AND ANAEROBIC Blood Culture adequate volume   Culture   Final    NO GROWTH 2 DAYS Performed at [facility], [address] [place], [place] [address]    Report Status PENDING  Incomplete  Culture, blood (Routine X 2) w Reflex to ID Panel     Status: None (Preliminary result)   Collection Time: 09/06/23  1:12 AM   Specimen: BLOOD  Result Value Ref Range Status   Specimen Description BLOOD BLOOD LEFT ARM  Final   Special Requests Blood Culture adequate volume  Final   Culture   Final    NO GROWTH 2 DAYS Performed at [facility], [address] [place], [place] [address]    Report Status PENDING  Incomplete         Radiology Studies: No results found.      Scheduled Meds:  vitamin C  500 mg Oral BID   Chlorhexidine  Gluconate Cloth  6 each Topical Daily   enoxaparin (LOVENOX) injection  40 mg Subcutaneous Q24H   feeding supplement  237 mL Oral TID BM   folic acid   1 mg Oral Daily   multivitamin with minerals  1 tablet Oral Daily    PHENObarbital  65 mg Intravenous Q8H   Followed by   [START ON 09/10/2023] PHENObarbital  32.5 mg Intravenous Q8H   [START ON 09/10/2023] thiamine  (VITAMIN B1) injection  100 mg Intravenous Daily   Continuous Infusions:  cefTRIAXone  (ROCEPHIN )  IV Stopped (09/07/23 1215)   doxycycline (VIBRAMYCIN) IV Stopped (09/08/23 0155)   potassium PHOSPHATE IVPB (in mmol) 85 mL/hr at 09/08/23 1200   sodium chloride  (hypertonic) 30 mL/hr at 09/08/23 1200   thiamine  (VITAMIN B1) injection Stopped (09/08/23 1021)     LOS: 3 days     Tiajuana Fluke, MD [facility]   If 7PM-7AM, please contact night-coverage  09/08/2023, 2:43 PM

## 2023-09-08 NOTE — Evaluation (Signed)
 Occupational Therapy Evaluation Patient Details Name: Barry Fowler MRN: [id number] DOB: 1955/08/01 Today's Date: 09/08/2023   History of Present Illness   68 y.o male with significant PMH of EtOH abuse, tobacco abuse, hypertension, chronic hyponatremia and recurrent falls who presented to the ED with chief complaints of fall. Admitted for severe hyponatremia with sodium of 104.    Clinical Impressions Barry Fowler was seen for OT evaluation this date. Prior to hospital admission, pt was MOD I using 4WW, endorses x3 falls in 6 months. Pt lives alone in 2nd floor apartment (elevator access). Pt currently requires SBA exit bed, fair sitting balance ~15 min. SETUP + SUPERVISION self-feeding in sitting, cues for aspiration risk. MIN A x2 + RW for ADL t/f ~20 ft, multiple lateral LOBs requiring MAX A to correct. Pt demonstrated ataxic gait with poor safety awareness - RN notified pt is high fall risk. Pt would benefit from skilled OT to address noted impairments and functional limitations (see below for any additional details). Upon hospital discharge, recommend OT follow up <3 hours/day.     If plan is discharge home, recommend the following:   Two people to help with walking and/or transfers;Two people to help with bathing/dressing/bathroom;Help with stairs or ramp for entrance     Functional Status Assessment   Patient has had a recent decline in their functional status and demonstrates the ability to make significant improvements in function in a reasonable and predictable amount of time.     Equipment Recommendations   BSC/3in1;Other (comment) (RW)     Recommendations for Other Services         Precautions/Restrictions   Precautions Precautions: Fall Recall of Precautions/Restrictions: Intact Restrictions Weight Bearing Restrictions Per Provider Order: No     Mobility Bed Mobility Overal bed mobility: Needs Assistance Bed Mobility: Sit to Supine, Supine to Sit      Supine to sit: Contact guard Sit to supine: Min assist        Transfers Overall transfer level: Needs assistance Equipment used: Rolling walker (2 wheels) Transfers: Sit to/from Stand Sit to Stand: Min assist                  Balance Overall balance assessment: Needs assistance Sitting-balance support: No upper extremity supported, Feet supported Sitting balance-Leahy Scale: Fair     Standing balance support: Bilateral upper extremity supported Standing balance-Leahy Scale: Poor                             ADL either performed or assessed with clinical judgement   ADL Overall ADL's : Needs assistance/impaired                                       General ADL Comments: SETUP + SUPERVISION self-feeding in sitting, cues for aspiration risk. MIN A x2 + RW for ADL t/f ~20 ft, multiple lateral LOBs requiring MAX A to correct.     Vision         Perception         Praxis         Pertinent Vitals/Pain Pain Assessment Pain Assessment: 0-10 Pain Score: 4  Pain Location: head Pain Descriptors / Indicators: Headache Pain Intervention(s): Limited activity within patient's tolerance, Repositioned     Extremity/Trunk Assessment Upper Extremity Assessment Upper Extremity Assessment: Overall WFL for tasks assessed   Lower Extremity  Assessment Lower Extremity Assessment: Generalized weakness       Communication Communication Communication: No apparent difficulties   Cognition Arousal: Alert Behavior During Therapy: WFL for tasks assessed/performed Cognition: No apparent impairments                               Following commands: Intact       Cueing  General Comments   Cueing Techniques: Verbal cues  SUPINE: BP 86/58, HR 72. SEATED: BP 113/85, HR 85.   Exercises     Shoulder Instructions      Home Living Family/patient expects to be discharged to:: Private residence Living Arrangements:  Alone Available Help at Discharge: Family;Available PRN/intermittently Type of Home: Apartment Home Access: Elevator     Home Layout: One level               Home Equipment: Rollator (4 wheels)   Additional Comments: 2nd floor apartment      Prior Functioning/Environment Prior Level of Function : Independent/Modified Independent;History of Falls (last six months)             Mobility Comments: reports x3 falls this year, household mobility with 4WW ADLs Comments: sister checks on him couple times/week, assist with meals/transportation    OT Problem List: Decreased strength;Decreased range of motion;Decreased activity tolerance;Impaired balance (sitting and/or standing)   OT Treatment/Interventions: Self-care/ADL training;Therapeutic exercise;Energy conservation;DME and/or AE instruction;Therapeutic activities      OT Goals(Current goals can be found in the care plan section)   Acute Rehab OT Goals Patient Stated Goal: to walk without falling OT Goal Formulation: With patient Time For Goal Achievement: 09/22/23 Potential to Achieve Goals: Good ADL Goals Pt Will Perform Grooming: with modified independence;standing Pt Will Perform Lower Body Dressing: with modified independence;sit to/from stand Pt Will Transfer to Toilet: with modified independence;ambulating;regular height toilet   OT Frequency:  Min 2X/week    Co-evaluation              AM-PAC OT 6 Clicks Daily Activity     Outcome Measure Help from another person eating meals?: None Help from another person taking care of personal grooming?: A Little Help from another person toileting, which includes using toliet, bedpan, or urinal?: A Lot Help from another person bathing (including washing, rinsing, drying)?: A Lot Help from another person to put on and taking off regular upper body clothing?: A Little Help from another person to put on and taking off regular lower body clothing?: A Lot 6  Click Score: 16   End of Session Equipment Utilized During Treatment: Rolling walker (2 wheels);Gait belt Nurse Communication: Mobility status  Activity Tolerance: Patient tolerated treatment well Patient left: in bed;with call bell/phone within reach;with bed alarm set  OT Visit Diagnosis: Other abnormalities of gait and mobility (R26.89);Muscle weakness (generalized) (M62.81)                Time: 1610-9604 OT Time Calculation (min): 45 min Charges:  OT General Charges $OT Visit: 1 Visit OT Evaluation $OT Eval High Complexity: 1 High OT Treatments $Self Care/Home Management : 23-37 mins  Gordan Latina, M.S. OTR/L  09/08/23, 3:38 PM  ascom [phone]

## 2023-09-08 NOTE — Evaluation (Signed)
 Physical Therapy Evaluation Patient Details Name: Barry Fowler MRN: [id number] DOB: [birth year] Today's Date: 09/08/2023  History of Present Illness  68 y.o male with significant PMH of EtOH abuse, tobacco abuse, hypertension, chronic hyponatremia and recurrent falls who presented to the ED with chief complaints of fall. Admitted for severe hyponatremia with sodium of 104.  Clinical Impression  Patient alert, agreeable to PT, reported needing to have a BM. At baseline the pt reported use of rollator, lives alone, grocery shopping performed by pt's sister, pt normally modI/I for ADLs. CGA for bed mobility, and minAx1-2 assist for transfers with RW. Pt incredibly ataxic with very wide BOS to ambulate to and from commode. Very poor safety awareness, 3+ assist to return from commode. IV noted for blood, nursing staff in room to assist with pericare and assessment of IV sites at end of session.  Overall the patient demonstrated deficits (see PT Problem List) that impede the patient's functional abilities, safety, and mobility and would benefit from skilled PT intervention.          If plan is discharge home, recommend the following: Two people to help with walking and/or transfers;Two people to help with bathing/dressing/bathroom;Help with stairs or ramp for entrance;Assist for transportation;Assistance with cooking/housework;Supervision due to cognitive status   Can travel by private vehicle   No    Equipment Recommendations Other (comment) (TBD)  Recommendations for Other Services       Functional Status Assessment Patient has had a recent decline in their functional status and demonstrates the ability to make significant improvements in function in a reasonable and predictable amount of time.     Precautions / Restrictions Precautions Precautions: Fall Recall of Precautions/Restrictions: Intact Restrictions Weight Bearing Restrictions Per Provider Order: No      Mobility  Bed  Mobility Overal bed mobility: Needs Assistance Bed Mobility: Sit to Supine, Supine to Sit     Supine to sit: Contact guard Sit to supine: Contact guard assist        Transfers Overall transfer level: Needs assistance Equipment used: Rolling walker (2 wheels) Transfers: Sit to/from Stand Sit to Stand: Min assist, +2 safety/equipment           General transfer comment: from commode in ICU 2+ minA    Ambulation/Gait Ambulation/Gait assistance: Min assist, +2 physical assistance Gait Distance (Feet): 15 Feet Assistive device: Rolling walker (2 wheels)   Gait velocity: decreased     General Gait Details: 3+ assist to walk safely wth IV pole lines/leads verbal cues. pt incredibly ataxic with wide BOS, very poor safety awareness  Stairs            Wheelchair Mobility     Tilt Bed    Modified Rankin (Stroke Patients Only)       Balance Overall balance assessment: Needs assistance Sitting-balance support: No upper extremity supported, Feet supported Sitting balance-Leahy Scale: Fair     Standing balance support: Bilateral upper extremity supported Standing balance-Leahy Scale: Poor                               Pertinent Vitals/Pain Pain Assessment Pain Assessment: 0-10 Pain Score: 4  Pain Location: head Pain Descriptors / Indicators: Aching Pain Intervention(s): Limited activity within patient's tolerance, Monitored during session, Repositioned    Home Living Family/patient expects to be discharged to:: Private residence Living Arrangements: Alone Available Help at Discharge: Family;Available PRN/intermittently Type of Home: Apartment Home Access: Elevator  Home Layout: One level Home Equipment: Rollator (4 wheels) Additional Comments: 2nd floor apartment    Prior Function Prior Level of Function : Independent/Modified Independent;History of Falls (last six months)             Mobility Comments: reports x3 falls this  year, household mobility with 4WW ADLs Comments: sister checks on him couple times/week, assist with meals/transportation     Extremity/Trunk Assessment   Upper Extremity Assessment Upper Extremity Assessment: Overall WFL for tasks assessed    Lower Extremity Assessment Lower Extremity Assessment: Generalized weakness       Communication   Communication Communication: No apparent difficulties    Cognition Arousal: Alert Behavior During Therapy: WFL for tasks assessed/performed                             Following commands: Intact       Cueing Cueing Techniques: Verbal cues     General Comments General comments (skin integrity, edema, etc.): SUPINE: BP 86/58, HR 72. SEATED: BP 113/85, HR 85.    Exercises     Assessment/Plan    PT Assessment Patient needs continued PT services  PT Problem List Decreased strength;Decreased activity tolerance;Decreased balance;Decreased mobility;Decreased safety awareness;Decreased knowledge of use of DME       PT Treatment Interventions DME instruction;Balance training;Neuromuscular re-education;Stair training;Gait training;Functional mobility training;Patient/family education;Therapeutic activities;Therapeutic exercise    PT Goals (Current goals can be found in the Care Plan section)  Acute Rehab PT Goals Patient Stated Goal: to get his strength back PT Goal Formulation: With patient Time For Goal Achievement: 09/22/23 Potential to Achieve Goals: Good    Frequency Min 2X/week     Co-evaluation               AM-PAC PT 6 Clicks Mobility  Outcome Measure Help needed turning from your back to your side while in a flat bed without using bedrails?: A Little Help needed moving from lying on your back to sitting on the side of a flat bed without using bedrails?: A Little Help needed moving to and from a bed to a chair (including a wheelchair)?: A Lot Help needed standing up from a chair using your arms  (e.g., wheelchair or bedside chair)?: A Lot Help needed to walk in hospital room?: A Lot Help needed climbing 3-5 steps with a railing? : A Lot 6 Click Score: 14    End of Session Equipment Utilized During Treatment: Gait belt Activity Tolerance: Patient limited by fatigue Patient left: in bed;with call bell/phone within reach;with bed alarm set;with nursing/sitter in room Nurse Communication: Mobility status PT Visit Diagnosis: Other abnormalities of gait and mobility (R26.89);Difficulty in walking, not elsewhere classified (R26.2);Muscle weakness (generalized) (M62.81)    Time: 9604-5409 PT Time Calculation (min) (ACUTE ONLY): 34 min   Charges:   PT Evaluation $PT Eval Low Complexity: 1 Low PT Treatments $Therapeutic Activity: 23-37 mins PT General Charges $$ ACUTE PT VISIT: 1 Visit         Darien Eden PT, DPT 3:57 PM,09/08/23

## 2023-09-08 NOTE — Consult Note (Signed)
 PHARMACY CONSULT NOTE - ELECTROLYTES  Pharmacy Consult for Electrolyte Monitoring and Replacement   Recent Labs: Height: 6' (182.9 cm) Weight: 82.1 kg (181 lb) IBW/kg (Calculated) : 77.6 Estimated Creatinine Clearance: 99.7 mL/min (A) (by C-G formula based on SCr of 0.49 mg/dL (L)). Potassium (mmol/L)  Date Value  09/08/2023 3.3 (L)  03/24/2013 4.1   Magnesium  (mg/dL)  Date Value  40/98/1191 2.0   Calcium (mg/dL)  Date Value  47/82/9562 7.5 (L)   Calcium, Total (mg/dL)  Date Value  13/10/6576 8.8   Albumin (g/dL)  Date Value  46/96/2952 2.8 (L)  03/24/2013 3.6   Phosphorus (mg/dL)  Date Value  84/13/2440 2.1 (L)   Sodium (mmol/L)  Date Value  09/08/2023 120 (L)  03/24/2013 136    Assessment  Barry Fowler is a 68 y.o. male presenting with altered mental status. PMH significant for tobacco use, hypertension, chronic hyponatremia. Pharmacy has been consulted to monitor and replace electrolytes.  Diet: NPO MIVF: 3% sodium chloride (hypertonic) @ 30 mL/hr Pertinent medications: N/A  Goal of Therapy: Electrolytes WNL  Plan:  K 3.3, KCl 10 mEq IV x 3 doses Corrected Ca: 8.46, Calcium Gluconate 1 gm x 1 dose Reorder Kphos 30mmol IV x 1 Check BMP, Mg, Phos with AM labs  Thank you for allowing pharmacy to be a part of this patient's care.  Coretta Dexter, PharmD, [facility] 09/08/2023 5:20 AM

## 2023-09-08 NOTE — Consult Note (Signed)
 PHARMACY CONSULT NOTE - Hypertonic Saline  Pharmacy Consult for Hypertonic Saline Monitoring and Management  Recent Labs: Potassium (mmol/L)  Date Value  09/08/2023 3.3 (L)  03/24/2013 4.1   Magnesium  (mg/dL)  Date Value  16/12/9602 2.0   Calcium (mg/dL)  Date Value  54/11/8117 7.5 (L)   Calcium, Total (mg/dL)  Date Value  14/78/2956 8.8   Albumin (g/dL)  Date Value  21/30/8657 2.8 (L)  03/24/2013 3.6   Phosphorus (mg/dL)  Date Value  84/69/6295 2.1 (L)   Sodium (mmol/L)  Date Value  09/08/2023 124 (L)  03/24/2013 136    Assessment  Barry Fowler is a 68 y.o. male presenting with altered mental status. PMH significant for tobacco use, hypertension, chronic hyponatremia. Pharmacy has been consulted to monitor hypertonic saline (3%) infusion.  Pertinent medications: N/A  Baseline Labs: Serum Na 115, Serum Osm 239, Urine Na pending, Urine Osm pending  Goal of Therapy:  Increase in Na by 4-6 mEq/L in 4-6 hours Do not exceed increase in Na by 10-12 mEq/L in 24 hours  Monitoring:  Date Time Na Rate/Comment  06/11 1140 116 Baseline (HTS@25ml /hr started on 1220) 06/11 1547 118 06/11 2244 119 06/12 0318 120 Rate change @ 0714 to 79ml/hr 06/12 0824 122  06/12 1220 123  06/12   1601    124  Plan:  Continue 3% saline at 30 mL/hr (increase of 6 over past 24 hours) Check Na Q4H  Stop infusion if  Na increases > 4 mEq/L in first 2 hours Na increases > 6 mEq/L in first 4 hours Na increases > 6 mEq/L in 6 hours  Na increases > 8 mEq/L in 8 hours (give D5W bolus) Continue to monitor for signs of clinical improvement and recommendations per nephrology  Pansy Bogus, PharmD Pharmacy Resident  09/08/2023 4:52 PM

## 2023-09-08 NOTE — Progress Notes (Signed)
 [facility]  ROUNDING NOTE   Subjective:   Patient seen resting quietly Easily aroused Tolerating small meals.  Sodium 120 3% hypertonic saline infusing    Objective:  Vital signs in last 24 hours:  Temp:  [97.4 F (36.3 C)-98.6 F (37 C)] 98.2 F (36.8 C) (06/12 0800) Pulse Rate:  [62-74] 68 (06/12 0800) Resp:  [15-20] 15 (06/12 0800) BP: (85-101)/(54-67) 87/56 (06/12 0800) SpO2:  [90 %-100 %] 97 % (06/12 0800) Weight:  [82.1 kg] 82.1 kg (06/12 0430)  Weight change: -1.4 kg Filed Weights   09/06/23 0327 09/07/23 0500 09/08/23 0430  Weight: 79.8 kg 83.5 kg 82.1 kg    Intake/Output: I/O last 3 completed shifts: In: 2299.5 [I.V.:697.6; IV Piggyback:1601.9] Out: 1950 [Urine:1950]   Intake/Output this shift:  Total I/O In: 350.9 [I.V.:149.3; IV Piggyback:201.6] Out: 500 [Urine:500]  Physical Exam: General: NAD  Head: Normocephalic, atraumatic. Moist oral mucosal membranes  Eyes: Right periorbital hematoma    Neck: Supple  Lungs:  Clear to auscultation  Heart: Regular rate and rhythm  Abdomen:  Soft, nontender  Extremities:  No peripheral edema.  Neurologic: Awake, alert, conversant  Skin: Warm,dry, Generalized bruising        Basic Metabolic Panel: Recent Labs  Lab 09/05/23 1755 09/05/23 2025 09/06/23 0112 09/06/23 0321 09/06/23 0705 09/07/23 4098 09/07/23 0809 09/07/23 1946 09/07/23 2244 09/08/23 0010 09/08/23 0318 09/08/23 0824  NA 104* 107* 109* 110*   < > 115*   < > 118* 119* 119* 120* 122*  K 3.7  --  3.4* 3.3*  --  3.3*  --   --   --   --  3.3*  --   CL 70*  --  74* 76*  --  82*  --   --   --   --  86*  --   CO2 17*  --  19* 20*  --  25  --   --   --   --  23  --   GLUCOSE 78  --  75 75  --  90  --   --   --   --  94  --   BUN 9  --  6* 7*  --  <5*  --   --   --   --  <5*  --   CREATININE 0.51*  --  0.62 0.53*  --  0.50*  --   --   --   --  0.49*  --   CALCIUM 7.5*  --  7.8* 7.5*  --  7.5*  --   --   --   --  7.5*  --   MG  1.8 1.8  --  1.9  --  1.9  --   --   --   --  2.0  --   PHOS  --   --   --  2.0*  --  2.0*  --   --   --   --  2.1*  --    < > = values in this interval not displayed.    Liver Function Tests: Recent Labs  Lab 09/05/23 1755  AST 123*  ALT 42  ALKPHOS 48  BILITOT 1.9*  PROT 6.0*  ALBUMIN 2.8*   No results for input(s): LIPASE, AMYLASE in the last 168 hours. No results for input(s): AMMONIA in the last 168 hours.  CBC: Recent Labs  Lab 09/05/23 1755 09/06/23 0321 09/07/23 0337 09/08/23 0318  WBC 13.9* 13.3* 10.3 9.7  NEUTROABS 10.5*  --   --  6.9  HGB 12.1* 11.6* 10.3* 11.5*  HCT RESULTS UNAVAILABLE DUE TO INTERFERING SUBSTANCE RESULTS UNAVAILABLE DUE TO INTERFERING SUBSTANCE 27.7* 31.9*  MCV RESULTS UNAVAILABLE DUE TO INTERFERING SUBSTANCE RESULTS UNAVAILABLE DUE TO INTERFERING SUBSTANCE 82.4 84.8  PLT 290 313 291 321    Cardiac Enzymes: Recent Labs  Lab 09/05/23 1755  CKTOTAL 3,815*    BNP: Invalid input(s): POCBNP  CBG: Recent Labs  Lab 09/07/23 1552 09/07/23 1939 09/07/23 2320 09/08/23 0402 09/08/23 0749  GLUCAP 109* 119* 98 106* 97    Microbiology: Results for orders placed or performed during the hospital encounter of 09/05/23  MRSA Next Gen by PCR, Nasal     Status: None   Collection Time: 09/05/23  8:25 PM   Specimen: Nasal Mucosa; Nasal Swab  Result Value Ref Range Status   MRSA by PCR Next Gen NOT DETECTED NOT DETECTED Final    Comment: (NOTE) The GeneXpert MRSA Assay (FDA approved for NASAL specimens only), is one component of a comprehensive MRSA colonization surveillance program. It is not intended to diagnose MRSA infection nor to guide or monitor treatment for MRSA infections. Test performance is not FDA approved in patients less than 69 years old. Performed at [facility], [address] [place], [place] [address]   Culture, blood (Routine X 2) w Reflex to ID Panel     Status: None (Preliminary result)    Collection Time: 09/06/23  1:12 AM   Specimen: BLOOD  Result Value Ref Range Status   Specimen Description BLOOD BLOOD LEFT ARM  Final   Special Requests   Final    BOTTLES DRAWN AEROBIC AND ANAEROBIC Blood Culture adequate volume   Culture   Final    NO GROWTH 2 DAYS Performed at [facility], [address] [place], [place] [address]    Report Status PENDING  Incomplete  Culture, blood (Routine X 2) w Reflex to ID Panel     Status: None (Preliminary result)   Collection Time: 09/06/23  1:12 AM   Specimen: BLOOD  Result Value Ref Range Status   Specimen Description BLOOD BLOOD LEFT ARM  Final   Special Requests Blood Culture adequate volume  Final   Culture   Final    NO GROWTH 2 DAYS Performed at [facility], [address], [place], [place] [address]    Report Status PENDING  Incomplete    Coagulation Studies: No results for input(s): LABPROT, INR in the last 72 hours.  Urinalysis: Recent Labs    09/05/23 1905  COLORURINE YELLOW*  LABSPEC 1.020  PHURINE 6.0  GLUCOSEU 150*  HGBUR SMALL*  BILIRUBINUR NEGATIVE  KETONESUR 80*  PROTEINUR 30*  NITRITE NEGATIVE  LEUKOCYTESUR NEGATIVE      Imaging: No results found.    Medications:    cefTRIAXone  (ROCEPHIN )  IV Stopped (09/07/23 1215)   doxycycline (VIBRAMYCIN) IV Stopped (09/08/23 0155)   potassium PHOSPHATE IVPB (in mmol) 30 mmol (09/08/23 0952)   sodium chloride  (hypertonic) 30 mL/hr at 09/08/23 0800   thiamine  (VITAMIN B1) injection 500 mg (09/08/23 0951)    Chlorhexidine  Gluconate Cloth  6 each Topical Daily   enoxaparin (LOVENOX) injection  40 mg Subcutaneous Q24H   folic acid   1 mg Oral Daily   multivitamin with minerals  1 tablet Oral Daily   PHENObarbital  65 mg Intravenous Q8H   Followed by   [START ON 09/10/2023] PHENObarbital  32.5 mg Intravenous Q8H   [START  ON 09/10/2023] thiamine  (VITAMIN B1) injection  100 mg Intravenous Daily   chlorpheniramine-HYDROcodone, docusate  sodium, LORazepam , mouth rinse, polyethylene glycol  Assessment/ Plan:  [name] is a 68 y.o.  male with past medical conditions including hypertension, alcohol and tobacco abuse, and hyponatremia, who was admitted to [facility] on 09/05/2023 for Hyponatremia [E87.1] Acute metabolic encephalopathy [G93.41]    Severe hyponatremia, patient has history of chronic hyponatremia.  Sodium on ED arrival 104.  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  Patient started on 3% hypertonic saline yesterday at 25 mL/h.  Sodium has increased to 128 today.  Primary team has increased hypertonic saline to 30 mL/h, we agree with his increase and we will continue to monitor sodium levels.  Optimal goal of increase will be 8-9 points per 24 hours.     LOS: 3 Cardelia Sassano 6/12/202510:39 AM

## 2023-09-08 NOTE — Progress Notes (Signed)
 Nutrition Follow Up Note   DOCUMENTATION CODES:   Non-severe (moderate) malnutrition in context of social or environmental circumstances  INTERVENTION:   Ensure Plus High Protein po TID, each supplement provides 350 kcal and 20 grams of protein.  MVI, folic acid  and thiamine  po daily   Vitamin C 500mg  po BID   Pt at high refeed risk; recommend monitor potassium, magnesium  and phosphorus labs daily until stable  Daily weights   NUTRITION DIAGNOSIS:   Moderate Malnutrition related to social / environmental circumstances as evidenced by moderate fat depletion, moderate muscle depletion. -ongoing   GOAL:   Patient will meet greater than or equal to 90% of their needs -not met   MONITOR:   PO intake, Supplement acceptance, Labs, Weight trends, I & O's, Skin  ASSESSMENT:   68 y/o male with h/o etoh abuse, tobacco use, hypertension, chronic hyponatremia and recurrent falls who is admitted with falls, rhabdomyolsis, new Afib, hyponatremia, dehydration and alcoholic hepatitis.  Visited pt's room today. Pt is more alert. Pt seen by SLP today and was initiated on a mechanical soft diet. Pt is able to feed himself. RD will add supplements and vitamins to help pt meet his estimated needs. Pt is at high refeed risk. Per chart, pt is up ~5lbs since admission. Pt +634.1ml on his I & Os.   Medications reviewed and include: lovenox, folic acid , MVI, thiamine , ceftriaxone , doxycycline, Kphos, hypertonic saline  Labs reviewed: Na 122(H), K 3.3(L), BUN <5(L), creat 0.49(L), P 2.1(L), Mg 2.0 wnl Cbgs- 97, 106 x 24 hrs  Urine osm- 560, urine sodium 19- 6/11  UOP-   Diet Order:   Diet Order             DIET DYS 3 Room service appropriate? No; Fluid consistency: Thin  Diet effective now                  EDUCATION NEEDS:   Education needs have been addressed  Skin:  Skin Assessment: Reviewed RN Assessment (right eye bruise, right orbital frontal hematoma, right arm and  thigh bruising)  Last BM:  6/9  Height:   Ht Readings from Last 1 Encounters:  09/05/23 6' (1.829 m)    Weight:   Wt Readings from Last 1 Encounters:  09/08/23 82.1 kg    Ideal Body Weight:  80.9 kg  BMI:  Body mass index is 24.55 kg/m.  Estimated Nutritional Needs:   Kcal:  2100-2400kcal/day  Protein:  105-120g/day  Fluid:  2.1-2.4L/day  Torrance Freestone MS, RD, LDN If unable to be reached, please send secure chat to RD inpatient available from 8:00a-4:00p daily

## 2023-09-08 NOTE — Consult Note (Signed)
 PHARMACY CONSULT NOTE - Hypertonic Saline  Pharmacy Consult for Hypertonic Saline Monitoring and Management  Recent Labs: Potassium (mmol/L)  Date Value  09/08/2023 3.3 (L)  03/24/2013 4.1   Magnesium  (mg/dL)  Date Value  16/12/9602 2.0   Calcium (mg/dL)  Date Value  54/11/8117 7.5 (L)   Calcium, Total (mg/dL)  Date Value  14/78/2956 8.8   Albumin (g/dL)  Date Value  21/30/8657 2.8 (L)  03/24/2013 3.6   Phosphorus (mg/dL)  Date Value  84/69/6295 2.1 (L)   Sodium (mmol/L)  Date Value  09/08/2023 123 (L)  03/24/2013 136    Assessment  Barry Fowler is a 68 y.o. male presenting with altered mental status. PMH significant for tobacco use, hypertension, chronic hyponatremia. Pharmacy has been consulted to monitor hypertonic saline (3%) infusion.  Pertinent medications: N/A  Baseline Labs: Serum Na 115, Serum Osm 239, Urine Na pending, Urine Osm pending  Goal of Therapy:  Increase in Na by 4-6 mEq/L in 4-6 hours Do not exceed increase in Na by 10-12 mEq/L in 24 hours  Monitoring:  Date Time Na Rate/Comment  06/11 1140 116 Baseline(HTS@25ml /hr started on 1220) 06/11 1547 118 06/11 2244 119 06/12 0318 120 Rate change@0714  to 27ml/hr 06/12 0824 122  06/12 1220 123   Plan:  Continue 3% saline at 30 mL/hr(increase of 7 over past 24 hours) Check Na Q4H  Stop infusion if  Na increases > 4 mEq/L in first 2 hours Na increases > 6 mEq/L in first 4 hours Na increases > 6 mEq/L in 6 hours  Na increases > 8 mEq/L in 8 hours (give D5W bolus) Continue to monitor for signs of clinical improvement and recommendations per nephrology  Anselma Herbel A Marianna Cid, PharmD Clinical Pharmacist 09/08/2023 3:24 PM

## 2023-09-09 ENCOUNTER — Inpatient Hospital Stay

## 2023-09-09 DIAGNOSIS — G9341 Metabolic encephalopathy: Secondary | ICD-10-CM | POA: Diagnosis not present

## 2023-09-09 LAB — GLUCOSE, CAPILLARY
Glucose-Capillary: 129 mg/dL — ABNORMAL HIGH (ref 70–99)
Glucose-Capillary: 128 mg/dL — ABNORMAL HIGH (ref 70–99)
Glucose-Capillary: 127 mg/dL — ABNORMAL HIGH (ref 70–99)
Glucose-Capillary: 146 mg/dL — ABNORMAL HIGH (ref 70–99)

## 2023-09-09 LAB — BASIC METABOLIC PANEL WITH GFR
Anion gap: 5 (ref 5–15)
BUN: <5 mg/dL — ABNORMAL LOW (ref 8–23)
CO2: 28 mmol/L (ref 22–32)
Calcium: 7.5 mg/dL — ABNORMAL LOW (ref 8.9–10.3)
Chloride: 92 mmol/L — ABNORMAL LOW (ref 98–111)
Creatinine, Ser: 0.44 mg/dL — ABNORMAL LOW (ref 0.61–1.24)
GFR, Estimated: >60 mL/min (ref >60)
Glucose, Bld: 123 mg/dL — ABNORMAL HIGH (ref 70–99)
Potassium: 3.2 mmol/L — ABNORMAL LOW (ref 3.5–5.1)
Sodium: 125 mmol/L — ABNORMAL LOW (ref 135–145)

## 2023-09-09 LAB — SODIUM
Sodium: 124 mmol/L — ABNORMAL LOW (ref 135–145)
Sodium: 126 mmol/L — ABNORMAL LOW (ref 135–145)
Sodium: 127 mmol/L — ABNORMAL LOW (ref 135–145)
Sodium: 127 mmol/L — ABNORMAL LOW (ref 135–145)
Sodium: 127 mmol/L — ABNORMAL LOW (ref 135–145)

## 2023-09-09 LAB — PHOSPHORUS: Phosphorus: 2.8 mg/dL (ref 2.5–4.6)

## 2023-09-09 LAB — MAGNESIUM: Magnesium: 1.7 mg/dL (ref 1.7–2.4)

## 2023-09-09 MED ORDER — THIAMINE MONONITRATE 100 MG PO TABS
100.0000 mg | ORAL_TABLET | Freq: Every day | ORAL | Status: DC
  Administered 2023-09-10 – 2023-09-13 (×4): 100 mg via ORAL
  Filled 2023-09-09 (×4): qty 1

## 2023-09-09 MED ORDER — GUAIFENESIN ER 600 MG PO TB12
600.0000 mg | ORAL_TABLET | Freq: Two times a day (BID) | ORAL | Status: DC
  Administered 2023-09-09 – 2023-09-13 (×9): 600 mg via ORAL
  Filled 2023-09-09 (×9): qty 1

## 2023-09-09 MED ORDER — ORAL CARE MOUTH RINSE
15.0000 mL | OROMUCOSAL | Status: DC
  Administered 2023-09-09 – 2023-09-13 (×13): 15 mL via OROMUCOSAL

## 2023-09-09 MED ORDER — THIAMINE HCL 100 MG/ML IJ SOLN
500.0000 mg | Freq: Once | INTRAVENOUS | Status: AC
  Administered 2023-09-09: 500 mg via INTRAVENOUS
  Filled 2023-09-09: qty 5

## 2023-09-09 MED ORDER — MAGNESIUM SULFATE 2 GM/50ML IV SOLN
2.0000 g | Freq: Once | INTRAVENOUS | Status: AC
  Administered 2023-09-09: 2 g via INTRAVENOUS
  Filled 2023-09-09: qty 50

## 2023-09-09 MED ORDER — PANTOPRAZOLE SODIUM 40 MG PO TBEC
40.0000 mg | DELAYED_RELEASE_TABLET | Freq: Every day | ORAL | Status: DC
  Administered 2023-09-09 – 2023-09-13 (×5): 40 mg via ORAL
  Filled 2023-09-09 (×5): qty 1

## 2023-09-09 MED ORDER — ALUM & MAG HYDROXIDE-SIMETH 200-200-20 MG/5ML PO SUSP
30.0000 mL | ORAL | Status: DC | PRN
  Administered 2023-09-09: 30 mL via ORAL
  Filled 2023-09-09: qty 30

## 2023-09-09 MED ORDER — POTASSIUM CHLORIDE 20 MEQ PO PACK
40.0000 meq | PACK | ORAL | Status: AC
  Administered 2023-09-09 (×2): 40 meq via ORAL
  Filled 2023-09-09 (×2): qty 2

## 2023-09-09 NOTE — TOC Initial Note (Signed)
 Transition of Care [facility]) - Initial/Assessment Note    Patient Details  Name: Barry Fowler MRN: [id number] Date of Birth: 06-10-1955  Transition of Care [facility]) CM/SW Contact:    Barry Fowler A Barry Savary, RN Phone Number: 09/09/2023, 2:56 PM  Clinical Narrative:                 Chart reviewed.  Noted that patient was admitted with Acute Metabolic encephalopathy.  Noted that patient was found to have severe hyponatremia.    I have meet with patient at bedside.  He reports that prior to admission he lived at home by himself.  He reports that he uses a walker to get around with at his home.  He informs me that he is able to bath and dress himself.  He reports that he goes to a local clinic to receive medical care and prescriptions medications. Barry Fowler reports that his sister takes him to medical appointment.    Barry Fowler informs me that he drinks about two 40 oz beers daily.  He reports that the residents that live in the apartment supply the beer.    Barry Fowler was agreeable for me to complete a bed search to the [place] area.  He reports that he has been to [facility] before.  He reports that it is ok to speak with his sister Barry Fowler.    I have spoken to Barry Fowler.  Barry Fowler informs me that patient is legally blind.  She reports that she would take patient to medical appointments.  She reports that Barry Fowler lives by himself in the [facility] [facility] apartments.  She reports that Barry Fowler would receive the beer from residents in the apartment complex he lived in.  Barry Fowler reports that patient has been to [facility].  I have made Barry Fowler aware that PT has recommended short term rehab on discharge.  I have explained the SNF placement process to Barry Fowler.  She and Barry Fowler did not have SNF preference.    TOC will continue to follow for discharge planning.     Expected Discharge Plan: Skilled Nursing Facility Barriers to Discharge: Continued Medical Work up   Patient Goals and CMS Choice   CMS Medicare.gov  Compare Post Acute Care list provided to:: Patient        Expected Discharge Plan and Services   Discharge Planning Services: CM Consult Post Acute Care Choice: Skilled Nursing Facility Living arrangements for the past 2 months: Apartment                                      Prior Living Arrangements/Services Living arrangements for the past 2 months: Apartment Lives with:: Self Patient language and need for interpreter reviewed:: Yes        Need for Family Participation in Patient Care: Yes (Comment) (Patient has a supportive sister Barry Fowler)   Current home services: DME (Patient has a walker with wheels)    Activities of Daily Living   ADL Screening (condition at time of admission) Independently performs ADLs?: Yes (appropriate for developmental age) Is the patient deaf or have difficulty hearing?: No Does the patient have difficulty seeing, even when wearing glasses/contacts?: No Does the patient have difficulty concentrating, remembering, or making decisions?: No  Permission Sought/Granted                  Emotional Assessment Appearance:: Appears stated age Attitude/Demeanor/Rapport: Other (  comment) (Appropriate) Affect (typically observed): Appropriate Orientation: : Oriented to Self, Oriented to Place, Oriented to Situation Alcohol / Substance Use: Alcohol Use (Patient reports that he uses 2  40 oz bottles of beer daily)    Admission diagnosis:  Hyponatremia [E87.1] Acute metabolic encephalopathy [G93.41] Patient Active Problem List   Diagnosis Date Noted   Malnutrition of moderate degree 09/07/2023   Acute metabolic encephalopathy 09/05/2023   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   Hypokalemia 12/03/2020   Abnormal LFTs (liver function tests)    Melena    Hyponatremia 01/11/2020   Alcohol abuse 01/11/2020   PCP:  Patient, No Pcp Per Pharmacy:   [facility] [address], [place] - [address] [place] [address] Phone:  [phone] Fax: [phone]  [facility] - [place], [place] - [address] [place] [address] Phone: [phone] Fax: [phone]     Social Drivers of Health (SDOH) Social History: SDOH Screenings   Food Insecurity: No Food Insecurity (09/06/2023)  Housing: Low Risk  (09/06/2023)  Transportation Needs: No Transportation Needs (09/06/2023)  Utilities: Not At Risk (09/06/2023)  Social Connections: Unknown (09/06/2023)  Tobacco Use: High Risk (09/05/2023)   SDOH Interventions:     Readmission Risk Interventions     No data to display

## 2023-09-09 NOTE — NC FL2 (Signed)
 [place]  MEDICAID FL2 LEVEL OF CARE FORM     IDENTIFICATION  Patient Name: Barry Fowler Birthdate: 13-Aug-1955 Sex: male Admission Date (Current Location): 09/05/2023  [facility] and [place] Number:  Chiropodist and Address:  [facility], [address] [place], [place] [address]      Provider Number: [id number]  Attending Physician Name and Address:  Tiajuana Fluke, MD  Relative Name and Phone Number:  Pincus Bridgeman [phone]    Current Level of Care: Hospital Recommended Level of Care: Skilled Nursing Facility Prior Approval Number:    Date Approved/Denied:   PASRR Number: [id number]  Discharge Plan: SNF    Current Diagnoses: Patient Active Problem List   Diagnosis Date Noted   Malnutrition of moderate degree 09/07/2023   Acute metabolic encephalopathy 09/05/2023   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   Hypokalemia 12/03/2020   Abnormal LFTs (liver function tests)    Melena    Hyponatremia 01/11/2020   Alcohol abuse 01/11/2020    Orientation RESPIRATION BLADDER Height & Weight     Self  Normal Incontinent Weight: 76.1 kg Height:  6' (182.9 cm)  BEHAVIORAL SYMPTOMS/MOOD NEUROLOGICAL BOWEL NUTRITION STATUS      Incontinent  (See Discharge Summary)  AMBULATORY STATUS COMMUNICATION OF NEEDS Skin   Extensive Assist Verbally Bruising (Right eye)                       Personal Care Assistance Level of Assistance  Bathing, Feeding, Dressing Bathing Assistance: Maximum assistance Feeding assistance: Limited assistance Dressing Assistance: Maximum assistance     Functional Limitations Info  Sight, Hearing, Speech Sight Info: Impaired (Patient is legally blind) Hearing Info: Adequate Speech Info: Adequate    SPECIAL CARE FACTORS FREQUENCY  PT (By licensed PT), OT (By licensed OT)     PT Frequency: 5x weekly OT Frequency: 5x weekly            Contractures Contractures Info: Not present    Additional Factors Info   Code Status, Allergies Code Status Info: Full Code Allergies Info: No Known Allergies           Current Medications (09/09/2023):  This is the current hospital active medication list Current Facility-Administered Medications  Medication Dose Route Frequency Provider Last Rate Last Admin   alum & mag hydroxide-simeth (MAALOX/MYLANTA) 200-200-20 MG/5ML suspension 30 mL  30 mL Oral Q4H PRN Mosetta Areola, Sudheer B, MD   30 mL at 09/09/23 1218   ascorbic acid  (VITAMIN C ) tablet 500 mg  500 mg Oral BID Sreenath, Sudheer B, MD   500 mg at 09/09/23 1117   cefTRIAXone  (ROCEPHIN ) 1 g in sodium chloride  0.9 % 100 mL IVPB  1 g Intravenous Q24H Nazari, Walid A, RPH 200 mL/hr at 09/09/23 1234 1 g at 09/09/23 1234   Chlorhexidine  Gluconate Cloth 2 % PADS 6 each  6 each Topical Daily Margery Sheets B, MD   6 each at 09/08/23 2010   chlorpheniramine-HYDROcodone (TUSSIONEX) 10-8 MG/5ML suspension 5 mL  5 mL Oral Q12H PRN Gideon Kussmaul, NP   5 mL at 09/08/23 2353   docusate sodium  (COLACE) capsule 100 mg  100 mg Oral BID PRN Gideon Kussmaul, NP       doxycycline  (VIBRAMYCIN ) 100 mg in sodium chloride  0.9 % 250 mL IVPB  100 mg Intravenous Q12H Nazari, Walid A, RPH 125 mL/hr at 09/09/23 1321 100 mg at 09/09/23 1321   enoxaparin  (LOVENOX ) injection 40 mg  40 mg Subcutaneous Q24H  Annitta Kindler, MD   40 mg at 09/08/23 1313   feeding supplement (ENSURE PLUS HIGH PROTEIN) liquid 237 mL  237 mL Oral TID BM Margery Sheets B, MD   237 mL at 09/08/23 2010   folic acid  (FOLVITE ) tablet 1 mg  1 mg Oral Daily Gideon Kussmaul, NP   1 mg at 09/09/23 1117   guaiFENesin  (MUCINEX ) 12 hr tablet 600 mg  600 mg Oral BID Sreenath, Sudheer B, MD   600 mg at 09/09/23 1117   LORazepam  (ATIVAN ) injection 1-2 mg  1-2 mg Intravenous Q1H PRN Gideon Kussmaul, NP       multivitamin with minerals tablet 1 tablet  1 tablet Oral Daily Gideon Kussmaul, NP   1 tablet at 09/09/23 1217   Oral care  mouth rinse  15 mL Mouth Rinse PRN Assaker, Marianne Shirts, MD       pantoprazole  (PROTONIX ) EC tablet 40 mg  40 mg Oral Daily Sreenath, Sudheer B, MD   40 mg at 09/09/23 1218   PHENObarbital  (LUMINAL) injection 65 mg  65 mg Intravenous Q8H Assaker, Marianne Shirts, MD   65 mg at 09/09/23 1117   Followed by   Cecily Cohen ON 09/10/2023] PHENObarbital  (LUMINAL) injection 32.5 mg  32.5 mg Intravenous Q8H Assaker, Jean-Pierre, MD       polyethylene glycol (MIRALAX  / GLYCOLAX ) packet 17 g  17 g Oral Daily PRN Gideon Kussmaul, NP       potassium chloride  (KLOR-CON ) packet 40 mEq  40 mEq Oral Q4H Nazari, Walid A, RPH   40 mEq at 09/09/23 1116   sodium chloride  (hypertonic) 3 % solution   Intravenous Continuous Rust-Chester, Britton L, NP 30 mL/hr at 09/09/23 0200 Infusion Verify at 09/09/23 0200   thiamine  (VITAMIN B1) 500 mg in sodium chloride  0.9 % 50 mL IVPB  500 mg Intravenous Once Nazari, Walid A, RPH       [START ON 09/10/2023] thiamine  (VITAMIN B1) tablet 100 mg  100 mg Oral Daily Nazari, Walid A, [facility]         Discharge Medications: Please see discharge summary for a list of discharge medications.  Relevant Imaging Results:  Relevant Lab Results:   Additional Information [id number]  Lazarius Rivkin A Xayvier Vallez, RN

## 2023-09-09 NOTE — Consult Note (Signed)
 PHARMACY CONSULT NOTE - ELECTROLYTES  Pharmacy Consult for Electrolyte Monitoring and Replacement   Recent Labs: Height: 6' (182.9 cm) Weight: 76.1 kg (167 lb 12.3 oz) IBW/kg (Calculated) : 77.6 Estimated Creatinine Clearance: 97.8 mL/min (A) (by C-G formula based on SCr of 0.44 mg/dL (L)). Potassium (mmol/L)  Date Value  09/09/2023 3.2 (L)  03/24/2013 4.1   Magnesium  (mg/dL)  Date Value  95/62/1308 1.7   Calcium  (mg/dL)  Date Value  65/78/4696 7.5 (L)   Calcium , Total (mg/dL)  Date Value  29/52/8413 8.8   Albumin (g/dL)  Date Value  24/40/1027 2.8 (L)  03/24/2013 3.6   Phosphorus (mg/dL)  Date Value  25/36/6440 2.8   Sodium (mmol/L)  Date Value  09/09/2023 126 (L)  03/24/2013 136    Assessment  Barry Fowler is a 68 y.o. male presenting with altered mental status. PMH significant for tobacco use, hypertension, chronic hyponatremia. Pharmacy has been consulted to monitor and replace electrolytes.  Diet: NPO MIVF: 3% sodium chloride (hypertonic) @ 30 mL/hr Pertinent medications: N/A  Goal of Therapy: Electrolytes WNL  Plan:  K 3.2: Kcl 40mEq PO x 2 Mag 1.7: Mag sulfate 2g IV x 1 Check BMP, Mg, Phos with AM labs  Thank you for allowing pharmacy to be a part of this patient's care.  Coretta Dexter, PharmD, [facility] 09/09/2023 8:56 AM

## 2023-09-09 NOTE — Progress Notes (Signed)
 [facility]  ROUNDING NOTE   Subjective:   Patient overall feeling better. Still on 3% saline. Serum sodium up to 127.   Objective:  Vital signs in last 24 hours:  Temp:  [97.5 F (36.4 C)-98.1 F (36.7 C)] 97.9 F (36.6 C) (06/13 0800) Pulse Rate:  [62-88] 62 (06/13 0800) Resp:  [14-22] 15 (06/13 0800) BP: (84-125)/(53-81) 96/68 (06/13 0800) SpO2:  [91 %-100 %] 100 % (06/13 0800) Weight:  [76.1 kg] 76.1 kg (06/13 0500)  Weight change: -6 kg Filed Weights   09/07/23 0500 09/08/23 0430 09/09/23 0500  Weight: 83.5 kg 82.1 kg 76.1 kg    Intake/Output: I/O last 3 completed shifts: In: 2590 [P.O.:240; I.V.:915.1; IV Piggyback:1434.9] Out: 1651 [Urine:1650; Stool:1]   Intake/Output this shift:  Total I/O In: 220 [P.O.:220] Out: -   Physical Exam: General: NAD  Head: Bruise on right forehead, moist mucosal membranes  Eyes: Right periorbital ecchymosis    Neck: Supple  Lungs:  Clear to auscultation  Heart: Regular rate and rhythm  Abdomen:  Soft, nontender  Extremities: No peripheral edema.  Neurologic: Awake, alert, conversant  Skin: Warm,dry, Generalized bruising        Basic Metabolic Panel: Recent Labs  Lab 09/05/23 2025 09/06/23 0112 09/06/23 0321 09/06/23 0705 09/07/23 1610 09/07/23 0809 09/08/23 9604 09/08/23 5409 09/08/23 2000 09/09/23 0028 09/09/23 0422 09/09/23 0742 09/09/23 1209  NA 107* 109* 110*   < > 115*   < > 120*   < > 125* 124* 125* 126* 127*  K  --  3.4* 3.3*  --  3.3*  --  3.3*  --   --   --  3.2*  --   --   CL  --  74* 76*  --  82*  --  86*  --   --   --  92*  --   --   CO2  --  19* 20*  --  25  --  23  --   --   --  28  --   --   GLUCOSE  --  75 75  --  90  --  94  --   --   --  123*  --   --   BUN  --  6* 7*  --  <5*  --  <5*  --   --   --  <5*  --   --   CREATININE  --  0.62 0.53*  --  0.50*  --  0.49*  --   --   --  0.44*  --   --   CALCIUM   --  7.8* 7.5*  --  7.5*  --  7.5*  --   --   --  7.5*  --   --   MG  1.8  --  1.9  --  1.9  --  2.0  --   --   --  1.7  --   --   PHOS  --   --  2.0*  --  2.0*  --  2.1*  --   --   --  2.8  --   --    < > = values in this interval not displayed.    Liver Function Tests: Recent Labs  Lab 09/05/23 1755  AST 123*  ALT 42  ALKPHOS 48  BILITOT 1.9*  PROT 6.0*  ALBUMIN 2.8*   No results for input(s): LIPASE, AMYLASE in the last 168 hours. No results for  input(s): AMMONIA in the last 168 hours.  CBC: Recent Labs  Lab 09/05/23 1755 09/06/23 0321 09/07/23 0337 09/08/23 0318  WBC 13.9* 13.3* 10.3 9.7  NEUTROABS 10.5*  --   --  6.9  HGB 12.1* 11.6* 10.3* 11.5*  HCT RESULTS UNAVAILABLE DUE TO INTERFERING SUBSTANCE RESULTS UNAVAILABLE DUE TO INTERFERING SUBSTANCE 27.7* 31.9*  MCV RESULTS UNAVAILABLE DUE TO INTERFERING SUBSTANCE RESULTS UNAVAILABLE DUE TO INTERFERING SUBSTANCE 82.4 84.8  PLT 290 313 291 321    Cardiac Enzymes: Recent Labs  Lab 09/05/23 1755  CKTOTAL 3,815*    BNP: Invalid input(s): POCBNP  CBG: Recent Labs  Lab 09/08/23 1701 09/08/23 1927 09/08/23 2322 09/09/23 0722 09/09/23 1254  GLUCAP 112* 134* 140* 129* 128*    Microbiology: Results for orders placed or performed during the hospital encounter of 09/05/23  MRSA Next Gen by PCR, Nasal     Status: None   Collection Time: 09/05/23  8:25 PM   Specimen: Nasal Mucosa; Nasal Swab  Result Value Ref Range Status   MRSA by PCR Next Gen NOT DETECTED NOT DETECTED Final    Comment: (NOTE) The GeneXpert MRSA Assay (FDA approved for NASAL specimens only), is one component of a comprehensive MRSA colonization surveillance program. It is not intended to diagnose MRSA infection nor to guide or monitor treatment for MRSA infections. Test performance is not FDA approved in patients less than 78 years old. Performed at [facility], [address] [place], [place] [address]   Culture, blood (Routine X 2) w Reflex to ID Panel     Status: None (Preliminary  result)   Collection Time: 09/06/23  1:12 AM   Specimen: BLOOD  Result Value Ref Range Status   Specimen Description BLOOD BLOOD LEFT ARM  Final   Special Requests   Final    BOTTLES DRAWN AEROBIC AND ANAEROBIC Blood Culture adequate volume   Culture   Final    NO GROWTH 3 DAYS Performed at [facility], [address], [place], [place] [address]    Report Status PENDING  Incomplete  Culture, blood (Routine X 2) w Reflex to ID Panel     Status: None (Preliminary result)   Collection Time: 09/06/23  1:12 AM   Specimen: BLOOD  Result Value Ref Range Status   Specimen Description BLOOD BLOOD LEFT ARM  Final   Special Requests Blood Culture adequate volume  Final   Culture   Final    NO GROWTH 3 DAYS Performed at [facility], [address], [place], [place] [address]    Report Status PENDING  Incomplete    Coagulation Studies: No results for input(s): LABPROT, INR in the last 72 hours.  Urinalysis: No results for input(s): COLORURINE, LABSPEC, PHURINE, GLUCOSEU, HGBUR, BILIRUBINUR, KETONESUR, PROTEINUR, UROBILINOGEN, NITRITE, LEUKOCYTESUR in the last 72 hours.  Invalid input(s): APPERANCEUR     Imaging: DG Chest Port 1 View Result Date: 09/09/2023 CLINICAL DATA:  Cough EXAM: PORTABLE CHEST 1 VIEW COMPARISON:  Chest radiograph dated 09/05/2023 FINDINGS: Normal lung volumes. Increased mild patchy right basilar and left upper lung opacities and dense left retrocardiac opacity. Increased small left pleural effusion. Similar trace right pleural effusion. No pneumothorax. Similar enlarged cardiomediastinal silhouette. No acute osseous abnormality. Round metallic BB is again seen projecting over the neck. IMPRESSION: 1. Increased mild patchy right basilar and left upper lung opacities and dense left retrocardiac opacity, which may represent atelectasis or pneumonia. 2. Increased small left pleural effusion. Similar trace right pleural  effusion. Electronically  Signed   By: Limin  Xu M.D.   On: 09/09/2023 13:20      Medications:    cefTRIAXone  (ROCEPHIN )  IV 1 g (09/09/23 1234)   doxycycline  (VIBRAMYCIN ) IV 100 mg (09/09/23 1321)   sodium chloride  (hypertonic) 30 mL/hr at 09/09/23 0200   thiamine  (VITAMIN B1) injection      vitamin C   500 mg Oral BID   Chlorhexidine  Gluconate Cloth  6 each Topical Daily   enoxaparin  (LOVENOX ) injection  40 mg Subcutaneous Q24H   feeding supplement  237 mL Oral TID BM   folic acid   1 mg Oral Daily   guaiFENesin   600 mg Oral BID   multivitamin with minerals  1 tablet Oral Daily   pantoprazole   40 mg Oral Daily   PHENObarbital   65 mg Intravenous Q8H   Followed by   Cecily Cohen ON 09/10/2023] PHENObarbital   32.5 mg Intravenous Q8H   potassium chloride   40 mEq Oral Q4H   [START ON 09/10/2023] thiamine   100 mg Oral Daily   alum & mag hydroxide-simeth, chlorpheniramine-HYDROcodone, docusate sodium , LORazepam , mouth rinse, polyethylene glycol  Assessment/ Plan:  [name] is a 68 y.o.  male with past medical conditions including hypertension, alcohol and tobacco abuse, and hyponatremia, who was admitted to [facility] on 09/05/2023 for Hyponatremia [E87.1] Acute metabolic encephalopathy [G93.41]    Severe hyponatremia, patient has history of chronic hyponatremia.  Sodium on ED arrival 104.  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  Serum sodium continues to improve appropriately.  Serum sodium up to 127 at the moment.  Continue 3% saline until serum sodium reaches 130.     LOS: 4 Eva Vallee 6/13/20253:22 PM

## 2023-09-09 NOTE — TOC Progression Note (Signed)
 Transition of Care [facility]) - Progression Note    Patient Details  Name: Barry Fowler MRN: [id number] Date of Birth: 08/07/1955  [facility]) CM/SW Contact  Falisha Osment A Morty Ortwein, RN Phone Number: 09/09/2023, 3:50 PM  Clinical Narrative:    Chart reviewed.  Fl 2 completed and faxed out to [facility] area facilities   Expected Discharge Plan: Skilled Nursing Facility Barriers to Discharge: Continued Medical Work up  Expected Discharge Plan and Services   Discharge Planning Services: CM Consult Post Acute Care Choice: Skilled Nursing Facility Living arrangements for the past 2 months: Apartment                                       Social Determinants of Health (SDOH) Interventions SDOH Screenings   Food Insecurity: No Food Insecurity (09/06/2023)  Housing: Low Risk  (09/06/2023)  Transportation Needs: No Transportation Needs (09/06/2023)  Utilities: Not At Risk (09/06/2023)  Social Connections: Unknown (09/06/2023)  Tobacco Use: High Risk (09/05/2023)    Readmission Risk Interventions     No data to display

## 2023-09-09 NOTE — Consult Note (Signed)
 PHARMACY CONSULT NOTE - Hypertonic Saline  Pharmacy Consult for Hypertonic Saline Monitoring and Management  Recent Labs: Potassium (mmol/L)  Date Value  09/09/2023 3.2 (L)  03/24/2013 4.1   Magnesium  (mg/dL)  Date Value  56/21/3086 1.7   Calcium  (mg/dL)  Date Value  57/84/6962 7.5 (L)   Calcium , Total (mg/dL)  Date Value  95/28/4132 8.8   Albumin (g/dL)  Date Value  44/03/270 2.8 (L)  03/24/2013 3.6   Phosphorus (mg/dL)  Date Value  53/66/4403 2.8   Sodium (mmol/L)  Date Value  09/09/2023 127 (L)  03/24/2013 136    Assessment  Barry Fowler is a 68 y.o. male presenting with altered mental status. PMH significant for tobacco use, hypertension, chronic hyponatremia. Pharmacy has been consulted to monitor hypertonic saline (3%) infusion.  Pertinent medications: N/A  Baseline Labs: Serum Na 115, Serum Osm 239, Urine Na pending, Urine Osm pending  Goal of Therapy:  Increase in Na by 4-6 mEq/L in 4-6 hours Do not exceed increase in Na by 10-12 mEq/L in 24 hours  Monitoring:  Date Time Na Rate/Comment  06/11 1140 116 Baseline (HTS@25ml /hr started on 1220) 06/11 1547 118 06/11 2244 119 06/12 0318 120 Rate change @ 0714 to 27ml/hr 06/12 0824 122  06/12 1220 123  06/12   1601    124 06/12 2000 125 06/13 0028 124 06/13 0422 125 06/13 0742 126 06/13 1209 127  Plan:  Continue 3% saline at 30 mL/hr (increase of 4 over past 24 hours) Check Na Q4H  Stop infusion if  Na increases > 4 mEq/L in first 2 hours Na increases > 6 mEq/L in first 4 hours Na increases > 6 mEq/L in 6 hours  Na increases > 8 mEq/L in 8 hours (give D5W bolus) Continue to monitor for signs of clinical improvement and recommendations per nephrology  Cristal Don, PharmD 09/09/2023 2:32 PM

## 2023-09-09 NOTE — Plan of Care (Signed)
   Problem: Activity: Goal: Risk for activity intolerance will decrease Outcome: Progressing   Problem: Nutrition: Goal: Adequate nutrition will be maintained Outcome: Progressing   Problem: Elimination: Goal: Will not experience complications related to bowel motility Outcome: Progressing

## 2023-09-09 NOTE — Progress Notes (Signed)
 PROGRESS NOTE    Barry Fowler  ZOX:[id number] DOB: [birth year] DOA: 09/05/2023 PCP: Patient, No Pcp Per    Brief Narrative:  68 y.o male with significant PMH of EtOH abuse, tobacco abuse, hypertension, chronic hyponatremia and recurrent falls who presented to the ED with chief complaints of fall.   Per ED reports, patient was found down by sister who called EMS.  It is unclear how long he has been down but reported he fell on Thursday and sustained multiple bruises.  Patient sister reported he has been binge drinking and she normally checks on him a few times per week. 09/05/2023 - Admitted to the ICU with severe hyponatremia and started on gentle hydration with NS.  09/06/2023 - Started on phenobarb taper. Sodium correcting adequately 6/12: Sodium continues to correct.  On 3% saline at 30 cc/h.  Remains on phenobarbital  taper. 6/13: Sodium continues to slowly correct.  Currently 127     Assessment & Plan:   Principal Problem:   Acute metabolic encephalopathy Active Problems:   Malnutrition of moderate degree  Barry Fowler is a 68 year old male patient with a past medical history of alcohol abuse, tobacco abuse, hypertension, chronic hyponatremia with sodium between 125 and 130 presenting to the ED with chief complaint of fall. He was found to have severe hyponatremia with sodium of 104. Thought to be secondary to decreased salt intake, dehydration and binge drinking with possible beer potomania.   Severe hyposmolar hyponatremia with increased urine osm and decreased urine sodium consistent with hypovolemia, some component of decreased salt intake.  Continue 3% saline Goal 8 to 9 mEq within 24 hours Appreciate nephrology follow-up  Alcohol abuse Acute alcohol withdrawal Continue phenobarbital  taper IV thiamine  500 mg x 9 total doses Followed by 100 mg p.o. daily  Possible CAP On Rocephin  and doxycycline  5-day course.  Stop date 6/14  DVT prophylaxis: SQ Lovenox  Code Status:  Full Family Communication: None Disposition Plan: Status is: Inpatient Remains inpatient appropriate because: Severe hyponatremia   Level of care: Stepdown  Consultants:  None  Procedures:  None  Antimicrobials: None   Subjective: Seen and examined.  More awake this morning.  Sitting up in bed.  Objective: Vitals:   09/09/23 0500 09/09/23 0600 09/09/23 0700 09/09/23 0800  BP:  (!) 97/54  96/68  Pulse:  66 64 62  Resp:  16 16 15   Temp:    97.9 F (36.6 C)  TempSrc:    Oral  SpO2:  98% 100% 100%  Weight: 76.1 kg     Height:        Intake/Output Summary (Last 24 hours) at 09/09/2023 1401 Last data filed at 09/09/2023 0900 Gross per 24 hour  Intake 1471.02 ml  Output 251 ml  Net 1220.02 ml   Filed Weights   09/07/23 0500 09/08/23 0430 09/09/23 0500  Weight: 83.5 kg 82.1 kg 76.1 kg    Examination:  General exam: Appears fatigued Respiratory system: Crackles.  Normal work of breathing.  Room air Cardiovascular system: S1-S2, RRR, no murmurs, no pedal edema Gastrointestinal system: Soft, NT ND, normal bowel sounds Central nervous system: Alert.  Lethargic.  Oriented x 3.  No focal deficits Extremities: Symmetric 5 x 5 power. Skin: Right periorbital ecchymoses Psychiatry: Judgement and insight appear impaired. Mood & affect flattened.     Data Reviewed: I have personally reviewed following labs and imaging studies  CBC: Recent Labs  Lab 09/05/23 1755 09/06/23 0321 09/07/23 0337 09/08/23 0318  WBC 13.9* 13.3* 10.3 9.7  NEUTROABS 10.5*  --   --  6.9  HGB 12.1* 11.6* 10.3* 11.5*  HCT RESULTS UNAVAILABLE DUE TO INTERFERING SUBSTANCE RESULTS UNAVAILABLE DUE TO INTERFERING SUBSTANCE 27.7* 31.9*  MCV RESULTS UNAVAILABLE DUE TO INTERFERING SUBSTANCE RESULTS UNAVAILABLE DUE TO INTERFERING SUBSTANCE 82.4 84.8  PLT 290 313 291 321   Basic Metabolic Panel: Recent Labs  Lab 09/05/23 2025 09/06/23 0112 09/06/23 0321 09/06/23 0705 09/07/23 0337 09/07/23 0809  09/08/23 0318 09/08/23 0824 09/08/23 2000 09/09/23 0028 09/09/23 0422 09/09/23 0742 09/09/23 1209  NA 107* 109* 110*   < > 115*   < > 120*   < > 125* 124* 125* 126* 127*  K  --  3.4* 3.3*  --  3.3*  --  3.3*  --   --   --  3.2*  --   --   CL  --  74* 76*  --  82*  --  86*  --   --   --  92*  --   --   CO2  --  19* 20*  --  25  --  23  --   --   --  28  --   --   GLUCOSE  --  75 75  --  90  --  94  --   --   --  123*  --   --   BUN  --  6* 7*  --  <5*  --  <5*  --   --   --  <5*  --   --   CREATININE  --  0.62 0.53*  --  0.50*  --  0.49*  --   --   --  0.44*  --   --   CALCIUM   --  7.8* 7.5*  --  7.5*  --  7.5*  --   --   --  7.5*  --   --   MG 1.8  --  1.9  --  1.9  --  2.0  --   --   --  1.7  --   --   PHOS  --   --  2.0*  --  2.0*  --  2.1*  --   --   --  2.8  --   --    < > = values in this interval not displayed.   GFR: Estimated Creatinine Clearance: 97.8 mL/min (A) (by C-G formula based on SCr of 0.44 mg/dL (L)). Liver Function Tests: Recent Labs  Lab 09/05/23 1755  AST 123*  ALT 42  ALKPHOS 48  BILITOT 1.9*  PROT 6.0*  ALBUMIN 2.8*   No results for input(s): LIPASE, AMYLASE in the last 168 hours. No results for input(s): AMMONIA in the last 168 hours. Coagulation Profile: No results for input(s): INR, PROTIME in the last 168 hours. Cardiac Enzymes: Recent Labs  Lab 09/05/23 1755  CKTOTAL 3,815*   BNP (last 3 results) No results for input(s): PROBNP in the last 8760 hours. HbA1C: No results for input(s): HGBA1C in the last 72 hours. CBG: Recent Labs  Lab 09/08/23 1701 09/08/23 1927 09/08/23 2322 09/09/23 0722 09/09/23 1254  GLUCAP 112* 134* 140* 129* 128*   Lipid Profile: No results for input(s): CHOL, HDL, LDLCALC, TRIG, CHOLHDL, LDLDIRECT in the last 72 hours. Thyroid Function Tests: No results for input(s): TSH, T4TOTAL, FREET4, T3FREE, THYROIDAB in the last 72 hours.  Anemia Panel: No results for input(s):  VITAMINB12, FOLATE, FERRITIN, TIBC, IRON, RETICCTPCT in the last 72 hours. Sepsis Labs: Recent Labs  Lab  09/05/23 2325 09/06/23 0347  PROCALCITON 0.25  --   LATICACIDVEN  --  1.4    Recent Results (from the past 240 hours)  MRSA Next Gen by PCR, Nasal     Status: None   Collection Time: 09/05/23  8:25 PM   Specimen: Nasal Mucosa; Nasal Swab  Result Value Ref Range Status   MRSA by PCR Next Gen NOT DETECTED NOT DETECTED Final    Comment: (NOTE) The GeneXpert MRSA Assay (FDA approved for NASAL specimens only), is one component of a comprehensive MRSA colonization surveillance program. It is not intended to diagnose MRSA infection nor to guide or monitor treatment for MRSA infections. Test performance is not FDA approved in patients less than 34 years old. Performed at [facility], [address] [place], [place] [address]   Culture, blood (Routine X 2) w Reflex to ID Panel     Status: None (Preliminary result)   Collection Time: 09/06/23  1:12 AM   Specimen: BLOOD  Result Value Ref Range Status   Specimen Description BLOOD BLOOD LEFT ARM  Final   Special Requests   Final    BOTTLES DRAWN AEROBIC AND ANAEROBIC Blood Culture adequate volume   Culture   Final    NO GROWTH 3 DAYS Performed at [facility], [address], [place], [place] [address]    Report Status PENDING  Incomplete  Culture, blood (Routine X 2) w Reflex to ID Panel     Status: None (Preliminary result)   Collection Time: 09/06/23  1:12 AM   Specimen: BLOOD  Result Value Ref Range Status   Specimen Description BLOOD BLOOD LEFT ARM  Final   Special Requests Blood Culture adequate volume  Final   Culture   Final    NO GROWTH 3 DAYS Performed at [facility], [address] [place], [place] [address]    Report Status PENDING  Incomplete         Radiology Studies: DG Chest Port 1 View Result Date: 09/09/2023 CLINICAL DATA:  Cough EXAM: PORTABLE CHEST 1 VIEW  COMPARISON:  Chest radiograph dated 09/05/2023 FINDINGS: Normal lung volumes. Increased mild patchy right basilar and left upper lung opacities and dense left retrocardiac opacity. Increased small left pleural effusion. Similar trace right pleural effusion. No pneumothorax. Similar enlarged cardiomediastinal silhouette. No acute osseous abnormality. Round metallic BB is again seen projecting over the neck. IMPRESSION: 1. Increased mild patchy right basilar and left upper lung opacities and dense left retrocardiac opacity, which may represent atelectasis or pneumonia. 2. Increased small left pleural effusion. Similar trace right pleural effusion. Electronically Signed   By: Limin  Xu M.D.   On: 09/09/2023 13:20        Scheduled Meds:  vitamin C   500 mg Oral BID   Chlorhexidine  Gluconate Cloth  6 each Topical Daily   enoxaparin  (LOVENOX ) injection  40 mg Subcutaneous Q24H   feeding supplement  237 mL Oral TID BM   folic acid   1 mg Oral Daily   guaiFENesin   600 mg Oral BID   multivitamin with minerals  1 tablet Oral Daily   pantoprazole   40 mg Oral Daily   PHENObarbital   65 mg Intravenous Q8H   Followed by   [START ON 09/10/2023] PHENObarbital   32.5 mg Intravenous Q8H   potassium chloride   40 mEq Oral Q4H   [START ON 09/10/2023] thiamine   100 mg Oral Daily   Continuous Infusions:  cefTRIAXone  (ROCEPHIN )  IV 1 g (09/09/23 1234)  doxycycline  (VIBRAMYCIN ) IV 100 mg (09/09/23 1321)   magnesium  sulfate bolus IVPB 2 g (09/09/23 1323)   sodium chloride  (hypertonic) 30 mL/hr at 09/09/23 0200   thiamine  (VITAMIN B1) injection       LOS: 4 days     Tiajuana Fluke, MD [facility]   If 7PM-7AM, please contact night-coverage  09/09/2023, 2:01 PM

## 2023-09-09 NOTE — NC FL2 (Deleted)
 [place]  MEDICAID [id number] LEVEL OF CARE FORM     IDENTIFICATION  Patient Name: Barry Fowler Birthdate: May 27, 1955 Sex: male Admission Date (Current Location): 09/05/2023  [facility] and [place] Number:  Chiropodist and Address:  [facility], [address] [place], [place] [address]      Provider Number: [id number]  Attending Physician Name and Address:  Tiajuana Fluke, MD  Relative Name and Phone Number:  Pincus Bridgeman [phone]    Current Level of Care: Hospital Recommended Level of Care: Skilled Nursing Facility Prior Approval Number:    Date Approved/Denied:   PASRR Number: [id number]  Discharge Plan: SNF    Current Diagnoses: Patient Active Problem List   Diagnosis Date Noted   Malnutrition of moderate degree 09/07/2023   Acute metabolic encephalopathy 09/05/2023   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   Hypokalemia 12/03/2020   Abnormal LFTs (liver function tests)    Melena    Hyponatremia 01/11/2020   Alcohol abuse 01/11/2020    Orientation RESPIRATION BLADDER Height & Weight     Self  Normal Incontinent Weight: 76.1 kg Height:  6' (182.9 cm)  BEHAVIORAL SYMPTOMS/MOOD NEUROLOGICAL BOWEL NUTRITION STATUS      Incontinent  (See Discharge Summary)  AMBULATORY STATUS COMMUNICATION OF NEEDS Skin   Extensive Assist Verbally Bruising (Right eye)                       Personal Care Assistance Level of Assistance  Bathing, Feeding, Dressing Bathing Assistance: Maximum assistance Feeding assistance: Limited assistance Dressing Assistance: Maximum assistance     Functional Limitations Info  Sight, Hearing, Speech Sight Info: Adequate Hearing Info: Adequate Speech Info: Adequate    SPECIAL CARE FACTORS FREQUENCY  PT (By licensed PT), OT (By licensed OT)     PT Frequency: 5x weekly OT Frequency: 5x weekly            Contractures Contractures Info: Not present    Additional Factors Info  Code Status, Allergies Code  Status Info: Full Code Allergies Info: No Known Allergies           Current Medications (09/09/2023):  This is the current hospital active medication list Current Facility-Administered Medications  Medication Dose Route Frequency Provider Last Rate Last Admin   alum & mag hydroxide-simeth (MAALOX/MYLANTA) 200-200-20 MG/5ML suspension 30 mL  30 mL Oral Q4H PRN Mosetta Areola, Sudheer B, MD   30 mL at 09/09/23 1218   ascorbic acid  (VITAMIN C ) tablet 500 mg  500 mg Oral BID Sreenath, Sudheer B, MD   500 mg at 09/09/23 1117   cefTRIAXone  (ROCEPHIN ) 1 g in sodium chloride  0.9 % 100 mL IVPB  1 g Intravenous Q24H Nazari, Walid A, RPH 200 mL/hr at 09/09/23 1234 1 g at 09/09/23 1234   Chlorhexidine  Gluconate Cloth 2 % PADS 6 each  6 each Topical Daily Margery Sheets B, MD   6 each at 09/08/23 2010   chlorpheniramine-HYDROcodone (TUSSIONEX) 10-8 MG/5ML suspension 5 mL  5 mL Oral Q12H PRN Gideon Kussmaul, NP   5 mL at 09/08/23 2353   docusate sodium  (COLACE) capsule 100 mg  100 mg Oral BID PRN Gideon Kussmaul, NP       doxycycline  (VIBRAMYCIN ) 100 mg in sodium chloride  0.9 % 250 mL IVPB  100 mg Intravenous Q12H Nazari, Walid A, RPH 125 mL/hr at 09/09/23 1321 100 mg at 09/09/23 1321   enoxaparin  (LOVENOX ) injection 40 mg  40 mg Subcutaneous Q24H Annitta Kindler, MD  40 mg at 09/08/23 1313   feeding supplement (ENSURE PLUS HIGH PROTEIN) liquid 237 mL  237 mL Oral TID BM Margery Sheets B, MD   237 mL at 09/08/23 2010   folic acid  (FOLVITE ) tablet 1 mg  1 mg Oral Daily Gideon Kussmaul, NP   1 mg at 09/09/23 1117   guaiFENesin  (MUCINEX ) 12 hr tablet 600 mg  600 mg Oral BID Sreenath, Sudheer B, MD   600 mg at 09/09/23 1117   LORazepam  (ATIVAN ) injection 1-2 mg  1-2 mg Intravenous Q1H PRN Gideon Kussmaul, NP       multivitamin with minerals tablet 1 tablet  1 tablet Oral Daily Gideon Kussmaul, NP   1 tablet at 09/09/23 1217   Oral care mouth rinse  15 mL Mouth Rinse  PRN Assaker, Marianne Shirts, MD       pantoprazole  (PROTONIX ) EC tablet 40 mg  40 mg Oral Daily Sreenath, Sudheer B, MD   40 mg at 09/09/23 1218   PHENObarbital  (LUMINAL) injection 65 mg  65 mg Intravenous Q8H Assaker, Marianne Shirts, MD   65 mg at 09/09/23 1117   Followed by   Cecily Cohen ON 09/10/2023] PHENObarbital  (LUMINAL) injection 32.5 mg  32.5 mg Intravenous Q8H Assaker, Jean-Pierre, MD       polyethylene glycol (MIRALAX  / GLYCOLAX ) packet 17 g  17 g Oral Daily PRN Gideon Kussmaul, NP       potassium chloride  (KLOR-CON ) packet 40 mEq  40 mEq Oral Q4H Nazari, Walid A, RPH   40 mEq at 09/09/23 1116   sodium chloride  (hypertonic) 3 % solution   Intravenous Continuous Rust-Chester, Britton L, NP 30 mL/hr at 09/09/23 0200 Infusion Verify at 09/09/23 0200   thiamine  (VITAMIN B1) 500 mg in sodium chloride  0.9 % 50 mL IVPB  500 mg Intravenous Once Nazari, Walid A, RPH       [START ON 09/10/2023] thiamine  (VITAMIN B1) tablet 100 mg  100 mg Oral Daily Nazari, Walid A, [facility]         Discharge Medications: Please see discharge summary for a list of discharge medications.  Relevant Imaging Results:  Relevant Lab Results:   Additional Information [id number]  Ilea Hilton A Denni France, RN

## 2023-09-09 NOTE — Progress Notes (Signed)
 Occupational Therapy Treatment Patient Details Name: Barry Fowler MRN: [id number] DOB: 1955/05/07 Today's Date: 09/09/2023   History of present illness 68 y.o male with significant PMH of EtOH abuse, tobacco abuse, hypertension, chronic hyponatremia and recurrent falls who presented to the ED with chief complaints of fall. Admitted for severe hyponatremia with sodium of 104.   OT comments  Barry Fowler was seen for OT treatment on this date. Upon arrival to room pt in bed, agreeable to tx. Pt requires CGA sup<>sit. MOD A + RW for ADL t/f ~10 ft, +2 assist for line mgmt; 1 total LOB requiring MAX A to correct. MIN A standing grooming, noted dysmetria with toothpaste/brush coordination. SpO2 95% on 1L [place], RN agreeable to trial RA. SpO2 low 90s on RA in sitting, 88% in standing, questionable pleth with low 80s with grooming tasks, resolved to low 90s at rest. Pt making good progress toward goals, will continue to follow POC. Discharge recommendation remains appropriate.        If plan is discharge home, recommend the following:  Two people to help with walking and/or transfers;Two people to help with bathing/dressing/bathroom;Help with stairs or ramp for entrance   Equipment Recommendations  BSC/3in1;Other (comment)    Recommendations for Other Services      Precautions / Restrictions Precautions Precautions: Fall Recall of Precautions/Restrictions: Intact Restrictions Weight Bearing Restrictions Per Provider Order: No       Mobility Bed Mobility Overal bed mobility: Needs Assistance Bed Mobility: Supine to Sit, Sit to Supine     Supine to sit: Contact guard Sit to supine: Contact guard assist        Transfers Overall transfer level: Needs assistance Equipment used: Rolling walker (2 wheels) Transfers: Sit to/from Stand Sit to Stand: Min assist, +2 safety/equipment                 Balance Overall balance assessment: Needs assistance Sitting-balance support: No upper  extremity supported, Feet supported Sitting balance-Leahy Scale: Fair     Standing balance support: Bilateral upper extremity supported Standing balance-Leahy Scale: Poor                             ADL either performed or assessed with clinical judgement   ADL Overall ADL's : Needs assistance/impaired                                       General ADL Comments: MOD A + RW for ADL t/f ~10 ft, +2 assist for line mgmt. MIN A standing grooming, noted dysmetria deficits with toothpaste/brush coordination.     Extremity/Trunk Assessment              Vision       Restaurant manager, fast food Communication: No apparent difficulties   Cognition Arousal: Alert Behavior During Therapy: WFL for tasks assessed/performed Cognition: No family/caregiver present to determine baseline             OT - Cognition Comments: poor insight                 Following commands: Intact        Cueing   Cueing Techniques: Verbal cues  Exercises      Shoulder Instructions       General Comments SpO2 95% on 1L [place], RN agreeable to  trial RA. SpO2 low 90s on RA in sitting, 88% in standing, questionable pleth with low 80s with grooming tasks, resolved to low 90s at rest    Pertinent Vitals/ Pain       Pain Assessment Pain Assessment: No/denies pain  Home Living                                          Prior Functioning/Environment              Frequency  Min 2X/week        Progress Toward Goals  OT Goals(current goals can now be found in the care plan section)  Progress towards OT goals: Progressing toward goals  Acute Rehab OT Goals OT Goal Formulation: With patient Time For Goal Achievement: 09/22/23 Potential to Achieve Goals: Good ADL Goals Pt Will Perform Grooming: with modified independence;standing Pt Will Perform Lower Body Dressing: with modified independence;sit to/from  stand Pt Will Transfer to Toilet: with modified independence;ambulating;regular height toilet  Plan      Co-evaluation                 AM-PAC OT 6 Clicks Daily Activity     Outcome Measure   Help from another person eating meals?: None Help from another person taking care of personal grooming?: A Little Help from another person toileting, which includes using toliet, bedpan, or urinal?: A Lot Help from another person bathing (including washing, rinsing, drying)?: A Lot Help from another person to put on and taking off regular upper body clothing?: A Little Help from another person to put on and taking off regular lower body clothing?: A Lot 6 Click Score: 16    End of Session Equipment Utilized During Treatment: Gait belt;Rolling walker (2 wheels)  OT Visit Diagnosis: Other abnormalities of gait and mobility (R26.89);Muscle weakness (generalized) (M62.81)   Activity Tolerance Patient tolerated treatment well   Patient Left in bed;with call bell/phone within reach;with bed alarm set   Nurse Communication Mobility status        Time: 1610-9604 OT Time Calculation (min): 27 min  Charges: OT General Charges $OT Visit: 1 Visit OT Treatments $Self Care/Home Management : 23-37 mins  Gordan Latina, M.S. OTR/L  09/09/23, 1:41 PM  ascom [phone]

## 2023-09-10 DIAGNOSIS — I480 Paroxysmal atrial fibrillation: Secondary | ICD-10-CM

## 2023-09-10 DIAGNOSIS — L8990 Pressure ulcer of unspecified site, unspecified stage: Secondary | ICD-10-CM | POA: Insufficient documentation

## 2023-09-10 DIAGNOSIS — G9341 Metabolic encephalopathy: Secondary | ICD-10-CM | POA: Diagnosis not present

## 2023-09-10 LAB — SODIUM
Sodium: 127 mmol/L — ABNORMAL LOW (ref 135–145)
Sodium: 129 mmol/L — ABNORMAL LOW (ref 135–145)
Sodium: 129 mmol/L — ABNORMAL LOW (ref 135–145)
Sodium: 130 mmol/L — ABNORMAL LOW (ref 135–145)
Sodium: 132 mmol/L — ABNORMAL LOW (ref 135–145)

## 2023-09-10 LAB — GLUCOSE, CAPILLARY
Glucose-Capillary: 141 mg/dL — ABNORMAL HIGH (ref 70–99)
Glucose-Capillary: 122 mg/dL — ABNORMAL HIGH (ref 70–99)
Glucose-Capillary: 135 mg/dL — ABNORMAL HIGH (ref 70–99)
Glucose-Capillary: 162 mg/dL — ABNORMAL HIGH (ref 70–99)
Glucose-Capillary: 142 mg/dL — ABNORMAL HIGH (ref 70–99)

## 2023-09-10 LAB — CBC
HCT: 28.3 % — ABNORMAL LOW (ref 39.0–52.0)
Hemoglobin: 9.9 g/dL — ABNORMAL LOW (ref 13.0–17.0)
MCH: 30.8 pg (ref 26.0–34.0)
MCHC: 35 g/dL (ref 30.0–36.0)
MCV: 88.2 fL (ref 80.0–100.0)
Platelets: 298 10*3/uL (ref 150–400)
RBC: 3.21 MIL/uL — ABNORMAL LOW (ref 4.22–5.81)
RDW: 14.4 % (ref 11.5–15.5)
WBC: 9 10*3/uL (ref 4.0–10.5)
nRBC: 0 % (ref 0.0–0.2)

## 2023-09-10 LAB — TSH: TSH: 1.259 u[IU]/mL (ref 0.350–4.500)

## 2023-09-10 LAB — POTASSIUM: Potassium: 4.2 mmol/L (ref 3.5–5.1)

## 2023-09-10 LAB — MAGNESIUM: Magnesium: 1.9 mg/dL (ref 1.7–2.4)

## 2023-09-10 LAB — HEPARIN LEVEL (UNFRACTIONATED): Heparin Unfractionated: 0.24 [IU]/mL — ABNORMAL LOW (ref 0.30–0.70)

## 2023-09-10 MED ORDER — METOPROLOL TARTRATE 25 MG PO TABS
25.0000 mg | ORAL_TABLET | Freq: Two times a day (BID) | ORAL | Status: DC
  Administered 2023-09-10 – 2023-09-13 (×7): 25 mg via ORAL
  Filled 2023-09-10 (×7): qty 1

## 2023-09-10 MED ORDER — HEPARIN BOLUS VIA INFUSION
4000.0000 [IU] | Freq: Once | INTRAVENOUS | Status: AC
  Administered 2023-09-10: 4000 [IU] via INTRAVENOUS
  Filled 2023-09-10: qty 4000

## 2023-09-10 MED ORDER — SODIUM CHLORIDE 1 G PO TABS
1.0000 g | ORAL_TABLET | Freq: Three times a day (TID) | ORAL | Status: DC
  Administered 2023-09-10 – 2023-09-13 (×10): 1 g via ORAL
  Filled 2023-09-10 (×12): qty 1

## 2023-09-10 MED ORDER — HEPARIN (PORCINE) 25000 UT/250ML-% IV SOLN
1650.0000 [IU]/h | INTRAVENOUS | Status: DC
  Administered 2023-09-10: 1000 [IU]/h via INTRAVENOUS
  Administered 2023-09-11: 1300 [IU]/h via INTRAVENOUS
  Administered 2023-09-12 (×2): 1550 [IU]/h via INTRAVENOUS
  Administered 2023-09-13: 1650 [IU]/h via INTRAVENOUS
  Filled 2023-09-10 (×5): qty 250

## 2023-09-10 MED ORDER — HEPARIN BOLUS VIA INFUSION
1000.0000 [IU] | Freq: Once | INTRAVENOUS | Status: AC
  Administered 2023-09-11: 1000 [IU] via INTRAVENOUS
  Filled 2023-09-10: qty 1000

## 2023-09-10 NOTE — Progress Notes (Signed)
 [facility]  ROUNDING NOTE   Subjective:   Discontinued hypertonic saline infusion.  Na 132  Patient alert this morning.    Objective:  Vital signs in last 24 hours:  Temp:  [97.8 F (36.6 C)-98.2 F (36.8 C)] 97.8 F (36.6 C) (06/14 0900) Pulse Rate:  [68-158] 126 (06/14 0903) Resp:  [17-25] 21 (06/14 0903) BP: (81-117)/(45-84) 98/58 (06/14 0903) SpO2:  [91 %-97 %] 95 % (06/14 0903) Weight:  [83.7 kg] 83.7 kg (06/14 0500)  Weight change: 7.6 kg Filed Weights   09/08/23 0430 09/09/23 0500 09/10/23 0500  Weight: 82.1 kg 76.1 kg 83.7 kg    Intake/Output: I/O last 3 completed shifts: In: 3044 [P.O.:460; I.V.:1278.8; IV Piggyback:1305.2] Out: 1001 [Urine:1000; Stool:1]   Intake/Output this shift:  Total I/O In: 420 [P.O.:360; I.V.:60] Out: 900 [Urine:900]  Physical Exam: General: NAD, laying in bed.   Head: Right periorbital ecchymosis, moist mucosal membranes  Eyes: PERRL    Neck: Supple  Lungs:  Clear to auscultation  Heart: Regular rate and rhythm  Abdomen:  Soft, nontender  Extremities: No peripheral edema.  Neurologic: Awake, alert, conversant  Skin: Warm,dry, Generalized bruising        Basic Metabolic Panel: Recent Labs  Lab 09/05/23 2025 09/06/23 0112 09/06/23 0321 09/06/23 0705 09/07/23 0337 09/07/23 0809 09/08/23 0318 09/08/23 9528 09/09/23 0422 09/09/23 0742 09/09/23 1540 09/09/23 2009 09/10/23 0007 09/10/23 0348 09/10/23 0840  NA 107* 109* 110*   < > 115*   < > 120*   < > 125*   < > 127* 127* 129* 132* 130*  K  --  3.4* 3.3*  --  3.3*  --  3.3*  --  3.2*  --   --   --   --   --   --   CL  --  74* 76*  --  82*  --  86*  --  92*  --   --   --   --   --   --   CO2  --  19* 20*  --  25  --  23  --  28  --   --   --   --   --   --   GLUCOSE  --  75 75  --  90  --  94  --  123*  --   --   --   --   --   --   BUN  --  6* 7*  --  <5*  --  <5*  --  <5*  --   --   --   --   --   --   CREATININE  --  0.62 0.53*  --  0.50*  --   0.49*  --  0.44*  --   --   --   --   --   --   CALCIUM   --  7.8* 7.5*  --  7.5*  --  7.5*  --  7.5*  --   --   --   --   --   --   MG 1.8  --  1.9  --  1.9  --  2.0  --  1.7  --   --   --   --   --   --   PHOS  --   --  2.0*  --  2.0*  --  2.1*  --  2.8  --   --   --   --   --   --    < > =  values in this interval not displayed.    Liver Function Tests: Recent Labs  Lab 09/05/23 1755  AST 123*  ALT 42  ALKPHOS 48  BILITOT 1.9*  PROT 6.0*  ALBUMIN 2.8*   No results for input(s): LIPASE, AMYLASE in the last 168 hours. No results for input(s): AMMONIA in the last 168 hours.  CBC: Recent Labs  Lab 09/05/23 1755 09/06/23 0321 09/07/23 0337 09/08/23 0318  WBC 13.9* 13.3* 10.3 9.7  NEUTROABS 10.5*  --   --  6.9  HGB 12.1* 11.6* 10.3* 11.5*  HCT RESULTS UNAVAILABLE DUE TO INTERFERING SUBSTANCE RESULTS UNAVAILABLE DUE TO INTERFERING SUBSTANCE 27.7* 31.9*  MCV RESULTS UNAVAILABLE DUE TO INTERFERING SUBSTANCE RESULTS UNAVAILABLE DUE TO INTERFERING SUBSTANCE 82.4 84.8  PLT 290 313 291 321    Cardiac Enzymes: Recent Labs  Lab 09/05/23 1755  CKTOTAL 3,815*    BNP: Invalid input(s): POCBNP  CBG: Recent Labs  Lab 09/09/23 1254 09/09/23 1604 09/09/23 2017 09/10/23 0507 09/10/23 0810  GLUCAP 128* 127* 146* 141* 122*    Microbiology: Results for orders placed or performed during the hospital encounter of 09/05/23  MRSA Next Gen by PCR, Nasal     Status: None   Collection Time: 09/05/23  8:25 PM   Specimen: Nasal Mucosa; Nasal Swab  Result Value Ref Range Status   MRSA by PCR Next Gen NOT DETECTED NOT DETECTED Final    Comment: (NOTE) The GeneXpert MRSA Assay (FDA approved for NASAL specimens only), is one component of a comprehensive MRSA colonization surveillance program. It is not intended to diagnose MRSA infection nor to guide or monitor treatment for MRSA infections. Test performance is not FDA approved in patients less than 69 years old. Performed at  [facility], [address] [place], [place] [address]   Culture, blood (Routine X 2) w Reflex to ID Panel     Status: None (Preliminary result)   Collection Time: 09/06/23  1:12 AM   Specimen: BLOOD  Result Value Ref Range Status   Specimen Description BLOOD BLOOD LEFT ARM  Final   Special Requests   Final    BOTTLES DRAWN AEROBIC AND ANAEROBIC Blood Culture adequate volume   Culture   Final    NO GROWTH 4 DAYS Performed at [facility], [address], [place], [place] [address]    Report Status PENDING  Incomplete  Culture, blood (Routine X 2) w Reflex to ID Panel     Status: None (Preliminary result)   Collection Time: 09/06/23  1:12 AM   Specimen: BLOOD  Result Value Ref Range Status   Specimen Description BLOOD BLOOD LEFT ARM  Final   Special Requests Blood Culture adequate volume  Final   Culture   Final    NO GROWTH 4 DAYS Performed at [facility], [address], [place], [place] [address]    Report Status PENDING  Incomplete    Coagulation Studies: No results for input(s): LABPROT, INR in the last 72 hours.  Urinalysis: No results for input(s): COLORURINE, LABSPEC, PHURINE, GLUCOSEU, HGBUR, BILIRUBINUR, KETONESUR, PROTEINUR, UROBILINOGEN, NITRITE, LEUKOCYTESUR in the last 72 hours.  Invalid input(s): APPERANCEUR     Imaging: DG Chest Port 1 View Result Date: 09/09/2023 CLINICAL DATA:  Cough EXAM: PORTABLE CHEST 1 VIEW COMPARISON:  Chest radiograph dated 09/05/2023 FINDINGS: Normal lung volumes. Increased mild patchy right basilar and left upper lung opacities and dense left retrocardiac opacity. Increased small left pleural effusion. Similar trace right pleural effusion. No pneumothorax. Similar enlarged  cardiomediastinal silhouette. No acute osseous abnormality. Round metallic BB is again seen projecting over the neck. IMPRESSION: 1. Increased mild patchy right basilar and left upper lung opacities and  dense left retrocardiac opacity, which may represent atelectasis or pneumonia. 2. Increased small left pleural effusion. Similar trace right pleural effusion. Electronically Signed   By: Limin  Xu M.D.   On: 09/09/2023 13:20      Medications:    cefTRIAXone  (ROCEPHIN )  IV Stopped (09/09/23 1304)   doxycycline  (VIBRAMYCIN ) IV Stopped (09/10/23 0138)    vitamin C   500 mg Oral BID   Chlorhexidine  Gluconate Cloth  6 each Topical Daily   enoxaparin  (LOVENOX ) injection  40 mg Subcutaneous Q24H   feeding supplement  237 mL Oral TID BM   folic acid   1 mg Oral Daily   guaiFENesin   600 mg Oral BID   metoprolol tartrate  25 mg Oral BID   multivitamin with minerals  1 tablet Oral Daily   mouth rinse  15 mL Mouth Rinse 4 times per day   pantoprazole   40 mg Oral Daily   PHENObarbital   32.5 mg Intravenous Q8H   thiamine   100 mg Oral Daily   alum & mag hydroxide-simeth, chlorpheniramine-HYDROcodone, docusate sodium , LORazepam , mouth rinse, polyethylene glycol  Assessment/ Plan:  [name] is a 68 y.o.  male with past medical conditions including hypertension, alcohol and tobacco abuse, and hyponatremia, who was admitted to [facility] on 09/05/2023 for Hyponatremia [E87.1] Acute metabolic encephalopathy [G93.41]    Severe hyponatremia, patient has history of chronic hyponatremia.  Sodium on ED arrival 104.  Likely secondary to [employer].   - discontinued hypertonic saline - Start sodium chloride  tablets - encourage PO intake.      LOS: 5 Barry Fowler 6/14/202511:14 AM

## 2023-09-10 NOTE — Progress Notes (Signed)
 PHARMACY - ANTICOAGULATION CONSULT NOTE  Pharmacy Consult for heparin  infusion Indication: atrial fibrillation  No Known Allergies  Patient Measurements: Height: 6' (182.9 cm) Weight: 83.7 kg (184 lb 8.4 oz) IBW/kg (Calculated) : 77.6 HEPARIN  DW (KG): 67  Vital Signs: Temp: 97.9 F (36.6 C) (06/14 2000) Temp Source: Oral (06/14 2000) BP: 116/88 (06/14 2000) Pulse Rate: 100 (06/14 2000)  Labs: Recent Labs    09/08/23 0318 09/09/23 0422 09/10/23 1438 09/10/23 2237  HGB 11.5*  --  9.9*  --   HCT 31.9*  --  28.3*  --   PLT 321  --  298  --   HEPARINUNFRC  --   --   --  0.24*  CREATININE 0.49* 0.44*  --   --     Estimated Creatinine Clearance: 99.7 mL/min (A) (by C-G formula based on SCr of 0.44 mg/dL (L)).   Medical History: Past Medical History:  Diagnosis Date   Hypertension     Medications:  Scheduled:   vitamin C   500 mg Oral BID   Chlorhexidine  Gluconate Cloth  6 each Topical Daily   feeding supplement  237 mL Oral TID BM   folic acid   1 mg Oral Daily   guaiFENesin   600 mg Oral BID   metoprolol tartrate  25 mg Oral BID   multivitamin with minerals  1 tablet Oral Daily   mouth rinse  15 mL Mouth Rinse 4 times per day   pantoprazole   40 mg Oral Daily   PHENObarbital   32.5 mg Intravenous Q8H   sodium chloride   1 g Oral TID WC   thiamine   100 mg Oral Daily    Assessment: 68 y.o. male presenting with altered mental status. PMH significant for tobacco use, hypertension, chronic hyponatremia. IV heparin  is being started ISO new onset atrial fibrillation.   Goal of Therapy:  Heparin  level 0.3-0.7 units/ml Monitor platelets by anticoagulation protocol: Yes  06/14 2237 HL 0.24, subtherapeutic   Plan:  Bolus 1000 units x 1 Increase heparin  infusion to 1150 units/hr Will recheck HL in 6 hr after rate change CBC daily while on heparin   Coretta Dexter, PharmD, [facility] 09/10/2023 11:48 PM

## 2023-09-10 NOTE — Progress Notes (Signed)
 PHARMACY - ANTICOAGULATION CONSULT NOTE  Pharmacy Consult for heparin  infusion Indication: atrial fibrillation  No Known Allergies  Patient Measurements: Height: 6' (182.9 cm) Weight: 83.7 kg (184 lb 8.4 oz) IBW/kg (Calculated) : 77.6 HEPARIN  DW (KG): 67  Vital Signs: Temp: 97.9 F (36.6 C) (06/14 1300) Temp Source: Oral (06/14 1300) BP: 107/68 (06/14 1400) Pulse Rate: 115 (06/14 1400)  Labs: Recent Labs    09/08/23 0318 09/09/23 0422  HGB 11.5*  --   HCT 31.9*  --   PLT 321  --   CREATININE 0.49* 0.44*    Estimated Creatinine Clearance: 99.7 mL/min (A) (by C-G formula based on SCr of 0.44 mg/dL (L)).   Medical History: Past Medical History:  Diagnosis Date   Hypertension     Medications:  Scheduled:   vitamin C   500 mg Oral BID   Chlorhexidine  Gluconate Cloth  6 each Topical Daily   enoxaparin  (LOVENOX ) injection  40 mg Subcutaneous Q24H   feeding supplement  237 mL Oral TID BM   folic acid   1 mg Oral Daily   guaiFENesin   600 mg Oral BID   metoprolol tartrate  25 mg Oral BID   multivitamin with minerals  1 tablet Oral Daily   mouth rinse  15 mL Mouth Rinse 4 times per day   pantoprazole   40 mg Oral Daily   PHENObarbital   32.5 mg Intravenous Q8H   sodium chloride   1 g Oral TID WC   thiamine   100 mg Oral Daily    Assessment: 68 y.o. male presenting with altered mental status. PMH significant for tobacco use, hypertension, chronic hyponatremia. IV heparin  is being started ISO new onset atrial fibrillation.   Goal of Therapy:  Heparin  level 0.3-0.7 units/ml Monitor platelets by anticoagulation protocol: Yes   Plan:  Give 4000 units bolus x 1 Start heparin  infusion at 1000 units/hr Check anti-Xa level in 6 hours and daily while on heparin  Continue to monitor H&H and platelets  Adalberto Acton 09/10/2023,2:35 PM

## 2023-09-10 NOTE — Consult Note (Signed)
 Cardiology Consultation   Patient ID: Barry Fowler MRN: [id number]; DOB: 30-Mar-1955  Admit date: 09/05/2023 Date of Consult: 09/10/2023  PCP:  Patient, No Pcp Per   [facility] Providers Cardiologist:  None        Patient Profile: Barry Fowler is a 68 y.o. male with a hx of hypertension, EtOH abuse, tobacco abuse, chronic hyponatremia and recurrent falls  who is being seen 09/10/2023 for the evaluation of new onset atrial fibrillation at the request of Dr. Mosetta Areola.  History of Present Illness: Barry Fowler was found at home by his sister. It was unclear how long he had been down.  She called EMS and he was brought to the ED where he was found to have profound metabolic derangements.  Initial sodium was 104, potassium 3.4, chloride 70, calcium  7.5.  He has a history of alcohol abuse.  He was admitted to the ICU and sodium has been slowly correcting.  He was admitted 6/9 and now developed atrial fibrillation with RVR.  He denies palpitations now ir in the past.  He reports being tired but otherwise  has no symptoms.  He is not active at baseline.  He denies chest pain or shortness of breath.  He hasn't noted any lower extremity edema, orthopnea or PND.    Heart rates in atrial fibrillation are poorly controlled.  Been ordered but not yet due to low blood pressures.  Cardiology consulted for further recommendations.  Sodium the morning has improved to 130.  Potassium 3.2.  He reports frequent falls at home.  This occurs because his legs give out.  He denies preceding chest pain, palpitations, or shortness of breath.   Past Medical History:  Diagnosis Date   Hypertension     Past Surgical History:  Procedure Laterality Date   EYE SURGERY       Home Medications:  Prior to Admission medications   Medication Sig Start Date End Date Taking? Authorizing Provider  losartan (COZAAR) 25 MG tablet Take 25 mg by mouth daily. 06/20/23  Yes [provider]  pravastatin (PRAVACHOL) 20  MG tablet Take 20 mg by mouth daily. 10/16/19  Yes [provider]  folic acid  (FOLVITE ) 1 MG tablet Take 1 tablet (1 mg total) by mouth daily. 12/11/20   Barry Cairo, MD  Multiple Vitamin (MULTIVITAMIN WITH MINERALS) TABS tablet Take 1 tablet by mouth daily. 01/19/20   Barry Fowler, Barry D, DO  pantoprazole  (PROTONIX ) 40 MG tablet Take 1 tablet (40 mg total) by mouth 2 (two) times daily. 01/18/20 01/17/21  Barry Fowler, Barry D, DO  polyethylene glycol (MIRALAX  / GLYCOLAX ) 17 g packet Take 17 g by mouth daily as needed for mild constipation. 12/10/20   Johnson, Clanford L, MD  thiamine  100 MG tablet Take 1 tablet (100 mg total) by mouth daily. 01/19/20   Barry Fowler, Barry D, DO    Scheduled Meds:  vitamin C   500 mg Oral BID   Chlorhexidine  Gluconate Cloth  6 each Topical Daily   enoxaparin  (LOVENOX ) injection  40 mg Subcutaneous Q24H   feeding supplement  237 mL Oral TID BM   folic acid   1 mg Oral Daily   guaiFENesin   600 mg Oral BID   metoprolol tartrate  25 mg Oral BID   multivitamin with minerals  1 tablet Oral Daily   mouth rinse  15 mL Mouth Rinse 4 times per day   pantoprazole   40 mg Oral Daily   PHENObarbital   32.5 mg Intravenous Q8H   sodium  chloride  1 g Oral TID WC   thiamine   100 mg Oral Daily   Continuous Infusions:  cefTRIAXone  (ROCEPHIN )  IV Stopped (09/09/23 1304)   doxycycline  (VIBRAMYCIN ) IV Stopped (09/10/23 0138)   PRN Meds: alum & mag hydroxide-simeth, chlorpheniramine-HYDROcodone, docusate sodium , LORazepam , mouth rinse, polyethylene glycol  Allergies:   No Known Allergies  Social History:   Social History   Socioeconomic History   Marital status: Single    Spouse name: Not on file   Number of children: Not on file   Years of education: Not on file   Highest education level: Not on file  Occupational History   Not on file  Tobacco Use   Smoking status: Every Day    Current packs/day: 1.00    Average packs/day: 1 pack/day for 42.0 years (42.0 ttl pk-yrs)     Types: Cigarettes   Smokeless tobacco: Never  Substance and Sexual Activity   Alcohol use: Yes    Comment: beer daily   Drug use: Not on file   Sexual activity: Not on file  Other Topics Concern   Not on file  Social History Narrative   Not on file   Social Drivers of Health   Financial Resource Strain: Not on file  Food Insecurity: No Food Insecurity (09/06/2023)   Hunger Vital Sign    Worried About Running Out of Food in the Last Year: Never true    Ran Out of Food in the Last Year: Never true  Transportation Needs: No Transportation Needs (09/06/2023)   PRAPARE - Administrator, Civil Service (Medical): No    Lack of Transportation (Non-Medical): No  Physical Activity: Not on file  Stress: Not on file  Social Connections: Unknown (09/06/2023)   Social Connection and Isolation Panel    Frequency of Communication with Friends and Family: Never    Frequency of Social Gatherings with Friends and Family: Never    Attends Religious Services: Never    Database administrator or Organizations: Yes    Attends Banker Meetings: Never    Marital Status: Patient unable to answer  Intimate Partner Violence: Not At Risk (09/06/2023)   Humiliation, Afraid, Rape, and Kick questionnaire    Fear of Current or Ex-Partner: No    Emotionally Abused: No    Physically Abused: No    Sexually Abused: No    Family History:   Father had a heart attack at age 34.  Otherwise no family history of cardiovascular disease.  History reviewed. No pertinent family history.   ROS:  Please see the history of present illness.   All other ROS reviewed and negative.     Physical Exam/Data: Vitals:   09/10/23 0830 09/10/23 0842 09/10/23 0900 09/10/23 0903  BP:   (!) 81/45 (!) 98/58  Pulse: (!) 158 (!) 132 (!) 126 (!) 126  Resp: (!) 25 (!) 21 17 (!) 21  Temp:   97.8 F (36.6 C)   TempSrc:   Oral   SpO2: 91% 94% 97% 95%  Weight:      Height:        Intake/Output Summary  (Last 24 hours) at 09/10/2023 1234 Last data filed at 09/10/2023 0910 Gross per 24 hour  Intake 1694.31 ml  Output 1650 ml  Net 44.31 ml      09/10/2023    5:00 AM 09/09/2023    5:00 AM 09/08/2023    4:30 AM  Last 3 Weights  Weight (lbs) 184 lb 8.4  oz 167 lb 12.3 oz 181 lb  Weight (kg) 83.7 kg 76.1 kg 82.1 kg     VS:  BP 107/68   Pulse (!) 115   Temp 97.9 F (36.6 C) (Oral)   Resp (!) 24   Ht 6' (1.829 m)   Wt 83.7 kg   SpO2 93%   BMI 25.03 kg/m  , BMI Body mass index is 25.03 kg/m. GENERAL:  Well appearing HEENT: Pupils equal round and reactive, fundi not visualized, oral mucosa unremarkable NECK:  No jugular venous distention, waveform within normal limits, carotid upstroke brisk and symmetric, no bruits, no thyromegaly LUNGS:  Clear to auscultation bilaterally HEART:  Tachycardic .  Irregularly irregular.  PMI not displaced or sustained,S1 and S2 within normal limits, no S3, no S4, no clicks, no rubs, no murmurs ABD:  Flat, positive bowel sounds normal in frequency in pitch, no bruits, no rebound, no guarding, no midline pulsatile mass, no hepatomegaly, no splenomegaly EXT:  2 plus pulses throughout, no edema, no cyanosis no clubbing SKIN:  No rashes no nodules NEURO:  Cranial nerves II through XII grossly intact, motor grossly intact throughout PSYCH:  Cognitively intact, oriented to person place and time   EKG:  The EKG was personally reviewed and demonstrates:  none today Telemetry:  Telemetry was personally reviewed and demonstrates:  atrial fibrillation.  Rte >100 bpm  Relevant CV Studies: Echo pending  Laboratory Data: High Sensitivity Troponin:   Recent Labs  Lab 09/06/23 0347 09/06/23 0705  TROPONINIHS 21* 15     Chemistry Recent Labs  Lab 09/07/23 0337 09/07/23 0809 09/08/23 0318 09/08/23 0824 09/09/23 0422 09/09/23 0742 09/10/23 0007 09/10/23 0348 09/10/23 0840  NA 115*   < > 120*   < > 125*   < > 129* 132* 130*  K 3.3*  --  3.3*  --  3.2*  --    --   --   --   CL 82*  --  86*  --  92*  --   --   --   --   CO2 25  --  23  --  28  --   --   --   --   GLUCOSE 90  --  94  --  123*  --   --   --   --   BUN <5*  --  <5*  --  <5*  --   --   --   --   CREATININE 0.50*  --  0.49*  --  0.44*  --   --   --   --   CALCIUM  7.5*  --  7.5*  --  7.5*  --   --   --   --   MG 1.9  --  2.0  --  1.7  --   --   --   --   GFRNONAA >60  --  >60  --  >60  --   --   --   --   ANIONGAP 8  --  11  --  5  --   --   --   --    < > = values in this interval not displayed.    Recent Labs  Lab 09/05/23 1755  PROT 6.0*  ALBUMIN 2.8*  AST 123*  ALT 42  ALKPHOS 48  BILITOT 1.9*   Lipids No results for input(s): CHOL, TRIG, HDL, LABVLDL, LDLCALC, CHOLHDL in the last 168 hours.  Hematology Recent Labs  Lab 09/06/23 0321 09/07/23 0337 09/08/23 0318  WBC 13.3* 10.3 9.7  RBC RESULTS UNAVAILABLE DUE TO INTERFERING SUBSTANCE 3.36* 3.76*  HGB 11.6* 10.3* 11.5*  HCT RESULTS UNAVAILABLE DUE TO INTERFERING SUBSTANCE 27.7* 31.9*  MCV RESULTS UNAVAILABLE DUE TO INTERFERING SUBSTANCE 82.4 84.8  MCH RESULTS UNAVAILABLE DUE TO INTERFERING SUBSTANCE 30.7 30.6  MCHC RESULTS UNAVAILABLE DUE TO INTERFERING SUBSTANCE 37.2* 36.1*  RDW RESULTS UNAVAILABLE DUE TO INTERFERING SUBSTANCE 13.2 13.5  PLT 313 291 321   Thyroid  Recent Labs  Lab 09/05/23 2325  TSH 0.635  FREET4 1.18*    BNPNo results for input(s): BNP, PROBNP in the last 168 hours.  DDimer No results for input(s): DDIMER in the last 168 hours.  Radiology/Studies:  DG Chest Port 1 View Result Date: 09/09/2023 CLINICAL DATA:  Cough EXAM: PORTABLE CHEST 1 VIEW COMPARISON:  Chest radiograph dated 09/05/2023 FINDINGS: Normal lung volumes. Increased mild patchy right basilar and left upper lung opacities and dense left retrocardiac opacity. Increased small left pleural effusion. Similar trace right pleural effusion. No pneumothorax. Similar enlarged cardiomediastinal silhouette. No acute  osseous abnormality. Round metallic BB is again seen projecting over the neck. IMPRESSION: 1. Increased mild patchy right basilar and left upper lung opacities and dense left retrocardiac opacity, which may represent atelectasis or pneumonia. 2. Increased small left pleural effusion. Similar trace right pleural effusion. Electronically Signed   By: Limin  Xu M.Fowler.   On: 09/09/2023 13:20     Assessment and Plan: # Atrial fibrillation: New onset atrial fibrillation.  He is completely asymptomatic.  Agree with adding oral metoprolol and rate control.  He isn't a good candidate for amiodarone given likely underlying liver disease.  Given frequent falls and EtOH abuse, recommend avoiding oral anticoagulation long term.  Will check CBC.  If OK with primary team, recommend adding IV heparin  while he is here.  Will check TSH, K, Mg.  Keep K >4, Mg >2.  Echo is pending.  Hopefully this will resolve as his metabolic derangements resolve.    Risk Assessment/Risk Scores:         CHA2DS2-VASc Score =     This indicates a  % annual risk of stroke. The patient's score is based upon:          For questions or updates, please contact [facility] Please consult [email or web address] for contact info under    Signed, Maudine Sos, MD  09/10/2023 12:34 PM

## 2023-09-10 NOTE — Progress Notes (Signed)
 PROGRESS NOTE    Eliu Batch  BJY:[id number] DOB: 1955/05/05 DOA: 09/05/2023 PCP: Patient, No Pcp Per    Brief Narrative:  68 y.o male with significant PMH of EtOH abuse, tobacco abuse, hypertension, chronic hyponatremia and recurrent falls who presented to the ED with chief complaints of fall.   Per ED reports, patient was found down by sister who called EMS.  It is unclear how long he has been down but reported he fell on Thursday and sustained multiple bruises.  Patient sister reported he has been binge drinking and she normally checks on him a few times per week. 09/05/2023 - Admitted to the ICU with severe hyponatremia and started on gentle hydration with NS.  09/06/2023 - Started on phenobarb taper. Sodium correcting adequately 6/12: Sodium continues to correct.  On 3% saline at 30 cc/h.  Remains on phenobarbital  taper. 6/13: Sodium continues to slowly correct.  Currently 127 6/14: Sodium 132.  Hypertonic saline discontinued.  New onset rapid atrial fibrillation noted today     Assessment & Plan:   Principal Problem:   Acute metabolic encephalopathy Active Problems:   Malnutrition of moderate degree  Prakash is a 68 year old male patient with a past medical history of alcohol abuse, tobacco abuse, hypertension, chronic hyponatremia with sodium between 125 and 130 presenting to the ED with chief complaint of fall. He was found to have severe hyponatremia with sodium of 104. Thought to be secondary to decreased salt intake, dehydration and binge drinking with possible beer potomania.   Severe hyposmolar hyponatremia with increased urine osm and decreased urine sodium consistent with hypovolemia, some component of decreased salt intake.  Sodium 132 on 6/14 Discontinue 3% saline  New onset rapid atrial fibrillation Noted on telemetry 6/14 Plan: Metoprolol tartrate 25 mg p.o. twice daily Hypotension precludes escalation at this time 2D echocardiogram Cardiology consult  Alcohol  abuse Acute alcohol withdrawal Continue phenobarbital  taper IV thiamine  500 mg x 9 total doses Followed by 100 mg p.o. daily  Possible CAP On Rocephin  and doxycycline  5-day course.  Stop date 6/14  DVT prophylaxis: SQ Lovenox  Code Status: Full Family Communication: None Disposition Plan: Status is: Inpatient Remains inpatient appropriate because: Severe hyponatremia   Level of care: Stepdown  Consultants:  None  Procedures:  None  Antimicrobials: None   Subjective: Seen and examined.  Awake and alert.  Sitting up in bed.  Objective: Vitals:   09/10/23 0830 09/10/23 0842 09/10/23 0900 09/10/23 0903  BP:   (!) 81/45 (!) 98/58  Pulse: (!) 158 (!) 132 (!) 126 (!) 126  Resp: (!) 25 (!) 21 17 (!) 21  Temp:   97.8 F (36.6 C)   TempSrc:   Oral   SpO2: 91% 94% 97% 95%  Weight:      Height:        Intake/Output Summary (Last 24 hours) at 09/10/2023 1205 Last data filed at 09/10/2023 0910 Gross per 24 hour  Intake 1694.31 ml  Output 1650 ml  Net 44.31 ml   Filed Weights   09/08/23 0430 09/09/23 0500 09/10/23 0500  Weight: 82.1 kg 76.1 kg 83.7 kg    Examination:  General exam: NAD.  Appears fatigued Respiratory system: Crackles at bases.  Normal work of breathing.  Room air cardiovascular system: S1-S2, tachycardic, irregular rhythm, no murmurs Gastrointestinal system: Soft, NT ND, normal bowel sounds Central nervous system: Alert.  Lethargic.  Oriented x 3.  No focal deficits Extremities: Symmetric 5 x 5 power. Skin: Right periorbital ecchymoses Psychiatry: Judgement and  insight appear impaired. Mood & affect flattened.     Data Reviewed: I have personally reviewed following labs and imaging studies  CBC: Recent Labs  Lab 09/05/23 1755 09/06/23 0321 09/07/23 0337 09/08/23 0318  WBC 13.9* 13.3* 10.3 9.7  NEUTROABS 10.5*  --   --  6.9  HGB 12.1* 11.6* 10.3* 11.5*  HCT RESULTS UNAVAILABLE DUE TO INTERFERING SUBSTANCE RESULTS UNAVAILABLE DUE TO  INTERFERING SUBSTANCE 27.7* 31.9*  MCV RESULTS UNAVAILABLE DUE TO INTERFERING SUBSTANCE RESULTS UNAVAILABLE DUE TO INTERFERING SUBSTANCE 82.4 84.8  PLT 290 313 291 321   Basic Metabolic Panel: Recent Labs  Lab 09/05/23 2025 09/06/23 0112 09/06/23 0321 09/06/23 0705 09/07/23 0337 09/07/23 0809 09/08/23 0318 09/08/23 0824 09/09/23 0422 09/09/23 0742 09/09/23 1540 09/09/23 2009 09/10/23 0007 09/10/23 0348 09/10/23 0840  NA 107* 109* 110*   < > 115*   < > 120*   < > 125*   < > 127* 127* 129* 132* 130*  K  --  3.4* 3.3*  --  3.3*  --  3.3*  --  3.2*  --   --   --   --   --   --   CL  --  74* 76*  --  82*  --  86*  --  92*  --   --   --   --   --   --   CO2  --  19* 20*  --  25  --  23  --  28  --   --   --   --   --   --   GLUCOSE  --  75 75  --  90  --  94  --  123*  --   --   --   --   --   --   BUN  --  6* 7*  --  <5*  --  <5*  --  <5*  --   --   --   --   --   --   CREATININE  --  0.62 0.53*  --  0.50*  --  0.49*  --  0.44*  --   --   --   --   --   --   CALCIUM   --  7.8* 7.5*  --  7.5*  --  7.5*  --  7.5*  --   --   --   --   --   --   MG 1.8  --  1.9  --  1.9  --  2.0  --  1.7  --   --   --   --   --   --   PHOS  --   --  2.0*  --  2.0*  --  2.1*  --  2.8  --   --   --   --   --   --    < > = values in this interval not displayed.   GFR: Estimated Creatinine Clearance: 99.7 mL/min (A) (by C-G formula based on SCr of 0.44 mg/dL (L)). Liver Function Tests: Recent Labs  Lab 09/05/23 1755  AST 123*  ALT 42  ALKPHOS 48  BILITOT 1.9*  PROT 6.0*  ALBUMIN 2.8*   No results for input(s): LIPASE, AMYLASE in the last 168 hours. No results for input(s): AMMONIA in the last 168 hours. Coagulation Profile: No results for input(s): INR, PROTIME in the last 168 hours. Cardiac Enzymes: Recent Labs  Lab 09/05/23  1755  CKTOTAL 3,815*   BNP (last 3 results) No results for input(s): PROBNP in the last 8760 hours. HbA1C: No results for input(s): HGBA1C in the last  72 hours. CBG: Recent Labs  Lab 09/09/23 1604 09/09/23 2017 09/10/23 0507 09/10/23 0810 09/10/23 1153  GLUCAP 127* 146* 141* 122* 135*   Lipid Profile: No results for input(s): CHOL, HDL, LDLCALC, TRIG, CHOLHDL, LDLDIRECT in the last 72 hours. Thyroid Function Tests: No results for input(s): TSH, T4TOTAL, FREET4, T3FREE, THYROIDAB in the last 72 hours.  Anemia Panel: No results for input(s): VITAMINB12, FOLATE, FERRITIN, TIBC, IRON, RETICCTPCT in the last 72 hours. Sepsis Labs: Recent Labs  Lab 09/05/23 2325 09/06/23 0347  PROCALCITON 0.25  --   LATICACIDVEN  --  1.4    Recent Results (from the past 240 hours)  MRSA Next Gen by PCR, Nasal     Status: None   Collection Time: 09/05/23  8:25 PM   Specimen: Nasal Mucosa; Nasal Swab  Result Value Ref Range Status   MRSA by PCR Next Gen NOT DETECTED NOT DETECTED Final    Comment: (NOTE) The GeneXpert MRSA Assay (FDA approved for NASAL specimens only), is one component of a comprehensive MRSA colonization surveillance program. It is not intended to diagnose MRSA infection nor to guide or monitor treatment for MRSA infections. Test performance is not FDA approved in patients less than 21 years old. Performed at [facility] [address] [place], [place] [address]   Culture, blood (Routine X 2) w Reflex to ID Panel     Status: None (Preliminary result)   Collection Time: 09/06/23  1:12 AM   Specimen: BLOOD  Result Value Ref Range Status   Specimen Description BLOOD BLOOD LEFT ARM  Final   Special Requests   Final    BOTTLES DRAWN AEROBIC AND ANAEROBIC Blood Culture adequate volume   Culture   Final    NO GROWTH 4 DAYS Performed at [facility], [address], [place], [place] [address]    Report Status PENDING  Incomplete  Culture, blood (Routine X 2) w Reflex to ID Panel     Status: None (Preliminary result)   Collection Time: 09/06/23  1:12 AM   Specimen:  BLOOD  Result Value Ref Range Status   Specimen Description BLOOD BLOOD LEFT ARM  Final   Special Requests Blood Culture adequate volume  Final   Culture   Final    NO GROWTH 4 DAYS Performed at [facility], [address], [place], [place] [address]    Report Status PENDING  Incomplete         Radiology Studies: DG Chest Port 1 View Result Date: 09/09/2023 CLINICAL DATA:  Cough EXAM: PORTABLE CHEST 1 VIEW COMPARISON:  Chest radiograph dated 09/05/2023 FINDINGS: Normal lung volumes. Increased mild patchy right basilar and left upper lung opacities and dense left retrocardiac opacity. Increased small left pleural effusion. Similar trace right pleural effusion. No pneumothorax. Similar enlarged cardiomediastinal silhouette. No acute osseous abnormality. Round metallic BB is again seen projecting over the neck. IMPRESSION: 1. Increased mild patchy right basilar and left upper lung opacities and dense left retrocardiac opacity, which may represent atelectasis or pneumonia. 2. Increased small left pleural effusion. Similar trace right pleural effusion. Electronically Signed   By: Limin  Xu M.D.   On: 09/09/2023 13:20        Scheduled Meds:  vitamin C   500 mg Oral BID   Chlorhexidine  Gluconate Cloth  6 each Topical  Daily   enoxaparin  (LOVENOX ) injection  40 mg Subcutaneous Q24H   feeding supplement  237 mL Oral TID BM   folic acid   1 mg Oral Daily   guaiFENesin   600 mg Oral BID   metoprolol tartrate  25 mg Oral BID   multivitamin with minerals  1 tablet Oral Daily   mouth rinse  15 mL Mouth Rinse 4 times per day   pantoprazole   40 mg Oral Daily   PHENObarbital   32.5 mg Intravenous Q8H   sodium chloride   1 g Oral TID WC   thiamine   100 mg Oral Daily   Continuous Infusions:  cefTRIAXone  (ROCEPHIN )  IV Stopped (09/09/23 1304)   doxycycline  (VIBRAMYCIN ) IV Stopped (09/10/23 0138)     LOS: 5 days     Tiajuana Fluke, MD [facility]   If 7PM-7AM, please  contact night-coverage  09/10/2023, 12:05 PM

## 2023-09-10 NOTE — Plan of Care (Signed)
  Problem: Clinical Measurements: Goal: Ability to maintain clinical measurements within normal limits will improve Outcome: Progressing Goal: Will remain free from infection Outcome: Progressing Goal: Diagnostic test results will improve Outcome: Progressing Goal: Respiratory complications will improve Outcome: Progressing Goal: Cardiovascular complication will be avoided Outcome: Progressing   Problem: Activity: Goal: Risk for activity intolerance will decrease Outcome: Progressing   Problem: Nutrition: Goal: Adequate nutrition will be maintained Outcome: Progressing   Problem: Coping: Goal: Level of anxiety will decrease Outcome: Progressing   Problem: Elimination: Goal: Will not experience complications related to bowel motility Outcome: Progressing Goal: Will not experience complications related to urinary retention Outcome: Progressing   Problem: Pain Managment: Goal: General experience of comfort will improve and/or be controlled Outcome: Progressing   Problem: Safety: Goal: Ability to remain free from injury will improve Outcome: Progressing

## 2023-09-11 DIAGNOSIS — G9341 Metabolic encephalopathy: Secondary | ICD-10-CM | POA: Diagnosis not present

## 2023-09-11 LAB — BASIC METABOLIC PANEL WITH GFR
Anion gap: 9 (ref 5–15)
BUN: 7 mg/dL — ABNORMAL LOW (ref 8–23)
CO2: 24 mmol/L (ref 22–32)
Calcium: 7.8 mg/dL — ABNORMAL LOW (ref 8.9–10.3)
Chloride: 93 mmol/L — ABNORMAL LOW (ref 98–111)
Creatinine, Ser: 0.45 mg/dL — ABNORMAL LOW (ref 0.61–1.24)
GFR, Estimated: >60 mL/min (ref >60)
Glucose, Bld: 122 mg/dL — ABNORMAL HIGH (ref 70–99)
Potassium: 4.2 mmol/L (ref 3.5–5.1)
Sodium: 126 mmol/L — ABNORMAL LOW (ref 135–145)

## 2023-09-11 LAB — CULTURE, BLOOD (ROUTINE X 2)
Culture: NO GROWTH
Culture: NO GROWTH
Special Requests: ADEQUATE
Special Requests: ADEQUATE

## 2023-09-11 LAB — CBC
HCT: 32.4 % — ABNORMAL LOW (ref 39.0–52.0)
Hemoglobin: 10.8 g/dL — ABNORMAL LOW (ref 13.0–17.0)
MCH: 30.8 pg (ref 26.0–34.0)
MCHC: 33.3 g/dL (ref 30.0–36.0)
MCV: 92.3 fL (ref 80.0–100.0)
Platelets: 260 10*3/uL (ref 150–400)
RBC: 3.51 MIL/uL — ABNORMAL LOW (ref 4.22–5.81)
RDW: 14.7 % (ref 11.5–15.5)
WBC: 9.7 10*3/uL (ref 4.0–10.5)
nRBC: 0 % (ref 0.0–0.2)

## 2023-09-11 LAB — SODIUM
Sodium: 126 mmol/L — ABNORMAL LOW (ref 135–145)
Sodium: 126 mmol/L — ABNORMAL LOW (ref 135–145)

## 2023-09-11 LAB — HEPARIN LEVEL (UNFRACTIONATED)
Heparin Unfractionated: 0.28 [IU]/mL — ABNORMAL LOW (ref 0.30–0.70)
Heparin Unfractionated: 0.27 [IU]/mL — ABNORMAL LOW (ref 0.30–0.70)
Heparin Unfractionated: 0.29 [IU]/mL — ABNORMAL LOW (ref 0.30–0.70)

## 2023-09-11 LAB — GLUCOSE, CAPILLARY
Glucose-Capillary: 117 mg/dL — ABNORMAL HIGH (ref 70–99)
Glucose-Capillary: 124 mg/dL — ABNORMAL HIGH (ref 70–99)
Glucose-Capillary: 131 mg/dL — ABNORMAL HIGH (ref 70–99)

## 2023-09-11 LAB — MAGNESIUM: Magnesium: 1.9 mg/dL (ref 1.7–2.4)

## 2023-09-11 MED ORDER — HEPARIN BOLUS VIA INFUSION
1000.0000 [IU] | Freq: Once | INTRAVENOUS | Status: AC
  Administered 2023-09-11: 1000 [IU] via INTRAVENOUS
  Filled 2023-09-11: qty 1000

## 2023-09-11 MED ORDER — SODIUM CHLORIDE 0.9 % IV SOLN: INTRAVENOUS | Status: DC

## 2023-09-11 MED ORDER — MAGNESIUM SULFATE IN D5W 1-5 GM/100ML-% IV SOLN
1.0000 g | Freq: Once | INTRAVENOUS | Status: AC
  Administered 2023-09-11: 1 g via INTRAVENOUS
  Filled 2023-09-11: qty 100

## 2023-09-11 NOTE — Progress Notes (Signed)
 [facility]  ROUNDING NOTE   Subjective:  No acute events overnight. Transferred to floor. Na 126, stable Patient alert, on 3L [place]. Denies complaints.    Objective:  Vital signs in last 24 hours:  Temp:  [97.4 F (36.3 C)-97.9 F (36.6 C)] 97.9 F (36.6 C) (06/15 0330) Pulse Rate:  [75-131] 102 (06/15 0930) Resp:  [15-24] 19 (06/15 0930) BP: (71-126)/(61-95) 119/87 (06/15 0930) SpO2:  [91 %-98 %] 97 % (06/15 0930) Weight:  [87.3 kg] 87.3 kg (06/15 0400)  Weight change: 3.6 kg Filed Weights   09/09/23 0500 09/10/23 0500 09/11/23 0400  Weight: 76.1 kg 83.7 kg 87.3 kg    Intake/Output: I/O last 3 completed shifts: In: 1848 [P.O.:720; I.V.:628; IV Piggyback:500] Out: 2350 [Urine:2350]   Intake/Output this shift:  Total I/O In: -  Out: 200 [Urine:200]  Physical Exam: General: NAD,   Head: Normocephalic, atraumatic. Moist oral mucosal membranes  Eyes: Anicteric, PERRL  Neck: Supple, trachea midline  Lungs:  Diminished, 3L   Heart: Regular rate and rhythm  Abdomen:  Soft, nontender,   Extremities:  No peripheral edema.  Neurologic: Nonfocal, moving all four extremities  Skin: No lesions  Access: None    Basic Metabolic Panel: Recent Labs  Lab 09/06/23 0321 09/06/23 0705 09/07/23 0337 09/07/23 0809 09/08/23 0318 09/08/23 0824 09/09/23 0422 09/09/23 0742 09/10/23 0840 09/10/23 1227 09/10/23 1438 09/10/23 2237 09/11/23 0557 09/11/23 1100  NA 110*   < > 115*   < > 120*   < > 125*   < > 130* 129*  --  127* 126* 126*  K 3.3*  --  3.3*  --  3.3*  --  3.2*  --   --   --  4.2  --  4.2  --   CL 76*  --  82*  --  86*  --  92*  --   --   --   --   --  93*  --   CO2 20*  --  25  --  23  --  28  --   --   --   --   --  24  --   GLUCOSE 75  --  90  --  94  --  123*  --   --   --   --   --  122*  --   BUN 7*  --  <5*  --  <5*  --  <5*  --   --   --   --   --  7*  --   CREATININE 0.53*  --  0.50*  --  0.49*  --  0.44*  --   --   --   --   --  0.45*  --    CALCIUM  7.5*  --  7.5*  --  7.5*  --  7.5*  --   --   --   --   --  7.8*  --   MG 1.9  --  1.9  --  2.0  --  1.7  --   --   --  1.9  --  1.9  --   PHOS 2.0*  --  2.0*  --  2.1*  --  2.8  --   --   --   --   --   --   --    < > = values in this interval not displayed.    Liver Function Tests: Recent Labs  Lab 09/05/23  1755  AST 123*  ALT 42  ALKPHOS 48  BILITOT 1.9*  PROT 6.0*  ALBUMIN 2.8*   No results for input(s): LIPASE, AMYLASE in the last 168 hours. No results for input(s): AMMONIA in the last 168 hours.  CBC: Recent Labs  Lab 09/05/23 1755 09/06/23 0321 09/07/23 0337 09/08/23 0318 09/10/23 1438  WBC 13.9* 13.3* 10.3 9.7 9.0  NEUTROABS 10.5*  --   --  6.9  --   HGB 12.1* 11.6* 10.3* 11.5* 9.9*  HCT RESULTS UNAVAILABLE DUE TO INTERFERING SUBSTANCE RESULTS UNAVAILABLE DUE TO INTERFERING SUBSTANCE 27.7* 31.9* 28.3*  MCV RESULTS UNAVAILABLE DUE TO INTERFERING SUBSTANCE RESULTS UNAVAILABLE DUE TO INTERFERING SUBSTANCE 82.4 84.8 88.2  PLT 290 313 291 321 298    Cardiac Enzymes: Recent Labs  Lab 09/05/23 1755  CKTOTAL 3,815*    BNP: Invalid input(s): POCBNP  CBG: Recent Labs  Lab 09/10/23 1557 09/10/23 2031 09/10/23 2357 09/11/23 0333 09/11/23 0752  GLUCAP 162* 142* 131* 124* 117*    Microbiology: Results for orders placed or performed during the hospital encounter of 09/05/23  MRSA Next Gen by PCR, Nasal     Status: None   Collection Time: 09/05/23  8:25 PM   Specimen: Nasal Mucosa; Nasal Swab  Result Value Ref Range Status   MRSA by PCR Next Gen NOT DETECTED NOT DETECTED Final    Comment: (NOTE) The GeneXpert MRSA Assay (FDA approved for NASAL specimens only), is one component of a comprehensive MRSA colonization surveillance program. It is not intended to diagnose MRSA infection nor to guide or monitor treatment for MRSA infections. Test performance is not FDA approved in patients less than 58 years old. Performed at [facility], [address] [place], [place] [address]   Culture, blood (Routine X 2) w Reflex to ID Panel     Status: None   Collection Time: 09/06/23  1:12 AM   Specimen: BLOOD  Result Value Ref Range Status   Specimen Description BLOOD BLOOD LEFT ARM  Final   Special Requests   Final    BOTTLES DRAWN AEROBIC AND ANAEROBIC Blood Culture adequate volume   Culture   Final    NO GROWTH 5 DAYS Performed at [facility], [address] [place], [place] [address]    Report Status 09/11/2023 FINAL  Final  Culture, blood (Routine X 2) w Reflex to ID Panel     Status: None   Collection Time: 09/06/23  1:12 AM   Specimen: BLOOD  Result Value Ref Range Status   Specimen Description BLOOD BLOOD LEFT ARM  Final   Special Requests Blood Culture adequate volume  Final   Culture   Final    NO GROWTH 5 DAYS Performed at [facility], [address] [place], [place] [address]    Report Status 09/11/2023 FINAL  Final    Coagulation Studies: No results for input(s): LABPROT, INR in the last 72 hours.  Urinalysis: No results for input(s): COLORURINE, LABSPEC, PHURINE, GLUCOSEU, HGBUR, BILIRUBINUR, KETONESUR, PROTEINUR, UROBILINOGEN, NITRITE, LEUKOCYTESUR in the last 72 hours.  Invalid input(s): APPERANCEUR    Imaging: No results found.   Medications:    sodium chloride  40 mL/hr at 09/11/23 1136   heparin  1,300 Units/hr (09/11/23 0954)    vitamin C   500 mg Oral BID   Chlorhexidine  Gluconate Cloth  6 each Topical Daily   feeding supplement  237 mL Oral TID BM   folic acid   1 mg Oral Daily   guaiFENesin   600 mg Oral BID   metoprolol tartrate  25 mg Oral BID   multivitamin with minerals  1 tablet Oral Daily   mouth rinse  15 mL Mouth Rinse 4 times per day   pantoprazole   40 mg Oral Daily   PHENObarbital   32.5 mg Intravenous Q8H   sodium chloride   1 g Oral TID WC   thiamine   100 mg Oral Daily   alum & mag hydroxide-simeth,  chlorpheniramine-HYDROcodone, docusate sodium , LORazepam , mouth rinse, polyethylene glycol  Assessment/ Plan:  Barry Fowler is a 68 y.o.  male with past medical conditions including hypertension, alcohol and tobacco abuse, and hyponatremia, who was admitted to [facility] on 09/05/2023 for Hyponatremia [E87.1] Acute metabolic encephalopathy [G93.41]      Severe hyponatremia, patient has history of chronic hyponatremia.  Sodium on ED arrival 104.  Likely secondary to [employer].  Stable at 126 - hypertonic saline stopped - Continue sodium chloride  tablets - encourage PO intake.   2. Hypertension Patient currently on metoprolol tartrate. BP 92/66 today  3. Acute anemia Patient history of recurrent falls. Per chart, patient reports falling prior to ED admission and sustained multiple bruises. 06/14 HgB 9.9 Will monitor during the course of admission.       LOS: 6 Ronelle Smallman Marisa Sickles 6/15/202511:46 AM

## 2023-09-11 NOTE — Progress Notes (Signed)
 PHARMACY - ANTICOAGULATION CONSULT NOTE  Pharmacy Consult for heparin  infusion Indication: atrial fibrillation  No Known Allergies  Patient Measurements: Height: 6' (182.9 cm) Weight: 87.3 kg (192 lb 7.4 oz) IBW/kg (Calculated) : 77.6 HEPARIN  DW (KG): 67  Vital Signs: Temp: 97.9 F (36.6 C) (06/15 0330) Temp Source: Oral (06/15 0330) BP: 91/66 (06/15 0605) Pulse Rate: 79 (06/15 0605)  Labs: Recent Labs    09/09/23 0422 09/10/23 1438 09/10/23 2237 09/11/23 0557  HGB  --  9.9*  --   --   HCT  --  28.3*  --   --   PLT  --  298  --   --   HEPARINUNFRC  --   --  0.24* 0.28*  CREATININE 0.44*  --   --   --     Estimated Creatinine Clearance: 99.7 mL/min (A) (by C-G formula based on SCr of 0.44 mg/dL (L)).   Medical History: Past Medical History:  Diagnosis Date   Hypertension     Medications:  Scheduled:   vitamin C   500 mg Oral BID   Chlorhexidine  Gluconate Cloth  6 each Topical Daily   feeding supplement  237 mL Oral TID BM   folic acid   1 mg Oral Daily   guaiFENesin   600 mg Oral BID   metoprolol tartrate  25 mg Oral BID   multivitamin with minerals  1 tablet Oral Daily   mouth rinse  15 mL Mouth Rinse 4 times per day   pantoprazole   40 mg Oral Daily   PHENObarbital   32.5 mg Intravenous Q8H   sodium chloride   1 g Oral TID WC   thiamine   100 mg Oral Daily    Assessment: 68 y.o. male presenting with altered mental status. PMH significant for tobacco use, hypertension, chronic hyponatremia. IV heparin  is being started ISO new onset atrial fibrillation.   Goal of Therapy:  Heparin  level 0.3-0.7 units/ml Monitor platelets by anticoagulation protocol: Yes  06/14 2237 HL 0.24, subtherapeutic 06/15 0557 HL 0.28, subtherapeutic   Plan:  Bolus 1000 units x 1 Increase heparin  infusion to 1300 units/hr Will recheck HL in 6 hr after rate change CBC daily while on heparin   Coretta Dexter, PharmD, [facility] 09/11/2023 6:30 AM

## 2023-09-11 NOTE — Progress Notes (Signed)
 PHARMACY - ANTICOAGULATION CONSULT NOTE  Pharmacy Consult for heparin  infusion Indication: atrial fibrillation  No Known Allergies  Patient Measurements: Height: 6' (182.9 cm) Weight: 87.3 kg (192 lb 7.4 oz) IBW/kg (Calculated) : 77.6 HEPARIN  DW (KG): 67  Vital Signs: Temp: 98.7 F (37.1 C) (06/15 2037) Temp Source: Oral (06/15 2037) BP: 115/81 (06/15 2037) Pulse Rate: 99 (06/15 2037)  Labs: Recent Labs    09/09/23 0422 09/10/23 1438 09/10/23 2237 09/11/23 0557 09/11/23 1247 09/11/23 1412 09/11/23 2107  HGB  --  9.9*  --   --   --  10.8*  --   HCT  --  28.3*  --   --   --  32.4*  --   PLT  --  298  --   --   --  260  --   HEPARINUNFRC  --   --    < > 0.28* 0.27*  --  0.29*  CREATININE 0.44*  --   --  0.45*  --   --   --    < > = values in this interval not displayed.    Estimated Creatinine Clearance: 99.7 mL/min (A) (by C-G formula based on SCr of 0.45 mg/dL (L)).   Medical History: Past Medical History:  Diagnosis Date   Hypertension     Medications:  Scheduled:   vitamin C   500 mg Oral BID   Chlorhexidine  Gluconate Cloth  6 each Topical Daily   feeding supplement  237 mL Oral TID BM   folic acid   1 mg Oral Daily   guaiFENesin   600 mg Oral BID   metoprolol tartrate  25 mg Oral BID   multivitamin with minerals  1 tablet Oral Daily   mouth rinse  15 mL Mouth Rinse 4 times per day   pantoprazole   40 mg Oral Daily   PHENObarbital   32.5 mg Intravenous Q8H   sodium chloride   1 g Oral TID WC   thiamine   100 mg Oral Daily    Assessment: 68 y.o. male presenting with altered mental status. PMH significant for tobacco use, hypertension, chronic hyponatremia. IV heparin  is being started ISO new onset atrial fibrillation.   Goal of Therapy:  Heparin  level 0.3-0.7 units/ml Monitor platelets by anticoagulation protocol: Yes  06/14 2237 HL 0.24, subtherapeutic 06/15 0557 HL 0.28, subtherapeutic 06/15 2107 HL 0.29, subtherapeutic   Plan:  Bolus 1000 units x  1 Increase heparin  infusion to 1550 units/hr Will recheck HL in 6 hr after rate change CBC daily while on heparin   Thank you for involving pharmacy in this patient's care.   Ananias Balls, PharmD Clinical Pharmacist 09/11/2023 10:09 PM

## 2023-09-11 NOTE — Progress Notes (Signed)
 Physical Therapy Treatment Patient Details Name: Barry Fowler MRN: [id number] DOB: Feb 13, 1956 Today's Date: 09/11/2023   History of Present Illness 68 y.o male with significant PMH of EtOH abuse, tobacco abuse, hypertension, chronic hyponatremia and recurrent falls who presented to the ED with chief complaints of fall. Admitted for severe hyponatremia with sodium of 104.    PT Comments  Pt agrees and participates in supine ex.  Mobility is deferred at this time given starting heparinn and currently running for a-fib and high fall risk with mobility +2/3 assist due to ataxia.  Will resume mobility as appropriate.   If plan is discharge home, recommend the following: Two people to help with walking and/or transfers;Two people to help with bathing/dressing/bathroom;Help with stairs or ramp for entrance;Assist for transportation;Assistance with cooking/housework;Supervision due to cognitive status   Can travel by private vehicle        Equipment Recommendations       Recommendations for Other Services       Precautions / Restrictions Precautions Precautions: Fall Recall of Precautions/Restrictions: Intact Restrictions Weight Bearing Restrictions Per Provider Order: No     Mobility  Bed Mobility                    Transfers                        Ambulation/Gait                   Stairs             Wheelchair Mobility     Tilt Bed    Modified Rankin (Stroke Patients Only)       Balance                                            Communication Communication Communication: No apparent difficulties  Cognition Arousal: Alert Behavior During Therapy: WFL for tasks assessed/performed   PT - Cognitive impairments: No apparent impairments                         Following commands: Intact      Cueing Cueing Techniques: Verbal cues  Exercises Other Exercises Other Exercises: supine AROM BLE    General  Comments        Pertinent Vitals/Pain Pain Assessment Pain Assessment: No/denies pain    Home Living                          Prior Function            PT Goals (current goals can now be found in the care plan section) Progress towards PT goals: Progressing toward goals    Frequency    Min 2X/week      PT Plan      Co-evaluation              AM-PAC PT 6 Clicks Mobility   Outcome Measure  Help needed turning from your back to your side while in a flat bed without using bedrails?: None Help needed moving from lying on your back to sitting on the side of a flat bed without using bedrails?: None Help needed moving to and from a bed to a chair (including a wheelchair)?: A Lot Help needed standing up from a chair  using your arms (e.g., wheelchair or bedside chair)?: A Lot Help needed to walk in hospital room?: A Lot Help needed climbing 3-5 steps with a railing? : A Lot 6 Click Score: 16    End of Session   Activity Tolerance: Patient limited by fatigue Patient left: in bed;with call bell/phone within reach;with bed alarm set Nurse Communication: Mobility status PT Visit Diagnosis: Other abnormalities of gait and mobility (R26.89);Difficulty in walking, not elsewhere classified (R26.2);Muscle weakness (generalized) (M62.81)     Time: 7846-9629 PT Time Calculation (min) (ACUTE ONLY): 9 min  Charges:    $Therapeutic Activity: 8-22 mins PT General Charges $$ ACUTE PT VISIT: 1 Visit                   Charlanne Cong, PTA 09/11/23, 2:27 PM

## 2023-09-11 NOTE — Progress Notes (Signed)
 PHARMACY CONSULT NOTE - FOLLOW UP  Pharmacy Consult for Electrolyte Monitoring and Replacement   Recent Labs: Potassium (mmol/L)  Date Value  09/11/2023 4.2  03/24/2013 4.1   Magnesium  (mg/dL)  Date Value  78/29/5621 1.9   Calcium  (mg/dL)  Date Value  30/86/5784 7.8 (L)   Calcium , Total (mg/dL)  Date Value  69/62/9528 8.8   Albumin (g/dL)  Date Value  41/32/4401 2.8 (L)  03/24/2013 3.6   Phosphorus (mg/dL)  Date Value  02/72/5366 2.8   Sodium (mmol/L)  Date Value  09/11/2023 126 (L)  03/24/2013 136     Assessment: 68 y.o. male presenting with altered mental status. PMH significant for tobacco use, hypertension, chronic hyponatremia. Pharmacy is asked to follow and replace electrolytes while in CCU  Goal of Therapy:  Potassium 4.0 - 5.1 mmol/L Magnesium  2.0 - 2.4 mg/dL All Other Electrolytes WNL  Plan:  1 gram IV magnesium  sulfate x 1 Because this consult was generated as part of an ICU order set and patient is transferring pharmacy will sign off for now Please feel free to reach out if any further assistance is needed   Adalberto Acton ,PharmD Clinical Pharmacist 09/11/2023 7:06 AM

## 2023-09-11 NOTE — Progress Notes (Signed)
 PHARMACY - ANTICOAGULATION CONSULT NOTE  Pharmacy Consult for heparin  infusion Indication: atrial fibrillation  No Known Allergies  Patient Measurements: Height: 6' (182.9 cm) Weight: 87.3 kg (192 lb 7.4 oz) IBW/kg (Calculated) : 77.6 HEPARIN  DW (KG): 67  Vital Signs: Temp: 97.9 F (36.6 C) (06/15 0330) Temp Source: Oral (06/15 0330) BP: 119/87 (06/15 0930) Pulse Rate: 102 (06/15 0930)  Labs: Recent Labs    09/09/23 0422 09/10/23 1438 09/10/23 2237 09/11/23 0557  HGB  --  9.9*  --   --   HCT  --  28.3*  --   --   PLT  --  298  --   --   HEPARINUNFRC  --   --  0.24* 0.28*  CREATININE 0.44*  --   --  0.45*    Estimated Creatinine Clearance: 99.7 mL/min (A) (by C-G formula based on SCr of 0.45 mg/dL (L)).   Medical History: Past Medical History:  Diagnosis Date   Hypertension     Medications:  Scheduled:   vitamin C   500 mg Oral BID   Chlorhexidine  Gluconate Cloth  6 each Topical Daily   feeding supplement  237 mL Oral TID BM   folic acid   1 mg Oral Daily   guaiFENesin   600 mg Oral BID   metoprolol tartrate  25 mg Oral BID   multivitamin with minerals  1 tablet Oral Daily   mouth rinse  15 mL Mouth Rinse 4 times per day   pantoprazole   40 mg Oral Daily   PHENObarbital   32.5 mg Intravenous Q8H   sodium chloride   1 g Oral TID WC   thiamine   100 mg Oral Daily    Assessment: 68 y.o. male presenting with altered mental status. PMH significant for tobacco use, hypertension, chronic hyponatremia. IV heparin  is being started ISO new onset atrial fibrillation.   Goal of Therapy:  Heparin  level 0.3-0.7 units/ml Monitor platelets by anticoagulation protocol: Yes  06/14 2237 HL 0.24, subtherapeutic 06/15 0557 HL 0.28, subtherapeutic   Plan:  Bolus 1000 units x 1 Increase heparin  infusion to 1450 units/hr Will recheck HL in 6 hr after rate change CBC daily while on heparin   Barney Boozer, PharmD, BCPS 09/11/2023 11:30 AM

## 2023-09-11 NOTE — Plan of Care (Signed)
  Problem: Education: Goal: Knowledge of General Education information will improve Description: Including pain rating scale, medication(s)/side effects and non-pharmacologic comfort measures Outcome: Not Progressing   Problem: Clinical Measurements: Goal: Ability to maintain clinical measurements within normal limits will improve Outcome: Progressing Goal: Will remain free from infection Outcome: Progressing Goal: Diagnostic test results will improve Outcome: Progressing Goal: Respiratory complications will improve Outcome: Progressing Goal: Cardiovascular complication will be avoided Outcome: Progressing   Problem: Nutrition: Goal: Adequate nutrition will be maintained Outcome: Progressing   Problem: Elimination: Goal: Will not experience complications related to bowel motility Outcome: Progressing Goal: Will not experience complications related to urinary retention Outcome: Progressing

## 2023-09-11 NOTE — Progress Notes (Signed)
 Rounding Note   Patient Name: Barry Fowler Date of Encounter: 09/11/2023  [facility] Cardiologist: None   Subjective Denies palpitations or shortness of breath.   Scheduled Meds:  vitamin C   500 mg Oral BID   Chlorhexidine  Gluconate Cloth  6 each Topical Daily   feeding supplement  237 mL Oral TID BM   folic acid   1 mg Oral Daily   guaiFENesin   600 mg Oral BID   metoprolol tartrate  25 mg Oral BID   multivitamin with minerals  1 tablet Oral Daily   mouth rinse  15 mL Mouth Rinse 4 times per day   pantoprazole   40 mg Oral Daily   PHENObarbital   32.5 mg Intravenous Q8H   sodium chloride   1 g Oral TID WC   thiamine   100 mg Oral Daily   Continuous Infusions:  sodium chloride      heparin  1,300 Units/hr (09/11/23 0954)   PRN Meds: alum & mag hydroxide-simeth, chlorpheniramine-HYDROcodone, docusate sodium , LORazepam , mouth rinse, polyethylene glycol   Vital Signs  Vitals:   09/11/23 0800 09/11/23 0830 09/11/23 0900 09/11/23 0930  BP: 105/87  (!) 110/95 119/87  Pulse: 100 (!) 110 (!) 103 (!) 102  Resp: 17 19 15 19   Temp:      TempSrc:      SpO2: 98% 96% 96% 97%  Weight:      Height:        Intake/Output Summary (Last 24 hours) at 09/11/2023 1118 Last data filed at 09/11/2023 1013 Gross per 24 hour  Intake 801.2 ml  Output 1350 ml  Net -548.8 ml      09/11/2023    4:00 AM 09/10/2023    5:00 AM 09/09/2023    5:00 AM  Last 3 Weights  Weight (lbs) 192 lb 7.4 oz 184 lb 8.4 oz 167 lb 12.3 oz  Weight (kg) 87.3 kg 83.7 kg 76.1 kg      Telemetry Atrial fibrillation.  Rate mostly <100 bpm - Personally Reviewed  ECG  N/a - Personally Reviewed  Physical Exam VS:  BP 119/87   Pulse (!) 102   Temp 97.9 F (36.6 C) (Oral)   Resp 19   Ht 6' (1.829 m)   Wt 87.3 kg   SpO2 97%   BMI 26.10 kg/m  , BMI Body mass index is 26.1 kg/m. GENERAL:  Ill-appearing HEENT: Pupils equal round and reactive, fundi not visualized, oral mucosa unremarkable NECK:  No  jugular venous distention, waveform within normal limits, carotid upstroke brisk and symmetric, no bruits, no thyromegaly LUNGS:  Clear to auscultation bilaterally HEART: Irregularly irregular.  PMI not displaced or sustained,S1 and S2 within normal limits, no S3, no S4, no clicks, no rubs, no murmurs ABD:  Flat, positive bowel sounds normal in frequency in pitch, no bruits, no rebound, no guarding, no midline pulsatile mass, no hepatomegaly, no splenomegaly EXT:  2 plus pulses throughout, no edema, no cyanosis no clubbing SKIN:  No rashes no nodules NEURO:  Cranial nerves II through XII grossly intact, motor grossly intact throughout   Labs High Sensitivity Troponin:   Recent Labs  Lab 09/06/23 0347 09/06/23 0705  TROPONINIHS 21* 15     Chemistry Recent Labs  Lab 09/05/23 1755 09/05/23 2025 09/08/23 0318 09/08/23 0824 09/09/23 0422 09/09/23 0742 09/10/23 1227 09/10/23 1438 09/10/23 2237 09/11/23 0557  NA 104*   < > 120*   < > 125*   < > 129*  --  127* 126*  K 3.7   < >  3.3*  --  3.2*  --   --  4.2  --  4.2  CL 70*   < > 86*  --  92*  --   --   --   --  93*  CO2 17*   < > 23  --  28  --   --   --   --  24  GLUCOSE 78   < > 94  --  123*  --   --   --   --  122*  BUN 9   < > <5*  --  <5*  --   --   --   --  7*  CREATININE 0.51*   < > 0.49*  --  0.44*  --   --   --   --  0.45*  CALCIUM  7.5*   < > 7.5*  --  7.5*  --   --   --   --  7.8*  MG 1.8   < > 2.0  --  1.7  --   --  1.9  --  1.9  PROT 6.0*  --   --   --   --   --   --   --   --   --   ALBUMIN 2.8*  --   --   --   --   --   --   --   --   --   AST 123*  --   --   --   --   --   --   --   --   --   ALT 42  --   --   --   --   --   --   --   --   --   ALKPHOS 48  --   --   --   --   --   --   --   --   --   BILITOT 1.9*  --   --   --   --   --   --   --   --   --   GFRNONAA >60   < > >60  --  >60  --   --   --   --  >60  ANIONGAP 17*   < > 11  --  5  --   --   --   --  9   < > = values in this interval not displayed.     Lipids No results for input(s): CHOL, TRIG, HDL, LABVLDL, LDLCALC, CHOLHDL in the last 168 hours.  Hematology Recent Labs  Lab 09/07/23 0337 09/08/23 0318 09/10/23 1438  WBC 10.3 9.7 9.0  RBC 3.36* 3.76* 3.21*  HGB 10.3* 11.5* 9.9*  HCT 27.7* 31.9* 28.3*  MCV 82.4 84.8 88.2  MCH 30.7 30.6 30.8  MCHC 37.2* 36.1* 35.0  RDW 13.2 13.5 14.4  PLT 291 321 298   Thyroid  Recent Labs  Lab 09/05/23 2325 09/10/23 1438  TSH 0.635 1.259  FREET4 1.18*  --     BNPNo results for input(s): BNP, PROBNP in the last 168 hours.  DDimer No results for input(s): DDIMER in the last 168 hours.   Radiology  No results found.  Cardiac Studies Echo pending  Patient Profile   68 y.o. male with a hx of hypertension, EtOH abuse, tobacco abuse, chronic hyponatremia and recurrent falls  who is being seen 09/10/2023 for the evaluation of new  onset atrial fibrillation   Assessment & Plan   # Atrial fibrillation: New onset atrial fibrillation. He is completely asymptomatic. Agree with adding oral metoprolol and rate control.  Rate is now better controlled.   He isn't a good candidate for amiodarone given likely underlying liver disease. Given frequent falls and EtOH abuse, recommend avoiding oral anticoagulation long term. TSH and electrolytes are stable.  Echo is pending. Hopefully this will resolve as his metabolic derangements resolve.   # Anemia:  Hgb 9.9 yesterday down from 11.5.  He denies melena or hematochezia.  No clear source of bleeding, though he has many bruises that appear unchanged.  Repeat CBC.  If h/h continues to trend down, low threshold to stop heparin .      For questions or updates, please contact [facility] Please consult [email or web address] for contact info under     Signed, Maudine Sos, MD  09/11/2023, 11:18 AM

## 2023-09-11 NOTE — Plan of Care (Signed)
  Problem: Nutrition: Goal: Adequate nutrition will be maintained Outcome: Progressing   Problem: Safety: Goal: Ability to remain free from injury will improve Outcome: Progressing   Problem: Education: Goal: Knowledge of General Education information will improve Description: Including pain rating scale, medication(s)/side effects and non-pharmacologic comfort measures Outcome: Not Progressing   Problem: Health Behavior/Discharge Planning: Goal: Ability to manage health-related needs will improve Outcome: Not Progressing   Problem: Coping: Goal: Level of anxiety will decrease Outcome: Not Progressing   Problem: Pain Managment: Goal: General experience of comfort will improve and/or be controlled Outcome: Not Progressing   Problem: Skin Integrity: Goal: Risk for impaired skin integrity will decrease Outcome: Not Progressing

## 2023-09-11 NOTE — Progress Notes (Signed)
 PROGRESS NOTE    Barry Fowler  JJO:[id number] DOB: 12/23/1955 DOA: 09/05/2023 PCP: Patient, No Pcp Per    Brief Narrative:  68 y.o male with significant PMH of EtOH abuse, tobacco abuse, hypertension, chronic hyponatremia and recurrent falls who presented to the ED with chief complaints of fall.   Per ED reports, patient was found down by sister who called EMS.  It is unclear how long he has been down but reported he fell on Thursday and sustained multiple bruises.  Patient sister reported he has been binge drinking and she normally checks on him a few times per week. 09/05/2023 - Admitted to the ICU with severe hyponatremia and started on gentle hydration with NS.  09/06/2023 - Started on phenobarb taper. Sodium correcting adequately 6/12: Sodium continues to correct.  On 3% saline at 30 cc/h.  Remains on phenobarbital  taper. 6/13: Sodium continues to slowly correct.  Currently 127 6/14: Sodium 132.  Hypertonic saline discontinued.  New onset rapid atrial fibrillation noted today 6/15: Sodium dropped down to 126.  Low rate fluids restarted     Assessment & Plan:   Principal Problem:   Acute metabolic encephalopathy Active Problems:   Malnutrition of moderate degree   Paroxysmal atrial fibrillation (HCC)   Pressure injury of skin  Barry Fowler is a 68 year old male patient with a past medical history of alcohol abuse, tobacco abuse, hypertension, chronic hyponatremia with sodium between 125 and 130 presenting to the ED with chief complaint of fall. He was found to have severe hyponatremia with sodium of 104. Thought to be secondary to decreased salt intake, dehydration and binge drinking with possible beer potomania.   Severe hyposmolar hyponatremia with increased urine osm and decreased urine sodium consistent with hypovolemia, some component of decreased salt intake.  Sodium 132 on 6/14 Subsequently decreased to 126 on 6/15 Plan: Low rate intravenous fluids Discontinue 3% saline  New  onset rapid atrial fibrillation Noted on telemetry 6/14 Plan: Metoprolol tartrate 25 mg p.o. twice daily Hypotension precludes escalation at this time 2D echocardiogram, pending Cardiology consult Empiric heparin  for now Likely a poor candidate for long-term anticoagulation  Alcohol abuse Acute alcohol withdrawal Continue phenobarbital  taper IV thiamine  500 mg x 9 total doses, complete Continue 100 mg p.o. thiamine  daily  Possible CAP 5-day course of Rocephin  and doxycycline  No further antibiotics Monitor vitals and fever curve  DVT prophylaxis: SQ Lovenox  Code Status: Full Family Communication: None Disposition Plan: Status is: Inpatient Remains inpatient appropriate because: Severe hyponatremia   Level of care: Telemetry Medical  Consultants:  None  Procedures:  None  Antimicrobials: None   Subjective: Examined.  Awake and alert.  Slightly confused.  Sitting up in bed.  Objective: Vitals:   09/11/23 0800 09/11/23 0830 09/11/23 0900 09/11/23 0930  BP: 105/87  (!) 110/95 119/87  Pulse: 100 (!) 110 (!) 103 (!) 102  Resp: 17 19 15 19   Temp:      TempSrc:      SpO2: 98% 96% 96% 97%  Weight:      Height:        Intake/Output Summary (Last 24 hours) at 09/11/2023 1422 Last data filed at 09/11/2023 1013 Gross per 24 hour  Intake 561.2 ml  Output 1350 ml  Net -788.8 ml   Filed Weights   09/09/23 0500 09/10/23 0500 09/11/23 0400  Weight: 76.1 kg 83.7 kg 87.3 kg    Examination:  General exam: NAD.  Appears fatigued and frail Respiratory system: Poor respiratory effort.  Normal work of  breathing.  Room air  cardiovascular system: S1-S2, regular rate, irregular rhythm, no murmurs, no pedal edema Gastrointestinal system: Soft, NT ND, normal bowel sounds Central nervous system: Alert.  Lethargic.  Oriented x 3.  No focal deficits Extremities: Symmetric 5 x 5 power. Skin: Right periorbital ecchymoses Psychiatry: Judgement and insight appear impaired. Mood  & affect flattened.     Data Reviewed: I have personally reviewed following labs and imaging studies  CBC: Recent Labs  Lab 09/05/23 1755 09/06/23 0321 09/07/23 0337 09/08/23 0318 09/10/23 1438 09/11/23 1412  WBC 13.9* 13.3* 10.3 9.7 9.0 9.7  NEUTROABS 10.5*  --   --  6.9  --   --   HGB 12.1* 11.6* 10.3* 11.5* 9.9* 10.8*  HCT RESULTS UNAVAILABLE DUE TO INTERFERING SUBSTANCE RESULTS UNAVAILABLE DUE TO INTERFERING SUBSTANCE 27.7* 31.9* 28.3* 32.4*  MCV RESULTS UNAVAILABLE DUE TO INTERFERING SUBSTANCE RESULTS UNAVAILABLE DUE TO INTERFERING SUBSTANCE 82.4 84.8 88.2 92.3  PLT 290 313 291 321 298 260   Basic Metabolic Panel: Recent Labs  Lab 09/06/23 0321 09/06/23 0705 09/07/23 0337 09/07/23 0809 09/08/23 0318 09/08/23 0824 09/09/23 0422 09/09/23 0742 09/10/23 0840 09/10/23 1227 09/10/23 1438 09/10/23 2237 09/11/23 0557 09/11/23 1100  NA 110*   < > 115*   < > 120*   < > 125*   < > 130* 129*  --  127* 126* 126*  K 3.3*  --  3.3*  --  3.3*  --  3.2*  --   --   --  4.2  --  4.2  --   CL 76*  --  82*  --  86*  --  92*  --   --   --   --   --  93*  --   CO2 20*  --  25  --  23  --  28  --   --   --   --   --  24  --   GLUCOSE 75  --  90  --  94  --  123*  --   --   --   --   --  122*  --   BUN 7*  --  <5*  --  <5*  --  <5*  --   --   --   --   --  7*  --   CREATININE 0.53*  --  0.50*  --  0.49*  --  0.44*  --   --   --   --   --  0.45*  --   CALCIUM  7.5*  --  7.5*  --  7.5*  --  7.5*  --   --   --   --   --  7.8*  --   MG 1.9  --  1.9  --  2.0  --  1.7  --   --   --  1.9  --  1.9  --   PHOS 2.0*  --  2.0*  --  2.1*  --  2.8  --   --   --   --   --   --   --    < > = values in this interval not displayed.   GFR: Estimated Creatinine Clearance: 99.7 mL/min (A) (by C-G formula based on SCr of 0.45 mg/dL (L)). Liver Function Tests: Recent Labs  Lab 09/05/23 1755  AST 123*  ALT 42  ALKPHOS 48  BILITOT 1.9*  PROT 6.0*  ALBUMIN 2.8*   No  results for input(s): LIPASE,  AMYLASE in the last 168 hours. No results for input(s): AMMONIA in the last 168 hours. Coagulation Profile: No results for input(s): INR, PROTIME in the last 168 hours. Cardiac Enzymes: Recent Labs  Lab 09/05/23 1755  CKTOTAL 3,815*   BNP (last 3 results) No results for input(s): PROBNP in the last 8760 hours. HbA1C: No results for input(s): HGBA1C in the last 72 hours. CBG: Recent Labs  Lab 09/10/23 1557 09/10/23 2031 09/10/23 2357 09/11/23 0333 09/11/23 0752  GLUCAP 162* 142* 131* 124* 117*   Lipid Profile: No results for input(s): CHOL, HDL, LDLCALC, TRIG, CHOLHDL, LDLDIRECT in the last 72 hours. Thyroid Function Tests: Recent Labs    09/10/23 1438  TSH 1.259    Anemia Panel: No results for input(s): VITAMINB12, FOLATE, FERRITIN, TIBC, IRON, RETICCTPCT in the last 72 hours. Sepsis Labs: Recent Labs  Lab 09/05/23 2325 09/06/23 0347  PROCALCITON 0.25  --   LATICACIDVEN  --  1.4    Recent Results (from the past 240 hours)  MRSA Next Gen by PCR, Nasal     Status: None   Collection Time: 09/05/23  8:25 PM   Specimen: Nasal Mucosa; Nasal Swab  Result Value Ref Range Status   MRSA by PCR Next Gen NOT DETECTED NOT DETECTED Final    Comment: (NOTE) The GeneXpert MRSA Assay (FDA approved for NASAL specimens only), is one component of a comprehensive MRSA colonization surveillance program. It is not intended to diagnose MRSA infection nor to guide or monitor treatment for MRSA infections. Test performance is not FDA approved in patients less than 68 years old. Performed at [facility], [address] [place], [place] [address]   Culture, blood (Routine X 2) w Reflex to ID Panel     Status: None   Collection Time: 09/06/23  1:12 AM   Specimen: BLOOD  Result Value Ref Range Status   Specimen Description BLOOD BLOOD LEFT ARM  Final   Special Requests   Final    BOTTLES DRAWN AEROBIC AND ANAEROBIC Blood Culture  adequate volume   Culture   Final    NO GROWTH 5 DAYS Performed at [facility], [address] [place], [place] [address]    Report Status 09/11/2023 FINAL  Final  Culture, blood (Routine X 2) w Reflex to ID Panel     Status: None   Collection Time: 09/06/23  1:12 AM   Specimen: BLOOD  Result Value Ref Range Status   Specimen Description BLOOD BLOOD LEFT ARM  Final   Special Requests Blood Culture adequate volume  Final   Culture   Final    NO GROWTH 5 DAYS Performed at [facility], [address], [place], [place] [address]    Report Status 09/11/2023 FINAL  Final         Radiology Studies: No results found.       Scheduled Meds:  vitamin C   500 mg Oral BID   Chlorhexidine  Gluconate Cloth  6 each Topical Daily   feeding supplement  237 mL Oral TID BM   folic acid   1 mg Oral Daily   guaiFENesin   600 mg Oral BID   metoprolol tartrate  25 mg Oral BID   multivitamin with minerals  1 tablet Oral Daily   mouth rinse  15 mL Mouth Rinse 4 times per day   pantoprazole   40 mg Oral Daily   PHENObarbital   32.5 mg Intravenous Q8H   sodium chloride   1  g Oral TID WC   thiamine   100 mg Oral Daily   Continuous Infusions:  sodium chloride  40 mL/hr at 09/11/23 1136   heparin  1,450 Units/hr (09/11/23 1404)     LOS: 6 days     Tiajuana Fluke, MD [facility]   If 7PM-7AM, please contact night-coverage  09/11/2023, 2:22 PM

## 2023-09-12 ENCOUNTER — Inpatient Hospital Stay (HOSPITAL_COMMUNITY)
Admit: 2023-09-12 | Discharge: 2023-09-12 | Disposition: A | Discharge status: Home / Self Care | Attending: Internal Medicine

## 2023-09-12 DIAGNOSIS — I4891 Unspecified atrial fibrillation: Secondary | ICD-10-CM

## 2023-09-12 DIAGNOSIS — G9341 Metabolic encephalopathy: Secondary | ICD-10-CM | POA: Diagnosis not present

## 2023-09-12 LAB — ECHOCARDIOGRAM COMPLETE
AR max vel: 3.64 cm2
AV Peak grad: 3.7 mmHg
Ao pk vel: 0.97 m/s
Area-P 1/2: 5.02 cm2
Height: 72 in
MV M vel: 4.48 m/s
MV Peak grad: 80.3 mmHg
S' Lateral: 2.9 cm
Weight: 3022.95 [oz_av]

## 2023-09-12 LAB — CBC
HCT: 28.2 % — ABNORMAL LOW (ref 39.0–52.0)
Hemoglobin: 9.5 g/dL — ABNORMAL LOW (ref 13.0–17.0)
MCH: 30.1 pg (ref 26.0–34.0)
MCHC: 33.7 g/dL (ref 30.0–36.0)
MCV: 89.2 fL (ref 80.0–100.0)
Platelets: 307 10*3/uL (ref 150–400)
RBC: 3.16 MIL/uL — ABNORMAL LOW (ref 4.22–5.81)
RDW: 14.6 % (ref 11.5–15.5)
WBC: 9.2 10*3/uL (ref 4.0–10.5)
nRBC: 0 % (ref 0.0–0.2)

## 2023-09-12 LAB — GLUCOSE, CAPILLARY: Glucose-Capillary: 126 mg/dL — ABNORMAL HIGH (ref 70–99)

## 2023-09-12 LAB — SODIUM
Sodium: 126 mmol/L — ABNORMAL LOW (ref 135–145)
Sodium: 124 mmol/L — ABNORMAL LOW (ref 135–145)

## 2023-09-12 LAB — HEPARIN LEVEL (UNFRACTIONATED)
Heparin Unfractionated: 0.39 [IU]/mL (ref 0.30–0.70)
Heparin Unfractionated: 0.43 [IU]/mL (ref 0.30–0.70)

## 2023-09-12 NOTE — TOC Progression Note (Addendum)
 Transition of Care [facility]) - Progression Note    Patient Details  Name: Barry Fowler MRN: [id number] Date of Birth: [birth year]  Transition of Care [facility]) CM/SW Contact  Odilia Bennett, LCSW Phone Number: 09/12/2023, 10:41 AM  Clinical Narrative: Per RN assessment, patient is not fully oriented. CSW left voicemail for sister, Barry Fowler. Will provide SNF bed offers once she calls back.    11:33 am: Reviewed bed offers with sister, Barry Fowler. She has accepted [facility]. Admissions assistant is aware. CSW sent secure chat to MD to notify.  1:39 pm: [facility] [facility].  Expected Discharge Plan: Skilled Nursing Facility Barriers to Discharge: Continued Medical Work up  Expected Discharge Plan and Services   Discharge Planning Services: CM Consult Post Acute Care Choice: Skilled Nursing Facility Living arrangements for the past 2 months: Apartment                                       Social Determinants of Health (SDOH) Interventions SDOH Screenings   Food Insecurity: No Food Insecurity (09/06/2023)  Housing: Low Risk  (09/06/2023)  Transportation Needs: No Transportation Needs (09/06/2023)  Utilities: Not At Risk (09/06/2023)  Social Connections: Unknown (09/06/2023)  Tobacco Use: High Risk (09/05/2023)    Readmission Risk Interventions     No data to display

## 2023-09-12 NOTE — Progress Notes (Signed)
 Echocardiogram 2D Echocardiogram has been performed.  Barry Fowler 09/12/2023, 4:29 PM

## 2023-09-12 NOTE — Progress Notes (Signed)
 Physical Therapy Treatment Patient Details Name: Barry Fowler MRN: [id number] DOB: July 11, 1955 Today's Date: 09/12/2023   History of Present Illness 68 y.o male with significant PMH of EtOH abuse, tobacco abuse, hypertension, chronic hyponatremia and recurrent falls who presented to the ED with chief complaints of fall. Admitted for severe hyponatremia with sodium of 104.    PT Comments  Pt in bed, co-tx with OT for safety.  He is able to get to EOB with supervision and rails.  Supervision for safety.  He stands with min a x 2 and is able to progress gait 51' with generally unsteady gait.  Verbal cues for awareness and general balance deficits.  Gait quality decreases as he fatigues and is cued to rest.  After seated rest, he stands to attempt again but after a few steps pt is asked to sit due to increased fall risk despite assist.  Pt is taken back to room and returned to bed with min a x 2 and mod cues.  Did request room change to have him closer to nursing station for general safety given fall risk and heparin .  Safety precautions in place.  Overall pt did do better today with session and is progressing towards goals.  He remains a high fall risk and should have +2 for all mobility and recliner follow for longer gait.  O2 at 3 lpm for mobility.  Does desat to 88% but recovers with seated rest and deep breathing.   If plan is discharge home, recommend the following: Two people to help with walking and/or transfers;Two people to help with bathing/dressing/bathroom;Help with stairs or ramp for entrance;Assist for transportation;Assistance with cooking/housework;Supervision due to cognitive status   Can travel by private vehicle        Equipment Recommendations       Recommendations for Other Services       Precautions / Restrictions Precautions Precautions: Fall Recall of Precautions/Restrictions: Intact Restrictions Weight Bearing Restrictions Per Provider Order: No     Mobility  Bed  Mobility Overal bed mobility: Needs Assistance Bed Mobility: Supine to Sit     Supine to sit: Contact guard, Min assist, Used rails     General bed mobility comments: +1 for safety Patient Response: Cooperative  Transfers Overall transfer level: Needs assistance Equipment used: Rolling walker (2 wheels) Transfers: Sit to/from Stand Sit to Stand: Min assist, +2 safety/equipment           General transfer comment: cues for hand placements    Ambulation/Gait Ambulation/Gait assistance: Min assist, +2 physical assistance Gait Distance (Feet): 80 Feet Assistive device: Rolling walker (2 wheels) Gait Pattern/deviations: Step-through pattern, Decreased step length - right, Decreased step length - left Gait velocity: decreased     General Gait Details: generally ataxic wiht +2 needed for hand on  safety and recliner follow   Stairs             Wheelchair Mobility     Tilt Bed Tilt Bed Patient Response: Cooperative  Modified Rankin (Stroke Patients Only)       Balance Overall balance assessment: Needs assistance Sitting-balance support: No upper extremity supported, Feet supported Sitting balance-Leahy Scale: Fair Sitting balance - Comments: +1 for supervision   Standing balance support: Bilateral upper extremity supported Standing balance-Leahy Scale: Poor Standing balance comment: +2 hands on for safety and assist as needed  Communication Communication Communication: No apparent difficulties  Cognition Arousal: Alert     PT - Cognitive impairments: No family/caregiver present to determine baseline, Difficult to assess                         Following commands: Intact      Cueing Cueing Techniques: Verbal cues, Tactile cues  Exercises      General Comments        Pertinent Vitals/Pain Pain Assessment Pain Assessment: No/denies pain    Home Living                          Prior  Function            PT Goals (current goals can now be found in the care plan section) Progress towards PT goals: Progressing toward goals    Frequency    Min 2X/week      PT Plan      Co-evaluation PT/OT/SLP Co-Evaluation/Treatment: Yes Reason for Co-Treatment: For patient/therapist safety;To address functional/ADL transfers PT goals addressed during session: Mobility/safety with mobility OT goals addressed during session: Proper use of Adaptive equipment and DME      AM-PAC PT 6 Clicks Mobility   Outcome Measure  Help needed turning from your back to your side while in a flat bed without using bedrails?: None Help needed moving from lying on your back to sitting on the side of a flat bed without using bedrails?: A Little Help needed moving to and from a bed to a chair (including a wheelchair)?: A Lot Help needed standing up from a chair using your arms (e.g., wheelchair or bedside chair)?: A Lot Help needed to walk in hospital room?: A Lot Help needed climbing 3-5 steps with a railing? : A Lot 6 Click Score: 15    End of Session Equipment Utilized During Treatment: Gait belt;Oxygen Activity Tolerance: Patient limited by fatigue Patient left: in bed;with call bell/phone within reach;with bed alarm set Nurse Communication: Mobility status (request to move room closer to staff for safety when OOB.  returned to bed for safety) PT Visit Diagnosis: Other abnormalities of gait and mobility (R26.89);Difficulty in walking, not elsewhere classified (R26.2);Muscle weakness (generalized) (M62.81)     Time: 5409-8119 PT Time Calculation (min) (ACUTE ONLY): 24 min  Charges:    $Gait Training: 8-22 mins PT General Charges $$ ACUTE PT VISIT: 1 Visit                   Charlanne Cong, PTA 09/12/23, 1:48 PM

## 2023-09-12 NOTE — Progress Notes (Signed)
 Progress Note  Patient Name: Barry Fowler Date of Encounter: 09/12/2023  Primary Cardiologist: New - consult by Dr. Theodis Fiscal, MD  Subjective   No chest pain, dyspnea, palpitations, dizziness, presyncope, or syncope.  Reports frequent falls.  Spontaneously converted to sinus rhythm at 2:37 AM on 6/16 and remains in sinus rhythm.  Echo pending.  Inpatient Medications    Scheduled Meds:  vitamin C   500 mg Oral BID   feeding supplement  237 mL Oral TID BM   folic acid   1 mg Oral Daily   guaiFENesin   600 mg Oral BID   metoprolol tartrate  25 mg Oral BID   multivitamin with minerals  1 tablet Oral Daily   mouth rinse  15 mL Mouth Rinse 4 times per day   pantoprazole   40 mg Oral Daily   sodium chloride   1 g Oral TID WC   thiamine   100 mg Oral Daily   Continuous Infusions:  sodium chloride  40 mL/hr at 09/12/23 0500   heparin  1,550 Units/hr (09/12/23 0500)   PRN Meds: alum & mag hydroxide-simeth, chlorpheniramine-HYDROcodone, docusate sodium , LORazepam , mouth rinse, polyethylene glycol   Vital Signs    Vitals:   09/11/23 2037 09/12/23 0249 09/12/23 0500 09/12/23 0744  BP: 115/81 126/82  128/74  Pulse: 99 77  74  Resp: 20 18  18   Temp: 98.7 F (37.1 C) 98.4 F (36.9 C)  98.6 F (37 C)  TempSrc: Oral Oral  Oral  SpO2: 94% 97%  95%  Weight:   85.7 kg   Height:        Intake/Output Summary (Last 24 hours) at 09/12/2023 1004 Last data filed at 09/12/2023 0500 Gross per 24 hour  Intake 1387.58 ml  Output 1100 ml  Net 287.58 ml   Filed Weights   09/10/23 0500 09/11/23 0400 09/12/23 0500  Weight: 83.7 kg 87.3 kg 85.7 kg    Telemetry    A-fib with conversion to sinus rhythm at 2:37 AM on 6/16 without significant posttermination pause, remains in sinus rhythm with heart rates in the 70s bpm - Personally Reviewed  ECG    No new tracings - Personally Reviewed  Physical Exam   GEN: Chronically ill-appearing.  No acute distress.  Contusion noted along the right  orbit. Neck: No JVD. Cardiac: RRR, no murmurs, rubs, or gallops.  Respiratory: Clear to auscultation bilaterally.  GI: Soft, nontender, non-distended.   MS: No edema; No deformity. Neuro:  Alert and oriented x 3; Nonfocal.  Psych: Normal affect.  Labs    Chemistry Recent Labs  Lab 09/05/23 1755 09/05/23 2025 09/08/23 0318 09/08/23 5409 09/09/23 0422 09/09/23 8119 09/10/23 1438 09/10/23 2237 09/11/23 0557 09/11/23 1100 09/11/23 2307  NA 104*   < > 120*   < > 125*   < >  --    < > 126* 126* 126*  K 3.7   < > 3.3*  --  3.2*  --  4.2  --  4.2  --   --   CL 70*   < > 86*  --  92*  --   --   --  93*  --   --   CO2 17*   < > 23  --  28  --   --   --  24  --   --   GLUCOSE 78   < > 94  --  123*  --   --   --  122*  --   --  BUN 9   < > <5*  --  <5*  --   --   --  7*  --   --   CREATININE 0.51*   < > 0.49*  --  0.44*  --   --   --  0.45*  --   --   CALCIUM  7.5*   < > 7.5*  --  7.5*  --   --   --  7.8*  --   --   PROT 6.0*  --   --   --   --   --   --   --   --   --   --   ALBUMIN 2.8*  --   --   --   --   --   --   --   --   --   --   AST 123*  --   --   --   --   --   --   --   --   --   --   ALT 42  --   --   --   --   --   --   --   --   --   --   ALKPHOS 48  --   --   --   --   --   --   --   --   --   --   BILITOT 1.9*  --   --   --   --   --   --   --   --   --   --   GFRNONAA >60   < > >60  --  >60  --   --   --  >60  --   --   ANIONGAP 17*   < > 11  --  5  --   --   --  9  --   --    < > = values in this interval not displayed.     Hematology Recent Labs  Lab 09/10/23 1438 09/11/23 1412 09/12/23 0052  WBC 9.0 9.7 9.2  RBC 3.21* 3.51* 3.16*  HGB 9.9* 10.8* 9.5*  HCT 28.3* 32.4* 28.2*  MCV 88.2 92.3 89.2  MCH 30.8 30.8 30.1  MCHC 35.0 33.3 33.7  RDW 14.4 14.7 14.6  PLT 298 260 307    Cardiac EnzymesNo results for input(s): TROPONINI in the last 168 hours. No results for input(s): TROPIPOC in the last 168 hours.   BNPNo results for input(s): BNP,  PROBNP in the last 168 hours.   DDimer No results for input(s): DDIMER in the last 168 hours.   Radiology    No results found.  Cardiac Studies   2D echo pending  Patient Profile     68 y.o. male with history of HTN, EtOH abuse, tobacco abuse, chronic hyponatremia, and recurrent falls who is being seen for the evaluation of new onset A-fib.  Assessment & Plan    1.  New onset A-fib: Spontaneously converted to sinus rhythm at 2:37 AM on 09/12/2023.  Continue metoprolol 25 mg twice daily.  Not felt to be a good candidate for long-term anticoagulation use given frequent falls and EtOH abuse.  Reasonable to continue heparin  drip for now until echo has been resulted, if no acute findings would recommend discontinuing heparin  drip.  May benefit from EP evaluation in the outpatient setting for consideration of Watchman.  Potassium, magnesium , TSH normal.  Echo  pending.  2.  Anemia: Denies symptoms concerning for bleeding.  No clear source, though he does have multiple bruises in the setting of recurrent falls.  Continue to monitor on heparin  drip with low threshold to discontinue.  Ongoing management per IM.       For questions or updates, please contact [facility] Please consult [email or web address] for contact info under Cardiology/STEMI.    Signed, Varney Gentleman, PA-C [facility] Pager: [phone] 09/12/2023, 10:04 AM

## 2023-09-12 NOTE — Care Management Important Message (Signed)
 Important Message  Patient Details  Name: Barry Fowler MRN: [id number] Date of Birth: [birth year]   Important Message Given:  Yes - Medicare IM     Tasmia Blumer W, CMA 09/12/2023, 1:16 PM

## 2023-09-12 NOTE — Plan of Care (Signed)

## 2023-09-12 NOTE — Progress Notes (Signed)
 PHARMACY - ANTICOAGULATION CONSULT NOTE  Pharmacy Consult for heparin  infusion Indication: atrial fibrillation  No Known Allergies  Patient Measurements: Height: 6' (182.9 cm) Weight: 87.3 kg (192 lb 7.4 oz) IBW/kg (Calculated) : 77.6 HEPARIN  DW (KG): 67  Vital Signs: Temp: 98.4 F (36.9 C) (06/16 0249) Temp Source: Oral (06/16 0249) BP: 126/82 (06/16 0249) Pulse Rate: 77 (06/16 0249)  Labs: Recent Labs    09/10/23 1438 09/10/23 2237 09/11/23 0557 09/11/23 1247 09/11/23 1412 09/11/23 2107 09/12/23 0052  HGB 9.9*  --   --   --  10.8*  --  9.5*  HCT 28.3*  --   --   --  32.4*  --  28.2*  PLT 298  --   --   --  260  --  307  HEPARINUNFRC  --    < > 0.28* 0.27*  --  0.29* 0.39  CREATININE  --   --  0.45*  --   --   --   --    < > = values in this interval not displayed.    Estimated Creatinine Clearance: 99.7 mL/min (A) (by C-G formula based on SCr of 0.45 mg/dL (L)).   Medical History: Past Medical History:  Diagnosis Date   Hypertension     Medications:  Scheduled:   vitamin C   500 mg Oral BID   feeding supplement  237 mL Oral TID BM   folic acid   1 mg Oral Daily   guaiFENesin   600 mg Oral BID   metoprolol tartrate  25 mg Oral BID   multivitamin with minerals  1 tablet Oral Daily   mouth rinse  15 mL Mouth Rinse 4 times per day   pantoprazole   40 mg Oral Daily   PHENObarbital   32.5 mg Intravenous Q8H   sodium chloride   1 g Oral TID WC   thiamine   100 mg Oral Daily    Assessment: 68 y.o. male presenting with altered mental status. PMH significant for tobacco use, hypertension, chronic hyponatremia. IV heparin  is being started ISO new onset atrial fibrillation.   Goal of Therapy:  Heparin  level 0.3-0.7 units/ml Monitor platelets by anticoagulation protocol: Yes  06/14 2237 HL 0.24, subtherapeutic 06/15 0557 HL 0.28, subtherapeutic 06/15 2107 HL 0.29, subtherapeutic 06/16 0052 HL 0.39, therapeutic x 1 (drawn early)   Plan:  Continue heparin   infusion at 1550 units/hr Will recheck HL in 6 hr to confirm CBC daily while on heparin   Thank you for involving pharmacy in this patient's care.   Coretta Dexter, PharmD, [facility] 09/12/2023 4:48 AM

## 2023-09-12 NOTE — Progress Notes (Signed)
 Occupational Therapy Treatment Patient Details Name: Barry Fowler MRN: [id number] DOB: [birth year] Today's Date: 09/12/2023   History of present illness 68 y.o male with significant PMH of EtOH abuse, tobacco abuse, hypertension, chronic hyponatremia and recurrent falls who presented to the ED with chief complaints of fall. Admitted for severe hyponatremia with sodium of 104.   OT comments  Barry Fowler seen for OT treatment on this date. Upon arrival to room pt in bed, agreeable to tx. Pt requires MIN A +2 + RW to ambulate ~ 80 ft; +2 for safety due to ataxic movement. Pt required verbal cueing for hand placement and moving RW; required one seated rest break during mobility (chair follow).  Pt desats to 88% on 3 L of O2, improved to 97% with seated rest break. Pt educated on pursed lip breathing and use of IS. Pt making good progress toward goals, will continue to follow POC. Discharge recommendation remains appropriate.        If plan is discharge home, recommend the following:  Two people to help with walking and/or transfers;Two people to help with bathing/dressing/bathroom;Help with stairs or ramp for entrance   Equipment Recommendations  BSC/3in1;Other (comment)    Recommendations for Other Services      Precautions / Restrictions Precautions Precautions: Fall Recall of Precautions/Restrictions: Intact Restrictions Weight Bearing Restrictions Per Provider Order: No       Mobility Bed Mobility Overal bed mobility: Needs Assistance Bed Mobility: Supine to Sit, Sit to Supine     Supine to sit: Contact guard, Min assist, Used rails Sit to supine: Contact guard assist        Transfers Overall transfer level: Needs assistance Equipment used: Rolling walker (2 wheels) Transfers: Sit to/from Stand Sit to Stand: Min assist, +2 safety/equipment                 Balance Overall balance assessment: Needs assistance Sitting-balance support: No upper extremity supported, Feet  supported Sitting balance-Leahy Scale: Fair     Standing balance support: Bilateral upper extremity supported, Reliant on assistive device for balance Standing balance-Leahy Scale: Poor                             ADL either performed or assessed with clinical judgement   ADL Overall ADL's : Needs assistance/impaired                                       General ADL Comments: SETUP for drinking in sitting    Extremity/Trunk Assessment Upper Extremity Assessment Upper Extremity Assessment: Overall WFL for tasks assessed   Lower Extremity Assessment Lower Extremity Assessment: Generalized weakness        Vision       Perception     Praxis     Communication Communication Communication: No apparent difficulties   Cognition Arousal: Alert Behavior During Therapy: WFL for tasks assessed/performed, Impulsive Cognition: No family/caregiver present to determine baseline             OT - Cognition Comments: poor insight                 Following commands: Intact        Cueing   Cueing Techniques: Verbal cues, Gestural cues, Tactile cues  Exercises      Shoulder Instructions       General Comments  Pertinent Vitals/ Pain       Pain Assessment Pain Assessment: No/denies pain  Home Living                                          Prior Functioning/Environment              Frequency  Min 2X/week        Progress Toward Goals  OT Goals(current goals can now be found in the care plan section)  Progress towards OT goals: Progressing toward goals  Acute Rehab OT Goals Patient Stated Goal: to walk without falling OT Goal Formulation: With patient Time For Goal Achievement: 09/26/23 Potential to Achieve Goals: Good ADL Goals Pt Will Perform Grooming: with modified independence;standing Pt Will Perform Lower Body Dressing: with modified independence;sit to/from stand Pt Will Transfer to  Toilet: with modified independence;ambulating;regular height toilet  Plan      Co-evaluation    PT/OT/SLP Co-Evaluation/Treatment: Yes Reason for Co-Treatment: For patient/therapist safety;To address functional/ADL transfers PT goals addressed during session: Mobility/safety with mobility OT goals addressed during session: Proper use of Adaptive equipment and DME      AM-PAC OT 6 Clicks Daily Activity     Outcome Measure   Help from another person eating meals?: None Help from another person taking care of personal grooming?: A Little Help from another person toileting, which includes using toliet, bedpan, or urinal?: A Lot Help from another person bathing (including washing, rinsing, drying)?: A Lot Help from another person to put on and taking off regular upper body clothing?: A Little Help from another person to put on and taking off regular lower body clothing?: A Lot 6 Click Score: 16    End of Session Equipment Utilized During Treatment: Gait belt;Rolling walker (2 wheels);Oxygen  OT Visit Diagnosis: Other abnormalities of gait and mobility (R26.89);Muscle weakness (generalized) (M62.81)   Activity Tolerance Patient tolerated treatment well   Patient Left in bed;with call bell/phone within reach;with bed alarm set   Nurse Communication          Time: 1610-9604 OT Time Calculation (min): 26 min  Charges: OT General Charges $OT Visit: 1 Visit OT Treatments $Therapeutic Exercise: 8-22 mins  Stevenson Elbe, Student OT   [employer] 09/12/2023, 2:55 PM

## 2023-09-12 NOTE — Progress Notes (Signed)
 PHARMACY - ANTICOAGULATION CONSULT NOTE  Pharmacy Consult for heparin  infusion Indication: atrial fibrillation  No Known Allergies  Patient Measurements: Height: 6' (182.9 cm) Weight: 85.7 kg (188 lb 15 oz) IBW/kg (Calculated) : 77.6 HEPARIN  DW (KG): 67  Vital Signs: Temp: 98.6 F (37 C) (06/16 0744) Temp Source: Oral (06/16 0744) BP: 128/74 (06/16 0744) Pulse Rate: 74 (06/16 0744)  Labs: Recent Labs    09/10/23 1438 09/10/23 2237 09/11/23 0557 09/11/23 1247 09/11/23 1412 09/11/23 2107 09/12/23 0052 09/12/23 0711  HGB 9.9*  --   --   --  10.8*  --  9.5*  --   HCT 28.3*  --   --   --  32.4*  --  28.2*  --   PLT 298  --   --   --  260  --  307  --   HEPARINUNFRC  --    < > 0.28*   < >  --  0.29* 0.39 0.43  CREATININE  --   --  0.45*  --   --   --   --   --    < > = values in this interval not displayed.    Estimated Creatinine Clearance: 99.7 mL/min (A) (by C-G formula based on SCr of 0.45 mg/dL (L)).   Medical History: Past Medical History:  Diagnosis Date   Hypertension     Medications:  Scheduled:   vitamin C   500 mg Oral BID   feeding supplement  237 mL Oral TID BM   folic acid   1 mg Oral Daily   guaiFENesin   600 mg Oral BID   metoprolol tartrate  25 mg Oral BID   multivitamin with minerals  1 tablet Oral Daily   mouth rinse  15 mL Mouth Rinse 4 times per day   pantoprazole   40 mg Oral Daily   PHENObarbital   32.5 mg Intravenous Q8H   sodium chloride   1 g Oral TID WC   thiamine   100 mg Oral Daily    Assessment: 68 y.o. male presenting with altered mental status. PMH significant for tobacco use, hypertension, chronic hyponatremia. IV heparin  is being started ISO new onset atrial fibrillation.   Goal of Therapy:  Heparin  level 0.3-0.7 units/ml Monitor platelets by anticoagulation protocol: Yes  06/14 2237 HL 0.24, subtherapeutic 06/15 0557 HL 0.28, subtherapeutic 06/15 2107 HL 0.29, subtherapeutic 06/16 0052 HL 0.39, therapeutic x 1    Plan:  therapeutic x 2 Continue heparin  infusion at 1550 units/hr Will check next heparin  level in am CBC daily while on heparin   Thank you for involving pharmacy in this patient's care.   Barney Boozer, PharmD, BCPS  09/12/2023 8:17 AM

## 2023-09-12 NOTE — Progress Notes (Signed)
 [facility]  ROUNDING NOTE   Subjective:   68 year old male with hypertension, alcohol abuse, chronic hyponatremia, admitted for acute metabolic encephalopathy.  Sodium today remains stable at 126.   Objective:  Vital signs in last 24 hours:  Temp:  [97.8 F (36.6 C)-98.7 F (37.1 C)] 98.6 F (37 C) (06/16 0744) Pulse Rate:  [74-101] 74 (06/16 0744) Resp:  [18-20] 18 (06/16 0744) BP: (103-128)/(74-91) 128/74 (06/16 0744) SpO2:  [94 %-97 %] 95 % (06/16 0744) Weight:  [85.7 kg] 85.7 kg (06/16 0500)  Weight change: -1.6 kg Filed Weights   09/10/23 0500 09/11/23 0400 09/12/23 0500  Weight: 83.7 kg 87.3 kg 85.7 kg    Intake/Output: I/O last 3 completed shifts: In: 1526.1 [P.O.:240; I.V.:1191; IV Piggyback:95.1] Out: 1750 [Urine:1750]   Intake/Output this shift:  Total I/O In: -  Out: 300 [Urine:300]  Physical Exam: General: NAD,   Head: Normocephalic, atraumatic. Moist oral mucosal membranes  Eyes: Anicteric, right periorbital ecchymosis  Neck: Supple, trachea midline  Lungs:  Diminished, 3L [place]  Heart: Regular rate and rhythm  Abdomen:  Soft, nontender,   Extremities: Dependent peripheral edema.  Neurologic: Nonfocal, moving all four extremities          Basic Metabolic Panel: Recent Labs  Lab 09/06/23 0321 09/06/23 0705 09/07/23 0337 09/07/23 0809 09/08/23 0318 09/08/23 0824 09/09/23 0422 09/09/23 0742 09/10/23 1227 09/10/23 1438 09/10/23 2237 09/11/23 0557 09/11/23 1100 09/11/23 2307  NA 110*   < > 115*   < > 120*   < > 125*   < > 129*  --  127* 126* 126* 126*  K 3.3*  --  3.3*  --  3.3*  --  3.2*  --   --  4.2  --  4.2  --   --   CL 76*  --  82*  --  86*  --  92*  --   --   --   --  93*  --   --   CO2 20*  --  25  --  23  --  28  --   --   --   --  24  --   --   GLUCOSE 75  --  90  --  94  --  123*  --   --   --   --  122*  --   --   BUN 7*  --  <5*  --  <5*  --  <5*  --   --   --   --  7*  --   --   CREATININE 0.53*  --  0.50*   --  0.49*  --  0.44*  --   --   --   --  0.45*  --   --   CALCIUM  7.5*  --  7.5*  --  7.5*  --  7.5*  --   --   --   --  7.8*  --   --   MG 1.9  --  1.9  --  2.0  --  1.7  --   --  1.9  --  1.9  --   --   PHOS 2.0*  --  2.0*  --  2.1*  --  2.8  --   --   --   --   --   --   --    < > = values in this interval not displayed.    Liver Function Tests: Recent Labs  Lab 09/05/23 1755  AST 123*  ALT 42  ALKPHOS 48  BILITOT 1.9*  PROT 6.0*  ALBUMIN 2.8*   No results for input(s): LIPASE, AMYLASE in the last 168 hours. No results for input(s): AMMONIA in the last 168 hours.  CBC: Recent Labs  Lab 09/05/23 1755 09/06/23 0321 09/07/23 0337 09/08/23 0318 09/10/23 1438 09/11/23 1412 09/12/23 0052  WBC 13.9*   < > 10.3 9.7 9.0 9.7 9.2  NEUTROABS 10.5*  --   --  6.9  --   --   --   HGB 12.1*   < > 10.3* 11.5* 9.9* 10.8* 9.5*  HCT RESULTS UNAVAILABLE DUE TO INTERFERING SUBSTANCE   < > 27.7* 31.9* 28.3* 32.4* 28.2*  MCV RESULTS UNAVAILABLE DUE TO INTERFERING SUBSTANCE   < > 82.4 84.8 88.2 92.3 89.2  PLT 290   < > 291 321 298 260 307   < > = values in this interval not displayed.    Cardiac Enzymes: Recent Labs  Lab 09/05/23 1755  CKTOTAL 3,815*    BNP: Invalid input(s): POCBNP  CBG: Recent Labs  Lab 09/10/23 1557 09/10/23 2031 09/10/23 2357 09/11/23 0333 09/11/23 0752  GLUCAP 162* 142* 131* 124* 117*    Microbiology: Results for orders placed or performed during the hospital encounter of 09/05/23  MRSA Next Gen by PCR, Nasal     Status: None   Collection Time: 09/05/23  8:25 PM   Specimen: Nasal Mucosa; Nasal Swab  Result Value Ref Range Status   MRSA by PCR Next Gen NOT DETECTED NOT DETECTED Final    Comment: (NOTE) The GeneXpert MRSA Assay (FDA approved for NASAL specimens only), is one component of a comprehensive MRSA colonization surveillance program. It is not intended to diagnose MRSA infection nor to guide or monitor treatment for MRSA  infections. Test performance is not FDA approved in patients less than 46 years old. Performed at [facility], [address] [place], [place] [address]   Culture, blood (Routine X 2) w Reflex to ID Panel     Status: None   Collection Time: 09/06/23  1:12 AM   Specimen: BLOOD  Result Value Ref Range Status   Specimen Description BLOOD BLOOD LEFT ARM  Final   Special Requests   Final    BOTTLES DRAWN AEROBIC AND ANAEROBIC Blood Culture adequate volume   Culture   Final    NO GROWTH 5 DAYS Performed at [facility], [address] [place], [place] [address]    Report Status 09/11/2023 FINAL  Final  Culture, blood (Routine X 2) w Reflex to ID Panel     Status: None   Collection Time: 09/06/23  1:12 AM   Specimen: BLOOD  Result Value Ref Range Status   Specimen Description BLOOD BLOOD LEFT ARM  Final   Special Requests Blood Culture adequate volume  Final   Culture   Final    NO GROWTH 5 DAYS Performed at [facility], [address], [place], [place] [address]    Report Status 09/11/2023 FINAL  Final    Coagulation Studies: No results for input(s): LABPROT, INR in the last 72 hours.  Urinalysis: No results for input(s): COLORURINE, LABSPEC, PHURINE, GLUCOSEU, HGBUR, BILIRUBINUR, KETONESUR, PROTEINUR, UROBILINOGEN, NITRITE, LEUKOCYTESUR in the last 72 hours.  Invalid input(s): APPERANCEUR    Imaging: No results found.   Medications:    heparin  1,550 Units/hr (09/12/23 0500)    vitamin C   500 mg Oral BID   feeding supplement  237 mL Oral TID BM   folic acid   1 mg Oral Daily   guaiFENesin   600 mg Oral BID   metoprolol tartrate  25 mg Oral BID   multivitamin with minerals  1 tablet Oral Daily   mouth rinse  15 mL Mouth Rinse 4 times per day   pantoprazole   40 mg Oral Daily   sodium chloride   1 g Oral TID WC   thiamine   100 mg Oral Daily   alum & mag hydroxide-simeth, chlorpheniramine-HYDROcodone, docusate  sodium, LORazepam , mouth rinse, polyethylene glycol  Assessment/ Plan:  [name] is a 68 y.o.  male with past medical conditions including hypertension, alcohol and tobacco abuse, and hyponatremia, who was admitted to [facility] on 09/05/2023 for Hyponatremia [E87.1] Acute metabolic encephalopathy [G93.41]      Severe hyponatremia, patient has history of chronic hyponatremia.  Sodium on ED arrival 104.  Likely secondary to [employer].  Stable at 126 - Encourage oral protein intake - Continue sodium chloride  tablets; currently on 1 g 3 times per day    LOS: 7 Barret Esquivel 6/16/202512:41 PM

## 2023-09-12 NOTE — Progress Notes (Signed)
 PROGRESS NOTE    Barry Fowler  NWG:[id number] DOB: [birth year] DOA: 09/05/2023 PCP: Patient, No Pcp Per    Brief Narrative:  68 y.o male with significant PMH of EtOH abuse, tobacco abuse, hypertension, chronic hyponatremia and recurrent falls who presented to the ED with chief complaints of fall.   Per ED reports, patient was found down by sister who called EMS.  It is unclear how long he has been down but reported he fell on Thursday and sustained multiple bruises.  Patient sister reported he has been binge drinking and she normally checks on him a few times per week. 09/05/2023 - Admitted to the ICU with severe hyponatremia and started on gentle hydration with NS.  09/06/2023 - Started on phenobarb taper. Sodium correcting adequately 6/12: Sodium continues to correct.  On 3% saline at 30 cc/h.  Remains on phenobarbital  taper. 6/13: Sodium continues to slowly correct.  Currently 127 6/14: Sodium 132.  Hypertonic saline discontinued.  New onset rapid atrial fibrillation noted today 6/15: Sodium dropped down to 126.  Low rate fluids restarted 6/16: Sodium remains at 126.  Fluids discontinued.     Assessment & Plan:   Principal Problem:   Acute metabolic encephalopathy Active Problems:   Malnutrition of moderate degree   Paroxysmal atrial fibrillation (HCC)   Pressure injury of skin  Barry Fowler is a 68 year old male patient with a past medical history of alcohol abuse, tobacco abuse, hypertension, chronic hyponatremia with sodium between 125 and 130 presenting to the ED with chief complaint of fall. He was found to have severe hyponatremia with sodium of 104. Thought to be secondary to decreased salt intake, dehydration and binge drinking with possible beer potomania.   Severe hyposmolar hyponatremia with increased urine osm and decreased urine sodium consistent with hypovolemia, some component of decreased salt intake.  Sodium 132 on 6/14 Subsequently decreased to 126 on 6/15 Remains at 126  on 6/16 despite intravenous fluids Plan: Discontinue intravenous fluids.  Suspect chronic hyponatremia.  Encourage intake of protein shakes and fortified supplements to help with sodium and fluid balance  New onset rapid atrial fibrillation Noted on telemetry 6/14 Plan: Metoprolol tartrate 25 mg p.o. twice daily Hypotension precludes escalation at this time 2D echocardiogram, continues to be pending Cardiology consult Empiric heparin  for now Likely a poor candidate for long-term anticoagulation If echo reassuring discontinue IV heparin   Alcohol abuse Acute alcohol withdrawal Continue phenobarbital  taper IV thiamine  500 mg x 9 total doses, complete Continue 100 mg p.o. thiamine  daily  Possible CAP 5-day course of Rocephin  and doxycycline  No further antibiotics Continue to monitor  DVT prophylaxis: SQ Lovenox  Code Status: Full Family Communication: None Disposition Plan: Status is: Inpatient Remains inpatient appropriate because: Severe hyponatremia   Level of care: Telemetry Medical  Consultants:  None  Procedures:  None  Antimicrobials: None   Subjective: Seen and examined.  Awake and alert.  Confused.  Sitting up in bed.  No distress.  Objective: Vitals:   09/11/23 2037 09/12/23 0249 09/12/23 0500 09/12/23 0744  BP: 115/81 126/82  128/74  Pulse: 99 77  74  Resp: 20 18  18   Temp: 98.7 F (37.1 C) 98.4 F (36.9 C)  98.6 F (37 C)  TempSrc: Oral Oral  Oral  SpO2: 94% 97%  95%  Weight:   85.7 kg   Height:        Intake/Output Summary (Last 24 hours) at 09/12/2023 1142 Last data filed at 09/12/2023 0744 Gross per 24 hour  Intake 1387.58 ml  Output 1200 ml  Net 187.58 ml   Filed Weights   09/10/23 0500 09/11/23 0400 09/12/23 0500  Weight: 83.7 kg 87.3 kg 85.7 kg    Examination:  General exam: No acute distress.  Appears frail and fatigued Respiratory system: Poor respiratory effort.  Normal work of breathing.  Room air  cardiovascular system:  S1-S2, RRR, no murmurs, no pedal edema Gastrointestinal system: Soft, NT ND, normal bowel sounds Central nervous system: Alert.  Lethargic.  Oriented x 2.  No focal deficits Extremities: Symmetric 5 x 5 power. Skin: Right periorbital ecchymoses Psychiatry: Judgement and insight appear impaired. Mood & affect flattened.     Data Reviewed: I have personally reviewed following labs and imaging studies  CBC: Recent Labs  Lab 09/05/23 1755 09/06/23 0321 09/07/23 0337 09/08/23 0318 09/10/23 1438 09/11/23 1412 09/12/23 0052  WBC 13.9*   < > 10.3 9.7 9.0 9.7 9.2  NEUTROABS 10.5*  --   --  6.9  --   --   --   HGB 12.1*   < > 10.3* 11.5* 9.9* 10.8* 9.5*  HCT RESULTS UNAVAILABLE DUE TO INTERFERING SUBSTANCE   < > 27.7* 31.9* 28.3* 32.4* 28.2*  MCV RESULTS UNAVAILABLE DUE TO INTERFERING SUBSTANCE   < > 82.4 84.8 88.2 92.3 89.2  PLT 290   < > 291 321 298 260 307   < > = values in this interval not displayed.   Basic Metabolic Panel: Recent Labs  Lab 09/06/23 0321 09/06/23 0705 09/07/23 0337 09/07/23 0809 09/08/23 0318 09/08/23 0824 09/09/23 0422 09/09/23 0742 09/10/23 1227 09/10/23 1438 09/10/23 2237 09/11/23 0557 09/11/23 1100 09/11/23 2307  NA 110*   < > 115*   < > 120*   < > 125*   < > 129*  --  127* 126* 126* 126*  K 3.3*  --  3.3*  --  3.3*  --  3.2*  --   --  4.2  --  4.2  --   --   CL 76*  --  82*  --  86*  --  92*  --   --   --   --  93*  --   --   CO2 20*  --  25  --  23  --  28  --   --   --   --  24  --   --   GLUCOSE 75  --  90  --  94  --  123*  --   --   --   --  122*  --   --   BUN 7*  --  <5*  --  <5*  --  <5*  --   --   --   --  7*  --   --   CREATININE 0.53*  --  0.50*  --  0.49*  --  0.44*  --   --   --   --  0.45*  --   --   CALCIUM  7.5*  --  7.5*  --  7.5*  --  7.5*  --   --   --   --  7.8*  --   --   MG 1.9  --  1.9  --  2.0  --  1.7  --   --  1.9  --  1.9  --   --   PHOS 2.0*  --  2.0*  --  2.1*  --  2.8  --   --   --   --   --   --   --    < > =  values in this interval not displayed.   GFR: Estimated Creatinine Clearance: 99.7 mL/min (A) (by C-G formula based on SCr of 0.45 mg/dL (L)). Liver Function Tests: Recent Labs  Lab 09/05/23 1755  AST 123*  ALT 42  ALKPHOS 48  BILITOT 1.9*  PROT 6.0*  ALBUMIN 2.8*   No results for input(s): LIPASE, AMYLASE in the last 168 hours. No results for input(s): AMMONIA in the last 168 hours. Coagulation Profile: No results for input(s): INR, PROTIME in the last 168 hours. Cardiac Enzymes: Recent Labs  Lab 09/05/23 1755  CKTOTAL 3,815*   BNP (last 3 results) No results for input(s): PROBNP in the last 8760 hours. HbA1C: No results for input(s): HGBA1C in the last 72 hours. CBG: Recent Labs  Lab 09/10/23 1557 09/10/23 2031 09/10/23 2357 09/11/23 0333 09/11/23 0752  GLUCAP 162* 142* 131* 124* 117*   Lipid Profile: No results for input(s): CHOL, HDL, LDLCALC, TRIG, CHOLHDL, LDLDIRECT in the last 72 hours. Thyroid Function Tests: Recent Labs    09/10/23 1438  TSH 1.259    Anemia Panel: No results for input(s): VITAMINB12, FOLATE, FERRITIN, TIBC, IRON, RETICCTPCT in the last 72 hours. Sepsis Labs: Recent Labs  Lab 09/05/23 2325 09/06/23 0347  PROCALCITON 0.25  --   LATICACIDVEN  --  1.4    Recent Results (from the past 240 hours)  MRSA Next Gen by PCR, Nasal     Status: None   Collection Time: 09/05/23  8:25 PM   Specimen: Nasal Mucosa; Nasal Swab  Result Value Ref Range Status   MRSA by PCR Next Gen NOT DETECTED NOT DETECTED Final    Comment: (NOTE) The GeneXpert MRSA Assay (FDA approved for NASAL specimens only), is one component of a comprehensive MRSA colonization surveillance program. It is not intended to diagnose MRSA infection nor to guide or monitor treatment for MRSA infections. Test performance is not FDA approved in patients less than 28 years old. Performed at [facility] [address]  [place], [place] [address]   Culture, blood (Routine X 2) w Reflex to ID Panel     Status: None   Collection Time: 09/06/23  1:12 AM   Specimen: BLOOD  Result Value Ref Range Status   Specimen Description BLOOD BLOOD LEFT ARM  Final   Special Requests   Final    BOTTLES DRAWN AEROBIC AND ANAEROBIC Blood Culture adequate volume   Culture   Final    NO GROWTH 5 DAYS Performed at [facility], [address] [place], [place] [address]    Report Status 09/11/2023 FINAL  Final  Culture, blood (Routine X 2) w Reflex to ID Panel     Status: None   Collection Time: 09/06/23  1:12 AM   Specimen: BLOOD  Result Value Ref Range Status   Specimen Description BLOOD BLOOD LEFT ARM  Final   Special Requests Blood Culture adequate volume  Final   Culture   Final    NO GROWTH 5 DAYS Performed at [facility], [address], [place], [place] [address]    Report Status 09/11/2023 FINAL  Final         Radiology Studies: No results found.       Scheduled Meds:  vitamin C   500 mg Oral BID   feeding supplement  237 mL Oral TID BM   folic acid   1 mg Oral Daily   guaiFENesin   600 mg Oral BID   metoprolol tartrate  25 mg Oral BID  multivitamin with minerals  1 tablet Oral Daily   mouth rinse  15 mL Mouth Rinse 4 times per day   pantoprazole   40 mg Oral Daily   sodium chloride   1 g Oral TID WC   thiamine   100 mg Oral Daily   Continuous Infusions:  heparin  1,550 Units/hr (09/12/23 0500)     LOS: 7 days     Tiajuana Fluke, MD [facility]   If 7PM-7AM, please contact night-coverage  09/12/2023, 11:42 AM

## 2023-09-13 ENCOUNTER — Inpatient Hospital Stay

## 2023-09-13 DIAGNOSIS — R404 Transient alteration of awareness: Secondary | ICD-10-CM | POA: Diagnosis not present

## 2023-09-13 DIAGNOSIS — F17210 Nicotine dependence, cigarettes, uncomplicated: Secondary | ICD-10-CM | POA: Diagnosis not present

## 2023-09-13 DIAGNOSIS — F1090 Alcohol use, unspecified, uncomplicated: Secondary | ICD-10-CM | POA: Diagnosis not present

## 2023-09-13 DIAGNOSIS — R54 Age-related physical debility: Secondary | ICD-10-CM | POA: Diagnosis not present

## 2023-09-13 DIAGNOSIS — K5900 Constipation, unspecified: Secondary | ICD-10-CM | POA: Diagnosis not present

## 2023-09-13 DIAGNOSIS — R059 Cough, unspecified: Secondary | ICD-10-CM | POA: Diagnosis not present

## 2023-09-13 DIAGNOSIS — I10 Essential (primary) hypertension: Secondary | ICD-10-CM | POA: Diagnosis not present

## 2023-09-13 DIAGNOSIS — I5031 Acute diastolic (congestive) heart failure: Secondary | ICD-10-CM | POA: Diagnosis not present

## 2023-09-13 DIAGNOSIS — R0902 Hypoxemia: Secondary | ICD-10-CM | POA: Diagnosis not present

## 2023-09-13 DIAGNOSIS — I4891 Unspecified atrial fibrillation: Secondary | ICD-10-CM | POA: Diagnosis not present

## 2023-09-13 DIAGNOSIS — R590 Localized enlarged lymph nodes: Secondary | ICD-10-CM | POA: Diagnosis not present

## 2023-09-13 DIAGNOSIS — R2681 Unsteadiness on feet: Secondary | ICD-10-CM | POA: Diagnosis not present

## 2023-09-13 DIAGNOSIS — I480 Paroxysmal atrial fibrillation: Secondary | ICD-10-CM | POA: Diagnosis present

## 2023-09-13 DIAGNOSIS — E569 Vitamin deficiency, unspecified: Secondary | ICD-10-CM | POA: Diagnosis not present

## 2023-09-13 DIAGNOSIS — R296 Repeated falls: Secondary | ICD-10-CM | POA: Diagnosis not present

## 2023-09-13 DIAGNOSIS — J811 Chronic pulmonary edema: Secondary | ICD-10-CM | POA: Diagnosis not present

## 2023-09-13 DIAGNOSIS — Y95 Nosocomial condition: Secondary | ICD-10-CM | POA: Diagnosis present

## 2023-09-13 DIAGNOSIS — W19XXXA Unspecified fall, initial encounter: Secondary | ICD-10-CM | POA: Diagnosis not present

## 2023-09-13 DIAGNOSIS — J189 Pneumonia, unspecified organism: Secondary | ICD-10-CM | POA: Diagnosis not present

## 2023-09-13 DIAGNOSIS — E222 Syndrome of inappropriate secretion of antidiuretic hormone: Secondary | ICD-10-CM | POA: Diagnosis not present

## 2023-09-13 DIAGNOSIS — E871 Hypo-osmolality and hyponatremia: Secondary | ICD-10-CM | POA: Diagnosis not present

## 2023-09-13 DIAGNOSIS — I3139 Other pericardial effusion (noninflammatory): Secondary | ICD-10-CM | POA: Diagnosis present

## 2023-09-13 DIAGNOSIS — F1010 Alcohol abuse, uncomplicated: Secondary | ICD-10-CM | POA: Diagnosis present

## 2023-09-13 DIAGNOSIS — R0989 Other specified symptoms and signs involving the circulatory and respiratory systems: Secondary | ICD-10-CM | POA: Diagnosis not present

## 2023-09-13 DIAGNOSIS — R918 Other nonspecific abnormal finding of lung field: Secondary | ICD-10-CM | POA: Diagnosis not present

## 2023-09-13 DIAGNOSIS — R0602 Shortness of breath: Secondary | ICD-10-CM | POA: Diagnosis present

## 2023-09-13 DIAGNOSIS — G9341 Metabolic encephalopathy: Secondary | ICD-10-CM | POA: Diagnosis not present

## 2023-09-13 DIAGNOSIS — Z7401 Bed confinement status: Secondary | ICD-10-CM | POA: Diagnosis not present

## 2023-09-13 DIAGNOSIS — I5032 Chronic diastolic (congestive) heart failure: Secondary | ICD-10-CM | POA: Diagnosis not present

## 2023-09-13 DIAGNOSIS — E785 Hyperlipidemia, unspecified: Secondary | ICD-10-CM | POA: Diagnosis not present

## 2023-09-13 DIAGNOSIS — Z1152 Encounter for screening for COVID-19: Secondary | ICD-10-CM | POA: Diagnosis not present

## 2023-09-13 DIAGNOSIS — Z741 Need for assistance with personal care: Secondary | ICD-10-CM | POA: Diagnosis not present

## 2023-09-13 DIAGNOSIS — F17290 Nicotine dependence, other tobacco product, uncomplicated: Secondary | ICD-10-CM | POA: Diagnosis not present

## 2023-09-13 DIAGNOSIS — I700 Atherosclerosis of aorta: Secondary | ICD-10-CM | POA: Diagnosis not present

## 2023-09-13 DIAGNOSIS — Z720 Tobacco use: Secondary | ICD-10-CM | POA: Diagnosis not present

## 2023-09-13 DIAGNOSIS — J90 Pleural effusion, not elsewhere classified: Secondary | ICD-10-CM | POA: Diagnosis present

## 2023-09-13 DIAGNOSIS — Z79899 Other long term (current) drug therapy: Secondary | ICD-10-CM | POA: Diagnosis not present

## 2023-09-13 DIAGNOSIS — Z743 Need for continuous supervision: Secondary | ICD-10-CM | POA: Diagnosis not present

## 2023-09-13 DIAGNOSIS — J9601 Acute respiratory failure with hypoxia: Secondary | ICD-10-CM | POA: Diagnosis present

## 2023-09-13 DIAGNOSIS — I110 Hypertensive heart disease with heart failure: Secondary | ICD-10-CM | POA: Diagnosis not present

## 2023-09-13 DIAGNOSIS — E8770 Fluid overload, unspecified: Secondary | ICD-10-CM | POA: Diagnosis not present

## 2023-09-13 DIAGNOSIS — K219 Gastro-esophageal reflux disease without esophagitis: Secondary | ICD-10-CM | POA: Diagnosis not present

## 2023-09-13 DIAGNOSIS — E440 Moderate protein-calorie malnutrition: Secondary | ICD-10-CM | POA: Diagnosis not present

## 2023-09-13 DIAGNOSIS — Z8249 Family history of ischemic heart disease and other diseases of the circulatory system: Secondary | ICD-10-CM | POA: Diagnosis not present

## 2023-09-13 DIAGNOSIS — I272 Pulmonary hypertension, unspecified: Secondary | ICD-10-CM | POA: Diagnosis not present

## 2023-09-13 DIAGNOSIS — Z9889 Other specified postprocedural states: Secondary | ICD-10-CM | POA: Diagnosis not present

## 2023-09-13 DIAGNOSIS — M6259 Muscle wasting and atrophy, not elsewhere classified, multiple sites: Secondary | ICD-10-CM | POA: Diagnosis not present

## 2023-09-13 LAB — CBC
HCT: 27.9 % — ABNORMAL LOW (ref 39.0–52.0)
Hemoglobin: 9.4 g/dL — ABNORMAL LOW (ref 13.0–17.0)
MCH: 30.1 pg (ref 26.0–34.0)
MCHC: 33.7 g/dL (ref 30.0–36.0)
MCV: 89.4 fL (ref 80.0–100.0)
Platelets: 327 K/uL (ref 150–400)
RBC: 3.12 MIL/uL — ABNORMAL LOW (ref 4.22–5.81)
RDW: 14.6 % (ref 11.5–15.5)
WBC: 8.4 K/uL (ref 4.0–10.5)
nRBC: 0 % (ref 0.0–0.2)

## 2023-09-13 LAB — HEPARIN LEVEL (UNFRACTIONATED): Heparin Unfractionated: 0.28 [IU]/mL — ABNORMAL LOW (ref 0.30–0.70)

## 2023-09-13 LAB — SODIUM: Sodium: 124 mmol/L — ABNORMAL LOW (ref 135–145)

## 2023-09-13 MED ORDER — METOPROLOL TARTRATE 25 MG PO TABS: 25.0000 mg | ORAL_TABLET | Freq: Two times a day (BID) | ORAL | Status: AC

## 2023-09-13 MED ORDER — HEPARIN BOLUS VIA INFUSION
1000.0000 [IU] | Freq: Once | INTRAVENOUS | Status: AC
  Administered 2023-09-13: 1000 [IU] via INTRAVENOUS
  Filled 2023-09-13: qty 1000

## 2023-09-13 MED ORDER — ENOXAPARIN SODIUM 40 MG/0.4ML IJ SOSY
40.0000 mg | PREFILLED_SYRINGE | INTRAMUSCULAR | Status: DC
  Administered 2023-09-13: 40 mg via SUBCUTANEOUS
  Filled 2023-09-13: qty 0.4

## 2023-09-13 MED ORDER — PANTOPRAZOLE SODIUM 40 MG PO TBEC: 40.0000 mg | DELAYED_RELEASE_TABLET | Freq: Every day | ORAL | Status: AC

## 2023-09-13 MED ORDER — SODIUM CHLORIDE 1 G PO TABS: 1.0000 g | ORAL_TABLET | Freq: Three times a day (TID) | ORAL | Status: AC

## 2023-09-13 MED ORDER — ENSURE PLUS HIGH PROTEIN PO LIQD: 237.0000 mL | Freq: Three times a day (TID) | ORAL | Status: AC

## 2023-09-13 MED ORDER — ASCORBIC ACID 500 MG PO TABS: 500.0000 mg | ORAL_TABLET | Freq: Two times a day (BID) | ORAL | Status: AC

## 2023-09-13 MED ORDER — FUROSEMIDE 10 MG/ML IJ SOLN
20.0000 mg | Freq: Once | INTRAMUSCULAR | Status: AC
  Administered 2023-09-13: 20 mg via INTRAVENOUS
  Filled 2023-09-13: qty 4

## 2023-09-13 NOTE — TOC Progression Note (Signed)
 Transition of Care [facility]) - Progression Note    Patient Details  Name: Barry Fowler MRN: [id number] Date of Birth: 1956-03-08  [facility]) CM/SW Contact  Crayton Docker, RN 09/13/2023, 9:54 AM  Clinical Narrative:     CM alert to Delane Fear, CMA and Leoma Raja, CMA regarding SNF auth status for [facility].    Expected Discharge Plan: Skilled Nursing Facility Barriers to Discharge: Continued Medical Work up  Expected Discharge Plan and Services   Discharge Planning Services: CM Consult Post Acute Care Choice: Skilled Nursing Facility Living arrangements for the past 2 months: Apartment   Social Determinants of Health (SDOH) Interventions SDOH Screenings   Food Insecurity: No Food Insecurity (09/06/2023)  Housing: Low Risk  (09/06/2023)  Transportation Needs: No Transportation Needs (09/06/2023)  Utilities: Not At Risk (09/06/2023)  Social Connections: Unknown (09/06/2023)  Tobacco Use: High Risk (09/05/2023)    Readmission Risk Interventions     No data to display

## 2023-09-13 NOTE — Plan of Care (Signed)

## 2023-09-13 NOTE — Discharge Summary (Signed)
 Physician Discharge Summary  Barry Fowler ZOX:[id number] DOB: [birth year] DOA: 09/05/2023  PCP: Patient, No Pcp Per  Admit date: 09/05/2023 Discharge date: 09/13/2023  Admitted From: Home Disposition:  SNF  Recommendations for Outpatient Follow-up:  Follow up with PCP in 1-2 weeks   Home Health:No  Equipment/Devices:None   Discharge Condition:Stable  CODE STATUS:FULL  Diet recommendation: Reg  Brief/Interim Summary:  68 y.o male with significant PMH of EtOH abuse, tobacco abuse, hypertension, chronic hyponatremia and recurrent falls who presented to the ED with chief complaints of fall.   Per ED reports, patient was found down by sister who called EMS.  It is unclear how long he has been down but reported he fell on Thursday and sustained multiple bruises.  Patient sister reported he has been binge drinking and she normally checks on him a few times per week. 09/05/2023 - Admitted to the ICU with severe hyponatremia and started on gentle hydration with NS.  09/06/2023 - Started on phenobarb taper. Sodium correcting adequately 6/12: Sodium continues to correct.  On 3% saline at 30 cc/h.  Remains on phenobarbital  taper. 6/13: Sodium continues to slowly correct.  Currently 127 6/14: Sodium 132.  Hypertonic saline discontinued.  New onset rapid atrial fibrillation noted today 6/15: Sodium dropped down to 126.  Low rate fluids restarted 6/16: Sodium remains at 126.  Fluids discontinued. 6/17: Na stable low at 124.  Appropriate for dc.  Hep gtt stopped.      Discharge Diagnoses:  Principal Problem:   Acute metabolic encephalopathy Active Problems:   Malnutrition of moderate degree   Paroxysmal atrial fibrillation (HCC)   Pressure injury of skin  Barry Fowler is a 68 year old male patient with a past medical history of alcohol abuse, tobacco abuse, hypertension, chronic hyponatremia with sodium between 125 and 130 presenting to the ED with chief complaint of fall. He was found to have severe  hyponatremia with sodium of 104. Thought to be secondary to decreased salt intake, dehydration and binge drinking with possible beer potomania.    Severe hyposmolar hyponatremia with increased urine osm and decreased urine sodium consistent with hypovolemia, some component of decreased salt intake.  Sodium 132 on 6/14 Subsequently decreased to 126 on 6/15 Na stable at 124 at time of dc Plan: DC to SNF Salt tabs 1 TID  Tid protein shakes   New onset rapid atrial fibrillation Noted on telemetry 6/14 Plan: Metoprolol tartrate 25 mg p.o. twice daily DC hep gtt Poor candidate for DOAC Consider outpatient referral to EP for Watchman   Alcohol abuse Acute alcohol withdrawal Completed phenobarb taper Continue 100 mg p.o. thiamine  daily   Possible CAP 5-day course of Rocephin  and doxycycline  No further antibiotics   Discharge Instructions  Discharge Instructions     Diet - low sodium heart healthy   Complete by: As directed    Increase activity slowly   Complete by: As directed    No wound care   Complete by: As directed       Allergies as of 09/13/2023   No Known Allergies      Medication List     PAUSE taking these medications    losartan 25 MG tablet Wait to take this until your doctor or other care provider tells you to start again. Commonly known as: COZAAR Take 25 mg by mouth daily.       TAKE these medications    ascorbic acid  500 MG tablet Commonly known as: VITAMIN C  Take 1 tablet (500 mg total) by mouth  2 (two) times daily.   feeding supplement Liqd Take 237 mLs by mouth 3 (three) times daily between meals.   folic acid  1 MG tablet Commonly known as: FOLVITE  Take 1 tablet (1 mg total) by mouth daily.   metoprolol tartrate 25 MG tablet Commonly known as: LOPRESSOR Take 1 tablet (25 mg total) by mouth 2 (two) times daily.   multivitamin with minerals Tabs tablet Take 1 tablet by mouth daily.   pantoprazole  40 MG tablet Commonly known as:  Protonix  Take 1 tablet (40 mg total) by mouth daily. What changed: when to take this   polyethylene glycol 17 g packet Commonly known as: MIRALAX  / GLYCOLAX  Take 17 g by mouth daily as needed for mild constipation.   pravastatin 20 MG tablet Commonly known as: PRAVACHOL Take 20 mg by mouth daily.   sodium chloride  1 g tablet Take 1 tablet (1 g total) by mouth 3 (three) times daily with meals.   thiamine  100 MG tablet Commonly known as: VITAMIN B1 Take 1 tablet (100 mg total) by mouth daily.        Contact information for after-discharge care     Destination     [facility] [place], [place]. Aaron Aas   Service: Skilled Nursing Contact information: [address] [place]  [address] [phone]                    No Known Allergies  Consultations: Cardiology- [facility]   Procedures/Studies: ECHOCARDIOGRAM COMPLETE Result Date: 09/12/2023    ECHOCARDIOGRAM REPORT   Patient Name:   Barry Fowler Date of Exam: 09/12/2023 Medical Rec #:  [id number]   Height:       72.0 in Accession #:    [id number]  Weight:       188.9 lb Date of Birth:  July 07, 1955   BSA:          2.080 m Patient Age:    67 years    BP:           128/74 mmHg Patient Gender: M           HR:           75 bpm. Exam Location:  [facility] Procedure: 2D Echo, Cardiac Doppler and Color Doppler (Both Spectral and Color            Flow Doppler were utilized during procedure). Indications:     Atrial Fibrillation I48.91  History:         Patient has no prior history of Echocardiogram examinations.                  Arrythmias:Atrial Fibrillation.  Sonographer:     Terrilee Few RCS Referring Phys:  [id number] Tiajuana Fluke Diagnosing Phys: Sammy Crisp MD IMPRESSIONS  1. Left ventricular ejection fraction, by estimation, is 55 to 60%. The left ventricle has normal function. Left ventricular endocardial border not optimally defined to evaluate regional wall motion. Left ventricular diastolic parameters were normal.   2. Right ventricular systolic function is low normal. The right ventricular size is normal. Mildly increased right ventricular wall thickness. There is mildly elevated pulmonary artery systolic pressure. The estimated right ventricular systolic pressure  is 35.6 mmHg.  3. Right atrial size was mildly dilated.  4. Moderate pericardial effusion. The pericardial effusion is circumferential. There is no evidence of cardiac tamponade.  5. The mitral valve is normal in structure. Mild mitral valve regurgitation. No evidence of mitral stenosis.  6. Tricuspid valve regurgitation is  mild to moderate.  7. The aortic valve is tricuspid. There is mild thickening of the aortic valve. Aortic valve regurgitation is not visualized. Aortic valve sclerosis is present, with no evidence of aortic valve stenosis.  8. The inferior vena cava is dilated in size with <50% respiratory variability, suggesting right atrial pressure of 15 mmHg. FINDINGS  Left Ventricle: Left ventricular ejection fraction, by estimation, is 55 to 60%. The left ventricle has normal function. Left ventricular endocardial border not optimally defined to evaluate regional wall motion. The left ventricular internal cavity size was normal in size. There is borderline left ventricular hypertrophy. Left ventricular diastolic parameters were normal. Right Ventricle: The right ventricular size is normal. Mildly increased right ventricular wall thickness. Right ventricular systolic function is low normal. There is mildly elevated pulmonary artery systolic pressure. The tricuspid regurgitant velocity is 2.27 m/s, and with an assumed right atrial pressure of 15 mmHg, the estimated right ventricular systolic pressure is 35.6 mmHg. Left Atrium: Left atrial size was normal in size. Right Atrium: Right atrial size was mildly dilated. Pericardium: A moderately sized pericardial effusion is present. The pericardial effusion is circumferential. There is no evidence of cardiac  tamponade. Mitral Valve: The mitral valve is normal in structure. Mild mitral valve regurgitation. No evidence of mitral valve stenosis. Tricuspid Valve: The tricuspid valve is normal in structure. Tricuspid valve regurgitation is mild to moderate. Aortic Valve: The aortic valve is tricuspid. There is mild thickening of the aortic valve. There is mild aortic valve annular calcification. Aortic valve regurgitation is not visualized. Aortic valve sclerosis is present, with no evidence of aortic valve  stenosis. Aortic valve peak gradient measures 3.7 mmHg. Pulmonic Valve: The pulmonic valve was not well visualized. Pulmonic valve regurgitation is trivial. No evidence of pulmonic stenosis. Aorta: The aortic root and ascending aorta are structurally normal, with no evidence of dilitation. Pulmonary Artery: The pulmonary artery is not well seen. Venous: The inferior vena cava is dilated in size with less than 50% respiratory variability, suggesting right atrial pressure of 15 mmHg. IAS/Shunts: The interatrial septum was not well visualized. Additional Comments: There is a small pleural effusion in the left lateral region.  LEFT VENTRICLE PLAX 2D LVIDd:         4.40 cm   Diastology LVIDs:         2.90 cm   LV e' medial:    8.16 cm/s LV PW:         1.10 cm   LV E/e' medial:  15.2 LV IVS:        0.90 cm   LV e' lateral:   10.20 cm/s LVOT diam:     2.40 cm   LV E/e' lateral: 12.2 LV SV:         65 LV SV Index:   31 LVOT Area:     4.52 cm  RIGHT VENTRICLE             IVC RV S prime:     12.60 cm/s  IVC diam: 3.15 cm TAPSE (M-mode): 1.8 cm LEFT ATRIUM             Index        RIGHT ATRIUM           Index LA diam:        5.10 cm 2.45 cm/m   RA Area:     20.40 cm LA Vol (A2C):   56.7 ml 27.26 ml/m  RA Volume:   63.30 ml  30.44 ml/m LA Vol (A4C):   58.0 ml 27.89 ml/m LA Biplane Vol: 61.6 ml 29.62 ml/m  AORTIC VALVE AV Area (Vmax): 3.64 cm AV Vmax:        96.50 cm/s AV Peak Grad:   3.7 mmHg LVOT Vmax:      77.60 cm/s LVOT  Vmean:     49.300 cm/s LVOT VTI:       0.143 m  AORTA Ao Root diam: 3.70 cm Ao Asc diam:  3.50 cm MITRAL VALVE                TRICUSPID VALVE MV Area (PHT): 5.02 cm     TR Peak grad:   20.6 mmHg MV Decel Time: 151 msec     TR Vmax:        227.00 cm/s MR Peak grad: 80.3 mmHg MR Vmax:      448.00 cm/s   SHUNTS MV E velocity: 124.00 cm/s  Systemic VTI:  0.14 m MV A velocity: 36.20 cm/s   Systemic Diam: 2.40 cm MV E/A ratio:  3.43 Sammy Crisp MD Electronically signed by Sammy Crisp MD Signature Date/Time: 09/12/2023/4:56:25 PM    Final    DG Chest Port 1 View Result Date: 09/09/2023 CLINICAL DATA:  Cough EXAM: PORTABLE CHEST 1 VIEW COMPARISON:  Chest radiograph dated 09/05/2023 FINDINGS: Normal lung volumes. Increased mild patchy right basilar and left upper lung opacities and dense left retrocardiac opacity. Increased small left pleural effusion. Similar trace right pleural effusion. No pneumothorax. Similar enlarged cardiomediastinal silhouette. No acute osseous abnormality. Round metallic BB is again seen projecting over the neck. IMPRESSION: 1. Increased mild patchy right basilar and left upper lung opacities and dense left retrocardiac opacity, which may represent atelectasis or pneumonia. 2. Increased small left pleural effusion. Similar trace right pleural effusion. Electronically Signed   By: Limin  Xu M.D.   On: 09/09/2023 13:20   CT HEAD WO CONTRAST ( ) Result Date: 09/05/2023 CLINICAL DATA:  Fall, head injury EXAM: CT HEAD WITHOUT CONTRAST CT MAXILLOFACIAL WITHOUT CONTRAST CT CERVICAL SPINE WITHOUT CONTRAST TECHNIQUE: Multidetector CT imaging of the head, cervical spine, and maxillofacial structures were performed using the standard protocol without intravenous contrast. Multiplanar CT image reconstructions of the cervical spine and maxillofacial structures were also generated. RADIATION DOSE REDUCTION: This exam was performed according to the departmental dose-optimization program which  includes automated exposure control, adjustment of the mA and/or kV according to patient size and/or use of iterative reconstruction technique. COMPARISON:  12/03/2020 FINDINGS: CT HEAD FINDINGS Brain: No evidence of acute infarction, hemorrhage, hydrocephalus, extra-axial collection or mass lesion/mass effect. Periventricular white matter hypodensity. Vascular: No hyperdense vessel or unexpected calcification. CT FACIAL BONES FINDINGS Skull: Normal. Negative for fracture or focal lesion. Facial bones: No displaced fractures or dislocations. Sinuses/Orbits: No acute finding. Other: Multiple soft tissue contusions, including of the right forehead, overlying the right orbit, and right cheek, as well as the scalp vertex and right parietal scalp (series 2, image 18). Patient is edentulous. CT CERVICAL SPINE FINDINGS Alignment: Straightening of the normal cervical lordosis. Skull base and vertebrae: No acute fracture. No primary bone lesion or focal pathologic process. Soft tissues and spinal canal: No prevertebral fluid or swelling. No visible canal hematoma. Disc levels: Moderate multilevel cervical disc degenerative disease. Upper chest: Negative. Other: None. IMPRESSION: 1. No acute intracranial pathology. Small-vessel white matter disease. 2. No displaced fractures or dislocations of the facial bones. 3. Multiple soft tissue contusions, including of the right forehead, overlying the right orbit,  and right cheek, as well as the scalp vertex and right parietal scalp. 4. No fracture or static subluxation of the cervical spine. 5. Moderate multilevel cervical disc degenerative disease. Electronically Signed   By: Fredricka Jenny M.D.   On: 09/05/2023 19:54   CT Maxillofacial Wo Contrast Result Date: 09/05/2023 CLINICAL DATA:  Fall, head injury EXAM: CT HEAD WITHOUT CONTRAST CT MAXILLOFACIAL WITHOUT CONTRAST CT CERVICAL SPINE WITHOUT CONTRAST TECHNIQUE: Multidetector CT imaging of the head, cervical spine, and  maxillofacial structures were performed using the standard protocol without intravenous contrast. Multiplanar CT image reconstructions of the cervical spine and maxillofacial structures were also generated. RADIATION DOSE REDUCTION: This exam was performed according to the departmental dose-optimization program which includes automated exposure control, adjustment of the mA and/or kV according to patient size and/or use of iterative reconstruction technique. COMPARISON:  12/03/2020 FINDINGS: CT HEAD FINDINGS Brain: No evidence of acute infarction, hemorrhage, hydrocephalus, extra-axial collection or mass lesion/mass effect. Periventricular white matter hypodensity. Vascular: No hyperdense vessel or unexpected calcification. CT FACIAL BONES FINDINGS Skull: Normal. Negative for fracture or focal lesion. Facial bones: No displaced fractures or dislocations. Sinuses/Orbits: No acute finding. Other: Multiple soft tissue contusions, including of the right forehead, overlying the right orbit, and right cheek, as well as the scalp vertex and right parietal scalp (series 2, image 18). Patient is edentulous. CT CERVICAL SPINE FINDINGS Alignment: Straightening of the normal cervical lordosis. Skull base and vertebrae: No acute fracture. No primary bone lesion or focal pathologic process. Soft tissues and spinal canal: No prevertebral fluid or swelling. No visible canal hematoma. Disc levels: Moderate multilevel cervical disc degenerative disease. Upper chest: Negative. Other: None. IMPRESSION: 1. No acute intracranial pathology. Small-vessel white matter disease. 2. No displaced fractures or dislocations of the facial bones. 3. Multiple soft tissue contusions, including of the right forehead, overlying the right orbit, and right cheek, as well as the scalp vertex and right parietal scalp. 4. No fracture or static subluxation of the cervical spine. 5. Moderate multilevel cervical disc degenerative disease. Electronically Signed    By: Fredricka Jenny M.D.   On: 09/05/2023 19:54   CT Cervical Spine Wo Contrast Result Date: 09/05/2023 CLINICAL DATA:  Fall, head injury EXAM: CT HEAD WITHOUT CONTRAST CT MAXILLOFACIAL WITHOUT CONTRAST CT CERVICAL SPINE WITHOUT CONTRAST TECHNIQUE: Multidetector CT imaging of the head, cervical spine, and maxillofacial structures were performed using the standard protocol without intravenous contrast. Multiplanar CT image reconstructions of the cervical spine and maxillofacial structures were also generated. RADIATION DOSE REDUCTION: This exam was performed according to the departmental dose-optimization program which includes automated exposure control, adjustment of the mA and/or kV according to patient size and/or use of iterative reconstruction technique. COMPARISON:  12/03/2020 FINDINGS: CT HEAD FINDINGS Brain: No evidence of acute infarction, hemorrhage, hydrocephalus, extra-axial collection or mass lesion/mass effect. Periventricular white matter hypodensity. Vascular: No hyperdense vessel or unexpected calcification. CT FACIAL BONES FINDINGS Skull: Normal. Negative for fracture or focal lesion. Facial bones: No displaced fractures or dislocations. Sinuses/Orbits: No acute finding. Other: Multiple soft tissue contusions, including of the right forehead, overlying the right orbit, and right cheek, as well as the scalp vertex and right parietal scalp (series 2, image 18). Patient is edentulous. CT CERVICAL SPINE FINDINGS Alignment: Straightening of the normal cervical lordosis. Skull base and vertebrae: No acute fracture. No primary bone lesion or focal pathologic process. Soft tissues and spinal canal: No prevertebral fluid or swelling. No visible canal hematoma. Disc levels: Moderate multilevel cervical disc degenerative disease.  Upper chest: Negative. Other: None. IMPRESSION: 1. No acute intracranial pathology. Small-vessel white matter disease. 2. No displaced fractures or dislocations of the facial  bones. 3. Multiple soft tissue contusions, including of the right forehead, overlying the right orbit, and right cheek, as well as the scalp vertex and right parietal scalp. 4. No fracture or static subluxation of the cervical spine. 5. Moderate multilevel cervical disc degenerative disease. Electronically Signed   By: Fredricka Jenny M.D.   On: 09/05/2023 19:54   DG Chest Portable 1 View Result Date: 09/05/2023 CLINICAL DATA:  fall EXAM: PORTABLE CHEST - 1 VIEW COMPARISON:  12/03/2020 FINDINGS: New left retrocardiac opacity.  Right lung clear. Heart size and mediastinal contours are within normal limits. Aortic Atherosclerosis (ICD10-170.0). No effusion. Old right lateral rib fracture deformities. 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 IMPRESSION: New left retrocardiac opacity. Electronically Signed   By: Nicoletta Barrier M.D.   On: 09/05/2023 19:53   DG Elbow 2 Views Right Result Date: 09/05/2023 CLINICAL DATA:  Recent fall with right elbow pain, initial encounter EXAM: RIGHT ELBOW - 2 VIEW COMPARISON:  None Available. FINDINGS: There is no evidence of fracture, dislocation, or joint effusion. There is no evidence of arthropathy or other focal bone abnormality. Soft tissues are unremarkable. IMPRESSION: No acute abnormality noted. Electronically Signed   By: Violeta Grey M.D.   On: 09/05/2023 19:47      Subjective: Seen and examined on day of discharge.  Stable, appropriate for dispo to SNF.  Discharge Exam: Vitals:   09/13/23 0429 09/13/23 0743  BP: 134/85 126/80  Pulse: 73 72  Resp: 18 18  Temp: (!) 97.5 F (36.4 C) 98.5 F (36.9 C)  SpO2: 95% 96%   Vitals:   09/12/23 2018 09/13/23 0429 09/13/23 0500 09/13/23 0743  BP: 106/78 134/85  126/80  Pulse: 73 73  72  Resp: 19 18  18   Temp: 98.9 F (37.2 C) (!) 97.5 F (36.4 C)  98.5 F (36.9 C)  TempSrc: Oral Oral  Oral  SpO2: 94% 95%  96%  Weight:   86.1 kg   Height:        General: Pt is alert, awake, not in acute  distress Cardiovascular: RRR, S1/S2 +, no rubs, no gallops Respiratory: CTA bilaterally, no wheezing, no rhonchi Abdominal: Soft, NT, ND, bowel sounds + Extremities: no edema, no cyanosis    The results of significant diagnostics from this hospitalization (including imaging, microbiology, ancillary and laboratory) are listed below for reference.     Microbiology: Recent Results (from the past 240 hours)  MRSA Next Gen by PCR, Nasal     Status: None   Collection Time: 09/05/23  8:25 PM   Specimen: Nasal Mucosa; Nasal Swab  Result Value Ref Range Status   MRSA by PCR Next Gen NOT DETECTED NOT DETECTED Final    Comment: (NOTE) The GeneXpert MRSA Assay (FDA approved for NASAL specimens only), is one component of a comprehensive MRSA colonization surveillance program. It is not intended to diagnose MRSA infection nor to guide or monitor treatment for MRSA infections. Test performance is not FDA approved in patients less than 79 years old. Performed at [facility], [address] [place], [place] [address]   Culture, blood (Routine X 2) w Reflex to ID Panel     Status: None   Collection Time: 09/06/23  1:12 AM   Specimen: BLOOD  Result Value Ref Range Status   Specimen Description BLOOD BLOOD LEFT ARM  Final   Special Requests   Final    BOTTLES DRAWN AEROBIC AND ANAEROBIC Blood Culture adequate volume   Culture   Final    NO GROWTH 5 DAYS Performed at [facility], [address] [place], [place] [address]    Report Status 09/11/2023 FINAL  Final  Culture, blood (Routine X 2) w Reflex to ID Panel     Status: None   Collection Time: 09/06/23  1:12 AM   Specimen: BLOOD  Result Value Ref Range Status   Specimen Description BLOOD BLOOD LEFT ARM  Final   Special Requests Blood Culture adequate volume  Final   Culture   Final    NO GROWTH 5 DAYS Performed at [facility], [address], [place], [place] [address]    Report Status 09/11/2023  FINAL  Final     Labs: BNP (last 3 results) No results for input(s): BNP in the last 8760 hours. Basic Metabolic Panel: Recent Labs  Lab 09/07/23 0337 09/07/23 0809 09/08/23 0318 09/08/23 0824 09/09/23 0422 09/09/23 0742 09/10/23 1438 09/10/23 2237 09/11/23 0557 09/11/23 1100 09/11/23 2307 09/12/23 1146 09/12/23 2251  NA 115*   < > 120*   < > 125*   < >  --    < > 126* 126* 126* 126* 124*  K 3.3*  --  3.3*  --  3.2*  --  4.2  --  4.2  --   --   --   --   CL 82*  --  86*  --  92*  --   --   --  93*  --   --   --   --   CO2 25  --  23  --  28  --   --   --  24  --   --   --   --   GLUCOSE 90  --  94  --  123*  --   --   --  122*  --   --   --   --   BUN <5*  --  <5*  --  <5*  --   --   --  7*  --   --   --   --   CREATININE 0.50*  --  0.49*  --  0.44*  --   --   --  0.45*  --   --   --   --   CALCIUM  7.5*  --  7.5*  --  7.5*  --   --   --  7.8*  --   --   --   --   MG 1.9  --  2.0  --  1.7  --  1.9  --  1.9  --   --   --   --   PHOS 2.0*  --  2.1*  --  2.8  --   --   --   --   --   --   --   --    < > = values in this interval not displayed.   Liver Function Tests: No results for input(s): AST, ALT, ALKPHOS, BILITOT, PROT, ALBUMIN in the last 168 hours. No results for input(s): LIPASE, AMYLASE in the last 168 hours. No results for input(s): AMMONIA in the last 168 hours. CBC: Recent Labs  Lab 09/08/23 0318 09/10/23 1438 09/11/23 1412 09/12/23 0052 09/13/23 0327  WBC 9.7 9.0 9.7 9.2 8.4  NEUTROABS 6.9  --   --   --   --  HGB 11.5* 9.9* 10.8* 9.5* 9.4*  HCT 31.9* 28.3* 32.4* 28.2* 27.9*  MCV 84.8 88.2 92.3 89.2 89.4  PLT 321 298 260 307 327   Cardiac Enzymes: No results for input(s): CKTOTAL, CKMB, CKMBINDEX, TROPONINI in the last 168 hours. BNP: Invalid input(s): POCBNP CBG: Recent Labs  Lab 09/10/23 2031 09/10/23 2357 09/11/23 0333 09/11/23 0752 09/12/23 2047  GLUCAP 142* 131* 124* 117* 126*   D-Dimer No results for  input(s): DDIMER in the last 72 hours. Hgb A1c No results for input(s): HGBA1C in the last 72 hours. Lipid Profile No results for input(s): CHOL, HDL, LDLCALC, TRIG, CHOLHDL, LDLDIRECT in the last 72 hours. Thyroid function studies Recent Labs    09/10/23 1438  TSH 1.259   Anemia work up No results for input(s): VITAMINB12, FOLATE, FERRITIN, TIBC, IRON, RETICCTPCT in the last 72 hours. Urinalysis    Component Value Date/Time   COLORURINE YELLOW (A) 09/05/2023 1905   APPEARANCEUR CLEAR (A) 09/05/2023 1905   APPEARANCEUR Clear 01/10/2012 1248   LABSPEC 1.020 09/05/2023 1905   LABSPEC 1.008 01/10/2012 1248   PHURINE 6.0 09/05/2023 1905   GLUCOSEU 150 (A) 09/05/2023 1905   GLUCOSEU Negative 01/10/2012 1248   HGBUR SMALL (A) 09/05/2023 1905   BILIRUBINUR NEGATIVE 09/05/2023 1905   BILIRUBINUR Negative 01/10/2012 1248   KETONESUR 80 (A) 09/05/2023 1905   PROTEINUR 30 (A) 09/05/2023 1905   NITRITE NEGATIVE 09/05/2023 1905   LEUKOCYTESUR NEGATIVE 09/05/2023 1905   LEUKOCYTESUR Negative 01/10/2012 1248   Sepsis Labs Recent Labs  Lab 09/10/23 1438 09/11/23 1412 09/12/23 0052 09/13/23 0327  WBC 9.0 9.7 9.2 8.4   Microbiology Recent Results (from the past 240 hours)  MRSA Next Gen by PCR, Nasal     Status: None   Collection Time: 09/05/23  8:25 PM   Specimen: Nasal Mucosa; Nasal Swab  Result Value Ref Range Status   MRSA by PCR Next Gen NOT DETECTED NOT DETECTED Final    Comment: (NOTE) The GeneXpert MRSA Assay (FDA approved for NASAL specimens only), is one component of a comprehensive MRSA colonization surveillance program. It is not intended to diagnose MRSA infection nor to guide or monitor treatment for MRSA infections. Test performance is not FDA approved in patients less than 41 years old. Performed at [facility], [address] [place], [place] [address]   Culture, blood (Routine X 2) w Reflex to ID Panel     Status: None    Collection Time: 09/06/23  1:12 AM   Specimen: BLOOD  Result Value Ref Range Status   Specimen Description BLOOD BLOOD LEFT ARM  Final   Special Requests   Final    BOTTLES DRAWN AEROBIC AND ANAEROBIC Blood Culture adequate volume   Culture   Final    NO GROWTH 5 DAYS Performed at [facility], [address] [place], [place] [address]    Report Status 09/11/2023 FINAL  Final  Culture, blood (Routine X 2) w Reflex to ID Panel     Status: None   Collection Time: 09/06/23  1:12 AM   Specimen: BLOOD  Result Value Ref Range Status   Specimen Description BLOOD BLOOD LEFT ARM  Final   Special Requests Blood Culture adequate volume  Final   Culture   Final    NO GROWTH 5 DAYS Performed at [facility], [address] [place], [place] [address]    Report Status 09/11/2023 FINAL  Final     Time coordinating discharge: Over 30 minutes  SIGNED:   Tiajuana Fluke, MD  [facility] 09/13/2023, 11:18 AM Pager   If 7PM-7AM, please contact night-coverage

## 2023-09-13 NOTE — Progress Notes (Signed)
 Nutrition Follow-up  DOCUMENTATION CODES:   Non-severe (moderate) malnutrition in context of social or environmental circumstances  INTERVENTION:   -Continue dysphagia 3 diet -Continue Ensure Plus High Protein po TID, each supplement provides 350 kcal and 20 grams of protein  -Continue MVI with minerals daily -Continue 1 mg folic acid  daily -Continue 100 mg thiamine  daily  NUTRITION DIAGNOSIS:   Moderate Malnutrition related to social / environmental circumstances as evidenced by moderate fat depletion, moderate muscle depletion.  Ongoing  GOAL:   Patient will meet greater than or equal to 90% of their needs  Progressing   MONITOR:   PO intake, Supplement acceptance, Labs, Weight trends, I & O's, Skin  REASON FOR ASSESSMENT:   Consult Assessment of nutrition requirement/status  ASSESSMENT:   68 y/o male with h/o etoh abuse, tobacco use, hypertension, chronic hyponatremia and recurrent falls who is admitted with falls, rhabdomyolsis, new Afib, hyponatremia, dehydration and alcoholic hepatitis.  6/12- s/p BSE- dysphagia 3 diet with thin liquids  Reviewed I/O's: -550 ml x 24 hours and +1.8 L since admission  UOP: 550 ml x 24 hours   Spoke with pt at bedside, who reports feeling better today. He reports he ate some eggs for breakfast. He is tolerating dysphagia 3 diet well. Noted meal completions 25-85%. Noted teo unopened Ensure supplements on tray table. Pt reports they are okay; RD discussed importance of good meal and supplement intake to promote healing. Reviewed other supplement options, but pt reports that he will drink Ensure once educated on importance.   No wt loss noted over the past week.   Per TOC, plan to discharge to SNF ([facility]) once insurance authorization has been obtained.   Medications reviewed and include vitamin C , lovenox , lopressor, protonix , sodium chloride , and thiamine .   Labs reviewed: Na: 124, CBGS: 126 (inpatient orders for  glycemic control are none).    Diet Order:   Diet Order             Diet - low sodium heart healthy           DIET DYS 3 Room service appropriate? No; Fluid consistency: Thin; Fluid restriction: 1500 mL Fluid  Diet effective now                   EDUCATION NEEDS:   Education needs have been addressed  Skin:  Skin Assessment: Skin Integrity Issues: Skin Integrity Issues:: Stage III, Stage II Stage II: rt vertebeal column Stage III: rt elbow  Last BM:  09/12/23 (type 6)  Height:   Ht Readings from Last 1 Encounters:  09/05/23 6' (1.829 m)    Weight:   Wt Readings from Last 1 Encounters:  09/13/23 86.1 kg    Ideal Body Weight:  80.9 kg  BMI:  Body mass index is 25.74 kg/m.  Estimated Nutritional Needs:   Kcal:  2100-2400kcal/day  Protein:  105-120g/day  Fluid:  2.1-2.4L/day    Herschel Lords, RD, LDN, CDCES Registered Dietitian III Certified Diabetes Care and Education Specialist If unable to reach this RD, please use RD Inpatient group chat on secure chat between hours of 8am-4 pm daily

## 2023-09-13 NOTE — Progress Notes (Signed)
 [facility]  ROUNDING NOTE   Subjective:   68 year old male with hypertension, alcohol abuse, chronic hyponatremia, admitted for acute metabolic encephalopathy.  Sodium today is slightly lower at 124.  However patient has good urine output.   Objective:  Vital signs in last 24 hours:  Temp:  [97.5 F (36.4 C)-98.9 F (37.2 C)] 98.5 F (36.9 C) (06/17 0743) Pulse Rate:  [69-73] 72 (06/17 0743) Resp:  [18-19] 18 (06/17 0743) BP: (91-134)/(36-85) 126/80 (06/17 0743) SpO2:  [94 %-96 %] 96 % (06/17 0743) Weight:  [86.1 kg] 86.1 kg (06/17 0500)  Weight change: 0.4 kg Filed Weights   09/11/23 0400 09/12/23 0500 09/13/23 0500  Weight: 87.3 kg 85.7 kg 86.1 kg    Intake/Output: I/O last 3 completed shifts: In: 796.2 [P.O.:240; I.V.:556.2] Out: 1450 [Urine:1450]   Intake/Output this shift:  Total I/O In: -  Out: 1750 [Urine:1750]  Physical Exam: General: NAD,   Head: Normocephalic, atraumatic. Moist oral mucosal membranes  Eyes: Anicteric, right periorbital ecchymosis  Neck: Supple,   Lungs:  Diminished, 3L [place]  Heart: Regular rate and rhythm  Abdomen:  Soft, nontender,   Extremities: Dependent peripheral edema.  Neurologic: Nonfocal, moving all four extremities          Basic Metabolic Panel: Recent Labs  Lab 09/07/23 0337 09/07/23 0809 09/08/23 0318 09/08/23 0824 09/09/23 0422 09/09/23 0742 09/10/23 1438 09/10/23 2237 09/11/23 0557 09/11/23 1100 09/11/23 2307 09/12/23 1146 09/12/23 2251 09/13/23 1123  NA 115*   < > 120*   < > 125*   < >  --    < > 126* 126* 126* 126* 124* 124*  K 3.3*  --  3.3*  --  3.2*  --  4.2  --  4.2  --   --   --   --   --   CL 82*  --  86*  --  92*  --   --   --  93*  --   --   --   --   --   CO2 25  --  23  --  28  --   --   --  24  --   --   --   --   --   GLUCOSE 90  --  94  --  123*  --   --   --  122*  --   --   --   --   --   BUN <5*  --  <5*  --  <5*  --   --   --  7*  --   --   --   --   --   CREATININE  0.50*  --  0.49*  --  0.44*  --   --   --  0.45*  --   --   --   --   --   CALCIUM  7.5*  --  7.5*  --  7.5*  --   --   --  7.8*  --   --   --   --   --   MG 1.9  --  2.0  --  1.7  --  1.9  --  1.9  --   --   --   --   --   PHOS 2.0*  --  2.1*  --  2.8  --   --   --   --   --   --   --   --   --    < > =  values in this interval not displayed.    Liver Function Tests: No results for input(s): AST, ALT, ALKPHOS, BILITOT, PROT, ALBUMIN in the last 168 hours.  No results for input(s): LIPASE, AMYLASE in the last 168 hours. No results for input(s): AMMONIA in the last 168 hours.  CBC: Recent Labs  Lab 09/08/23 0318 09/10/23 1438 09/11/23 1412 09/12/23 0052 09/13/23 0327  WBC 9.7 9.0 9.7 9.2 8.4  NEUTROABS 6.9  --   --   --   --   HGB 11.5* 9.9* 10.8* 9.5* 9.4*  HCT 31.9* 28.3* 32.4* 28.2* 27.9*  MCV 84.8 88.2 92.3 89.2 89.4  PLT 321 298 260 307 327    Cardiac Enzymes: No results for input(s): CKTOTAL, CKMB, CKMBINDEX, TROPONINI in the last 168 hours.   BNP: Invalid input(s): POCBNP  CBG: Recent Labs  Lab 09/10/23 2031 09/10/23 2357 09/11/23 0333 09/11/23 0752 09/12/23 2047  GLUCAP 142* 131* 124* 117* 126*    Microbiology: Results for orders placed or performed during the hospital encounter of 09/05/23  MRSA Next Gen by PCR, Nasal     Status: None   Collection Time: 09/05/23  8:25 PM   Specimen: Nasal Mucosa; Nasal Swab  Result Value Ref Range Status   MRSA by PCR Next Gen NOT DETECTED NOT DETECTED Final    Comment: (NOTE) The GeneXpert MRSA Assay (FDA approved for NASAL specimens only), is one component of a comprehensive MRSA colonization surveillance program. It is not intended to diagnose MRSA infection nor to guide or monitor treatment for MRSA infections. Test performance is not FDA approved in patients less than 21 years old. Performed at [facility], [address] [place], [place] [address]   Culture, blood  (Routine X 2) w Reflex to ID Panel     Status: None   Collection Time: 09/06/23  1:12 AM   Specimen: BLOOD  Result Value Ref Range Status   Specimen Description BLOOD BLOOD LEFT ARM  Final   Special Requests   Final    BOTTLES DRAWN AEROBIC AND ANAEROBIC Blood Culture adequate volume   Culture   Final    NO GROWTH 5 DAYS Performed at [facility], [address] [place], [place] [address]    Report Status 09/11/2023 FINAL  Final  Culture, blood (Routine X 2) w Reflex to ID Panel     Status: None   Collection Time: 09/06/23  1:12 AM   Specimen: BLOOD  Result Value Ref Range Status   Specimen Description BLOOD BLOOD LEFT ARM  Final   Special Requests Blood Culture adequate volume  Final   Culture   Final    NO GROWTH 5 DAYS Performed at [facility] [address], [place], [place] [address]    Report Status 09/11/2023 FINAL  Final    Coagulation Studies: No results for input(s): LABPROT, INR in the last 72 hours.  Urinalysis: No results for input(s): COLORURINE, LABSPEC, PHURINE, GLUCOSEU, HGBUR, BILIRUBINUR, KETONESUR, PROTEINUR, UROBILINOGEN, NITRITE, LEUKOCYTESUR in the last 72 hours.  Invalid input(s): APPERANCEUR    Imaging: DG Chest Port 1 View Result Date: 09/13/2023 CLINICAL DATA:  Hypoxia EXAM: PORTABLE CHEST 1 VIEW COMPARISON:  09/09/2023. FINDINGS: Left base consolidation or volume loss with moderate effusion. Pulmonary vascular congestion. Left upper hemithorax alveolar opacity could represent early pneumonia or atelectasis. Aorta is calcified. Old right sided 6-8th rib fractures. IMPRESSION: 1. Left base consolidation or volume loss with effusion. 2. Left upper hemithorax opacity could represent early pneumonia or atelectasis.  Electronically Signed   By: Sydell Eva M.D.   On: 09/13/2023 12:11   ECHOCARDIOGRAM COMPLETE Result Date: 09/12/2023    ECHOCARDIOGRAM REPORT   Patient Name:   Barry Fowler Date of Exam:  09/12/2023 Medical Rec #:  [id number]   Height:       72.0 in Accession #:    [id number]  Weight:       188.9 lb Date of Birth:  08-15-1955   BSA:          2.080 m Patient Age:    67 years    BP:           128/74 mmHg Patient Gender: M           HR:           75 bpm. Exam Location:  [facility] Procedure: 2D Echo, Cardiac Doppler and Color Doppler (Both Spectral and Color            Flow Doppler were utilized during procedure). Indications:     Atrial Fibrillation I48.91  History:         Patient has no prior history of Echocardiogram examinations.                  Arrythmias:Atrial Fibrillation.  Sonographer:     Terrilee Few RCS Referring Phys:  [id number] Tiajuana Fluke Diagnosing Phys: Sammy Crisp MD IMPRESSIONS  1. Left ventricular ejection fraction, by estimation, is 55 to 60%. The left ventricle has normal function. Left ventricular endocardial border not optimally defined to evaluate regional wall motion. Left ventricular diastolic parameters were normal.  2. Right ventricular systolic function is low normal. The right ventricular size is normal. Mildly increased right ventricular wall thickness. There is mildly elevated pulmonary artery systolic pressure. The estimated right ventricular systolic pressure  is 35.6 mmHg.  3. Right atrial size was mildly dilated.  4. Moderate pericardial effusion. The pericardial effusion is circumferential. There is no evidence of cardiac tamponade.  5. The mitral valve is normal in structure. Mild mitral valve regurgitation. No evidence of mitral stenosis.  6. Tricuspid valve regurgitation is mild to moderate.  7. The aortic valve is tricuspid. There is mild thickening of the aortic valve. Aortic valve regurgitation is not visualized. Aortic valve sclerosis is present, with no evidence of aortic valve stenosis.  8. The inferior vena cava is dilated in size with <50% respiratory variability, suggesting right atrial pressure of 15 mmHg. FINDINGS  Left Ventricle: Left  ventricular ejection fraction, by estimation, is 55 to 60%. The left ventricle has normal function. Left ventricular endocardial border not optimally defined to evaluate regional wall motion. The left ventricular internal cavity size was normal in size. There is borderline left ventricular hypertrophy. Left ventricular diastolic parameters were normal. Right Ventricle: The right ventricular size is normal. Mildly increased right ventricular wall thickness. Right ventricular systolic function is low normal. There is mildly elevated pulmonary artery systolic pressure. The tricuspid regurgitant velocity is 2.27 m/s, and with an assumed right atrial pressure of 15 mmHg, the estimated right ventricular systolic pressure is 35.6 mmHg. Left Atrium: Left atrial size was normal in size. Right Atrium: Right atrial size was mildly dilated. Pericardium: A moderately sized pericardial effusion is present. The pericardial effusion is circumferential. There is no evidence of cardiac tamponade. Mitral Valve: The mitral valve is normal in structure. Mild mitral valve regurgitation. No evidence of mitral valve stenosis. Tricuspid Valve: The tricuspid valve is normal in structure. Tricuspid valve regurgitation is mild  to moderate. Aortic Valve: The aortic valve is tricuspid. There is mild thickening of the aortic valve. There is mild aortic valve annular calcification. Aortic valve regurgitation is not visualized. Aortic valve sclerosis is present, with no evidence of aortic valve  stenosis. Aortic valve peak gradient measures 3.7 mmHg. Pulmonic Valve: The pulmonic valve was not well visualized. Pulmonic valve regurgitation is trivial. No evidence of pulmonic stenosis. Aorta: The aortic root and ascending aorta are structurally normal, with no evidence of dilitation. Pulmonary Artery: The pulmonary artery is not well seen. Venous: The inferior vena cava is dilated in size with less than 50% respiratory variability, suggesting right  atrial pressure of 15 mmHg. IAS/Shunts: The interatrial septum was not well visualized. Additional Comments: There is a small pleural effusion in the left lateral region.  LEFT VENTRICLE PLAX 2D LVIDd:         4.40 cm   Diastology LVIDs:         2.90 cm   LV e' medial:    8.16 cm/s LV PW:         1.10 cm   LV E/e' medial:  15.2 LV IVS:        0.90 cm   LV e' lateral:   10.20 cm/s LVOT diam:     2.40 cm   LV E/e' lateral: 12.2 LV SV:         65 LV SV Index:   31 LVOT Area:     4.52 cm  RIGHT VENTRICLE             IVC RV S prime:     12.60 cm/s  IVC diam: 3.15 cm TAPSE (M-mode): 1.8 cm LEFT ATRIUM             Index        RIGHT ATRIUM           Index LA diam:        5.10 cm 2.45 cm/m   RA Area:     20.40 cm LA Vol (A2C):   56.7 ml 27.26 ml/m  RA Volume:   63.30 ml  30.44 ml/m LA Vol (A4C):   58.0 ml 27.89 ml/m LA Biplane Vol: 61.6 ml 29.62 ml/m  AORTIC VALVE AV Area (Vmax): 3.64 cm AV Vmax:        96.50 cm/s AV Peak Grad:   3.7 mmHg LVOT Vmax:      77.60 cm/s LVOT Vmean:     49.300 cm/s LVOT VTI:       0.143 m  AORTA Ao Root diam: 3.70 cm Ao Asc diam:  3.50 cm MITRAL VALVE                TRICUSPID VALVE MV Area (PHT): 5.02 cm     TR Peak grad:   20.6 mmHg MV Decel Time: 151 msec     TR Vmax:        227.00 cm/s MR Peak grad: 80.3 mmHg MR Vmax:      448.00 cm/s   SHUNTS MV E velocity: 124.00 cm/s  Systemic VTI:  0.14 m MV A velocity: 36.20 cm/s   Systemic Diam: 2.40 cm MV E/A ratio:  3.43 Christopher End MD Electronically signed by Sammy Crisp MD Signature Date/Time: 09/12/2023/4:56:25 PM    Final      Medications:      vitamin C   500 mg Oral BID   enoxaparin  (LOVENOX ) injection  40 mg Subcutaneous Q24H   feeding supplement  237 mL Oral  TID BM   folic acid   1 mg Oral Daily   guaiFENesin   600 mg Oral BID   metoprolol tartrate  25 mg Oral BID   multivitamin with minerals  1 tablet Oral Daily   mouth rinse  15 mL Mouth Rinse 4 times per day   pantoprazole   40 mg Oral Daily   sodium chloride   1  g Oral TID WC   thiamine   100 mg Oral Daily   alum & mag hydroxide-simeth, chlorpheniramine-HYDROcodone, docusate sodium , LORazepam , mouth rinse, polyethylene glycol  Assessment/ Plan:  [name] is a 68 y.o.  male with past medical conditions including hypertension, alcohol and tobacco abuse, and hyponatremia, who was admitted to [facility] on 09/05/2023 for Hyponatremia [E87.1] Acute metabolic encephalopathy [G93.41]      Severe hyponatremia, patient has history of chronic hyponatremia.  Sodium on ED arrival 104.  Likely secondary to [employer].  Stabilizing at 124-126. - Encourage oral protein intake - Continue sodium chloride  tablets; currently on 1 g 3 times per day - Strictly avoid alcohol    LOS: 8 Samiha Denapoli 6/17/20252:00 PM

## 2023-09-13 NOTE — TOC Transition Note (Signed)
 Transition of Care [facility]) - Discharge Note   Patient Details  Name: Barry Fowler MRN: [id number] Date of Birth: November 29, 1955  [facility]) CM/SW Contact:  Alexandra Ice, RN Phone Number: 09/13/2023, 11:55 AM   Clinical Narrative:    Patient to discharge to [facility]. Discharge summary and orders sent to facility via HUB. Spoke to [place] at [facility], scheduled for pick up at 2:00pm. EMS packet printed to nurse station. Patient going to room 805, nurse to call report to [phone]. Notified MD and bedside nurse.    Final next level of care: Skilled Nursing Facility Barriers to Discharge: Barriers Resolved   Patient Goals and CMS Choice   CMS Medicare.gov Compare Post Acute Care list provided to:: Patient Choice offered to / list presented to : Patient      Discharge Placement              Patient chooses bed at: [facility]  Patient to be transferred to facility by: [facility]   Patient and family notified of of transfer: 09/13/23  Discharge Plan and Services Additional resources added to the After Visit Summary for     Discharge Planning Services: CM Consult Post Acute Care Choice: Skilled Nursing Facility            DME Agency: NA       HH Arranged: NA          Social Drivers of Health (SDOH) Interventions SDOH Screenings   Food Insecurity: No Food Insecurity (09/06/2023)  Housing: Low Risk  (09/06/2023)  Transportation Needs: No Transportation Needs (09/06/2023)  Utilities: Not At Risk (09/06/2023)  Social Connections: Unknown (09/06/2023)  Tobacco Use: High Risk (09/05/2023)     Readmission Risk Interventions     No data to display

## 2023-09-13 NOTE — Progress Notes (Signed)
 Progress Note  Patient Name: Barry Fowler Date of Encounter: 09/13/2023  Primary Cardiologist: New - consult by Dr. Theodis Fiscal, MD  Subjective   No chest pain, dyspnea, palpitations, dizziness, presyncope, or syncope.  Reports frequent falls.  Spontaneously converted to sinus rhythm at 2:37 AM on 6/16 and remains in sinus rhythm.  Echo showed an EF of 55 to 60%, normal LV diastolic function, low normal RV systolic function with mildly increased RV wall thickness and mildly elevated RVSP estimated at 35.6 mmHg, mildly dilated right atrium, moderate pericardial effusion that was not circumferential and without evidence of tamponade, mild mitral regurgitation, mild to moderate tricuspid regurgitation, aortic valve sclerosis without evidence of stenosis, and an estimated right atrial pressure of 15 mmHg..  Inpatient Medications    Scheduled Meds:  vitamin C   500 mg Oral BID   enoxaparin  (LOVENOX ) injection  40 mg Subcutaneous Q24H   feeding supplement  237 mL Oral TID BM   folic acid   1 mg Oral Daily   guaiFENesin   600 mg Oral BID   metoprolol tartrate  25 mg Oral BID   multivitamin with minerals  1 tablet Oral Daily   mouth rinse  15 mL Mouth Rinse 4 times per day   pantoprazole   40 mg Oral Daily   sodium chloride   1 g Oral TID WC   thiamine   100 mg Oral Daily   Continuous Infusions:   PRN Meds: alum & mag hydroxide-simeth, chlorpheniramine-HYDROcodone, docusate sodium , LORazepam , mouth rinse, polyethylene glycol   Vital Signs    Vitals:   09/12/23 2018 09/13/23 0429 09/13/23 0500 09/13/23 0743  BP: 106/78 134/85  126/80  Pulse: 73 73  72  Resp: 19 18  18   Temp: 98.9 F (37.2 C) (!) 97.5 F (36.4 C)  98.5 F (36.9 C)  TempSrc: Oral Oral  Oral  SpO2: 94% 95%  96%  Weight:   86.1 kg   Height:        Intake/Output Summary (Last 24 hours) at 09/13/2023 0911 Last data filed at 09/13/2023 0601 Gross per 24 hour  Intake --  Output 250 ml  Net -250 ml   Filed Weights    09/11/23 0400 09/12/23 0500 09/13/23 0500  Weight: 87.3 kg 85.7 kg 86.1 kg    Telemetry    Sinus rhythm with rates in the 60s to 70s bpm - Personally Reviewed  ECG    No new tracings - Personally Reviewed  Physical Exam   GEN: Chronically ill-appearing.  No acute distress.  Contusion noted along the right orbit. Neck: No JVD. Cardiac: RRR, no murmurs, rubs, or gallops.  Respiratory: Clear to auscultation bilaterally.  GI: Soft, nontender, non-distended.   MS: No edema; No deformity. Neuro:  Alert and oriented x 3; Nonfocal.  Psych: Normal affect.  Labs    Chemistry Recent Labs  Lab 09/08/23 0318 09/08/23 0824 09/09/23 0422 09/09/23 0742 09/10/23 1438 09/10/23 2237 09/11/23 0557 09/11/23 1100 09/11/23 2307 09/12/23 1146 09/12/23 2251  NA 120*   < > 125*   < >  --    < > 126*   < > 126* 126* 124*  K 3.3*  --  3.2*  --  4.2  --  4.2  --   --   --   --   CL 86*  --  92*  --   --   --  93*  --   --   --   --   CO2 23  --  28  --   --   --  24  --   --   --   --   GLUCOSE 94  --  123*  --   --   --  122*  --   --   --   --   BUN <5*  --  <5*  --   --   --  7*  --   --   --   --   CREATININE 0.49*  --  0.44*  --   --   --  0.45*  --   --   --   --   CALCIUM  7.5*  --  7.5*  --   --   --  7.8*  --   --   --   --   GFRNONAA >60  --  >60  --   --   --  >60  --   --   --   --   ANIONGAP 11  --  5  --   --   --  9  --   --   --   --    < > = values in this interval not displayed.     Hematology Recent Labs  Lab 09/11/23 1412 09/12/23 0052 09/13/23 0327  WBC 9.7 9.2 8.4  RBC 3.51* 3.16* 3.12*  HGB 10.8* 9.5* 9.4*  HCT 32.4* 28.2* 27.9*  MCV 92.3 89.2 89.4  MCH 30.8 30.1 30.1  MCHC 33.3 33.7 33.7  RDW 14.7 14.6 14.6  PLT 260 307 327    Cardiac EnzymesNo results for input(s): TROPONINI in the last 168 hours. No results for input(s): TROPIPOC in the last 168 hours.   BNPNo results for input(s): BNP, PROBNP in the last 168 hours.   DDimer No results for  input(s): DDIMER in the last 168 hours.   Radiology     Cardiac Studies   2D echo 09/12/2023: 1. Left ventricular ejection fraction, by estimation, is 55 to 60%. The  left ventricle has normal function. Left ventricular endocardial border  not optimally defined to evaluate regional wall motion. Left ventricular  diastolic parameters were normal.   2. Right ventricular systolic function is low normal. The right  ventricular size is normal. Mildly increased right ventricular wall  thickness. There is mildly elevated pulmonary artery systolic pressure.  The estimated right ventricular systolic pressure   is 35.6 mmHg.   3. Right atrial size was mildly dilated.   4. Moderate pericardial effusion. The pericardial effusion is  circumferential. There is no evidence of cardiac tamponade.   5. The mitral valve is normal in structure. Mild mitral valve  regurgitation. No evidence of mitral stenosis.   6. Tricuspid valve regurgitation is mild to moderate.   7. The aortic valve is tricuspid. There is mild thickening of the aortic  valve. Aortic valve regurgitation is not visualized. Aortic valve  sclerosis is present, with no evidence of aortic valve stenosis.   8. The inferior vena cava is dilated in size with <50% respiratory  variability, suggesting right atrial pressure of 15 mmHg.   Patient Profile     68 y.o. male with history of HTN, EtOH abuse, tobacco abuse, chronic hyponatremia, and recurrent falls who is being seen for the evaluation of new onset A-fib.  Assessment & Plan    1.  New onset A-fib: Spontaneously converted to sinus rhythm at 2:37 AM on 09/12/2023, and remains in sinus rhythm.  Continue metoprolol 25 mg twice daily.  Not felt to be a good candidate for  long-term anticoagulation use given frequent falls and EtOH abuse.  Given no acute findings on echo, will discontinue heparin  drip.  May benefit from EP evaluation in the outpatient setting for consideration of Watchman.   Potassium, magnesium , TSH normal.    2.  Pericardial effusion with mild pulmonary hypertension: One-time dose of IV Lasix 20 mg with close monitoring of urine output.  Repeat limited echo in 24 to 48 hours to trend effusion.  2.  Anemia: Denies symptoms concerning for bleeding.  No clear source, though he does have multiple bruises in the setting of recurrent falls.  Continue to monitor on heparin  drip with low threshold to discontinue.  Ongoing management per IM.       For questions or updates, please contact [facility] Please consult [email or web address] for contact info under Cardiology/STEMI.    Signed, Varney Gentleman, PA-C [facility] Pager: [phone] 09/13/2023, 9:11 AM

## 2023-09-13 NOTE — Progress Notes (Signed)
 PHARMACY - ANTICOAGULATION CONSULT NOTE  Pharmacy Consult for heparin  infusion Indication: atrial fibrillation  No Known Allergies  Patient Measurements: Height: 6' (182.9 cm) Weight: 85.7 kg (188 lb 15 oz) IBW/kg (Calculated) : 77.6 HEPARIN  DW (KG): 67  Vital Signs: Temp: 97.5 F (36.4 C) (06/17 0429) Temp Source: Oral (06/17 0429) BP: 134/85 (06/17 0429) Pulse Rate: 73 (06/17 0429)  Labs: Recent Labs    09/11/23 0557 09/11/23 1247 09/11/23 1412 09/11/23 2107 09/12/23 0052 09/12/23 0711 09/13/23 0327  HGB  --   --  10.8*  --  9.5*  --  9.4*  HCT  --   --  32.4*  --  28.2*  --  27.9*  PLT  --   --  260  --  307  --  327  HEPARINUNFRC 0.28*   < >  --    < > 0.39 0.43 0.28*  CREATININE 0.45*  --   --   --   --   --   --    < > = values in this interval not displayed.    Estimated Creatinine Clearance: 99.7 mL/min (A) (by C-G formula based on SCr of 0.45 mg/dL (L)).   Medical History: Past Medical History:  Diagnosis Date   Hypertension     Medications:  Scheduled:   vitamin C   500 mg Oral BID   feeding supplement  237 mL Oral TID BM   folic acid   1 mg Oral Daily   guaiFENesin   600 mg Oral BID   heparin   1,000 Units Intravenous Once   metoprolol tartrate  25 mg Oral BID   multivitamin with minerals  1 tablet Oral Daily   mouth rinse  15 mL Mouth Rinse 4 times per day   pantoprazole   40 mg Oral Daily   sodium chloride   1 g Oral TID WC   thiamine   100 mg Oral Daily    Assessment: 68 y.o. male presenting with altered mental status. PMH significant for tobacco use, hypertension, chronic hyponatremia. IV heparin  is being started ISO new onset atrial fibrillation.   Goal of Therapy:  Heparin  level 0.3-0.7 units/ml Monitor platelets by anticoagulation protocol: Yes  06/14 2237 HL 0.24, subtherapeutic 06/15 0557 HL 0.28, subtherapeutic 06/15 2107 HL 0.29, subtherapeutic 06/16 0052 HL 0.39, therapeutic x 1  06/17 0327 HL 0.28, SUBtherapeutic    Plan:   06/17:  HL @ 0327 = 0.28, SUBtherapeutic - Will order heparin  1000 units IV X 1 bolus and increase drip rate to 1650 units/hr - Will recheck HL 6 hrs after rate change  CBC daily while on heparin   Thank you for involving pharmacy in this patient's care.   Ermias Tomeo D  09/13/2023 6:00 AM

## 2023-09-14 ENCOUNTER — Emergency Department

## 2023-09-14 ENCOUNTER — Inpatient Hospital Stay
Admission: EM | Admit: 2023-09-14 | Discharge: 2023-09-23 | DRG: 189 | Disposition: A | Discharge status: Skilled Nursing Facility | Source: Skilled Nursing Facility | Attending: Internal Medicine | Admitting: Internal Medicine

## 2023-09-14 ENCOUNTER — Other Ambulatory Visit: Payer: Self-pay

## 2023-09-14 DIAGNOSIS — R059 Cough, unspecified: Secondary | ICD-10-CM | POA: Diagnosis not present

## 2023-09-14 DIAGNOSIS — E871 Hypo-osmolality and hyponatremia: Secondary | ICD-10-CM | POA: Diagnosis not present

## 2023-09-14 DIAGNOSIS — Z9889 Other specified postprocedural states: Secondary | ICD-10-CM | POA: Diagnosis not present

## 2023-09-14 DIAGNOSIS — Z7401 Bed confinement status: Secondary | ICD-10-CM | POA: Diagnosis not present

## 2023-09-14 DIAGNOSIS — I110 Hypertensive heart disease with heart failure: Secondary | ICD-10-CM | POA: Diagnosis not present

## 2023-09-14 DIAGNOSIS — K5900 Constipation, unspecified: Secondary | ICD-10-CM | POA: Diagnosis not present

## 2023-09-14 DIAGNOSIS — Z743 Need for continuous supervision: Secondary | ICD-10-CM | POA: Diagnosis not present

## 2023-09-14 DIAGNOSIS — I10 Essential (primary) hypertension: Secondary | ICD-10-CM | POA: Diagnosis not present

## 2023-09-14 DIAGNOSIS — R0902 Hypoxemia: Principal | ICD-10-CM

## 2023-09-14 DIAGNOSIS — Z1152 Encounter for screening for COVID-19: Secondary | ICD-10-CM | POA: Diagnosis not present

## 2023-09-14 DIAGNOSIS — Y95 Nosocomial condition: Secondary | ICD-10-CM | POA: Diagnosis present

## 2023-09-14 DIAGNOSIS — I4891 Unspecified atrial fibrillation: Secondary | ICD-10-CM | POA: Diagnosis not present

## 2023-09-14 DIAGNOSIS — R54 Age-related physical debility: Secondary | ICD-10-CM | POA: Diagnosis not present

## 2023-09-14 DIAGNOSIS — R590 Localized enlarged lymph nodes: Secondary | ICD-10-CM | POA: Diagnosis not present

## 2023-09-14 DIAGNOSIS — R2681 Unsteadiness on feet: Secondary | ICD-10-CM | POA: Diagnosis not present

## 2023-09-14 DIAGNOSIS — J9601 Acute respiratory failure with hypoxia: Principal | ICD-10-CM | POA: Diagnosis present

## 2023-09-14 DIAGNOSIS — Z720 Tobacco use: Secondary | ICD-10-CM | POA: Diagnosis present

## 2023-09-14 DIAGNOSIS — J811 Chronic pulmonary edema: Secondary | ICD-10-CM | POA: Diagnosis not present

## 2023-09-14 DIAGNOSIS — E222 Syndrome of inappropriate secretion of antidiuretic hormone: Secondary | ICD-10-CM | POA: Diagnosis not present

## 2023-09-14 DIAGNOSIS — R296 Repeated falls: Secondary | ICD-10-CM | POA: Diagnosis present

## 2023-09-14 DIAGNOSIS — J90 Pleural effusion, not elsewhere classified: Secondary | ICD-10-CM | POA: Diagnosis not present

## 2023-09-14 DIAGNOSIS — I517 Cardiomegaly: Secondary | ICD-10-CM | POA: Diagnosis not present

## 2023-09-14 DIAGNOSIS — M109 Gout, unspecified: Secondary | ICD-10-CM | POA: Diagnosis not present

## 2023-09-14 DIAGNOSIS — I3139 Other pericardial effusion (noninflammatory): Secondary | ICD-10-CM | POA: Diagnosis present

## 2023-09-14 DIAGNOSIS — I272 Pulmonary hypertension, unspecified: Secondary | ICD-10-CM | POA: Diagnosis not present

## 2023-09-14 DIAGNOSIS — Z741 Need for assistance with personal care: Secondary | ICD-10-CM | POA: Diagnosis not present

## 2023-09-14 DIAGNOSIS — I480 Paroxysmal atrial fibrillation: Secondary | ICD-10-CM | POA: Diagnosis not present

## 2023-09-14 DIAGNOSIS — E8770 Fluid overload, unspecified: Secondary | ICD-10-CM | POA: Diagnosis not present

## 2023-09-14 DIAGNOSIS — F1090 Alcohol use, unspecified, uncomplicated: Secondary | ICD-10-CM | POA: Diagnosis not present

## 2023-09-14 DIAGNOSIS — F1010 Alcohol abuse, uncomplicated: Secondary | ICD-10-CM | POA: Diagnosis present

## 2023-09-14 DIAGNOSIS — F17290 Nicotine dependence, other tobacco product, uncomplicated: Secondary | ICD-10-CM | POA: Diagnosis not present

## 2023-09-14 DIAGNOSIS — J189 Pneumonia, unspecified organism: Secondary | ICD-10-CM | POA: Diagnosis present

## 2023-09-14 DIAGNOSIS — R091 Pleurisy: Secondary | ICD-10-CM | POA: Diagnosis not present

## 2023-09-14 DIAGNOSIS — R0602 Shortness of breath: Secondary | ICD-10-CM

## 2023-09-14 DIAGNOSIS — I5032 Chronic diastolic (congestive) heart failure: Secondary | ICD-10-CM | POA: Diagnosis not present

## 2023-09-14 DIAGNOSIS — F17210 Nicotine dependence, cigarettes, uncomplicated: Secondary | ICD-10-CM | POA: Diagnosis present

## 2023-09-14 DIAGNOSIS — J439 Emphysema, unspecified: Secondary | ICD-10-CM | POA: Diagnosis not present

## 2023-09-14 DIAGNOSIS — E569 Vitamin deficiency, unspecified: Secondary | ICD-10-CM | POA: Diagnosis not present

## 2023-09-14 DIAGNOSIS — Z8249 Family history of ischemic heart disease and other diseases of the circulatory system: Secondary | ICD-10-CM

## 2023-09-14 DIAGNOSIS — Z79899 Other long term (current) drug therapy: Secondary | ICD-10-CM

## 2023-09-14 DIAGNOSIS — R918 Other nonspecific abnormal finding of lung field: Secondary | ICD-10-CM | POA: Diagnosis not present

## 2023-09-14 DIAGNOSIS — K219 Gastro-esophageal reflux disease without esophagitis: Secondary | ICD-10-CM | POA: Diagnosis not present

## 2023-09-14 DIAGNOSIS — Z48813 Encounter for surgical aftercare following surgery on the respiratory system: Secondary | ICD-10-CM | POA: Diagnosis not present

## 2023-09-14 DIAGNOSIS — J9811 Atelectasis: Secondary | ICD-10-CM | POA: Diagnosis not present

## 2023-09-14 DIAGNOSIS — E785 Hyperlipidemia, unspecified: Secondary | ICD-10-CM | POA: Diagnosis not present

## 2023-09-14 DIAGNOSIS — G9341 Metabolic encephalopathy: Secondary | ICD-10-CM | POA: Diagnosis not present

## 2023-09-14 DIAGNOSIS — G8929 Other chronic pain: Secondary | ICD-10-CM | POA: Diagnosis not present

## 2023-09-14 DIAGNOSIS — M6259 Muscle wasting and atrophy, not elsewhere classified, multiple sites: Secondary | ICD-10-CM | POA: Diagnosis not present

## 2023-09-14 DIAGNOSIS — E440 Moderate protein-calorie malnutrition: Secondary | ICD-10-CM | POA: Diagnosis not present

## 2023-09-14 DIAGNOSIS — R0989 Other specified symptoms and signs involving the circulatory and respiratory systems: Secondary | ICD-10-CM | POA: Diagnosis not present

## 2023-09-14 DIAGNOSIS — I5031 Acute diastolic (congestive) heart failure: Secondary | ICD-10-CM | POA: Diagnosis not present

## 2023-09-14 HISTORY — DX: Hypo-osmolality and hyponatremia: E87.1

## 2023-09-14 HISTORY — DX: Paroxysmal atrial fibrillation: I48.0

## 2023-09-14 LAB — BASIC METABOLIC PANEL WITH GFR
Anion gap: 7 (ref 5–15)
BUN: 9 mg/dL (ref 8–23)
CO2: 26 mmol/L (ref 22–32)
Calcium: 8.3 mg/dL — ABNORMAL LOW (ref 8.9–10.3)
Chloride: 93 mmol/L — ABNORMAL LOW (ref 98–111)
Creatinine, Ser: 0.62 mg/dL (ref 0.61–1.24)
GFR, Estimated: >60 mL/min (ref >60)
Glucose, Bld: 149 mg/dL — ABNORMAL HIGH (ref 70–99)
Potassium: 4 mmol/L (ref 3.5–5.1)
Sodium: 126 mmol/L — ABNORMAL LOW (ref 135–145)

## 2023-09-14 LAB — CBC WITH DIFFERENTIAL/PLATELET
Abs Immature Granulocytes: 0.08 10*3/uL — ABNORMAL HIGH (ref 0.00–0.07)
Basophils Absolute: 0 10*3/uL (ref 0.0–0.1)
Basophils Relative: 0 %
Eosinophils Absolute: 0.1 10*3/uL (ref 0.0–0.5)
Eosinophils Relative: 1 %
HCT: 31.5 % — ABNORMAL LOW (ref 39.0–52.0)
Hemoglobin: 10.9 g/dL — ABNORMAL LOW (ref 13.0–17.0)
Immature Granulocytes: 1 %
Lymphocytes Relative: 14 %
Lymphs Abs: 1.6 10*3/uL (ref 0.7–4.0)
MCH: 30.9 pg (ref 26.0–34.0)
MCHC: 34.6 g/dL (ref 30.0–36.0)
MCV: 89.2 fL (ref 80.0–100.0)
Monocytes Absolute: 1.3 10*3/uL — ABNORMAL HIGH (ref 0.1–1.0)
Monocytes Relative: 11 %
Neutro Abs: 8.9 10*3/uL — ABNORMAL HIGH (ref 1.7–7.7)
Neutrophils Relative %: 73 %
Platelets: 392 10*3/uL (ref 150–400)
RBC: 3.53 MIL/uL — ABNORMAL LOW (ref 4.22–5.81)
RDW: 14.6 % (ref 11.5–15.5)
WBC: 12.1 10*3/uL — ABNORMAL HIGH (ref 4.0–10.5)
nRBC: 0 % (ref 0.0–0.2)

## 2023-09-14 LAB — TROPONIN I (HIGH SENSITIVITY): Troponin I (High Sensitivity): 17 ng/L (ref <18)

## 2023-09-14 LAB — RESP PANEL BY RT-PCR (RSV, FLU A&B, COVID)  RVPGX2
Influenza A by PCR: NEGATIVE
Influenza B by PCR: NEGATIVE
Resp Syncytial Virus by PCR: NEGATIVE
SARS Coronavirus 2 by RT PCR: NEGATIVE

## 2023-09-14 LAB — BRAIN NATRIURETIC PEPTIDE: B Natriuretic Peptide: 412.4 pg/mL — ABNORMAL HIGH (ref 0.0–100.0)

## 2023-09-14 LAB — PROCALCITONIN: Procalcitonin: <0.1 ng/mL

## 2023-09-14 MED ORDER — ALBUTEROL SULFATE (2.5 MG/3ML) 0.083% IN NEBU
2.5000 mg | INHALATION_SOLUTION | RESPIRATORY_TRACT | Status: DC | PRN
  Administered 2023-09-15: 2.5 mg via RESPIRATORY_TRACT
  Filled 2023-09-14: qty 3

## 2023-09-14 MED ORDER — SODIUM CHLORIDE 0.9 % IV SOLN
2.0000 g | Freq: Once | INTRAVENOUS | Status: AC
  Administered 2023-09-14: 2 g via INTRAVENOUS
  Filled 2023-09-14: qty 12.5

## 2023-09-14 MED ORDER — FUROSEMIDE 10 MG/ML IJ SOLN
40.0000 mg | Freq: Two times a day (BID) | INTRAMUSCULAR | Status: DC
  Administered 2023-09-14: 40 mg via INTRAVENOUS
  Filled 2023-09-14: qty 4

## 2023-09-14 MED ORDER — DM-GUAIFENESIN ER 30-600 MG PO TB12: 1.0000 | ORAL_TABLET | Freq: Two times a day (BID) | ORAL | Status: DC | PRN

## 2023-09-14 MED ORDER — SODIUM CHLORIDE 1 G PO TABS
1.0000 g | ORAL_TABLET | Freq: Three times a day (TID) | ORAL | Status: DC
  Administered 2023-09-14 – 2023-09-17 (×8): 1 g via ORAL
  Filled 2023-09-14 (×8): qty 1

## 2023-09-14 MED ORDER — IBUPROFEN 400 MG PO TABS
200.0000 mg | ORAL_TABLET | Freq: Four times a day (QID) | ORAL | Status: DC | PRN
  Administered 2023-09-16 – 2023-09-21 (×8): 200 mg via ORAL
  Filled 2023-09-14 (×9): qty 1

## 2023-09-14 MED ORDER — ENOXAPARIN SODIUM 40 MG/0.4ML IJ SOSY
40.0000 mg | PREFILLED_SYRINGE | INTRAMUSCULAR | Status: DC
  Administered 2023-09-14 – 2023-09-22 (×9): 40 mg via SUBCUTANEOUS
  Filled 2023-09-14 (×9): qty 0.4

## 2023-09-14 MED ORDER — VANCOMYCIN HCL IN DEXTROSE 1-5 GM/200ML-% IV SOLN
1000.0000 mg | Freq: Once | INTRAVENOUS | Status: AC
  Administered 2023-09-14: 1000 mg via INTRAVENOUS
  Filled 2023-09-14: qty 200

## 2023-09-14 MED ORDER — FUROSEMIDE 10 MG/ML IJ SOLN: 40.0000 mg | Freq: Once | INTRAMUSCULAR | Status: DC

## 2023-09-14 MED ORDER — NICOTINE 21 MG/24HR TD PT24
21.0000 mg | MEDICATED_PATCH | Freq: Every day | TRANSDERMAL | Status: DC
  Filled 2023-09-14 (×7): qty 1

## 2023-09-14 MED ORDER — ONDANSETRON HCL 4 MG/2ML IJ SOLN
4.0000 mg | Freq: Three times a day (TID) | INTRAMUSCULAR | Status: DC | PRN
  Filled 2023-09-14: qty 2

## 2023-09-14 MED ORDER — HYDRALAZINE HCL 20 MG/ML IJ SOLN: 5.0000 mg | INTRAMUSCULAR | Status: DC | PRN

## 2023-09-14 NOTE — H&P (Signed)
 History and Physical    Barry Fowler ZOX:[id number] DOB: [birth year] DOA: 09/14/2023  Referring MD/NP/PA:   PCP: [facility]   Patient coming from:  The patient is coming from home.     Chief Complaint:   HPI: Barry Fowler is a 68 y.o. male with medical history significant of      Data reviewed independently and ED Course: pt was found to have     ***       EKG: I have personally reviewed.  Not done in ED, will get one.   ***   Review of Systems:   General: no fevers, chills, no body weight gain, has poor appetite, has fatigue HEENT: no blurry vision, hearing changes or sore throat Respiratory: no dyspnea, coughing, wheezing CV: no chest pain, no palpitations GI: no nausea, vomiting, abdominal pain, diarrhea, constipation GU: no dysuria, burning on urination, increased urinary frequency, hematuria  Ext: no leg edema Neuro: no unilateral weakness, numbness, or tingling, no vision change or hearing loss Skin: no rash, no skin tear. MSK: No muscle spasm, no deformity, no limitation of range of movement in spin Heme: No easy bruising.  Travel history: No recent long distant travel.   Allergy: No Known Allergies  Past Medical History:  Diagnosis Date   Hypertension     Past Surgical History:  Procedure Laterality Date   EYE SURGERY      Social History:  reports that he has been smoking cigarettes. He has a 42 pack-year smoking history. He has never used smokeless tobacco. He reports current alcohol use. No history on file for drug use.  Family History: No family history on file.   Prior to Admission medications   Medication Sig Start Date End Date Taking? Authorizing Provider  ascorbic acid  (VITAMIN C ) 500 MG tablet Take 1 tablet (500 mg total) by mouth 2 (two) times daily. 09/13/23   Tiajuana Fluke, MD  feeding supplement (ENSURE PLUS HIGH PROTEIN) LIQD Take 237 mLs by mouth 3 (three) times daily between meals. 09/13/23   Tiajuana Fluke, MD  folic acid  (FOLVITE ) 1 MG tablet Take 1 tablet (1 mg total) by mouth daily. 12/11/20   Johnson, Clanford L, MD  losartan (COZAAR) 25 MG tablet Take 25 mg by mouth daily. 06/20/23   [provider]  metoprolol tartrate (LOPRESSOR) 25 MG tablet Take 1 tablet (25 mg total) by mouth 2 (two) times daily. 09/13/23   Tiajuana Fluke, MD  Multiple Vitamin (MULTIVITAMIN WITH MINERALS) TABS tablet Take 1 tablet by mouth daily. 01/19/20   Mason Sole, Pratik D, DO  pantoprazole  (PROTONIX ) 40 MG tablet Take 1 tablet (40 mg total) by mouth daily. 09/13/23 09/12/24  Tiajuana Fluke, MD  polyethylene glycol (MIRALAX  / GLYCOLAX ) 17 g packet Take 17 g by mouth daily as needed for mild constipation. 12/10/20   Johnson, Clanford L, MD  pravastatin (PRAVACHOL) 20 MG tablet Take 20 mg by mouth daily. 10/16/19   [provider]  sodium chloride  1 g tablet Take 1 tablet (1 g total) by mouth 3 (three) times daily with meals. 09/13/23   Tiajuana Fluke, MD  thiamine  100 MG tablet Take 1 tablet (100 mg total) by mouth daily. 01/19/20   Doreene Gammon D, DO    Physical Exam: Vitals:   09/14/23 2047 09/14/23 2048  BP: 126/80   Pulse: 85   Resp: 20   Temp: 98 F (36.7 C) 98.6 F (37 C)  TempSrc:  Oral  SpO2:  95%    General: Not in acute distress HEENT:       Eyes: PERRL, EOMI, no jaundice       ENT: No discharge from the ears and nose, no pharynx injection, no tonsillar enlargement.        Neck: No JVD, no bruit, no mass felt. Heme: No neck lymph node enlargement. Cardiac: S1/S2, RRR, No murmurs, No gallops or rubs. Respiratory: No rales, wheezing, rhonchi or rubs. GI: Soft, nondistended, nontender, no rebound pain, no organomegaly, BS present. GU: No hematuria Ext: No pitting leg edema bilaterally. 1+DP/PT pulse bilaterally. Musculoskeletal: No joint deformities, No joint redness or warmth, no limitation of ROM in spin. Skin: No rashes.  Neuro: Alert, oriented X3, cranial nerves  II-XII grossly intact, moves all extremities normally. Muscle strength 5/5 in all extremities, sensation to light touch intact. Brachial reflex 2+ bilaterally. Knee reflex 1+ bilaterally. Negative Babinski's sign. Normal finger to nose test. Psych: Patient is not psychotic, no suicidal or hemocidal ideation.  Labs on Admission: I have personally reviewed following labs and imaging studies  CBC: Recent Labs  Lab 09/08/23 0318 09/10/23 1438 09/11/23 1412 09/12/23 0052 09/13/23 0327 09/14/23 2055  WBC 9.7 9.0 9.7 9.2 8.4 12.1*  NEUTROABS 6.9  --   --   --   --  8.9*  HGB 11.5* 9.9* 10.8* 9.5* 9.4* 10.9*  HCT 31.9* 28.3* 32.4* 28.2* 27.9* 31.5*  MCV 84.8 88.2 92.3 89.2 89.4 89.2  PLT 321 298 260 307 327 392   Basic Metabolic Panel: Recent Labs  Lab 09/08/23 0318 09/08/23 0824 09/09/23 0422 09/09/23 0742 09/10/23 1438 09/10/23 2237 09/11/23 0557 09/11/23 1100 09/11/23 2307 09/12/23 1146 09/12/23 2251 09/13/23 1123 09/14/23 2055  NA 120*   < > 125*   < >  --    < > 126*   < > 126* 126* 124* 124* 126*  K 3.3*  --  3.2*  --  4.2  --  4.2  --   --   --   --   --  4.0  CL 86*  --  92*  --   --   --  93*  --   --   --   --   --  93*  CO2 23  --  28  --   --   --  24  --   --   --   --   --  26  GLUCOSE 94  --  123*  --   --   --  122*  --   --   --   --   --  149*  BUN <5*  --  <5*  --   --   --  7*  --   --   --   --   --  9  CREATININE 0.49*  --  0.44*  --   --   --  0.45*  --   --   --   --   --  0.62  CALCIUM  7.5*  --  7.5*  --   --   --  7.8*  --   --   --   --   --  8.3*  MG 2.0  --  1.7  --  1.9  --  1.9  --   --   --   --   --   --   PHOS 2.1*  --  2.8  --   --   --   --   --   --   --   --   --   --    < > =  values in this interval not displayed.   GFR: Estimated Creatinine Clearance: 99.7 mL/min (by C-G formula based on SCr of 0.62 mg/dL). Liver Function Tests: No results for input(s): AST, ALT, ALKPHOS, BILITOT, PROT, ALBUMIN in the last 168 hours. No  results for input(s): LIPASE, AMYLASE in the last 168 hours. No results for input(s): AMMONIA in the last 168 hours. Coagulation Profile: No results for input(s): INR, PROTIME in the last 168 hours. Cardiac Enzymes: No results for input(s): CKTOTAL, CKMB, CKMBINDEX, TROPONINI in the last 168 hours. BNP (last 3 results) No results for input(s): PROBNP in the last 8760 hours. HbA1C: No results for input(s): HGBA1C in the last 72 hours. CBG: Recent Labs  Lab 09/10/23 2031 09/10/23 2357 09/11/23 0333 09/11/23 0752 09/12/23 2047  GLUCAP 142* 131* 124* 117* 126*   Lipid Profile: No results for input(s): CHOL, HDL, LDLCALC, TRIG, CHOLHDL, LDLDIRECT in the last 72 hours. Thyroid Function Tests: No results for input(s): TSH, T4TOTAL, FREET4, T3FREE, THYROIDAB in the last 72 hours. Anemia Panel: No results for input(s): VITAMINB12, FOLATE, FERRITIN, TIBC, IRON, RETICCTPCT in the last 72 hours. Urine analysis:    Component Value Date/Time   COLORURINE YELLOW (A) 09/05/2023 1905   APPEARANCEUR CLEAR (A) 09/05/2023 1905   APPEARANCEUR Clear 01/10/2012 1248   LABSPEC 1.020 09/05/2023 1905   LABSPEC 1.008 01/10/2012 1248   PHURINE 6.0 09/05/2023 1905   GLUCOSEU 150 (A) 09/05/2023 1905   GLUCOSEU Negative 01/10/2012 1248   HGBUR SMALL (A) 09/05/2023 1905   BILIRUBINUR NEGATIVE 09/05/2023 1905   BILIRUBINUR Negative 01/10/2012 1248   KETONESUR 80 (A) 09/05/2023 1905   PROTEINUR 30 (A) 09/05/2023 1905   NITRITE NEGATIVE 09/05/2023 1905   LEUKOCYTESUR NEGATIVE 09/05/2023 1905   LEUKOCYTESUR Negative 01/10/2012 1248   Sepsis Labs: @LABRCNTIP (procalcitonin:4,lacticidven:4) ) Recent Results (from the past 240 hours)  MRSA Next Gen by PCR, Nasal     Status: None   Collection Time: 09/05/23  8:25 PM   Specimen: Nasal Mucosa; Nasal Swab  Result Value Ref Range Status   MRSA by PCR Next Gen NOT DETECTED NOT DETECTED Final     Comment: (NOTE) The GeneXpert MRSA Assay (FDA approved for NASAL specimens only), is one component of a comprehensive MRSA colonization surveillance program. It is not intended to diagnose MRSA infection nor to guide or monitor treatment for MRSA infections. Test performance is not FDA approved in patients less than 67 years old. Performed at [facility], [address] [place], [place] [address]   Culture, blood (Routine X 2) w Reflex to ID Panel     Status: None   Collection Time: 09/06/23  1:12 AM   Specimen: BLOOD  Result Value Ref Range Status   Specimen Description BLOOD BLOOD LEFT ARM  Final   Special Requests   Final    BOTTLES DRAWN AEROBIC AND ANAEROBIC Blood Culture adequate volume   Culture   Final    NO GROWTH 5 DAYS Performed at [facility], [address], [place], [place] [address]    Report Status 09/11/2023 FINAL  Final  Culture, blood (Routine X 2) w Reflex to ID Panel     Status: None   Collection Time: 09/06/23  1:12 AM   Specimen: BLOOD  Result Value Ref Range Status   Specimen Description BLOOD BLOOD LEFT ARM  Final   Special Requests Blood Culture adequate volume  Final   Culture   Final    NO GROWTH 5 DAYS Performed at [facility],  [address] [place], [place] [address]    Report Status 09/11/2023 FINAL  Final     Radiological Exams on Admission:   Assessment/Plan Active Problems:   * No active hospital problems. *   Assessment and Plan: No notes have been filed under this hospital service. Service: Hospitalist      Active Problems:   * No active hospital problems. *    DVT ppx: SQ Heparin          SQ Lovenox   Code Status: Full code   ***  Family Communication:     not done, no family member is at bed side.              Yes, patient's    at bed side.       by phone   ***  Disposition Plan:  Anticipate discharge back to previous environment  Consults called:    Admission status and Level of  care: :    for obs as inpt        Dispo: The patient is from: {From:23814}              Anticipated d/c is to: {To:23815}              Anticipated d/c date is: {Days:23816}              Patient currently {Medically stable:23817}    Severity of Illness:  {Observation/Inpatient:21159}       Date of Service 09/14/2023    Fidencio Hue [facility]   If 7PM-7AM, please contact night-coverage [email or web address] 09/14/2023, 10:32 PM

## 2023-09-14 NOTE — ED Triage Notes (Addendum)
 From [facility]. First day at the facility. Facility called for sudden SHOB, in the 23s on RA. EMS gave NRB at 15L Sats to mid 90s. Was discharged from [facility] for ETOH and fall  Chest Xray today from facility states CHF, bilateral pleural effusions, Bi- basilar atelectasis

## 2023-09-14 NOTE — ED Notes (Signed)
Patient refused 2nd set of blood cultures

## 2023-09-14 NOTE — ED Provider Notes (Signed)
**Note Barry-Identified via Obfuscation**  [facility] Provider Note    Event Date/Time   First MD Initiated Contact with Patient 09/14/23 2044     (approximate)   History   Shortness of Breath   HPI  Barry Fowler is a 68 y.o. male who presents to the urgency department today because of concerns for hypoxia.  Patient is coming from [facility].  Patient had been discharged from the hospital yesterday.  Per discharge report he had been admitted for hyponatremia.  Patient himself states that he has been feeling pretty good.  He denies any shortness of breath or chest pain.     Physical Exam   Triage Vital Signs: ED Triage Vitals  Encounter Vitals Group     BP 09/14/23 2047 126/80     Girls Systolic BP Percentile --      Girls Diastolic BP Percentile --      Boys Systolic BP Percentile --      Boys Diastolic BP Percentile --      Pulse Rate 09/14/23 2047 85     Resp 09/14/23 2047 20     Temp 09/14/23 2047 98 F (36.7 C)     Temp Source 09/14/23 2048 Oral     SpO2 09/14/23 2047 95 %     Weight --      Height --      Head Circumference --      [facility] --      Pain Score --      Pain Loc --      Pain Education --      Exclude from Growth Chart --     Most recent vital signs: Vitals:   09/14/23 2047 09/14/23 2048  BP: 126/80   Pulse: 85   Resp: 20   Temp: 98 F (36.7 C) 98.6 F (37 C)  SpO2: 95%    General: Awake, alert, oriented. CV:  Good peripheral perfusion. Regular rate and rhythm. Resp:  Slightly increased work of breathing. On nasal cannula. No rhonchi appreciated. Abd:  No distention.     ED Results / Procedures / Treatments   Labs (all labs ordered are listed, but only abnormal results are displayed) Labs Reviewed  BRAIN NATRIURETIC PEPTIDE - Abnormal; Notable for the following components:      Result Value   B Natriuretic Peptide 412.4 (*)    All other components within normal limits  CBC WITH DIFFERENTIAL/PLATELET - Abnormal; Notable for the following  components:   WBC 12.1 (*)    RBC 3.53 (*)    Hemoglobin 10.9 (*)    HCT 31.5 (*)    Neutro Abs 8.9 (*)    Monocytes Absolute 1.3 (*)    Abs Immature Granulocytes 0.08 (*)    All other components within normal limits  BASIC METABOLIC PANEL WITH GFR - Abnormal; Notable for the following components:   Sodium 126 (*)    Chloride 93 (*)    Glucose, Bld 149 (*)    Calcium  8.3 (*)    All other components within normal limits  RESP PANEL BY RT-PCR (RSV, FLU A&B, COVID)  RVPGX2  CULTURE, BLOOD (ROUTINE X 2)  CULTURE, BLOOD (ROUTINE X 2)  EXPECTORATED SPUTUM ASSESSMENT W GRAM STAIN, RFLX TO RESP C  LACTIC ACID, PLASMA  LACTIC ACID, PLASMA  PROCALCITONIN  STREP PNEUMONIAE URINARY ANTIGEN  LEGIONELLA PNEUMOPHILA SEROGP 1 UR AG  BASIC METABOLIC PANEL WITH GFR  CBC  MAGNESIUM   TROPONIN I (HIGH SENSITIVITY)  EKG  I, Marylynn Soho, attending physician, personally viewed and interpreted this EKG  EKG Time: 2047 Rate: 83 Rhythm: sinus rhythm Axis: normal Intervals: qtc 393 QRS: narrow, q waves v1 ST changes: no st elevation Impression: abnormal ekg    RADIOLOGY I independently interpreted and visualized the CXR. My interpretation: bilateral pleural effusion Radiology interpretation:  IMPRESSION:  1. Increased interstitial markings in the lungs bilaterally, favoring mild  multifocal infection over interstitial edema.  2. Small bilateral pleural effusions.      PROCEDURES:  Critical Care performed: No    MEDICATIONS ORDERED IN ED: Medications - No data to display   IMPRESSION / MDM / ASSESSMENT AND PLAN / ED COURSE  I reviewed the triage vital signs and the nursing notes.                              Differential diagnosis includes, but is not limited to, pneumonia, CHF, PE, viral illness  Patient's presentation is most consistent with acute presentation with potential threat to life or bodily function.   The patient is on the cardiac monitor to evaluate  for evidence of arrhythmia and/or significant heart rate changes.  Patient presented to the emergency department today from [facility] because of concerns for hypoxia.  Patient himself without any significant plaints although does have slightly increased work of breathing.  Patient was able to maintain good oxygen saturation on nasal cannula here.  Workup is concerning for possible pneumonia as well as CHF.  Patient was given broad-spectrum antibiotics.  Will give patient dose of Lasix.  Discussed with Dr. Rosalea Collin with the hospitalist service who will evaluate for admission.      FINAL CLINICAL IMPRESSION(S) / ED DIAGNOSES   Final diagnoses:  Hypoxia  Shortness of breath  Pneumonia due to infectious organism, unspecified laterality, unspecified part of lung  Hypervolemia, unspecified hypervolemia type        Note:  This document was prepared using Dragon voice recognition software and may include unintentional dictation errors.    Marylynn Soho, MD 09/14/23 [phone]

## 2023-09-15 ENCOUNTER — Encounter: Payer: Self-pay | Admitting: Internal Medicine

## 2023-09-15 DIAGNOSIS — J189 Pneumonia, unspecified organism: Secondary | ICD-10-CM | POA: Diagnosis not present

## 2023-09-15 LAB — BASIC METABOLIC PANEL WITH GFR
Anion gap: 10 (ref 5–15)
BUN: 7 mg/dL — ABNORMAL LOW (ref 8–23)
CO2: 24 mmol/L (ref 22–32)
Calcium: 8.1 mg/dL — ABNORMAL LOW (ref 8.9–10.3)
Chloride: 91 mmol/L — ABNORMAL LOW (ref 98–111)
Creatinine, Ser: 0.49 mg/dL — ABNORMAL LOW (ref 0.61–1.24)
GFR, Estimated: >60 mL/min (ref >60)
Glucose, Bld: 138 mg/dL — ABNORMAL HIGH (ref 70–99)
Potassium: 3.8 mmol/L (ref 3.5–5.1)
Sodium: 125 mmol/L — ABNORMAL LOW (ref 135–145)

## 2023-09-15 LAB — MAGNESIUM: Magnesium: 1.6 mg/dL — ABNORMAL LOW (ref 1.7–2.4)

## 2023-09-15 LAB — CBC
HCT: 32.1 % — ABNORMAL LOW (ref 39.0–52.0)
Hemoglobin: 11.1 g/dL — ABNORMAL LOW (ref 13.0–17.0)
MCH: 30.6 pg (ref 26.0–34.0)
MCHC: 34.6 g/dL (ref 30.0–36.0)
MCV: 88.4 fL (ref 80.0–100.0)
Platelets: 343 10*3/uL (ref 150–400)
RBC: 3.63 MIL/uL — ABNORMAL LOW (ref 4.22–5.81)
RDW: 14.4 % (ref 11.5–15.5)
WBC: 11.1 10*3/uL — ABNORMAL HIGH (ref 4.0–10.5)
nRBC: 0 % (ref 0.0–0.2)

## 2023-09-15 LAB — MRSA NEXT GEN BY PCR, NASAL: MRSA by PCR Next Gen: NOT DETECTED

## 2023-09-15 LAB — EXPECTORATED SPUTUM ASSESSMENT W GRAM STAIN, RFLX TO RESP C

## 2023-09-15 LAB — LACTIC ACID, PLASMA
Lactic Acid, Venous: 0.9 mmol/L (ref 0.5–1.9)
Lactic Acid, Venous: 0.8 mmol/L (ref 0.5–1.9)

## 2023-09-15 LAB — STREP PNEUMONIAE URINARY ANTIGEN: Strep Pneumo Urinary Antigen: NEGATIVE

## 2023-09-15 MED ORDER — METOPROLOL TARTRATE 25 MG PO TABS
25.0000 mg | ORAL_TABLET | Freq: Two times a day (BID) | ORAL | Status: DC
  Administered 2023-09-15 – 2023-09-22 (×15): 25 mg via ORAL
  Filled 2023-09-15 (×15): qty 1

## 2023-09-15 MED ORDER — SODIUM CHLORIDE 0.9 % IV BOLUS
500.0000 mL | Freq: Once | INTRAVENOUS | Status: AC
  Administered 2023-09-15: 500 mL via INTRAVENOUS

## 2023-09-15 MED ORDER — VITAMIN C 500 MG PO TABS
500.0000 mg | ORAL_TABLET | Freq: Two times a day (BID) | ORAL | Status: DC
  Administered 2023-09-15 – 2023-09-23 (×17): 500 mg via ORAL
  Filled 2023-09-15 (×17): qty 1

## 2023-09-15 MED ORDER — PANTOPRAZOLE SODIUM 40 MG PO TBEC
40.0000 mg | DELAYED_RELEASE_TABLET | Freq: Every day | ORAL | Status: DC
  Administered 2023-09-15 – 2023-09-23 (×9): 40 mg via ORAL
  Filled 2023-09-15 (×9): qty 1

## 2023-09-15 MED ORDER — THIAMINE HCL 100 MG PO TABS
100.0000 mg | ORAL_TABLET | Freq: Every day | ORAL | Status: DC
  Administered 2023-09-15 – 2023-09-23 (×9): 100 mg via ORAL
  Filled 2023-09-15 (×18): qty 1

## 2023-09-15 MED ORDER — DILTIAZEM HCL-DEXTROSE 125-5 MG/125ML-% IV SOLN (PREMIX)
5.0000 mg/h | INTRAVENOUS | Status: DC
  Administered 2023-09-15: 5 mg/h via INTRAVENOUS
  Filled 2023-09-15: qty 125

## 2023-09-15 MED ORDER — VANCOMYCIN HCL 1250 MG/250ML IV SOLN
1250.0000 mg | Freq: Two times a day (BID) | INTRAVENOUS | Status: DC
  Administered 2023-09-15: 1250 mg via INTRAVENOUS
  Filled 2023-09-15: qty 250

## 2023-09-15 MED ORDER — DILTIAZEM HCL 25 MG/5ML IV SOLN
10.0000 mg | Freq: Once | INTRAVENOUS | Status: AC
  Administered 2023-09-15: 10 mg via INTRAVENOUS
  Filled 2023-09-15: qty 5

## 2023-09-15 MED ORDER — FOLIC ACID 1 MG PO TABS
1.0000 mg | ORAL_TABLET | Freq: Every day | ORAL | Status: DC
  Administered 2023-09-15 – 2023-09-23 (×9): 1 mg via ORAL
  Filled 2023-09-15 (×9): qty 1

## 2023-09-15 MED ORDER — NYSTATIN 100000 UNIT/GM EX POWD
1.0000 | Freq: Two times a day (BID) | CUTANEOUS | Status: DC
  Administered 2023-09-15 – 2023-09-23 (×17): 1 via TOPICAL
  Filled 2023-09-15 (×2): qty 15

## 2023-09-15 MED ORDER — SODIUM CHLORIDE 0.9 % IV SOLN
2.0000 g | Freq: Three times a day (TID) | INTRAVENOUS | Status: DC
  Administered 2023-09-15 – 2023-09-20 (×15): 2 g via INTRAVENOUS
  Filled 2023-09-15 (×18): qty 12.5

## 2023-09-15 MED ORDER — ADULT MULTIVITAMIN W/MINERALS CH
1.0000 | ORAL_TABLET | Freq: Every day | ORAL | Status: DC
  Administered 2023-09-15 – 2023-09-23 (×9): 1 via ORAL
  Filled 2023-09-15 (×9): qty 1

## 2023-09-15 MED ORDER — VANCOMYCIN HCL IN DEXTROSE 1-5 GM/200ML-% IV SOLN
1000.0000 mg | Freq: Once | INTRAVENOUS | Status: AC
  Administered 2023-09-15: 1000 mg via INTRAVENOUS
  Filled 2023-09-15: qty 200

## 2023-09-15 MED ORDER — MAGNESIUM SULFATE 2 GM/50ML IV SOLN
2.0000 g | Freq: Once | INTRAVENOUS | Status: AC
  Administered 2023-09-15: 2 g via INTRAVENOUS
  Filled 2023-09-15: qty 50

## 2023-09-15 NOTE — Progress Notes (Signed)
 PROGRESS NOTE    Barry Fowler  ZOX:[id number] DOB: Feb 20, 1956 DOA: 09/14/2023 PCP: [facility]   Brief Narrative:    Barry Fowler is a 68 y.o. male with medical history significant of tobacco abuse, alcohol abuse, hypertension, chronic hyponatremia, who presents with SOB.   Patient was recently hospitalized from 6/9 - 6/17 due to severe hyponatremia with sodium 104 which improved to 124 at discharge; also had new onset A-fib, started on metoprolol not anticoagulant since patient is a poor candidate for anticoagulant use.  Patient also had possible pneumonia which was treated with Rocephin  and doxycycline  in hospital.   Patient states that today he developed worsening shortness of breath and cough with little mucus production.  Patient normally not using oxygen, but was found to have moderate respiratory distress, oxygen desaturation to 80% on room air, initially started nonrebreather, then changed to 5 L of oxygen ED with saturation 95%.  Patient has been admitted with acute hypoxemic respiratory failure secondary to suspected HCAP and has been started on vancomycin and cefepime.  He is noted to have some atrial fibrillation with RVR this a.m.  Assessment & Plan:   Principal Problem:   HCAP (healthcare-associated pneumonia) Active Problems:   Acute respiratory failure with hypoxia (HCC)   Paroxysmal atrial fibrillation (HCC)   HTN (hypertension)   Hyponatremia   Alcohol abuse   Tobacco abuse  Assessment and Plan:   Acute respiratory failure with hypoxia due to HCAP (healthcare-associated pneumonia): pt has worsening shortness of breath with moderate acute respiratory distress and 5 days of new oxygen requirement.  Chest x-ray showed increased interstitial markings in the lungs bilaterally, indicating HCAP.  Patient has elevated BNP 412, but no leg edema or JVD. Recent 2D echo on 09/12/2023 showed EF of 55 to 60% with normal diastolic function; does not seem to  have acute CHF.    - Will admit to PCU as inpt - IV Vancomycin and cefepime - Mucinex  for cough  - Bronchodilators - Urine legionella and S. pneumococcal antigen - Follow up blood culture x2, sputum culture - 40 mg of lasix is given due to elevated BNP   Paroxysmal atrial fibrillation with RVR (HCC): -Continue metoprolol -Given dose of IV Cardizem this a.m.   HTN (hypertension) -IV hydralazine as needed - Metoprolol   Hyponatremia: Sodium 125 which has improved from recent 104. - Continue sodium chloride  tablets 1 g 3 times daily - Fluid restriction  Hypomagnesemia -Replete and reevaluate in a.m.   Alcohol abuse and Tobacco abuse: Patient did not drink alcohol since discharged to SNF. -did counseling about importance of quitting alcohol and tobacco -Nicotine  patch -continue folic acid  and Vitamin b1    DVT prophylaxis:Lovenox  Code Status: Full Family Communication: None at bedside Disposition Plan:  Status is: Inpatient Remains inpatient appropriate because: Need for IV medications   Consultants:  None  Procedures:  None  Antimicrobials:  Anti-infectives (From admission, onward)    Start     Dose/Rate Route Frequency Ordered Stop   09/15/23 1100  vancomycin (VANCOREADY) IVPB 1250 mg/250 mL        1,250 mg 166.7 mL/hr over 90 Minutes Intravenous Every 12 hours 09/15/23 0133     09/15/23 0700  ceFEPIme (MAXIPIME) 2 g in sodium chloride  0.9 % 100 mL IVPB        2 g 200 mL/hr over 30 Minutes Intravenous Every 8 hours 09/15/23 0128     09/15/23 0130  vancomycin (VANCOCIN) IVPB 1000 mg/200 mL premix  1,000 mg 200 mL/hr over 60 Minutes Intravenous  Once 09/15/23 0129 09/15/23 0247   09/14/23 2200  ceFEPIme (MAXIPIME) 2 g in sodium chloride  0.9 % 100 mL IVPB        2 g 200 mL/hr over 30 Minutes Intravenous  Once 09/14/23 2157 09/14/23 2321   09/14/23 2200  vancomycin (VANCOCIN) IVPB 1000 mg/200 mL premix        1,000 mg 200 mL/hr over 60 Minutes  Intravenous  Once 09/14/23 2157 09/15/23 0025       Subjective: Patient seen and evaluated today with no new acute complaints or concerns. No acute concerns or events noted overnight.  Objective: Vitals:   09/15/23 0935 09/15/23 0937 09/15/23 1011 09/15/23 1012  BP:  90/64  95/72  Pulse:  (!) 128 (!) 106 (!) 108  Resp:  (!) 23 (!) 7 19  Temp: 98.6 F (37 C)     TempSrc: Oral     SpO2:  92% 92%     Intake/Output Summary (Last 24 hours) at 09/15/2023 1046 Last data filed at 09/15/2023 0936 Gross per 24 hour  Intake 150 ml  Output --  Net 150 ml   There were no vitals filed for this visit.  Examination:  General exam: Appears calm and comfortable  Respiratory system: Clear to auscultation. Respiratory effort normal.  5 L nasal cannula Cardiovascular system: S1 & S2 heard, irregular and tachycardic Gastrointestinal system: Abdomen is soft Central nervous system: Alert and awake Extremities: No edema Skin: No significant lesions noted Psychiatry: Flat affect.    Data Reviewed: I have personally reviewed following labs and imaging studies  CBC: Recent Labs  Lab 09/11/23 1412 09/12/23 0052 09/13/23 0327 09/14/23 2055 09/15/23 0511  WBC 9.7 9.2 8.4 12.1* 11.1*  NEUTROABS  --   --   --  8.9*  --   HGB 10.8* 9.5* 9.4* 10.9* 11.1*  HCT 32.4* 28.2* 27.9* 31.5* 32.1*  MCV 92.3 89.2 89.4 89.2 88.4  PLT 260 307 327 392 343   Basic Metabolic Panel: Recent Labs  Lab 09/09/23 0422 09/09/23 0742 09/10/23 1438 09/10/23 2237 09/11/23 0557 09/11/23 1100 09/12/23 1146 09/12/23 2251 09/13/23 1123 09/14/23 2055 09/15/23 0511  NA 125*   < >  --    < > 126*   < > 126* 124* 124* 126* 125*  K 3.2*  --  4.2  --  4.2  --   --   --   --  4.0 3.8  CL 92*  --   --   --  93*  --   --   --   --  93* 91*  CO2 28  --   --   --  24  --   --   --   --  26 24  GLUCOSE 123*  --   --   --  122*  --   --   --   --  149* 138*  BUN <5*  --   --   --  7*  --   --   --   --  9 7*   CREATININE 0.44*  --   --   --  0.45*  --   --   --   --  0.62 0.49*  CALCIUM  7.5*  --   --   --  7.8*  --   --   --   --  8.3* 8.1*  MG 1.7  --  1.9  --  1.9  --   --   --   --   --  1.6*  PHOS 2.8  --   --   --   --   --   --   --   --   --   --    < > = values in this interval not displayed.   GFR: Estimated Creatinine Clearance: 99.7 mL/min (A) (by C-G formula based on SCr of 0.49 mg/dL (L)). Liver Function Tests: No results for input(s): AST, ALT, ALKPHOS, BILITOT, PROT, ALBUMIN in the last 168 hours. No results for input(s): LIPASE, AMYLASE in the last 168 hours. No results for input(s): AMMONIA in the last 168 hours. Coagulation Profile: No results for input(s): INR, PROTIME in the last 168 hours. Cardiac Enzymes: No results for input(s): CKTOTAL, CKMB, CKMBINDEX, TROPONINI in the last 168 hours. BNP (last 3 results) No results for input(s): PROBNP in the last 8760 hours. HbA1C: No results for input(s): HGBA1C in the last 72 hours. CBG: Recent Labs  Lab 09/10/23 2031 09/10/23 2357 09/11/23 0333 09/11/23 0752 09/12/23 2047  GLUCAP 142* 131* 124* 117* 126*   Lipid Profile: No results for input(s): CHOL, HDL, LDLCALC, TRIG, CHOLHDL, LDLDIRECT in the last 72 hours. Thyroid Function Tests: No results for input(s): TSH, T4TOTAL, FREET4, T3FREE, THYROIDAB in the last 72 hours. Anemia Panel: No results for input(s): VITAMINB12, FOLATE, FERRITIN, TIBC, IRON, RETICCTPCT in the last 72 hours. Sepsis Labs: Recent Labs  Lab 09/14/23 2055 09/14/23 2319 09/15/23 0511  PROCALCITON <0.10  --   --   LATICACIDVEN  --  0.9 0.8    Recent Results (from the past 240 hours)  MRSA Next Gen by PCR, Nasal     Status: None   Collection Time: 09/05/23  8:25 PM   Specimen: Nasal Mucosa; Nasal Swab  Result Value Ref Range Status   MRSA by PCR Next Gen NOT DETECTED NOT DETECTED Final    Comment: (NOTE) The GeneXpert  MRSA Assay (FDA approved for NASAL specimens only), is one component of a comprehensive MRSA colonization surveillance program. It is not intended to diagnose MRSA infection nor to guide or monitor treatment for MRSA infections. Test performance is not FDA approved in patients less than 16 years old. Performed at [facility], [address] [place], [place] [address]   Culture, blood (Routine X 2) w Reflex to ID Panel     Status: None   Collection Time: 09/06/23  1:12 AM   Specimen: BLOOD  Result Value Ref Range Status   Specimen Description BLOOD BLOOD LEFT ARM  Final   Special Requests   Final    BOTTLES DRAWN AEROBIC AND ANAEROBIC Blood Culture adequate volume   Culture   Final    NO GROWTH 5 DAYS Performed at [facility], [address], [place], [place] [address]    Report Status 09/11/2023 FINAL  Final  Culture, blood (Routine X 2) w Reflex to ID Panel     Status: None   Collection Time: 09/06/23  1:12 AM   Specimen: BLOOD  Result Value Ref Range Status   Specimen Description BLOOD BLOOD LEFT ARM  Final   Special Requests Blood Culture adequate volume  Final   Culture   Final    NO GROWTH 5 DAYS Performed at [facility], [address] [place], [place] [address]    Report Status 09/11/2023 FINAL  Final  Resp panel by RT-PCR (RSV, Flu A&B, Covid) Anterior Nasal Swab     Status: None   Collection Time: 09/14/23 10:53 PM   Specimen: Anterior  Nasal Swab  Result Value Ref Range Status   SARS Coronavirus 2 by RT PCR NEGATIVE NEGATIVE Final    Comment: (NOTE) SARS-CoV-2 target nucleic acids are NOT DETECTED.  The SARS-CoV-2 RNA is generally detectable in upper respiratory specimens during the acute phase of infection. The lowest concentration of SARS-CoV-2 viral copies this assay can detect is 138 copies/mL. A negative result does not preclude SARS-Cov-2 infection and should not be used as the sole basis for treatment or other patient  management decisions. A negative result may occur with  improper specimen collection/handling, submission of specimen other than nasopharyngeal swab, presence of viral mutation(s) within the areas targeted by this assay, and inadequate number of viral copies(<138 copies/mL). A negative result must be combined with clinical observations, patient history, and epidemiological information. The expected result is Negative.  Fact Sheet for Patients:  BloggerCourse.com  Fact Sheet for Healthcare Providers:  SeriousBroker.it  This test is no t yet approved or cleared by the [place]  FDA and  has been authorized for detection and/or diagnosis of SARS-CoV-2 by FDA under an Emergency Use Authorization (EUA). This EUA will remain  in effect (meaning this test can be used) for the duration of the COVID-19 declaration under Section 564(b)(1) of the Act, 21 U.S.C.section 360bbb-3(b)(1), unless the authorization is terminated  or revoked sooner.       Influenza A by PCR NEGATIVE NEGATIVE Final   Influenza B by PCR NEGATIVE NEGATIVE Final    Comment: (NOTE) The Xpert Xpress SARS-CoV-2/FLU/RSV plus assay is intended as an aid in the diagnosis of influenza from Nasopharyngeal swab specimens and should not be used as a sole basis for treatment. Nasal washings and aspirates are unacceptable for Xpert Xpress SARS-CoV-2/FLU/RSV testing.  Fact Sheet for Patients: BloggerCourse.com  Fact Sheet for Healthcare Providers: SeriousBroker.it  This test is not yet approved or cleared by the [place]  FDA and has been authorized for detection and/or diagnosis of SARS-CoV-2 by FDA under an Emergency Use Authorization (EUA). This EUA will remain in effect (meaning this test can be used) for the duration of the COVID-19 declaration under Section 564(b)(1) of the Act, 21 U.S.C. section 360bbb-3(b)(1),  unless the authorization is terminated or revoked.     Resp Syncytial Virus by PCR NEGATIVE NEGATIVE Final    Comment: (NOTE) Fact Sheet for Patients: BloggerCourse.com  Fact Sheet for Healthcare Providers: SeriousBroker.it  This test is not yet approved or cleared by the [place]  FDA and has been authorized for detection and/or diagnosis of SARS-CoV-2 by FDA under an Emergency Use Authorization (EUA). This EUA will remain in effect (meaning this test can be used) for the duration of the COVID-19 declaration under Section 564(b)(1) of the Act, 21 U.S.C. section 360bbb-3(b)(1), unless the authorization is terminated or revoked.  Performed at [facility], [address], [place], [place] [address]          Radiology Studies: DG Chest Portable 1 View Result Date: 09/14/2023 EXAM: 1 VIEW XRAY OF THE CHEST 09/14/2023 09:42:12 PM COMPARISON: 09/13/2023 CLINICAL HISTORY: SOB. From [facility]. First day at the facility. Facility called for sudden SHOB, in the 58s on RA. EMS gave NRB at 15L Sats to mid 90s. Was discharged from [facility] for ETOH and fall; Chest Xray today from facility states CHF, bilateral pleural effusions, Bi-basilar atelectasis. FINDINGS: LUNGS AND PLEURA: Small bilateral pleural effusions. Increased interstitial markings in the lungs bilaterally, favoring mild multifocal infection over interstitial edema. Bilateral lower lobe opacities, likely atelectasis. HEART AND  MEDIASTINUM: No acute abnormality of the cardiac and mediastinal silhouettes. BONES AND SOFT TISSUES: No acute osseous abnormality. IMPRESSION: 1. Increased interstitial markings in the lungs bilaterally, favoring mild multifocal infection over interstitial edema. 2. Small bilateral pleural effusions. Electronically signed by: Zadie Herter MD 09/14/2023 09:50 PM EDT RP Workstation: [id number]        Scheduled Meds:  ascorbic acid   500  mg Oral BID   enoxaparin  (LOVENOX ) injection  40 mg Subcutaneous Q24H   folic acid   1 mg Oral Daily   metoprolol tartrate  25 mg Oral BID   multivitamin with minerals  1 tablet Oral Daily   nicotine   21 mg Transdermal Daily   nystatin  1 Application Topical BID   pantoprazole   40 mg Oral Daily   sodium chloride   1 g Oral TID WC   thiamine   100 mg Oral Daily   Continuous Infusions:  ceFEPime (MAXIPIME) IV Stopped (09/15/23 0981)   vancomycin 1,250 mg (09/15/23 1021)     LOS: 1 day    Time spent: 55 minutes    Lillianne Eick Loran Rock, DO [facility]  If 7PM-7AM, please contact night-coverage [email or web address] 09/15/2023, 10:46 AM

## 2023-09-15 NOTE — Progress Notes (Signed)
 Pharmacy Antibiotic Note  Barry Fowler is a 68 y.o. male admitted on 09/14/2023 with pneumonia.  Pharmacy has been consulted for Vancomycin, Cefepime dosing.  Plan: Cefepime 2 gm IV X 1 in ED on 6/18 @ 2253. Cefepime 2 gm IV Q8H ordered to start on 6/19 @ 0700.  Vancomycin 1 gm IV X 1 given in ED on 6/18 @ 2315.  Additional Vanc 1 gm IV X 1 ordered to make total loading dose of 2 gm.  Vancomycin 1250 mg IV Q12H ordered to start on 6/19 @ 1100.  AUC = 462.8 Vanc trough = 12.5      Temp (24hrs), Avg:98.4 F (36.9 C), Min:98 F (36.7 C), Max:98.6 F (37 C)  Recent Labs  Lab 09/08/23 0318 09/09/23 0422 09/10/23 1438 09/11/23 0557 09/11/23 1412 09/12/23 0052 09/13/23 0327 09/14/23 2055 09/14/23 2319  WBC 9.7  --  9.0  --  9.7 9.2 8.4 12.1*  --   CREATININE 0.49* 0.44*  --  0.45*  --   --   --  0.62  --   LATICACIDVEN  --   --   --   --   --   --   --   --  0.9    Estimated Creatinine Clearance: 99.7 mL/min (by C-G formula based on SCr of 0.62 mg/dL).    No Known Allergies  Antimicrobials this admission:   >>    >>   Dose adjustments this admission:   Microbiology results:  BCx:   UCx:    Sputum:    MRSA PCR:   Thank you for allowing pharmacy to be a part of this patient's care.  Ricketta Colantonio D 09/15/2023 1:33 AM

## 2023-09-15 NOTE — ED Notes (Signed)
 Pt bedding and pants/underwear found to be soaked in urine after moved to rm 30. Pt had two urinals, one halfway full, at bedside prior to transfer from rm 2 to rm 30. Camilo Cella, RN, this EDT, and Janeeta, EDT cleaned pt up, changed patient into gown and brief. New urinal placed at bedside

## 2023-09-15 NOTE — ED Notes (Signed)
 MD made aware of trending new onset tachycardia 120-140's. No new orders at this time.

## 2023-09-15 NOTE — ED Notes (Signed)
 Pt adjusted in bed for comfort, tv remote given

## 2023-09-16 DIAGNOSIS — J189 Pneumonia, unspecified organism: Secondary | ICD-10-CM | POA: Diagnosis not present

## 2023-09-16 LAB — CBC
HCT: 29.7 % — ABNORMAL LOW (ref 39.0–52.0)
Hemoglobin: 10.4 g/dL — ABNORMAL LOW (ref 13.0–17.0)
MCH: 30.7 pg (ref 26.0–34.0)
MCHC: 35 g/dL (ref 30.0–36.0)
MCV: 87.6 fL (ref 80.0–100.0)
Platelets: 359 10*3/uL (ref 150–400)
RBC: 3.39 MIL/uL — ABNORMAL LOW (ref 4.22–5.81)
RDW: 14.5 % (ref 11.5–15.5)
WBC: 15.9 10*3/uL — ABNORMAL HIGH (ref 4.0–10.5)
nRBC: 0 % (ref 0.0–0.2)

## 2023-09-16 LAB — BASIC METABOLIC PANEL WITH GFR
Anion gap: 10 (ref 5–15)
BUN: 11 mg/dL (ref 8–23)
CO2: 23 mmol/L (ref 22–32)
Calcium: 8 mg/dL — ABNORMAL LOW (ref 8.9–10.3)
Chloride: 93 mmol/L — ABNORMAL LOW (ref 98–111)
Creatinine, Ser: 0.51 mg/dL — ABNORMAL LOW (ref 0.61–1.24)
GFR, Estimated: >60 mL/min (ref >60)
Glucose, Bld: 125 mg/dL — ABNORMAL HIGH (ref 70–99)
Potassium: 4.1 mmol/L (ref 3.5–5.1)
Sodium: 126 mmol/L — ABNORMAL LOW (ref 135–145)

## 2023-09-16 LAB — MAGNESIUM: Magnesium: 2.1 mg/dL (ref 1.7–2.4)

## 2023-09-16 LAB — CULTURE, BLOOD (ROUTINE X 2)

## 2023-09-16 NOTE — Progress Notes (Signed)
 PROGRESS NOTE    Barry Fowler  QMV:[id number] DOB: 07-04-1955 DOA: 09/14/2023 PCP: [facility]   Brief Narrative:    Barry Fowler is a 68 y.o. male with medical history significant of tobacco abuse, alcohol abuse, hypertension, chronic hyponatremia, who presents with SOB.   Patient was recently hospitalized from 6/9 - 6/17 due to severe hyponatremia with sodium 104 which improved to 124 at discharge; also had new onset A-fib, started on metoprolol not anticoagulant since patient is a poor candidate for anticoagulant use.  Patient also had possible pneumonia which was treated with Rocephin  and doxycycline  in hospital.   Patient states that today he developed worsening shortness of breath and cough with little mucus production.  Patient normally not using oxygen, but was found to have moderate respiratory distress, oxygen desaturation to 80% on room air, initially started nonrebreather, then changed to 5 L of oxygen ED with saturation 95%.  Patient has been admitted with acute hypoxemic respiratory failure secondary to suspected HCAP and has been started on vancomycin and cefepime.  He continues to have oxygen requirement and was noted to have atrial fibrillation with RVR yesterday and is now in sinus rhythm.  Assessment & Plan:   Principal Problem:   HCAP (healthcare-associated pneumonia) Active Problems:   Acute respiratory failure with hypoxia (HCC)   Paroxysmal atrial fibrillation (HCC)   HTN (hypertension)   Hyponatremia   Alcohol abuse   Tobacco abuse  Assessment and Plan:   Acute respiratory failure with hypoxia due to HCAP (healthcare-associated pneumonia): pt has worsening shortness of breath with moderate acute respiratory distress and 5 days of new oxygen requirement.  Chest x-ray showed increased interstitial markings in the lungs bilaterally, indicating HCAP.  Patient has elevated BNP 412, but no leg edema or JVD. Recent 2D echo on 09/12/2023 showed EF  of 55 to 60% with normal diastolic function; does not seem to have acute CHF.    - Will admit to PCU as inpt, ok for telemetry - IV cefepime to continue vancomycin discontinued as MRSA PCR negative - Mucinex  for cough  - Bronchodilators - Urine legionella and S. pneumococcal antigen - Blood cultures with no growth noted - IV Lasix given on 6/19, but blood pressures are currently soft and will avoid further diuresis for now   Paroxysmal atrial fibrillation with RVR (HCC)-now in sinus rhythm -Continue metoprolol - Monitor on telemetry   HTN (hypertension) -IV hydralazine as needed - Metoprolol -Plan to hold ARB given softer blood pressure readings   Hyponatremia: Sodium 126 which has improved from recent 104. - Continue sodium chloride  tablets 1 g 3 times daily - Fluid restriction   Alcohol abuse and Tobacco abuse: Patient did not drink alcohol since discharged to SNF. -did counseling about importance of quitting alcohol and tobacco -Nicotine  patch -continue folic acid  and Vitamin b1    DVT prophylaxis:Lovenox  Code Status: Full Family Communication: None at bedside Disposition Plan:  Status is: Inpatient Remains inpatient appropriate because: Need for IV medications   Consultants:  None  Procedures:  None  Antimicrobials:  Anti-infectives (From admission, onward)    Start     Dose/Rate Route Frequency Ordered Stop   09/15/23 1100  vancomycin (VANCOREADY) IVPB 1250 mg/250 mL  Status:  Discontinued        1,250 mg 166.7 mL/hr over 90 Minutes Intravenous Every 12 hours 09/15/23 0133 09/15/23 1200   09/15/23 0700  ceFEPIme (MAXIPIME) 2 g in sodium chloride  0.9 % 100 mL IVPB  2 g 200 mL/hr over 30 Minutes Intravenous Every 8 hours 09/15/23 0128     09/15/23 0130  vancomycin (VANCOCIN) IVPB 1000 mg/200 mL premix        1,000 mg 200 mL/hr over 60 Minutes Intravenous  Once 09/15/23 0129 09/15/23 0247   09/14/23 2200  ceFEPIme (MAXIPIME) 2 g in sodium chloride  0.9 %  100 mL IVPB        2 g 200 mL/hr over 30 Minutes Intravenous  Once 09/14/23 2157 09/14/23 2321   09/14/23 2200  vancomycin (VANCOCIN) IVPB 1000 mg/200 mL premix        1,000 mg 200 mL/hr over 60 Minutes Intravenous  Once 09/14/23 2157 09/15/23 0025       Subjective: Patient seen and evaluated today with no new acute complaints or concerns. No acute concerns or events noted overnight.  His heart rates have improved and he is now in sinus rhythm.  He continues to have 5.5 L oxygen requirement and is typically only on room air.  He still does not feel at baseline.  Objective: Vitals:   09/16/23 0500 09/16/23 0842 09/16/23 0843 09/16/23 1103  BP:  116/71  106/72  Pulse:  83 81 77  Resp:   19 18  Temp:   98.3 F (36.8 C) 98.7 F (37.1 C)  TempSrc:   Oral   SpO2:   94% 95%  Weight: 82.2 kg     Height:        Intake/Output Summary (Last 24 hours) at 09/16/2023 1214 Last data filed at 09/16/2023 1036 Gross per 24 hour  Intake 220 ml  Output 600 ml  Net -380 ml   Filed Weights   09/15/23 1740 09/16/23 0500  Weight: 81.5 kg 82.2 kg    Examination:  General exam: Appears calm and comfortable  Respiratory system: Clear to auscultation. Respiratory effort normal.  5.5 L nasal cannula Cardiovascular system: S1 & S2 heard, irregular and tachycardic Gastrointestinal system: Abdomen is soft Central nervous system: Alert and awake Extremities: No edema Skin: No significant lesions noted Psychiatry: Flat affect.    Data Reviewed: I have personally reviewed following labs and imaging studies  CBC: Recent Labs  Lab 09/12/23 0052 09/13/23 0327 09/14/23 2055 09/15/23 0511 09/16/23 0517  WBC 9.2 8.4 12.1* 11.1* 15.9*  NEUTROABS  --   --  8.9*  --   --   HGB 9.5* 9.4* 10.9* 11.1* 10.4*  HCT 28.2* 27.9* 31.5* 32.1* 29.7*  MCV 89.2 89.4 89.2 88.4 87.6  PLT 307 327 392 343 359   Basic Metabolic Panel: Recent Labs  Lab 09/10/23 1438 09/10/23 2237 09/11/23 0557  09/11/23 1100 09/12/23 2251 09/13/23 1123 09/14/23 2055 09/15/23 0511 09/16/23 0517  NA  --    < > 126*   < > 124* 124* 126* 125* 126*  K 4.2  --  4.2  --   --   --  4.0 3.8 4.1  CL  --   --  93*  --   --   --  93* 91* 93*  CO2  --   --  24  --   --   --  26 24 23   GLUCOSE  --   --  122*  --   --   --  149* 138* 125*  BUN  --   --  7*  --   --   --  9 7* 11  CREATININE  --   --  0.45*  --   --   --  0.62 0.49* 0.51*  CALCIUM   --   --  7.8*  --   --   --  8.3* 8.1* 8.0*  MG 1.9  --  1.9  --   --   --   --  1.6* 2.1   < > = values in this interval not displayed.   GFR: Estimated Creatinine Clearance: 99.7 mL/min (A) (by C-G formula based on SCr of 0.51 mg/dL (L)). Liver Function Tests: No results for input(s): AST, ALT, ALKPHOS, BILITOT, PROT, ALBUMIN in the last 168 hours. No results for input(s): LIPASE, AMYLASE in the last 168 hours. No results for input(s): AMMONIA in the last 168 hours. Coagulation Profile: No results for input(s): INR, PROTIME in the last 168 hours. Cardiac Enzymes: No results for input(s): CKTOTAL, CKMB, CKMBINDEX, TROPONINI in the last 168 hours. BNP (last 3 results) No results for input(s): PROBNP in the last 8760 hours. HbA1C: No results for input(s): HGBA1C in the last 72 hours. CBG: Recent Labs  Lab 09/10/23 2031 09/10/23 2357 09/11/23 0333 09/11/23 0752 09/12/23 2047  GLUCAP 142* 131* 124* 117* 126*   Lipid Profile: No results for input(s): CHOL, HDL, LDLCALC, TRIG, CHOLHDL, LDLDIRECT in the last 72 hours. Thyroid Function Tests: No results for input(s): TSH, T4TOTAL, FREET4, T3FREE, THYROIDAB in the last 72 hours. Anemia Panel: No results for input(s): VITAMINB12, FOLATE, FERRITIN, TIBC, IRON, RETICCTPCT in the last 72 hours. Sepsis Labs: Recent Labs  Lab 09/14/23 2055 09/14/23 2319 09/15/23 0511  PROCALCITON <0.10  --   --   LATICACIDVEN  --  0.9 0.8    Recent  Results (from the past 240 hours)  Resp panel by RT-PCR (RSV, Flu A&B, Covid) Anterior Nasal Swab     Status: None   Collection Time: 09/14/23 10:53 PM   Specimen: Anterior Nasal Swab  Result Value Ref Range Status   SARS Coronavirus 2 by RT PCR NEGATIVE NEGATIVE Final    Comment: (NOTE) SARS-CoV-2 target nucleic acids are NOT DETECTED.  The SARS-CoV-2 RNA is generally detectable in upper respiratory specimens during the acute phase of infection. The lowest concentration of SARS-CoV-2 viral copies this assay can detect is 138 copies/mL. A negative result does not preclude SARS-Cov-2 infection and should not be used as the sole basis for treatment or other patient management decisions. A negative result may occur with  improper specimen collection/handling, submission of specimen other than nasopharyngeal swab, presence of viral mutation(s) within the areas targeted by this assay, and inadequate number of viral copies(<138 copies/mL). A negative result must be combined with clinical observations, patient history, and epidemiological information. The expected result is Negative.  Fact Sheet for Patients:  BloggerCourse.com  Fact Sheet for Healthcare Providers:  SeriousBroker.it  This test is no t yet approved or cleared by the [place]  FDA and  has been authorized for detection and/or diagnosis of SARS-CoV-2 by FDA under an Emergency Use Authorization (EUA). This EUA will remain  in effect (meaning this test can be used) for the duration of the COVID-19 declaration under Section 564(b)(1) of the Act, 21 U.S.C.section 360bbb-3(b)(1), unless the authorization is terminated  or revoked sooner.       Influenza A by PCR NEGATIVE NEGATIVE Final   Influenza B by PCR NEGATIVE NEGATIVE Final    Comment: (NOTE) The Xpert Xpress SARS-CoV-2/FLU/RSV plus assay is intended as an aid in the diagnosis of influenza from Nasopharyngeal swab  specimens and should not be used as a sole basis for treatment. Nasal washings and aspirates  are unacceptable for Xpert Xpress SARS-CoV-2/FLU/RSV testing.  Fact Sheet for Patients: BloggerCourse.com  Fact Sheet for Healthcare Providers: SeriousBroker.it  This test is not yet approved or cleared by the [place]  FDA and has been authorized for detection and/or diagnosis of SARS-CoV-2 by FDA under an Emergency Use Authorization (EUA). This EUA will remain in effect (meaning this test can be used) for the duration of the COVID-19 declaration under Section 564(b)(1) of the Act, 21 U.S.C. section 360bbb-3(b)(1), unless the authorization is terminated or revoked.     Resp Syncytial Virus by PCR NEGATIVE NEGATIVE Final    Comment: (NOTE) Fact Sheet for Patients: BloggerCourse.com  Fact Sheet for Healthcare Providers: SeriousBroker.it  This test is not yet approved or cleared by the [place]  FDA and has been authorized for detection and/or diagnosis of SARS-CoV-2 by FDA under an Emergency Use Authorization (EUA). This EUA will remain in effect (meaning this test can be used) for the duration of the COVID-19 declaration under Section 564(b)(1) of the Act, 21 U.S.C. section 360bbb-3(b)(1), unless the authorization is terminated or revoked.  Performed at [facility], [address] [place], [place] [address]   Blood culture (routine x 2)     Status: None (Preliminary result)   Collection Time: 09/14/23 10:53 PM   Specimen: BLOOD  Result Value Ref Range Status   Specimen Description BLOOD LEFT HAND  Final   Special Requests   Final    BOTTLES DRAWN AEROBIC AND ANAEROBIC Blood Culture results may not be optimal due to an inadequate volume of blood received in culture bottles   Culture   Final    NO GROWTH 2 DAYS Performed at [facility], [address], [place], [place] [address]    Report Status PENDING  Incomplete  Blood culture (routine x 2)     Status: None (Preliminary result)   Collection Time: 09/14/23 11:19 PM   Specimen: BLOOD  Result Value Ref Range Status   Specimen Description BLOOD BLOOD RIGHT ARM  Final   Special Requests   Final    BOTTLES DRAWN AEROBIC AND ANAEROBIC Blood Culture results may not be optimal due to an inadequate volume of blood received in culture bottles   Culture   Final    NO GROWTH 2 DAYS Performed at [facility], [address] [place], [place] [address]    Report Status PENDING  Incomplete  MRSA Next Gen by PCR, Nasal     Status: None   Collection Time: 09/15/23 10:22 AM   Specimen: Nasal Mucosa; Nasal Swab  Result Value Ref Range Status   MRSA by PCR Next Gen NOT DETECTED NOT DETECTED Final    Comment: (NOTE) The GeneXpert MRSA Assay (FDA approved for NASAL specimens only), is one component of a comprehensive MRSA colonization surveillance program. It is not intended to diagnose MRSA infection nor to guide or monitor treatment for MRSA infections. Test performance is not FDA approved in patients less than 47 years old. Performed at [facility], [address] [place], [place] [address]   Expectorated Sputum Assessment w Gram Stain, Rflx to Resp Cult     Status: None   Collection Time: 09/15/23  6:40 PM   Specimen: Sputum  Result Value Ref Range Status   Specimen Description SPUTUM  Final   Special Requests EXPSU  Final   Sputum evaluation   Final    Sputum specimen not acceptable for testing.  Please recollect.   RESULT CALLED TO, READ  BACK BY AND VERIFIED WITH: RYAN AINSWORTH @2027  ON 09/15/23 SKL Performed at [facility], [address] [place], [place] [address]    Report Status 09/15/2023 FINAL  Final         Radiology Studies: DG Chest Portable 1 View Result Date: 09/14/2023 EXAM: 1 VIEW XRAY OF THE CHEST 09/14/2023 09:42:12 PM COMPARISON:  09/13/2023 CLINICAL HISTORY: SOB. From [facility]. First day at the facility. Facility called for sudden SHOB, in the 82s on RA. EMS gave NRB at 15L Sats to mid 90s. Was discharged from [facility] for ETOH and fall; Chest Xray today from facility states CHF, bilateral pleural effusions, Bi-basilar atelectasis. FINDINGS: LUNGS AND PLEURA: Small bilateral pleural effusions. Increased interstitial markings in the lungs bilaterally, favoring mild multifocal infection over interstitial edema. Bilateral lower lobe opacities, likely atelectasis. HEART AND MEDIASTINUM: No acute abnormality of the cardiac and mediastinal silhouettes. BONES AND SOFT TISSUES: No acute osseous abnormality. IMPRESSION: 1. Increased interstitial markings in the lungs bilaterally, favoring mild multifocal infection over interstitial edema. 2. Small bilateral pleural effusions. Electronically signed by: Zadie Herter MD 09/14/2023 09:50 PM EDT RP Workstation: [id number]        Scheduled Meds:  ascorbic acid   500 mg Oral BID   enoxaparin  (LOVENOX ) injection  40 mg Subcutaneous Q24H   folic acid   1 mg Oral Daily   metoprolol tartrate  25 mg Oral BID   multivitamin with minerals  1 tablet Oral Daily   nicotine   21 mg Transdermal Daily   nystatin  1 Application Topical BID   pantoprazole   40 mg Oral Daily   sodium chloride   1 g Oral TID WC   thiamine   100 mg Oral Daily   Continuous Infusions:  ceFEPime (MAXIPIME) IV 2 g (09/16/23 0837)   diltiazem (CARDIZEM) infusion Stopped (09/15/23 1745)     LOS: 2 days    Time spent: 55 minutes    Saory Carriero D Mason Sole, DO [facility]  If 7PM-7AM, please contact night-coverage [email or web address] 09/16/2023, 12:14 PM

## 2023-09-16 NOTE — Plan of Care (Signed)

## 2023-09-16 NOTE — Care Management Important Message (Signed)
 Important Message  Patient Details  Name: Barry Fowler MRN: [id number] Date of Birth: 1955/11/03   Important Message Given:  Yes - Medicare IM     Anise Kerns 09/16/2023, 12:34 PM

## 2023-09-17 ENCOUNTER — Inpatient Hospital Stay

## 2023-09-17 DIAGNOSIS — J189 Pneumonia, unspecified organism: Secondary | ICD-10-CM | POA: Diagnosis not present

## 2023-09-17 LAB — LEGIONELLA PNEUMOPHILA SEROGP 1 UR AG: L. pneumophila Serogp 1 Ur Ag: NEGATIVE

## 2023-09-17 LAB — BASIC METABOLIC PANEL WITH GFR
Anion gap: 8 (ref 5–15)
Anion gap: 10 (ref 5–15)
BUN: 12 mg/dL (ref 8–23)
BUN: 13 mg/dL (ref 8–23)
CO2: 23 mmol/L (ref 22–32)
CO2: 22 mmol/L (ref 22–32)
Calcium: 8 mg/dL — ABNORMAL LOW (ref 8.9–10.3)
Calcium: 7.9 mg/dL — ABNORMAL LOW (ref 8.9–10.3)
Chloride: 91 mmol/L — ABNORMAL LOW (ref 98–111)
Chloride: 90 mmol/L — ABNORMAL LOW (ref 98–111)
Creatinine, Ser: 0.47 mg/dL — ABNORMAL LOW (ref 0.61–1.24)
Creatinine, Ser: 0.44 mg/dL — ABNORMAL LOW (ref 0.61–1.24)
GFR, Estimated: >60 mL/min (ref >60)
GFR, Estimated: >60 mL/min (ref >60)
Glucose, Bld: 127 mg/dL — ABNORMAL HIGH (ref 70–99)
Glucose, Bld: 153 mg/dL — ABNORMAL HIGH (ref 70–99)
Potassium: 4 mmol/L (ref 3.5–5.1)
Potassium: 4 mmol/L (ref 3.5–5.1)
Sodium: 122 mmol/L — ABNORMAL LOW (ref 135–145)
Sodium: 122 mmol/L — ABNORMAL LOW (ref 135–145)

## 2023-09-17 LAB — CBC
HCT: 30.8 % — ABNORMAL LOW (ref 39.0–52.0)
Hemoglobin: 10.6 g/dL — ABNORMAL LOW (ref 13.0–17.0)
MCH: 30.6 pg (ref 26.0–34.0)
MCHC: 34.4 g/dL (ref 30.0–36.0)
MCV: 89 fL (ref 80.0–100.0)
Platelets: 371 10*3/uL (ref 150–400)
RBC: 3.46 MIL/uL — ABNORMAL LOW (ref 4.22–5.81)
RDW: 14.6 % (ref 11.5–15.5)
WBC: 14.2 10*3/uL — ABNORMAL HIGH (ref 4.0–10.5)
nRBC: 0 % (ref 0.0–0.2)

## 2023-09-17 LAB — BRAIN NATRIURETIC PEPTIDE: B Natriuretic Peptide: 207.4 pg/mL — ABNORMAL HIGH (ref 0.0–100.0)

## 2023-09-17 LAB — MAGNESIUM: Magnesium: 1.9 mg/dL (ref 1.7–2.4)

## 2023-09-17 MED ORDER — SODIUM CHLORIDE 1 G PO TABS
1.0000 g | ORAL_TABLET | Freq: Three times a day (TID) | ORAL | Status: DC
  Administered 2023-09-17 – 2023-09-23 (×19): 1 g via ORAL
  Filled 2023-09-17 (×19): qty 1

## 2023-09-17 MED ORDER — ALBUMIN HUMAN 25 % IV SOLN
25.0000 g | Freq: Two times a day (BID) | INTRAVENOUS | Status: AC
  Administered 2023-09-17 – 2023-09-18 (×4): 25 g via INTRAVENOUS
  Filled 2023-09-17 (×4): qty 100

## 2023-09-17 MED ORDER — SODIUM CHLORIDE 1 G PO TABS: 2.0000 g | ORAL_TABLET | Freq: Three times a day (TID) | ORAL | Status: DC

## 2023-09-17 MED ORDER — FUROSEMIDE 10 MG/ML IJ SOLN
4.0000 mg/h | INTRAVENOUS | Status: DC
  Administered 2023-09-17: 4 mg/h via INTRAVENOUS
  Filled 2023-09-17: qty 20

## 2023-09-17 MED ORDER — KETOROLAC TROMETHAMINE 30 MG/ML IJ SOLN
30.0000 mg | Freq: Once | INTRAMUSCULAR | Status: AC
  Administered 2023-09-17: 30 mg via INTRAVENOUS
  Filled 2023-09-17: qty 1

## 2023-09-17 MED ORDER — ENSURE PLUS HIGH PROTEIN PO LIQD
237.0000 mL | Freq: Two times a day (BID) | ORAL | Status: DC
  Administered 2023-09-17 – 2023-09-23 (×11): 237 mL via ORAL

## 2023-09-17 MED ORDER — LIDOCAINE 5 % EX PTCH
1.0000 | MEDICATED_PATCH | CUTANEOUS | Status: DC
  Administered 2023-09-17 – 2023-09-23 (×4): 1 via TRANSDERMAL
  Filled 2023-09-17 (×7): qty 1

## 2023-09-17 NOTE — Plan of Care (Signed)
  Problem: Education: Goal: Knowledge of General Education information will improve Description: Including pain rating scale, medication(s)/side effects and non-pharmacologic comfort measures Outcome: Progressing   Problem: Health Behavior/Discharge Planning: Goal: Ability to manage health-related needs will improve Outcome: Progressing   Problem: Clinical Measurements: Goal: Ability to maintain clinical measurements within normal limits will improve Outcome: Progressing Goal: Will remain free from infection Outcome: Progressing Goal: Diagnostic test results will improve Outcome: Progressing Goal: Respiratory complications will improve Outcome: Progressing Goal: Cardiovascular complication will be avoided Outcome: Progressing   Problem: Activity: Goal: Risk for activity intolerance will decrease Outcome: Progressing   Problem: Nutrition: Goal: Adequate nutrition will be maintained Outcome: Progressing   Problem: Coping: Goal: Level of anxiety will decrease Outcome: Progressing   Problem: Pain Managment: Goal: General experience of comfort will improve and/or be controlled Outcome: Progressing   Problem: Elimination: Goal: Will not experience complications related to bowel motility Outcome: Progressing Goal: Will not experience complications related to urinary retention Outcome: Progressing   Problem: Safety: Goal: Ability to remain free from injury will improve Outcome: Progressing   Problem: Skin Integrity: Goal: Risk for impaired skin integrity will decrease Outcome: Progressing   Problem: Respiratory: Goal: Ability to maintain adequate ventilation will improve Outcome: Progressing Goal: Ability to maintain a clear airway will improve Outcome: Progressing   Problem: Clinical Measurements: Goal: Ability to maintain a body temperature in the normal range will improve Outcome: Progressing

## 2023-09-17 NOTE — Evaluation (Signed)
 Occupational Therapy Evaluation Patient Details Name: Barry Fowler MRN: [id number] DOB: 1955-04-10 Today's Date: 09/17/2023   History of Present Illness   Pt is a 68 year old male presents with SOB, admitted with acute hypoxemic respiratory failure secondary to suspected HCAP    PMH significant for tobacco abuse, alcohol abuse, hypertension, chronic hyponatremia     Clinical Impressions Chart reviewed, pt greeted in bed, oriented to self, place, date, not oriented to situation. Increased time and cues required for processing/one step direction following. PTA pt was at rehab, prior to that he was MOD I-I in ADL per chart, pt unable to provide PLOF/home set up at time of this evaluation. Pt presents with deficits in strength, endurance, activity tolerance, balance, cognition, affecting safe and optimal ADL completion. Bed mobility completed with MIN A, STS with MOD A +2with RW, short amb transfer with MIN A +2, ataxic gait, wide BOS with frequent multi modal cues for technique. MIN-MOD A required for grooming/UB dressing tasks. Please see further details below, recommend post acute OT< 3 hrs to address functional deficits and to facilitate return to PLOF. Pt is left in bedside chair, all needs met. OT will continue to follow acutely.      If plan is discharge home, recommend the following:   Two people to help with walking and/or transfers;Two people to help with bathing/dressing/bathroom;Help with stairs or ramp for entrance     Functional Status Assessment   Patient has had a recent decline in their functional status and demonstrates the ability to make significant improvements in function in a reasonable and predictable amount of time.     Equipment Recommendations   Other (comment) (defer to next venue of care)     Recommendations for Other Services         Precautions/Restrictions   Precautions Precautions: Fall Recall of Precautions/Restrictions:  Impaired Restrictions Weight Bearing Restrictions Per Provider Order: No     Mobility Bed Mobility Overal bed mobility: Needs Assistance Bed Mobility: Supine to Sit     Supine to sit: Min assist          Transfers Overall transfer level: Needs assistance Equipment used: Rolling walker (2 wheels) Transfers: Sit to/from Stand Sit to Stand: Mod assist, +2 safety/equipment                  Balance Overall balance assessment: Needs assistance Sitting-balance support: No upper extremity supported, Feet supported Sitting balance-Leahy Scale: Fair     Standing balance support: Bilateral upper extremity supported, Reliant on assistive device for balance Standing balance-Leahy Scale: Poor                             ADL either performed or assessed with clinical judgement   ADL Overall ADL's : Needs assistance/impaired     Grooming: Oral care;Sitting;Minimal assistance;Cueing for sequencing           Upper Body Dressing : Sitting;Moderate assistance;Cueing for sequencing   Lower Body Dressing: Maximal assistance;Sitting/lateral leans;Cueing for sequencing   Toilet Transfer: Minimal assistance;Rolling walker (2 wheels);Ambulation;Cueing for safety;Cueing for sequencing;+2 for safety/equipment;+2 for physical assistance   Toileting- Clothing Manipulation and Hygiene: Maximal assistance;Sit to/from stand       Functional mobility during ADLs: Minimal assistance;+2 for physical assistance;+2 for safety/equipment;Rolling walker (2 wheels);Cueing for safety;Cueing for sequencing (approx 5' in room; ataxic with wide BOS)       Vision   Vision Assessment?: Yes Eye Alignment: Within Functional  Limits Alignment/Gaze Preference: Within Defined Limits Tracking/Visual Pursuits: Able to track stimulus in all quads without difficulty Visual Fields: No apparent deficits     Perception         Praxis         Pertinent Vitals/Pain Pain Assessment Pain  Assessment: Faces Faces Pain Scale: Hurts a little bit Pain Location: low back Pain Descriptors / Indicators: Aching, Sore Pain Intervention(s): Monitored during session, Repositioned, Limited activity within patient's tolerance     Extremity/Trunk Assessment Upper Extremity Assessment Upper Extremity Assessment: LUE deficits/detail;RUE deficits/detail RUE Deficits / Details: AROM grossly WFL; grossly 4/5 strength throughout LUE Deficits / Details: AROM grossly WFL; grossly 4/5 strength throughout   Lower Extremity Assessment Lower Extremity Assessment: Generalized weakness       Communication Communication Communication: No apparent difficulties   Cognition Arousal: Alert Behavior During Therapy: WFL for tasks assessed/performed, Impulsive, Flat affect Cognition: No family/caregiver present to determine baseline, Cognition impaired   Orientation impairments: Situation Awareness: Online awareness impaired Memory impairment (select all impairments): Declarative long-term memory Attention impairment (select first level of impairment): Sustained attention Executive functioning impairment (select all impairments): Reasoning, Problem solving                   Following commands: Impaired Following commands impaired: Follows one step commands with increased time     Cueing  General Comments   Cueing Techniques: Verbal cues;Gestural cues;Tactile cues  spo2 >90% on 5 L via [place] throughout, scratches noted on upper R arm   Exercises Other Exercises Other Exercises: edu re: role of OT, role of rehab   Shoulder Instructions      Home Living Family/patient expects to be discharged to:: Private residence Living Arrangements: Alone Available Help at Discharge: Family;Available PRN/intermittently Type of Home: Apartment Home Access: Elevator     Home Layout: One level               Home Equipment: Rollator (4 wheels)   Additional Comments: 2nd floor apartment-  information from previous recent admission, pt unable to provide home set up/PLOF on this date      Prior Functioning/Environment Prior Level of Function : Patient poor historian/Family not available             Mobility Comments: per chart, amb with rollator household mobility prior to recent admission to hospital and discharge to rehab ADLs Comments: per chart sister checks on him a few times a week and PRN assist for IADLs; generally MOD I with ADL; since recent hospital admission requiring assist for ADL/IADL    OT Problem List: Decreased strength;Decreased range of motion;Decreased activity tolerance;Impaired balance (sitting and/or standing)   OT Treatment/Interventions: Self-care/ADL training;Therapeutic exercise;Energy conservation;DME and/or AE instruction;Therapeutic activities      OT Goals(Current goals can be found in the care plan section)   Acute Rehab OT Goals Patient Stated Goal: brush teeth OT Goal Formulation: With patient Time For Goal Achievement: 10/01/23 Potential to Achieve Goals: Good ADL Goals Pt Will Perform Grooming: with min assist;sitting;standing Pt Will Perform Lower Body Dressing: with min assist;sit to/from stand;sitting/lateral leans Pt Will Transfer to Toilet: with min assist;ambulating Pt Will Perform Toileting - Clothing Manipulation and hygiene: with min assist;sitting/lateral leans;sit to/from stand   OT Frequency:  Min 2X/week    Co-evaluation PT/OT/SLP Co-Evaluation/Treatment: Yes Reason for Co-Treatment: For patient/therapist safety;To address functional/ADL transfers PT goals addressed during session: Mobility/safety with mobility OT goals addressed during session: Proper use of Adaptive equipment and DME  AM-PAC OT 6 Clicks Daily Activity     Outcome Measure Help from another person eating meals?: A Little Help from another person taking care of personal grooming?: A Little Help from another person toileting, which  includes using toliet, bedpan, or urinal?: A Lot Help from another person bathing (including washing, rinsing, drying)?: A Lot Help from another person to put on and taking off regular upper body clothing?: A Lot Help from another person to put on and taking off regular lower body clothing?: A Lot 6 Click Score: 14   End of Session Equipment Utilized During Treatment: Gait belt;Rolling walker (2 wheels);Oxygen Nurse Communication: Mobility status  Activity Tolerance: Patient tolerated treatment well Patient left: with call bell/phone within reach;in chair;with chair alarm set  OT Visit Diagnosis: Other abnormalities of gait and mobility (R26.89);Muscle weakness (generalized) (M62.81)                Time: 8590-8566 OT Time Calculation (min): 24 min Charges:  OT General Charges $OT Visit: 1 Visit OT Evaluation $OT Eval Moderate Complexity: 1 Mod  Therisa Sheffield, OTD OTR/L  09/17/23, 3:52 PM

## 2023-09-17 NOTE — Evaluation (Signed)
 Physical Therapy Evaluation Patient Details Name: Barry Fowler MRN: [id number] DOB: June 10, 1955 Today's Date: 09/17/2023  History of Present Illness  Pt is a 68 yo male that presented to ED for SOB, from SNF. Recent admission for severe hyponatremia, also with new onset afib. Workup for acute hypoxeic respiratory failure 2/2 suspected HCAP. PMH of EtOH abuse, tobacco abuse, hypertension, chronic hyponatremia and recurrent falls.   Clinical Impression  Patient alert, agreeable to PT/OT with some encouragement, overall displayed fatigue. Per recently discharged to SNF after hospital admission, but prior to that was living home alone, independent in ADLs. He was able to perform bed mobility with minA, extra time and use of bed rails. Fair sitting balance noted. BP assessed in supine and in sitting, no significant change noted. He was able to stand minA with RW, and with minAx2 take several steps in room towards recliner. Pt with ataxic gait including very wide BOS. Left with OT at bedside for ADLs.  Overall the patient demonstrated deficits (see PT Problem List) that impede the patient's functional abilities, safety, and mobility and would benefit from skilled PT intervention.   spO2 monitored, on 5L >90% throughout mobility.         If plan is discharge home, recommend the following: Two people to help with walking and/or transfers;Two people to help with bathing/dressing/bathroom;Help with stairs or ramp for entrance;Assist for transportation;Assistance with cooking/housework;Supervision due to cognitive status   Can travel by private vehicle   No    Equipment Recommendations Other (comment) (TBD)  Recommendations for Other Services       Functional Status Assessment Patient has had a recent decline in their functional status and demonstrates the ability to make significant improvements in function in a reasonable and predictable amount of time.     Precautions / Restrictions  Precautions Precautions: Fall Recall of Precautions/Restrictions: Intact Restrictions Weight Bearing Restrictions Per Provider Order: No      Mobility  Bed Mobility Overal bed mobility: Needs Assistance Bed Mobility: Supine to Sit     Supine to sit: Min assist          Transfers Overall transfer level: Needs assistance Equipment used: Rolling walker (2 wheels) Transfers: Sit to/from Stand Sit to Stand: Mod assist           General transfer comment: modA for steadying    Ambulation/Gait Ambulation/Gait assistance: Min assist, +2 physical assistance Gait Distance (Feet): 4 Feet Assistive device: Rolling walker (2 wheels)         General Gait Details: generally ataxic wiht +2 needed for hand on safety  Stairs            Wheelchair Mobility     Tilt Bed    Modified Rankin (Stroke Patients Only)       Balance Overall balance assessment: Needs assistance Sitting-balance support: No upper extremity supported, Feet supported Sitting balance-Leahy Scale: Fair Sitting balance - Comments: progressed to supervision   Standing balance support: Bilateral upper extremity supported, Reliant on assistive device for balance Standing balance-Leahy Scale: Poor                               Pertinent Vitals/Pain Pain Assessment Pain Assessment: Faces Faces Pain Scale: Hurts a little bit Pain Location: low back Pain Descriptors / Indicators: Aching, Sore Pain Intervention(s): Limited activity within patient's tolerance, Monitored during session, Repositioned    Home Living Family/patient expects to be discharged to:: Private residence Living  Arrangements: Alone Available Help at Discharge: Family;Available PRN/intermittently Type of Home: Apartment Home Access: Elevator       Home Layout: One level Home Equipment: Rollator (4 wheels) Additional Comments: 2nd floor apartment    Prior Function Prior Level of Function :  Independent/Modified Independent;History of Falls (last six months)             Mobility Comments: reports x3 falls this year, household mobility with 4WW ADLs Comments: sister checks on him couple times/week, assist with meals/transportation     Extremity/Trunk Assessment   Upper Extremity Assessment Upper Extremity Assessment: Defer to OT evaluation    Lower Extremity Assessment Lower Extremity Assessment: Generalized weakness       Communication   Communication Communication: No apparent difficulties    Cognition Arousal: Alert Behavior During Therapy: WFL for tasks assessed/performed, Impulsive   PT - Cognitive impairments: No family/caregiver present to determine baseline, Difficult to assess                       PT - Cognition Comments: slower processing, decreased initation Following commands: Intact       Cueing Cueing Techniques: Verbal cues, Gestural cues, Tactile cues     General Comments      Exercises     Assessment/Plan    PT Assessment Patient needs continued PT services  PT Problem List Decreased strength;Decreased activity tolerance;Decreased balance;Decreased mobility;Decreased safety awareness;Decreased knowledge of use of DME       PT Treatment Interventions DME instruction;Balance training;Neuromuscular re-education;Stair training;Gait training;Functional mobility training;Patient/family education;Therapeutic activities;Therapeutic exercise    PT Goals (Current goals can be found in the Care Plan section)  Acute Rehab PT Goals Patient Stated Goal: to get his strength back PT Goal Formulation: With patient Time For Goal Achievement: 10/01/23 Potential to Achieve Goals: Good    Frequency Min 2X/week     Co-evaluation PT/OT/SLP Co-Evaluation/Treatment: Yes Reason for Co-Treatment: For patient/therapist safety;To address functional/ADL transfers PT goals addressed during session: Mobility/safety with mobility OT goals  addressed during session: Proper use of Adaptive equipment and DME       AM-PAC PT 6 Clicks Mobility  Outcome Measure Help needed turning from your back to your side while in a flat bed without using bedrails?: A Little Help needed moving from lying on your back to sitting on the side of a flat bed without using bedrails?: A Little Help needed moving to and from a bed to a chair (including a wheelchair)?: A Lot Help needed standing up from a chair using your arms (e.g., wheelchair or bedside chair)?: A Lot Help needed to walk in hospital room?: A Lot Help needed climbing 3-5 steps with a railing? : Total 6 Click Score: 13    End of Session Equipment Utilized During Treatment: Gait belt;Oxygen Activity Tolerance: Patient limited by fatigue Patient left: with call bell/phone within reach;in chair;with chair alarm set Nurse Communication: Mobility status PT Visit Diagnosis: Other abnormalities of gait and mobility (R26.89);Difficulty in walking, not elsewhere classified (R26.2);Muscle weakness (generalized) (M62.81)    Time: 8591-8573 PT Time Calculation (min) (ACUTE ONLY): 18 min   Charges:   PT Evaluation $PT Eval Moderate Complexity: 1 Mod PT Treatments $Therapeutic Activity: 8-22 mins PT General Charges $$ ACUTE PT VISIT: 1 Visit       Doyal Shams PT, DPT 3:34 PM,09/17/23

## 2023-09-17 NOTE — TOC Initial Note (Signed)
 Transition of Care [facility]) - Initial/Assessment Note    Patient Details  Name: Barry Fowler MRN: [id number] Date of Birth: 05-20-1955  [facility]) CM/SW Contact:    Quintella Suzen Jansky, RN Phone Number: 09/17/2023, 10:26 AM  Clinical Narrative:                 Patient recently discharged to [facility] for short term rehab. TOC will continue to monitor for discharge needs.   Expected Discharge Plan: Skilled Nursing Facility Barriers to Discharge: Continued Medical Work up   Patient Goals and CMS Choice   CMS Medicare.gov Compare Post Acute Care list provided to:: Patient Choice offered to / list presented to : Patient      Expected Discharge Plan and Services       Living arrangements for the past 2 months: Apartment                   DME Agency: NA       HH Arranged: NA          Prior Living Arrangements/Services Living arrangements for the past 2 months: Apartment Lives with:: Self Patient language and need for interpreter reviewed:: Yes Do you feel safe going back to the place where you live?: Yes      Need for Family Participation in Patient Care: Yes (Comment) Care giver support system in place?: No (comment) Current home services: DME Criminal Activity/Legal Involvement Pertinent to Current Situation/Hospitalization: No - Comment as needed  Activities of Daily Living      Permission Sought/Granted                  Emotional Assessment Appearance:: Appears stated age     Orientation: : Oriented to Self, Oriented to Place, Oriented to  Time, Oriented to Situation Alcohol / Substance Use: Not Applicable Psych Involvement: No (comment)  Admission diagnosis:  Shortness of breath [R06.02] Hypoxia [R09.02] HCAP (healthcare-associated pneumonia) [J18.9] Hypervolemia, unspecified hypervolemia type [E87.70] Pneumonia due to infectious organism, unspecified laterality, unspecified part of lung [J18.9] Patient Active Problem List    Diagnosis Date Noted   HCAP (healthcare-associated pneumonia) 09/14/2023   Acute respiratory failure with hypoxia (HCC) 09/14/2023   HTN (hypertension) 09/14/2023   Tobacco abuse 09/14/2023   Paroxysmal atrial fibrillation (HCC) 09/10/2023   Pressure injury of skin 09/10/2023   Malnutrition of moderate degree 09/07/2023   Acute metabolic encephalopathy 09/05/2023   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, [place] - [address] [place] [address] Phone: [phone] Fax: [phone]  [facility] - [place], [place] - [address] [place] [address] Phone: [phone] Fax: [phone]     Social Drivers of Health (SDOH) Social History: SDOH Screenings   Food Insecurity: No Food Insecurity (09/16/2023)  Housing: Low Risk  (09/16/2023)  Transportation Needs: No Transportation Needs (09/16/2023)  Utilities: Not At Risk (09/16/2023)  Social Connections: Unknown (09/16/2023)  Tobacco Use: High Risk (09/15/2023)   SDOH Interventions:     Readmission Risk Interventions     No data to display

## 2023-09-17 NOTE — Progress Notes (Addendum)
 PROGRESS NOTE    Barry Fowler  FMW:[id number] DOB: 01/25/1956 DOA: 09/14/2023 PCP: [facility]   Brief Narrative:    Barry Fowler is a 68 y.o. male with medical history significant of tobacco abuse, alcohol abuse, hypertension, chronic hyponatremia, who presents with SOB.   Patient was recently hospitalized from 6/9 - 6/17 due to severe hyponatremia with sodium 104 which improved to 124 at discharge; also had new onset A-fib, started on metoprolol  not anticoagulant since patient is a poor candidate for anticoagulant use.  Patient also had possible pneumonia which was treated with Rocephin  and doxycycline  in hospital.   Patient states that today he developed worsening shortness of breath and cough with little mucus production.  Patient normally not using oxygen, but was found to have moderate respiratory distress, oxygen desaturation to 80% on room air, initially started nonrebreather, then changed to 5 L of oxygen ED with saturation 95%.  Patient has been admitted with acute hypoxemic respiratory failure secondary to suspected HCAP and has been started on vancomycin  and cefepime .  He continues to have oxygen requirement and was noted to have atrial fibrillation with RVR yesterday and is now in sinus rhythm.  He continues to have significant hypoxemia requiring 5 L nasal cannula and has downward trending sodium levels.  Chest x-ray shows ongoing pulmonary edema and case discussed with nephrology with plans for IV Lasix  drip and albumin  infusions due to soft blood pressure readings.  PT/OT evaluation ordered and pending.  Assessment & Plan:   Principal Problem:   HCAP (healthcare-associated pneumonia) Active Problems:   Acute respiratory failure with hypoxia (HCC)   Paroxysmal atrial fibrillation (HCC)   HTN (hypertension)   Hyponatremia   Alcohol abuse   Tobacco abuse  Assessment and Plan:   Acute respiratory failure with hypoxia due to HCAP and volume overload  with concern for acute HFpEF : pt has worsening shortness of breath with moderate acute respiratory distress and 5 days of new oxygen requirement.  Chest x-ray showed increased interstitial markings in the lungs bilaterally, indicating HCAP.  Patient has elevated BNP 412, but no leg edema or JVD. Recent 2D echo on 09/12/2023 showed EF of 55 to 60% with normal diastolic function; does not seem to have acute CHF.    - Remains on 5 L nasal cannula, wean as tolerated -Incentive spirometry - IV cefepime  to continue vancomycin  discontinued as MRSA PCR negative - Mucinex  for cough  - Bronchodilators - Urine legionella and S. pneumococcal antigen - Blood cultures with no growth noted - Will be started on Lasix  infusion as well as albumin  doses given soft blood pressure readings and chest x-ray demonstrating pulmonary edema after discussion with nephrology   Paroxysmal atrial fibrillation with RVR (HCC)-now in sinus rhythm -Continue metoprolol  - Monitor on telemetry   HTN (hypertension) -IV hydralazine as needed - Metoprolol  -Plan to hold ARB given softer blood pressure readings   Hyponatremia-hypervolemic, downtrending -Sodium level has dropped to 122 today and likely will continue to remain low for the most part - Fluid restriction of 1200 mL and sodium chloride  tablets to continue -Lasix  drip with albumin  infusions ordered   Alcohol abuse and Tobacco abuse: Patient did not drink alcohol since discharged to SNF. -did counseling about importance of quitting alcohol and tobacco -Nicotine  patch -continue folic acid  and Vitamin b1    DVT prophylaxis:Lovenox  Code Status: Full Family Communication: None at bedside Disposition Plan:  Status is: Inpatient Remains inpatient appropriate because: Need for IV medications   Consultants:  Discussed case with Dr. Marcelino nephrology 6/21  Procedures:  None  Antimicrobials:  Anti-infectives (From admission, onward)    Start     Dose/Rate Route  Frequency Ordered Stop   09/15/23 1100  vancomycin  (VANCOREADY) IVPB 1250 mg/250 mL  Status:  Discontinued        1,250 mg 166.7 mL/hr over 90 Minutes Intravenous Every 12 hours 09/15/23 0133 09/15/23 1200   09/15/23 0700  ceFEPIme  (MAXIPIME ) 2 g in sodium chloride  0.9 % 100 mL IVPB        2 g 200 mL/hr over 30 Minutes Intravenous Every 8 hours 09/15/23 0128     09/15/23 0130  vancomycin  (VANCOCIN ) IVPB 1000 mg/200 mL premix        1,000 mg 200 mL/hr over 60 Minutes Intravenous  Once 09/15/23 0129 09/15/23 0247   09/14/23 2200  ceFEPIme  (MAXIPIME ) 2 g in sodium chloride  0.9 % 100 mL IVPB        2 g 200 mL/hr over 30 Minutes Intravenous  Once 09/14/23 2157 09/14/23 2321   09/14/23 2200  vancomycin  (VANCOCIN ) IVPB 1000 mg/200 mL premix        1,000 mg 200 mL/hr over 60 Minutes Intravenous  Once 09/14/23 2157 09/15/23 0025       Subjective: Patient seen and evaluated today with no new acute complaints or concerns. No acute concerns or events noted overnight.  He remains in sinus rhythm with stable heart rates, but continues to have high oxygen requirements of 5 L.  Repeat chest x-ray with pulmonary edema noted and case discussed with nephrology with plans for Lasix  infusion with albumin  due to soft blood pressure readings.  Objective: Vitals:   09/17/23 0500 09/17/23 0721 09/17/23 0947 09/17/23 1214  BP:  107/68  99/65  Pulse:  90 89 80  Resp:  16  (!) 25  Temp:  98.7 F (37.1 C)    TempSrc:      SpO2:  96% 90% 93%  Weight: 82 kg     Height:        Intake/Output Summary (Last 24 hours) at 09/17/2023 1302 Last data filed at 09/17/2023 0950 Gross per 24 hour  Intake 360.45 ml  Output 900 ml  Net -539.55 ml   Filed Weights   09/15/23 1740 09/16/23 0500 09/17/23 0500  Weight: 81.5 kg 82.2 kg 82 kg    Examination:  General exam: Appears calm and comfortable  Respiratory system: Mild congestion bilaterally. Respiratory effort normal.  5.5 L nasal cannula Cardiovascular  system: S1 & S2 heard, irregular and tachycardic Gastrointestinal system: Abdomen is soft Central nervous system: Alert and awake Extremities: No edema Skin: No significant lesions noted Psychiatry: Flat affect.    Data Reviewed: I have personally reviewed following labs and imaging studies  CBC: Recent Labs  Lab 09/13/23 0327 09/14/23 2055 09/15/23 0511 09/16/23 0517 09/17/23 0442  WBC 8.4 12.1* 11.1* 15.9* 14.2*  NEUTROABS  --  8.9*  --   --   --   HGB 9.4* 10.9* 11.1* 10.4* 10.6*  HCT 27.9* 31.5* 32.1* 29.7* 30.8*  MCV 89.4 89.2 88.4 87.6 89.0  PLT 327 392 343 359 371   Basic Metabolic Panel: Recent Labs  Lab 09/10/23 1438 09/10/23 2237 09/11/23 0557 09/11/23 1100 09/14/23 2055 09/15/23 0511 09/16/23 0517 09/17/23 0442 09/17/23 0947  NA  --    < > 126*   < > 126* 125* 126* 122* 122*  K 4.2  --  4.2  --  4.0 3.8 4.1 4.0  4.0  CL  --    < > 93*  --  93* 91* 93* 91* 90*  CO2  --    < > 24  --  26 24 23 23 22   GLUCOSE  --    < > 122*  --  149* 138* 125* 127* 153*  BUN  --    < > 7*  --  9 7* 11 12 13   CREATININE  --    < > 0.45*  --  0.62 0.49* 0.51* 0.47* 0.44*  CALCIUM   --    < > 7.8*  --  8.3* 8.1* 8.0* 8.0* 7.9*  MG 1.9  --  1.9  --   --  1.6* 2.1 1.9  --    < > = values in this interval not displayed.   GFR: Estimated Creatinine Clearance: 99.7 mL/min (A) (by C-G formula based on SCr of 0.44 mg/dL (L)). Liver Function Tests: No results for input(s): AST, ALT, ALKPHOS, BILITOT, PROT, ALBUMIN  in the last 168 hours. No results for input(s): LIPASE, AMYLASE in the last 168 hours. No results for input(s): AMMONIA in the last 168 hours. Coagulation Profile: No results for input(s): INR, PROTIME in the last 168 hours. Cardiac Enzymes: No results for input(s): CKTOTAL, CKMB, CKMBINDEX, TROPONINI in the last 168 hours. BNP (last 3 results) No results for input(s): PROBNP in the last 8760 hours. HbA1C: No results for input(s):  HGBA1C in the last 72 hours. CBG: Recent Labs  Lab 09/10/23 2031 09/10/23 2357 09/11/23 0333 09/11/23 0752 09/12/23 2047  GLUCAP 142* 131* 124* 117* 126*   Lipid Profile: No results for input(s): CHOL, HDL, LDLCALC, TRIG, CHOLHDL, LDLDIRECT in the last 72 hours. Thyroid Function Tests: No results for input(s): TSH, T4TOTAL, FREET4, T3FREE, THYROIDAB in the last 72 hours. Anemia Panel: No results for input(s): VITAMINB12, FOLATE, FERRITIN, TIBC, IRON, RETICCTPCT in the last 72 hours. Sepsis Labs: Recent Labs  Lab 09/14/23 2055 09/14/23 2319 09/15/23 0511  PROCALCITON <0.10  --   --   LATICACIDVEN  --  0.9 0.8    Recent Results (from the past 240 hours)  Resp panel by RT-PCR (RSV, Flu A&B, Covid) Anterior Nasal Swab     Status: None   Collection Time: 09/14/23 10:53 PM   Specimen: Anterior Nasal Swab  Result Value Ref Range Status   SARS Coronavirus 2 by RT PCR NEGATIVE NEGATIVE Final    Comment: (NOTE) SARS-CoV-2 target nucleic acids are NOT DETECTED.  The SARS-CoV-2 RNA is generally detectable in upper respiratory specimens during the acute phase of infection. The lowest concentration of SARS-CoV-2 viral copies this assay can detect is 138 copies/mL. A negative result does not preclude SARS-Cov-2 infection and should not be used as the sole basis for treatment or other patient management decisions. A negative result may occur with  improper specimen collection/handling, submission of specimen other than nasopharyngeal swab, presence of viral mutation(s) within the areas targeted by this assay, and inadequate number of viral copies(<138 copies/mL). A negative result must be combined with clinical observations, patient history, and epidemiological information. The expected result is Negative.  Fact Sheet for Patients:  BloggerCourse.com  Fact Sheet for Healthcare Providers:   SeriousBroker.it  This test is no t yet approved or cleared by the [place]  FDA and  has been authorized for detection and/or diagnosis of SARS-CoV-2 by FDA under an Emergency Use Authorization (EUA). This EUA will remain  in effect (meaning this test can be used) for the duration of  the COVID-19 declaration under Section 564(b)(1) of the Act, 21 U.S.C.section 360bbb-3(b)(1), unless the authorization is terminated  or revoked sooner.       Influenza A by PCR NEGATIVE NEGATIVE Final   Influenza B by PCR NEGATIVE NEGATIVE Final    Comment: (NOTE) The Xpert Xpress SARS-CoV-2/FLU/RSV plus assay is intended as an aid in the diagnosis of influenza from Nasopharyngeal swab specimens and should not be used as a sole basis for treatment. Nasal washings and aspirates are unacceptable for Xpert Xpress SARS-CoV-2/FLU/RSV testing.  Fact Sheet for Patients: BloggerCourse.com  Fact Sheet for Healthcare Providers: SeriousBroker.it  This test is not yet approved or cleared by the [place]  FDA and has been authorized for detection and/or diagnosis of SARS-CoV-2 by FDA under an Emergency Use Authorization (EUA). This EUA will remain in effect (meaning this test can be used) for the duration of the COVID-19 declaration under Section 564(b)(1) of the Act, 21 U.S.C. section 360bbb-3(b)(1), unless the authorization is terminated or revoked.     Resp Syncytial Virus by PCR NEGATIVE NEGATIVE Final    Comment: (NOTE) Fact Sheet for Patients: BloggerCourse.com  Fact Sheet for Healthcare Providers: SeriousBroker.it  This test is not yet approved or cleared by the [place]  FDA and has been authorized for detection and/or diagnosis of SARS-CoV-2 by FDA under an Emergency Use Authorization (EUA). This EUA will remain in effect (meaning this test can be used) for  the duration of the COVID-19 declaration under Section 564(b)(1) of the Act, 21 U.S.C. section 360bbb-3(b)(1), unless the authorization is terminated or revoked.  Performed at [facility], [address] [place], [place] [address]   Blood culture (routine x 2)     Status: None (Preliminary result)   Collection Time: 09/14/23 10:53 PM   Specimen: BLOOD  Result Value Ref Range Status   Specimen Description BLOOD LEFT HAND  Final   Special Requests   Final    BOTTLES DRAWN AEROBIC AND ANAEROBIC Blood Culture results may not be optimal due to an inadequate volume of blood received in culture bottles   Culture   Final    NO GROWTH 3 DAYS Performed at [facility], [address], [place], [place] [address]    Report Status PENDING  Incomplete  Blood culture (routine x 2)     Status: None (Preliminary result)   Collection Time: 09/14/23 11:19 PM   Specimen: BLOOD  Result Value Ref Range Status   Specimen Description BLOOD BLOOD RIGHT ARM  Final   Special Requests   Final    BOTTLES DRAWN AEROBIC AND ANAEROBIC Blood Culture results may not be optimal due to an inadequate volume of blood received in culture bottles   Culture   Final    NO GROWTH 3 DAYS Performed at [facility], [address] [place], [place] [address]    Report Status PENDING  Incomplete  MRSA Next Gen by PCR, Nasal     Status: None   Collection Time: 09/15/23 10:22 AM   Specimen: Nasal Mucosa; Nasal Swab  Result Value Ref Range Status   MRSA by PCR Next Gen NOT DETECTED NOT DETECTED Final    Comment: (NOTE) The GeneXpert MRSA Assay (FDA approved for NASAL specimens only), is one component of a comprehensive MRSA colonization surveillance program. It is not intended to diagnose MRSA infection nor to guide or monitor treatment for MRSA infections. Test performance is not FDA approved in patients less than 66 years old. Performed at [facility], [address]  [place], [place] [address]   Expectorated Sputum Assessment w Gram Stain, Rflx to Resp Cult     Status: None   Collection Time: 09/15/23  6:40 PM   Specimen: Sputum  Result Value Ref Range Status   Specimen Description SPUTUM  Final   Special Requests EXPSU  Final   Sputum evaluation   Final    Sputum specimen not acceptable for testing.  Please recollect.   RESULT CALLED TO, READ BACK BY AND VERIFIED WITH: RYAN AINSWORTH @2027  ON 09/15/23 SKL Performed at [facility], [address] [place], [place] [address]    Report Status 09/15/2023 FINAL  Final         Radiology Studies: DG Chest 1 View Result Date: 09/17/2023 CLINICAL DATA:  Hypoxemia EXAM: CHEST  1 VIEW COMPARISON:  09/14/2023 FINDINGS: LEFT stable enlarged cardiac silhouette. Low lung volumes. Bilateral pleural effusions similar prior. Mild increase in central venous congestion/pulmonary edema. No pneumothorax. IMPRESSION: 1. Mild increase in central venous congestion/pulmonary edema. 2. Stable bilateral pleural effusions. 3. Low lung volumes. Electronically Signed   By: Jackquline Boxer M.D.   On: 09/17/2023 12:02         Scheduled Meds:  ascorbic acid   500 mg Oral BID   enoxaparin  (LOVENOX ) injection  40 mg Subcutaneous Q24H   feeding supplement  237 mL Oral BID BM   folic acid   1 mg Oral Daily   lidocaine   1 patch Transdermal Q24H   metoprolol  tartrate  25 mg Oral BID   multivitamin with minerals  1 tablet Oral Daily   nicotine   21 mg Transdermal Daily   nystatin   1 Application Topical BID   pantoprazole   40 mg Oral Daily   sodium chloride   1 g Oral TID WC   thiamine   100 mg Oral Daily   Continuous Infusions:  albumin  human     ceFEPime  (MAXIPIME ) IV 2 g (09/17/23 0604)   furosemide  (LASIX ) 200 mg in dextrose  5 % 100 mL (2 mg/mL) infusion       LOS: 3 days    Time spent: 55 minutes    Electa Sterry D Maree, DO [facility]  If 7PM-7AM, please contact night-coverage [email or web address] 09/17/2023,  1:02 PM

## 2023-09-18 DIAGNOSIS — J189 Pneumonia, unspecified organism: Secondary | ICD-10-CM | POA: Diagnosis not present

## 2023-09-18 LAB — BASIC METABOLIC PANEL WITH GFR
Anion gap: 10 (ref 5–15)
Anion gap: 12 (ref 5–15)
BUN: 9 mg/dL (ref 8–23)
BUN: 15 mg/dL (ref 8–23)
CO2: 27 mmol/L (ref 22–32)
CO2: 27 mmol/L (ref 22–32)
Calcium: 8.3 mg/dL — ABNORMAL LOW (ref 8.9–10.3)
Calcium: 8.4 mg/dL — ABNORMAL LOW (ref 8.9–10.3)
Chloride: 88 mmol/L — ABNORMAL LOW (ref 98–111)
Chloride: 89 mmol/L — ABNORMAL LOW (ref 98–111)
Creatinine, Ser: 0.42 mg/dL — ABNORMAL LOW (ref 0.61–1.24)
Creatinine, Ser: 0.57 mg/dL — ABNORMAL LOW (ref 0.61–1.24)
GFR, Estimated: >60 mL/min (ref >60)
GFR, Estimated: >60 mL/min (ref >60)
Glucose, Bld: 136 mg/dL — ABNORMAL HIGH (ref 70–99)
Glucose, Bld: 131 mg/dL — ABNORMAL HIGH (ref 70–99)
Potassium: 3.3 mmol/L — ABNORMAL LOW (ref 3.5–5.1)
Potassium: 4.1 mmol/L (ref 3.5–5.1)
Sodium: 125 mmol/L — ABNORMAL LOW (ref 135–145)
Sodium: 128 mmol/L — ABNORMAL LOW (ref 135–145)

## 2023-09-18 LAB — CBC
HCT: 28.8 % — ABNORMAL LOW (ref 39.0–52.0)
Hemoglobin: 10.2 g/dL — ABNORMAL LOW (ref 13.0–17.0)
MCH: 30.8 pg (ref 26.0–34.0)
MCHC: 35.4 g/dL (ref 30.0–36.0)
MCV: 87 fL (ref 80.0–100.0)
Platelets: 390 10*3/uL (ref 150–400)
RBC: 3.31 MIL/uL — ABNORMAL LOW (ref 4.22–5.81)
RDW: 14.3 % (ref 11.5–15.5)
WBC: 10.6 10*3/uL — ABNORMAL HIGH (ref 4.0–10.5)
nRBC: 0 % (ref 0.0–0.2)

## 2023-09-18 LAB — MAGNESIUM: Magnesium: 1.6 mg/dL — ABNORMAL LOW (ref 1.7–2.4)

## 2023-09-18 MED ORDER — POTASSIUM CHLORIDE CRYS ER 20 MEQ PO TBCR
40.0000 meq | EXTENDED_RELEASE_TABLET | Freq: Two times a day (BID) | ORAL | Status: AC
  Administered 2023-09-18 (×2): 40 meq via ORAL
  Filled 2023-09-18 (×2): qty 2

## 2023-09-18 MED ORDER — SENNOSIDES-DOCUSATE SODIUM 8.6-50 MG PO TABS: 1.0000 | ORAL_TABLET | Freq: Every evening | ORAL | Status: DC | PRN

## 2023-09-18 MED ORDER — POLYETHYLENE GLYCOL 3350 17 G PO PACK
17.0000 g | PACK | Freq: Every day | ORAL | Status: DC
  Administered 2023-09-18 – 2023-09-22 (×5): 17 g via ORAL
  Filled 2023-09-18 (×5): qty 1

## 2023-09-18 MED ORDER — MAGNESIUM SULFATE 2 GM/50ML IV SOLN
2.0000 g | Freq: Once | INTRAVENOUS | Status: AC
  Administered 2023-09-18: 2 g via INTRAVENOUS
  Filled 2023-09-18: qty 50

## 2023-09-18 MED ORDER — POTASSIUM CHLORIDE CRYS ER 20 MEQ PO TBCR
40.0000 meq | EXTENDED_RELEASE_TABLET | Freq: Every day | ORAL | Status: DC
  Administered 2023-09-19: 40 meq via ORAL
  Filled 2023-09-18: qty 2

## 2023-09-18 NOTE — Progress Notes (Addendum)
 PROGRESS NOTE    Barry Fowler  FMW:[id number] DOB: Aug 24, 1955 DOA: 09/14/2023 PCP: [facility]   Brief Narrative:    Barry Fowler is a 68 y.o. male with medical history significant of tobacco abuse, alcohol abuse, hypertension, chronic hyponatremia, who presents with SOB.   Patient was recently hospitalized from 6/9 - 6/17 due to severe hyponatremia with sodium 104 which improved to 124 at discharge; also had new onset A-fib, started on metoprolol  not anticoagulant since patient is a poor candidate for anticoagulant use.  Patient also had possible pneumonia which was treated with Rocephin  and doxycycline  in hospital.   Patient states that today he developed worsening shortness of breath and cough with little mucus production.  Patient normally not using oxygen, but was found to have moderate respiratory distress, oxygen desaturation to 80% on room air, initially started nonrebreather, then changed to 5 L of oxygen ED with saturation 95%.  Patient has been admitted with acute hypoxemic respiratory failure secondary to suspected HCAP and hyponatremia.  Chest x-ray shows ongoing pulmonary edema and case discussed with nephrology with plans for IV Lasix  drip and albumin  infusions due to soft blood pressure readings.  From rehab.   Assessment & Plan:   Principal Problem:   HCAP (healthcare-associated pneumonia) Active Problems:   Acute respiratory failure with hypoxia (HCC)   Paroxysmal atrial fibrillation (HCC)   HTN (hypertension)   Hyponatremia   Alcohol abuse   Tobacco abuse  Assessment and Plan:   Acute respiratory failure with hypoxia due to HCAP and volume overload with concern for acute HFpEF : - worsening shortness of breath with moderate acute respiratory distress and 5 days of new oxygen requirement.   -Chest x-ray showed increased interstitial markings in the lungs bilaterally, indicating HCAP. - elevated BNP 412, but no leg edema or JVD. Recent 2D  echo on 09/12/2023 showed EF of 55 to 60% with normal diastolic function - Remains on 5 L nasal cannula, wean as tolerated -Incentive spirometry - IV cefepime  to continue vancomycin  discontinued as MRSA PCR negative - Mucinex  for cough  - Bronchodilators - Urine legionella and S. pneumococcal antigen negative - Blood cultures with no growth noted - started on Lasix  infusion as well as albumin  doses given soft blood pressure readings and chest x-ray demonstrating pulmonary edema after discussion with nephrology by Dr. Maree-- UOP lessened on 6/22 after 24 hours so held lasix  gtt and checked BMP -If unable to wean oxygen would get CT scan of chest   Paroxysmal atrial fibrillation with RVR (HCC)-now in sinus rhythm -Continue metoprolol  - telemetry   HTN (hypertension) - Metoprolol  -Plan to hold ARB given softer blood pressure readings   Hyponatremia-hypervolemic, downtrending -Sodium level has dropped to 122  - Fluid restriction of 1200 mL and sodium chloride  tablets to continue -Lasix  drip with albumin  infusions ordered -trend BMP   Alcohol abuse and Tobacco abuse: Patient did not drink alcohol since discharged to SNF. -did counseling about importance of quitting alcohol and tobacco -Nicotine  patch -continue folic acid  and Vitamin b1    DVT prophylaxis:Lovenox  Code Status: Full Family Communication: None at bedside Disposition Plan:  Status is: Inpatient Remains inpatient appropriate because: Need for IV medications   Consultants:  Discussed case with Dr. Marcelino nephrology 6/21     Subjective: Remains on oxygen, asking when he will be discharged to skilled nursing facility  Objective: Vitals:   09/18/23 0452 09/18/23 0500 09/18/23 0741 09/18/23 0825  BP: 99/65  100/64   Pulse: (!) 109  (!) (  P) 103 87  Resp: 19  20   Temp: 98.8 F (37.1 C)  98.4 F (36.9 C)   TempSrc:   Oral   SpO2: 95%  94% 91%  Weight:  81.8 kg    Height:        Intake/Output Summary (Last  24 hours) at 09/18/2023 1016 Last data filed at 09/18/2023 0800 Gross per 24 hour  Intake 1028.87 ml  Output 3000 ml  Net -1971.13 ml   Filed Weights   09/16/23 0500 09/17/23 0500 09/18/23 0500  Weight: 82.2 kg 82 kg 81.8 kg    Examination:   General: Appearance:    Well developed, well nourished male in no acute distress     Lungs:     respirations unlabored, on 5 L nasal cannula, diminished  Heart:    Normal heart rate.    MS:   All extremities are intact.   Neurologic:   Awake, alert       Data Reviewed: I have personally reviewed following labs and imaging studies  CBC: Recent Labs  Lab 09/14/23 2055 09/15/23 0511 09/16/23 0517 09/17/23 0442 09/18/23 0540  WBC 12.1* 11.1* 15.9* 14.2* 10.6*  NEUTROABS 8.9*  --   --   --   --   HGB 10.9* 11.1* 10.4* 10.6* 10.2*  HCT 31.5* 32.1* 29.7* 30.8* 28.8*  MCV 89.2 88.4 87.6 89.0 87.0  PLT 392 343 359 371 390   Basic Metabolic Panel: Recent Labs  Lab 09/15/23 0511 09/16/23 0517 09/17/23 0442 09/17/23 0947 09/18/23 0540  NA 125* 126* 122* 122* 125*  K 3.8 4.1 4.0 4.0 3.3*  CL 91* 93* 91* 90* 88*  CO2 24 23 23 22 27   GLUCOSE 138* 125* 127* 153* 136*  BUN 7* 11 12 13 9   CREATININE 0.49* 0.51* 0.47* 0.44* 0.42*  CALCIUM  8.1* 8.0* 8.0* 7.9* 8.3*  MG 1.6* 2.1 1.9  --  1.6*   GFR: Estimated Creatinine Clearance: 99.7 mL/min (A) (by C-G formula based on SCr of 0.42 mg/dL (L)). Liver Function Tests: No results for input(s): AST, ALT, ALKPHOS, BILITOT, PROT, ALBUMIN  in the last 168 hours. No results for input(s): LIPASE, AMYLASE in the last 168 hours. No results for input(s): AMMONIA in the last 168 hours. Coagulation Profile: No results for input(s): INR, PROTIME in the last 168 hours. Cardiac Enzymes: No results for input(s): CKTOTAL, CKMB, CKMBINDEX, TROPONINI in the last 168 hours. BNP (last 3 results) No results for input(s): PROBNP in the last 8760 hours. HbA1C: No results for  input(s): HGBA1C in the last 72 hours. CBG: Recent Labs  Lab 09/12/23 2047  GLUCAP 126*   Lipid Profile: No results for input(s): CHOL, HDL, LDLCALC, TRIG, CHOLHDL, LDLDIRECT in the last 72 hours. Thyroid Function Tests: No results for input(s): TSH, T4TOTAL, FREET4, T3FREE, THYROIDAB in the last 72 hours. Anemia Panel: No results for input(s): VITAMINB12, FOLATE, FERRITIN, TIBC, IRON, RETICCTPCT in the last 72 hours. Sepsis Labs: Recent Labs  Lab 09/14/23 2055 09/14/23 2319 09/15/23 0511  PROCALCITON <0.10  --   --   LATICACIDVEN  --  0.9 0.8    Recent Results (from the past 240 hours)  Resp panel by RT-PCR (RSV, Flu A&B, Covid) Anterior Nasal Swab     Status: None   Collection Time: 09/14/23 10:53 PM   Specimen: Anterior Nasal Swab  Result Value Ref Range Status   SARS Coronavirus 2 by RT PCR NEGATIVE NEGATIVE Final    Comment: (NOTE) SARS-CoV-2 target nucleic acids are  NOT DETECTED.  The SARS-CoV-2 RNA is generally detectable in upper respiratory specimens during the acute phase of infection. The lowest concentration of SARS-CoV-2 viral copies this assay can detect is 138 copies/mL. A negative result does not preclude SARS-Cov-2 infection and should not be used as the sole basis for treatment or other patient management decisions. A negative result may occur with  improper specimen collection/handling, submission of specimen other than nasopharyngeal swab, presence of viral mutation(s) within the areas targeted by this assay, and inadequate number of viral copies(<138 copies/mL). A negative result must be combined with clinical observations, patient history, and epidemiological information. The expected result is Negative.  Fact Sheet for Patients:  BloggerCourse.com  Fact Sheet for Healthcare Providers:  SeriousBroker.it  This test is no t yet approved or cleared by the [place] FDA and  has been authorized for detection and/or diagnosis of SARS-CoV-2 by FDA under an Emergency Use Authorization (EUA). This EUA will remain  in effect (meaning this test can be used) for the duration of the COVID-19 declaration under Section 564(b)(1) of the Act, 21 U.S.C.section 360bbb-3(b)(1), unless the authorization is terminated  or revoked sooner.       Influenza A by PCR NEGATIVE NEGATIVE Final   Influenza B by PCR NEGATIVE NEGATIVE Final    Comment: (NOTE) The Xpert Xpress SARS-CoV-2/FLU/RSV plus assay is intended as an aid in the diagnosis of influenza from Nasopharyngeal swab specimens and should not be used as a sole basis for treatment. Nasal washings and aspirates are unacceptable for Xpert Xpress SARS-CoV-2/FLU/RSV testing.  Fact Sheet for Patients: BloggerCourse.com  Fact Sheet for Healthcare Providers: SeriousBroker.it  This test is not yet approved or cleared by the [place]  FDA and has been authorized for detection and/or diagnosis of SARS-CoV-2 by FDA under an Emergency Use Authorization (EUA). This EUA will remain in effect (meaning this test can be used) for the duration of the COVID-19 declaration under Section 564(b)(1) of the Act, 21 U.S.C. section 360bbb-3(b)(1), unless the authorization is terminated or revoked.     Resp Syncytial Virus by PCR NEGATIVE NEGATIVE Final    Comment: (NOTE) Fact Sheet for Patients: BloggerCourse.com  Fact Sheet for Healthcare Providers: SeriousBroker.it  This test is not yet approved or cleared by the [place]  FDA and has been authorized for detection and/or diagnosis of SARS-CoV-2 by FDA under an Emergency Use Authorization (EUA). This EUA will remain in effect (meaning this test can be used) for the duration of the COVID-19 declaration under Section 564(b)(1) of the Act, 21 U.S.C. section  360bbb-3(b)(1), unless the authorization is terminated or revoked.  Performed at [facility], [address] [place], [place] [address]   Blood culture (routine x 2)     Status: None (Preliminary result)   Collection Time: 09/14/23 10:53 PM   Specimen: BLOOD  Result Value Ref Range Status   Specimen Description BLOOD LEFT HAND  Final   Special Requests   Final    BOTTLES DRAWN AEROBIC AND ANAEROBIC Blood Culture results may not be optimal due to an inadequate volume of blood received in culture bottles   Culture   Final    NO GROWTH 4 DAYS Performed at [facility], [address] [place], [place] [address]    Report Status PENDING  Incomplete  Blood culture (routine x 2)     Status: None (Preliminary result)   Collection Time: 09/14/23 11:19 PM   Specimen: BLOOD  Result Value Ref Range Status  Specimen Description BLOOD BLOOD RIGHT ARM  Final   Special Requests   Final    BOTTLES DRAWN AEROBIC AND ANAEROBIC Blood Culture results may not be optimal due to an inadequate volume of blood received in culture bottles   Culture   Final    NO GROWTH 4 DAYS Performed at [facility], [address] [place], [place] [address]    Report Status PENDING  Incomplete  MRSA Next Gen by PCR, Nasal     Status: None   Collection Time: 09/15/23 10:22 AM   Specimen: Nasal Mucosa; Nasal Swab  Result Value Ref Range Status   MRSA by PCR Next Gen NOT DETECTED NOT DETECTED Final    Comment: (NOTE) The GeneXpert MRSA Assay (FDA approved for NASAL specimens only), is one component of a comprehensive MRSA colonization surveillance program. It is not intended to diagnose MRSA infection nor to guide or monitor treatment for MRSA infections. Test performance is not FDA approved in patients less than 66 years old. Performed at [facility], [address] [place], [place] [address]   Expectorated Sputum Assessment w Gram Stain, Rflx to Resp Cult     Status:  None   Collection Time: 09/15/23  6:40 PM   Specimen: Sputum  Result Value Ref Range Status   Specimen Description SPUTUM  Final   Special Requests EXPSU  Final   Sputum evaluation   Final    Sputum specimen not acceptable for testing.  Please recollect.   RESULT CALLED TO, READ BACK BY AND VERIFIED WITH: RYAN AINSWORTH @2027  ON 09/15/23 SKL Performed at [facility], [address] [place], [place] [address]    Report Status 09/15/2023 FINAL  Final         Radiology Studies: DG Chest 1 View Result Date: 09/17/2023 CLINICAL DATA:  Hypoxemia EXAM: CHEST  1 VIEW COMPARISON:  09/14/2023 FINDINGS: LEFT stable enlarged cardiac silhouette. Low lung volumes. Bilateral pleural effusions similar prior. Mild increase in central venous congestion/pulmonary edema. No pneumothorax. IMPRESSION: 1. Mild increase in central venous congestion/pulmonary edema. 2. Stable bilateral pleural effusions. 3. Low lung volumes. Electronically Signed   By: Jackquline Boxer M.D.   On: 09/17/2023 12:02         Scheduled Meds:  ascorbic acid   500 mg Oral BID   enoxaparin  (LOVENOX ) injection  40 mg Subcutaneous Q24H   feeding supplement  237 mL Oral BID BM   folic acid   1 mg Oral Daily   lidocaine   1 patch Transdermal Q24H   metoprolol  tartrate  25 mg Oral BID   multivitamin with minerals  1 tablet Oral Daily   nicotine   21 mg Transdermal Daily   nystatin   1 Application Topical BID   pantoprazole   40 mg Oral Daily   sodium chloride   1 g Oral TID WC   thiamine   100 mg Oral Daily   Continuous Infusions:  albumin  human Stopped (09/18/23 0019)   ceFEPime  (MAXIPIME ) IV 2 g (09/18/23 0249)   furosemide  (LASIX ) 200 mg in dextrose  5 % 100 mL (2 mg/mL) infusion 4 mg/hr (09/17/23 1400)     LOS: 4 days    Time spent: 55 minutes    Harlene RAYMOND Bowl, DO [facility]  If 7PM-7AM, please contact night-coverage [email or web address] 09/18/2023, 10:16 AM

## 2023-09-18 NOTE — Plan of Care (Signed)
   Problem: Education: Goal: Knowledge of General Education information will improve Description: Including pain rating scale, medication(s)/side effects and non-pharmacologic comfort measures Outcome: Progressing   Problem: Nutrition: Goal: Adequate nutrition will be maintained Outcome: Progressing   Problem: Elimination: Goal: Will not experience complications related to bowel motility Outcome: Progressing

## 2023-09-19 ENCOUNTER — Inpatient Hospital Stay

## 2023-09-19 DIAGNOSIS — J90 Pleural effusion, not elsewhere classified: Secondary | ICD-10-CM | POA: Insufficient documentation

## 2023-09-19 DIAGNOSIS — J189 Pneumonia, unspecified organism: Secondary | ICD-10-CM | POA: Diagnosis not present

## 2023-09-19 DIAGNOSIS — I3139 Other pericardial effusion (noninflammatory): Secondary | ICD-10-CM | POA: Diagnosis not present

## 2023-09-19 DIAGNOSIS — J9601 Acute respiratory failure with hypoxia: Secondary | ICD-10-CM | POA: Diagnosis not present

## 2023-09-19 LAB — PROTEIN, PLEURAL OR PERITONEAL FLUID: Total protein, fluid: 3.4 g/dL

## 2023-09-19 LAB — PROCALCITONIN: Procalcitonin: <0.1 ng/mL

## 2023-09-19 LAB — CBC
HCT: 28 % — ABNORMAL LOW (ref 39.0–52.0)
Hemoglobin: 9.5 g/dL — ABNORMAL LOW (ref 13.0–17.0)
MCH: 30.4 pg (ref 26.0–34.0)
MCHC: 33.9 g/dL (ref 30.0–36.0)
MCV: 89.5 fL (ref 80.0–100.0)
Platelets: 380 10*3/uL (ref 150–400)
RBC: 3.13 MIL/uL — ABNORMAL LOW (ref 4.22–5.81)
RDW: 14.4 % (ref 11.5–15.5)
WBC: 9 10*3/uL (ref 4.0–10.5)
nRBC: 0 % (ref 0.0–0.2)

## 2023-09-19 LAB — GLUCOSE, PLEURAL OR PERITONEAL FLUID: Glucose, Fluid: 151 mg/dL

## 2023-09-19 LAB — BASIC METABOLIC PANEL WITH GFR
Anion gap: 10 (ref 5–15)
BUN: 15 mg/dL (ref 8–23)
CO2: 27 mmol/L (ref 22–32)
Calcium: 8.8 mg/dL — ABNORMAL LOW (ref 8.9–10.3)
Chloride: 91 mmol/L — ABNORMAL LOW (ref 98–111)
Creatinine, Ser: 0.51 mg/dL — ABNORMAL LOW (ref 0.61–1.24)
GFR, Estimated: >60 mL/min (ref >60)
Glucose, Bld: 130 mg/dL — ABNORMAL HIGH (ref 70–99)
Potassium: 4.4 mmol/L (ref 3.5–5.1)
Sodium: 128 mmol/L — ABNORMAL LOW (ref 135–145)

## 2023-09-19 LAB — CULTURE, BLOOD (ROUTINE X 2)
Culture: NO GROWTH
Culture: NO GROWTH

## 2023-09-19 LAB — BRAIN NATRIURETIC PEPTIDE: B Natriuretic Peptide: 202.9 pg/mL — ABNORMAL HIGH (ref 0.0–100.0)

## 2023-09-19 LAB — OSMOLALITY: Osmolality: 268 mosm/kg — ABNORMAL LOW (ref 275–295)

## 2023-09-19 LAB — OSMOLALITY, URINE: Osmolality, Ur: 805 mosm/kg (ref 300–900)

## 2023-09-19 LAB — BODY FLUID CELL COUNT WITH DIFFERENTIAL
Eos, Fluid: 0 %
Lymphs, Fluid: 18 %
Monocyte-Macrophage-Serous Fluid: 32 % — ABNORMAL LOW (ref 50–90)
Neutrophil Count, Fluid: 48 % — ABNORMAL HIGH (ref 0–25)
Other Cells, Fluid: 2 %
Total Nucleated Cell Count, Fluid: 93 uL (ref 0–1000)

## 2023-09-19 LAB — LACTATE DEHYDROGENASE, PLEURAL OR PERITONEAL FLUID: LD, Fluid: 116 U/L — ABNORMAL HIGH (ref 3–23)

## 2023-09-19 LAB — SODIUM, URINE, RANDOM: Sodium, Ur: 166 mmol/L

## 2023-09-19 MED ORDER — LIDOCAINE HCL (PF) 1 % IJ SOLN
10.0000 mL | Freq: Once | INTRAMUSCULAR | Status: AC
  Administered 2023-09-19: 10 mL via INTRADERMAL
  Filled 2023-09-19: qty 10

## 2023-09-19 MED ORDER — IOHEXOL 300 MG/ML  SOLN
75.0000 mL | Freq: Once | INTRAMUSCULAR | Status: AC | PRN
  Administered 2023-09-19: 75 mL via INTRAVENOUS

## 2023-09-19 NOTE — Assessment & Plan Note (Signed)
 CT chest today with bilateral pleural effusions left greater than right. -Ultrasound-guided thoracentesis ordered with labs and cytology

## 2023-09-19 NOTE — Assessment & Plan Note (Signed)
 Seems like having chronic hyponatremia.  No hyponatremia labs in the system.  Has an history of alcohol abuse.  Sodium at 128 today and patient is on salt tablets.  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

## 2023-09-19 NOTE — Progress Notes (Signed)
 Occupational Therapy Treatment Patient Details Name: Barry Fowler MRN: [id number] DOB: 1955/03/30 Today's Date: 09/19/2023   History of present illness Pt is a 68 year old male presents with SOB, admitted with acute hypoxemic respiratory failure secondary to suspected HCAP    PMH significant for tobacco abuse, alcohol abuse, hypertension, chronic hyponatremia   OT comments  Mr Strine seen for OT treatment on this date. Upon arrival to room pt in bed, agreeable to tx. Pt requires SUPERVISION for bed mobility and MOD A + RW +2 for safety for STS at EOB and to ambulate around to opposite side of bed. Transport arrived to take pt to a procedure, session ended. Pt making good progress toward goals, will continue to follow POC. Discharge recommendation remains appropriate.        If plan is discharge home, recommend the following:  Two people to help with walking and/or transfers;Two people to help with bathing/dressing/bathroom;Help with stairs or ramp for entrance   Equipment Recommendations  Other (comment)    Recommendations for Other Services      Precautions / Restrictions Precautions Precautions: Fall Recall of Precautions/Restrictions: Impaired Restrictions Weight Bearing Restrictions Per Provider Order: No       Mobility Bed Mobility Overal bed mobility: Needs Assistance Bed Mobility: Supine to Sit, Sit to Supine     Supine to sit: Supervision Sit to supine: Supervision        Transfers Overall transfer level: Needs assistance Equipment used: Rolling walker (2 wheels) Transfers: Sit to/from Stand Sit to Stand: Mod assist, +2 physical assistance                 Balance Overall balance assessment: Needs assistance Sitting-balance support: No upper extremity supported, Feet supported Sitting balance-Leahy Scale: Fair     Standing balance support: Bilateral upper extremity supported, Reliant on assistive device for balance Standing balance-Leahy Scale: Fair                              ADL either performed or assessed with clinical judgement   ADL                                              Extremity/Trunk Assessment Upper Extremity Assessment Upper Extremity Assessment: Overall WFL for tasks assessed   Lower Extremity Assessment Lower Extremity Assessment: Generalized weakness        Vision       Perception     Praxis     Communication Communication Communication: No apparent difficulties   Cognition Arousal: Alert Behavior During Therapy: WFL for tasks assessed/performed Cognition: No apparent impairments                               Following commands: Intact        Cueing   Cueing Techniques: Verbal cues  Exercises      Shoulder Instructions       General Comments      Pertinent Vitals/ Pain       Pain Assessment Pain Assessment: Faces Pain Score: 0-No pain  Home Living  Prior Functioning/Environment              Frequency  Min 2X/week        Progress Toward Goals  OT Goals(current goals can now be found in the care plan section)  Progress towards OT goals: Progressing toward goals  Acute Rehab OT Goals Patient Stated Goal: to go home OT Goal Formulation: With patient Time For Goal Achievement: 10/03/23 Potential to Achieve Goals: Good ADL Goals Pt Will Perform Grooming: with min assist;sitting;standing Pt Will Perform Lower Body Dressing: with min assist;sit to/from stand;sitting/lateral leans Pt Will Transfer to Toilet: with min assist;ambulating Pt Will Perform Toileting - Clothing Manipulation and hygiene: with min assist;sitting/lateral leans;sit to/from stand  Plan      Co-evaluation                 AM-PAC OT 6 Clicks Daily Activity     Outcome Measure   Help from another person eating meals?: None Help from another person taking care of personal grooming?: A  Little Help from another person toileting, which includes using toliet, bedpan, or urinal?: A Lot Help from another person bathing (including washing, rinsing, drying)?: A Lot Help from another person to put on and taking off regular upper body clothing?: A Little Help from another person to put on and taking off regular lower body clothing?: A Little 6 Click Score: 17    End of Session Equipment Utilized During Treatment: Gait belt;Rolling walker (2 wheels)  OT Visit Diagnosis: Other abnormalities of gait and mobility (R26.89);Muscle weakness (generalized) (M62.81)   Activity Tolerance Patient tolerated treatment well   Patient Left in bed;with call bell/phone within reach;Other (comment) (w/ transport in room)   Nurse Communication          Time: [phone] OT Time Calculation (min): 12 min  Charges: OT General Charges $OT Visit: 1 Visit OT Treatments $Therapeutic Activity: 8-22 mins  [place], Student OT   [employer] 09/19/2023, 4:08 PM

## 2023-09-19 NOTE — Assessment & Plan Note (Signed)
 Initial concern of HCAP as he was admitted recently with concern of pneumonia and completed a course of antibiotic. MRSA PCR negative.  Initially started on cefepime  and vancomycin  and continued on cefepime  only.  Blood cultures negative.  Urine strep pneumo and Legionella antigen negative. CT chest with contrast today with bilateral pleural effusion and adjacent lung opacities which need further investigation. - Repeat procalcitonin -Continue with cefepime  for now-we will stop antibiotics if repeat procalcitonin is negative. - Continue with supportive care

## 2023-09-19 NOTE — Assessment & Plan Note (Signed)
 Currently in sinus rhythm.  Paroxysmal A-fib with RVR. Not on any anticoagulation at this time. - Continue with metoprolol 

## 2023-09-19 NOTE — Assessment & Plan Note (Signed)
Counseling was provided. -Nicotine patch as needed 

## 2023-09-19 NOTE — Assessment & Plan Note (Signed)
 Patient with no baseline oxygen use.  Initially required nonrebreather, currently on 4 L of oxygen.  Initial concern of HCAP but procalcitonin was negative.  Chest x-ray with concern of increased interstitial markings. CT chest today with bilateral pleural effusions and large pericardial effusion. - Continue with supplemental oxygen -Wean as tolerated

## 2023-09-19 NOTE — TOC Progression Note (Signed)
 Transition of Care [facility]) - Progression Note    Patient Details  Name: Barry Fowler MRN: [id number] Date of Birth: 07/13/1955  [facility] CM/SW Contact  Quintella Suzen Jansky, RN Phone Number: 09/19/2023, 3:28 PM  Clinical Narrative:     Remains on oxygen, weaning as tolerated. Nephrology consulted. Patient will need new auth to return to [facility]. TOC will continue to follow.  Expected Discharge Plan: Skilled Nursing Facility Barriers to Discharge: Continued Medical Work up  Expected Discharge Plan and Services       Living arrangements for the past 2 months: Apartment                   DME Agency: NA       HH Arranged: NA           Social Determinants of Health (SDOH) Interventions SDOH Screenings   Food Insecurity: No Food Insecurity (09/16/2023)  Housing: Low Risk  (09/16/2023)  Transportation Needs: No Transportation Needs (09/16/2023)  Utilities: Not At Risk (09/16/2023)  Social Connections: Unknown (09/16/2023)  Tobacco Use: High Risk (09/15/2023)    Readmission Risk Interventions     No data to display

## 2023-09-19 NOTE — Hospital Course (Addendum)
 Taken from prior notes.  Barry Fowler is a 68 y.o. male with medical history significant of tobacco abuse, alcohol abuse, hypertension, chronic hyponatremia, who presents with SOB.   Patient was recently hospitalized from 6/9 - 6/17 due to severe hyponatremia with sodium 104 which improved to 124 at discharge; also had new onset A-fib, started on metoprolol  not anticoagulant since patient is a poor candidate for anticoagulant use.  Patient also had possible pneumonia which was treated with Rocephin  and doxycycline  in hospital.   Patient was readmitted 2 days later with worsening dyspnea and continue to have cough with little mucus production.  He was found to be hypoxic up to 80% on room air, initially started on nonrebreather and later switched to 5 L of oxygen.  No baseline oxygen use.  Labs with concern of hyponatremia.  Chest x-ray with concern of pulmonary edema and bilateral lower lobe infiltrates.  BNP was elevated at 412.  Patient was started on broad-spectrum antibiotics for concern of HCAP.  Also received Lasix  infusion for a day with IV albumin  due to softer blood pressure to help with diuresis, due to decrease in UOP on 6/22 Lasix  infusion was held.  Clinically appears euvolemic.  BNP on repeat was 202.  Patient had an echo done on 09/12/2023 which shows normal EF and moderate pericardial effusion, no sign of cardiac tamponade.  IVC was dilated with less than 50% respiratory variability and a mild pulmonary hypertension.  6/23: Vital stable with borderline soft blood pressure and patient continued to require 3 to 4 L of oxygen, continue to have cough and worsening dyspnea.  Sodium at 128.  Leukocytosis has been resolved.  BNP 202 today.  CT chest with contrast was obtained which shows a large pericardial effusion with some pericardial thickening.  Moderate pleural effusions with adjacent lung opacities, left greater than right.  Also has few enlarged and prominent mediastinal nodes which could  be reactive but need additional workup like PET scan to rule out any underlying malignancy.  Slight nodular contour of the liver.  Thoracentesis with lab was also ordered.  Cardiology and pulmonary was consulted.  Procalcitonin on the day of this readmission was negative.  Repeating procalcitonin and if remain negative we will discontinue antibiotics.  6/24: Vital stable, procalcitonin remained negative so discontinuing antibiotics, hypoosmolar hyponatremia.  Urine osmolality and sodium elevated as compared to the prior check 2 weeks ago.-Patient is on salt tablets.  S/p thoracentesis with removal of 900 mL of amber-colored pleural fluid.  Labs with  elevated LDH at 116, and protein of 3.5, makes a transudative fluid.  Cultures pending Repeat limited echocardiogram pending.  Starting on Lasix  40 mg daily. Pulmonary is recommending monitoring bilateral pleural effusions likely will need more drainage from left.  Patient will need outpatient PET scan and EBUS if needed to rule out any underlying malignancy.  6/25: Vital stable, labs with slight worsening of hyponatremia with sodium at 127, elevated inflammatory markers with CRP 9.6 and ESR 70-starting on colchicine  as patient has both pleural and pericardial effusions.  Repeat limited echocardiogram with moderate pericardial effusion mostly posterolateral to left ventricle, no sign of tamponade.  Not amenable to pericardiocentesis at this time per cardiology. Continuing diuresis.  Patient underwent left thoracentesis today  6/26: Hemodynamically stable and clinically improving.  Pulmonary ordered another thoracentesis on right today but there was not enough fluid to drain so it got canceled.  Stable sodium at 127.  Blood pressure borderline soft so holding today's Lasix .  Repeat  CT chest with moderate pericardial effusion, persistent right pleural effusion and improvement of left pleural effusion.  Stable mediastinal and hilar lymphadenopathy.  Pulmonary  will arrange outpatient PET scan for further evaluation after discharge.  6/27: Hemodynamically stable, sodium at 129 today.  Cardiology has already signed off and patient will continue on p.o. Lasix  on discharge.  Pulmonary will arrange outpatient follow-up and likely a PET scan for further evaluation. Patient continued to require 2 L of oxygen-SNF can monitor and wean as tolerated.  Patient will continue on twice daily dose of colchicine  for next couple of weeks and then can take once a day if needed-outpatient cardiology can monitor and advise.  Patient will continue on current medications and need to have a close follow-up with his providers for further assistance.

## 2023-09-19 NOTE — Plan of Care (Signed)
   Problem: Coping: Goal: Level of anxiety will decrease Outcome: Progressing   Problem: Elimination: Goal: Will not experience complications related to bowel motility Outcome: Progressing

## 2023-09-19 NOTE — Procedures (Incomplete)
 PROCEDURE SUMMARY:  Successful US  guided right thoracentesis. Yielded of amber fluid. Patient tolerated procedure well. No immediate complications. EBL = trace  Specimen sent for labs.  Post procedure chest X-ray reveals *** pneumothorax  Nashton Belson CHRISTELLA Bal PA-C 09/19/2023 4:57 PM

## 2023-09-19 NOTE — Assessment & Plan Note (Signed)
 Patient did not drink alcohol since discharge to SNF after prior hospitalization. -Continue thiamine  and folic acid 

## 2023-09-19 NOTE — Plan of Care (Signed)

## 2023-09-19 NOTE — Assessment & Plan Note (Signed)
 Blood pressure borderline soft. - Continuing metoprolol  -Holding home antihypertensives

## 2023-09-19 NOTE — Assessment & Plan Note (Signed)
 Echocardiogram done on 6/16 with moderate circumferential pericardial effusion.  CT chest today with concern of large pericardial effusions. Patient did receive Lasix  infusion initially which was later discontinued due to decrease in UOP.  Only 200 mL of urine recorded over the past 24 hours.  Renal function seems stable. - Cardiology consult

## 2023-09-19 NOTE — Progress Notes (Signed)
 Progress Note   Patient: Barry Fowler FMW:[id number] DOB: 11/18/1955 DOA: 09/14/2023     5 DOS: the patient was seen and examined on 09/19/2023   Brief hospital course: Taken from prior notes.  Barry Fowler is a 68 y.o. male with medical history significant of tobacco abuse, alcohol abuse, hypertension, chronic hyponatremia, who presents with SOB.   Patient was recently hospitalized from 6/9 - 6/17 due to severe hyponatremia with sodium 104 which improved to 124 at discharge; also had new onset A-fib, started on metoprolol  not anticoagulant since patient is a poor candidate for anticoagulant use.  Patient also had possible pneumonia which was treated with Rocephin  and doxycycline  in hospital.   Patient was readmitted 2 days later with worsening dyspnea and continue to have cough with little mucus production.  He was found to be hypoxic up to 80% on room air, initially started on nonrebreather and later switched to 5 L of oxygen.  No baseline oxygen use.  Labs with concern of hyponatremia.  Chest x-ray with concern of pulmonary edema and bilateral lower lobe infiltrates.  BNP was elevated at 412.  Patient was started on broad-spectrum antibiotics for concern of HCAP.  Also received Lasix  infusion for a day with IV albumin  due to softer blood pressure to help with diuresis, due to decrease in UOP on 6/22 Lasix  infusion was held.  Clinically appears euvolemic.  BNP on repeat was 202.  Patient had an echo done on 09/12/2023 which shows normal EF and moderate pericardial effusion, no sign of cardiac tamponade.  IVC was dilated with less than 50% respiratory variability and a mild pulmonary hypertension.  6/23: Vital stable with borderline soft blood pressure and patient continued to require 3 to 4 L of oxygen, continue to have cough and worsening dyspnea.  Sodium at 128.  Leukocytosis has been resolved.  BNP 202 today.  CT chest with contrast was obtained which shows a large pericardial effusion with some  pericardial thickening.  Moderate pleural effusions with adjacent lung opacities, left greater than right.  Also has few enlarged and prominent mediastinal nodes which could be reactive but need additional workup like PET scan to rule out any underlying malignancy.  Slight nodular contour of the liver.  Thoracentesis with lab was also ordered.  Cardiology and pulmonary was consulted.  Procalcitonin on the day of this readmission was negative.  Repeating procalcitonin and if remain negative we will discontinue antibiotics.        Assessment and Plan: * Acute respiratory failure with hypoxia (HCC) Patient with no baseline oxygen use.  Initially required nonrebreather, currently on 4 L of oxygen.  Initial concern of HCAP but procalcitonin was negative.  Chest x-ray with concern of increased interstitial markings. CT chest today with bilateral pleural effusions and large pericardial effusion. - Continue with supplemental oxygen -Wean as tolerated  HCAP (healthcare-associated pneumonia) Initial concern of HCAP as he was admitted recently with concern of pneumonia and completed a course of antibiotic. MRSA PCR negative.  Initially started on cefepime  and vancomycin  and continued on cefepime  only.  Blood cultures negative.  Urine strep pneumo and Legionella antigen negative. CT chest with contrast today with bilateral pleural effusion and adjacent lung opacities which need further investigation. - Repeat procalcitonin -Continue with cefepime  for now-we will stop antibiotics if repeat procalcitonin is negative. - Continue with supportive care  Pericardial effusion Echocardiogram done on 6/16 with moderate circumferential pericardial effusion.  CT chest today with concern of large pericardial effusions. Patient did receive Lasix   infusion initially which was later discontinued due to decrease in UOP.  Only 200 mL of urine recorded over the past 24 hours.  Renal function seems stable. - Cardiology  consult  Bilateral pleural effusion CT chest today with bilateral pleural effusions left greater than right. -Ultrasound-guided thoracentesis ordered with labs and cytology  Hyponatremia Seems like having chronic hyponatremia.  No hyponatremia labs in the system.  Has an history of alcohol abuse.  Sodium at 128 today and patient is on salt tablets.  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  Paroxysmal A-fib with RVR. Not on any anticoagulation at this time. - Continue with metoprolol   HTN (hypertension) Blood pressure borderline soft. - Continuing metoprolol  -Holding home antihypertensives  Alcohol abuse Patient did not drink alcohol since discharge to SNF after prior hospitalization. -Continue thiamine  and folic acid   Tobacco abuse Counseling was provided -Nicotine  patch as needed      Subjective: Patient continued to have significant cough and shortness of breath.  Physical Exam: Vitals:   09/19/23 0355 09/19/23 0550 09/19/23 0730 09/19/23 1509  BP: 111/61  109/72 109/65  Pulse: 80  76 85  Resp: 18  18 17   Temp: (!) 97.5 F (36.4 C)  99.1 F (37.3 C) 98 F (36.7 C)  TempSrc:   Oral Oral  SpO2: 97%  95% 93%  Weight:  82 kg    Height:       General. Frail gentleman, In no acute distress. Pulmonary.  Lungs clear bilaterally,decreased BS at bases, mildly increased work of breathing. CV.  Regular rate and rhythm, no JVD, rub or murmur. Abdomen.  Soft, nontender, nondistended, BS positive. CNS.  Alert and oriented .  No focal neurologic deficit. Extremities.  No edema, no cyanosis, pulses intact and symmetrical.   Data Reviewed: Prior data reviewed.  Family Communication: Talked with sister on phone.  Disposition: Status is: Inpatient Remains inpatient appropriate because: Severity of illness.  Planned Discharge  Destination: Skilled nursing facility  DVT prophylaxis.  Lovenox  Time spent: 50 minutes  This record has been created using Conservation officer, historic buildings. Errors have been sought and corrected,but may not always be located. Such creation errors do not reflect on the standard of care.   Author: Amaryllis Dare, MD 09/19/2023 3:26 PM  For on call review [email or web address].

## 2023-09-20 ENCOUNTER — Inpatient Hospital Stay
Admit: 2023-09-20 | Discharge: 2023-09-20 | Disposition: A | Discharge status: Home / Self Care | Attending: Cardiovascular Disease | Admitting: Cardiovascular Disease

## 2023-09-20 ENCOUNTER — Encounter: Payer: Self-pay | Admitting: Internal Medicine

## 2023-09-20 DIAGNOSIS — J9601 Acute respiratory failure with hypoxia: Secondary | ICD-10-CM | POA: Diagnosis not present

## 2023-09-20 DIAGNOSIS — I3139 Other pericardial effusion (noninflammatory): Secondary | ICD-10-CM | POA: Diagnosis not present

## 2023-09-20 DIAGNOSIS — I5031 Acute diastolic (congestive) heart failure: Secondary | ICD-10-CM

## 2023-09-20 DIAGNOSIS — J90 Pleural effusion, not elsewhere classified: Secondary | ICD-10-CM | POA: Diagnosis not present

## 2023-09-20 DIAGNOSIS — F1090 Alcohol use, unspecified, uncomplicated: Secondary | ICD-10-CM

## 2023-09-20 DIAGNOSIS — J189 Pneumonia, unspecified organism: Secondary | ICD-10-CM | POA: Diagnosis not present

## 2023-09-20 DIAGNOSIS — R590 Localized enlarged lymph nodes: Secondary | ICD-10-CM

## 2023-09-20 LAB — ECHOCARDIOGRAM LIMITED
Height: 72 in
S' Lateral: 2.7 cm
Weight: 2754.87 [oz_av]

## 2023-09-20 LAB — LACTATE DEHYDROGENASE: LDH: 218 U/L — ABNORMAL HIGH (ref 98–192)

## 2023-09-20 LAB — C-REACTIVE PROTEIN: CRP: 9.6 mg/dL — ABNORMAL HIGH (ref <1.0)

## 2023-09-20 LAB — SEDIMENTATION RATE: Sed Rate: 70 mm/h — ABNORMAL HIGH (ref 0–20)

## 2023-09-20 MED ORDER — FUROSEMIDE 10 MG/ML IJ SOLN
40.0000 mg | Freq: Every day | INTRAMUSCULAR | Status: DC
  Administered 2023-09-20: 40 mg via INTRAVENOUS
  Filled 2023-09-20: qty 4

## 2023-09-20 MED ORDER — FUROSEMIDE 10 MG/ML IJ SOLN
40.0000 mg | Freq: Two times a day (BID) | INTRAMUSCULAR | Status: DC
  Administered 2023-09-20 – 2023-09-21 (×3): 40 mg via INTRAVENOUS
  Filled 2023-09-20 (×3): qty 4

## 2023-09-20 NOTE — Assessment & Plan Note (Signed)
 Echocardiogram done on 6/16 with moderate circumferential pericardial effusion.  CT chest today with concern of large pericardial effusions. Patient did receive Lasix  infusion initially which was later discontinued due to decrease in UOP.  Only 200 mL of urine recorded over the past 24 hours.  Renal function seems stable. -Starting on 40 mg IV Lasix  daily -Repeat limited echocardiogram ordered by cardiology-pending - Follow-up cardiology recommendations

## 2023-09-20 NOTE — Progress Notes (Signed)
 Progress Note   Patient: Barry Fowler FMW:[id number] DOB: 05/07/1955 DOA: 09/14/2023     6 DOS: the patient was seen and examined on 09/20/2023   Brief hospital course: Taken from prior notes.  Caley Volkert is a 68 y.o. male with medical history significant of tobacco abuse, alcohol abuse, hypertension, chronic hyponatremia, who presents with SOB.   Patient was recently hospitalized from 6/9 - 6/17 due to severe hyponatremia with sodium 104 which improved to 124 at discharge; also had new onset A-fib, started on metoprolol  not anticoagulant since patient is a poor candidate for anticoagulant use.  Patient also had possible pneumonia which was treated with Rocephin  and doxycycline  in hospital.   Patient was readmitted 2 days later with worsening dyspnea and continue to have cough with little mucus production.  He was found to be hypoxic up to 80% on room air, initially started on nonrebreather and later switched to 5 L of oxygen.  No baseline oxygen use.  Labs with concern of hyponatremia.  Chest x-ray with concern of pulmonary edema and bilateral lower lobe infiltrates.  BNP was elevated at 412.  Patient was started on broad-spectrum antibiotics for concern of HCAP.  Also received Lasix  infusion for a day with IV albumin  due to softer blood pressure to help with diuresis, due to decrease in UOP on 6/22 Lasix  infusion was held.  Clinically appears euvolemic.  BNP on repeat was 202.  Patient had an echo done on 09/12/2023 which shows normal EF and moderate pericardial effusion, no sign of cardiac tamponade.  IVC was dilated with less than 50% respiratory variability and a mild pulmonary hypertension.  6/23: Vital stable with borderline soft blood pressure and patient continued to require 3 to 4 L of oxygen, continue to have cough and worsening dyspnea.  Sodium at 128.  Leukocytosis has been resolved.  BNP 202 today.  CT chest with contrast was obtained which shows a large pericardial effusion with some  pericardial thickening.  Moderate pleural effusions with adjacent lung opacities, left greater than right.  Also has few enlarged and prominent mediastinal nodes which could be reactive but need additional workup like PET scan to rule out any underlying malignancy.  Slight nodular contour of the liver.  Thoracentesis with lab was also ordered.  Cardiology and pulmonary was consulted.  Procalcitonin on the day of this readmission was negative.  Repeating procalcitonin and if remain negative we will discontinue antibiotics.  6/24: Vital stable, procalcitonin remained negative so discontinuing antibiotics, hypoosmolar hyponatremia.  Urine osmolality and sodium elevated as compared to the prior check 2 weeks ago.-Patient is on salt tablets.  S/p thoracentesis with removal of 900 mL of amber-colored pleural fluid.  Labs with  elevated LDH at 116, and protein of 3.5, makes a transudative fluid.  Cultures pending Repeat limited echocardiogram pending.  Starting on Lasix  40 mg daily. Pulmonary is recommending monitoring bilateral pleural effusions likely will need more drainage from left.  Patient will need outpatient PET scan and EBUS if needed to rule out any underlying malignancy.   Assessment and Plan: * Acute respiratory failure with hypoxia (HCC) Patient with no baseline oxygen use.  Initially required nonrebreather, currently on 3 L of oxygen.  Initial concern of HCAP but procalcitonin was negative.  Chest x-ray with concern of increased interstitial markings. CT chest today with bilateral pleural effusions and large pericardial effusion. - Continue with supplemental oxygen -Wean as tolerated  HCAP (healthcare-associated pneumonia) Initial concern of HCAP as he was admitted recently with concern  of pneumonia and completed a course of antibiotic. MRSA PCR negative.  Initially started on cefepime  and vancomycin  and continued on cefepime  only.  Blood cultures negative.  Urine strep pneumo and  Legionella antigen negative. CT chest with contrast today with bilateral pleural effusion and adjacent lung opacities which need further investigation.  Procalcitonin negative -Stopping antibiotics - Continue with supportive care  Pericardial effusion Echocardiogram done on 6/16 with moderate circumferential pericardial effusion.  CT chest today with concern of large pericardial effusions. Patient did receive Lasix  infusion initially which was later discontinued due to decrease in UOP.  Only 200 mL of urine recorded over the past 24 hours.  Renal function seems stable. -Starting on 40 mg IV Lasix  daily -Repeat limited echocardiogram ordered by cardiology-pending - Follow-up cardiology recommendations  Bilateral pleural effusion CT chest today with bilateral pleural effusions left greater than right.  S/p ultrasound-guided left-sided thoracentesis, elevated LDH and protein which makes it transudative fluid.  Procalcitonin negative. Pulmonary was consulted-recommending close monitoring of pleural effusion as he will likely need more thoracentesis. - Preliminary cultures with no organism - Cytology pending - Patient likely need outpatient PET scan and EBUS to rule out any underlying malignancy.   Hyponatremia Seems like having chronic hyponatremia.  No hyponatremia labs in the system.  Has an history of alcohol abuse.  Sodium at 128  and patient is on salt tablets.  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  Labs with hypotonic hyponatremia  -Continue with salt tablets -Monitor sodium  Paroxysmal atrial fibrillation (HCC) Currently in sinus rhythm.  Paroxysmal A-fib with RVR. Not on any anticoagulation at this time. - Continue with metoprolol   HTN (hypertension) Blood pressure borderline soft. - Continuing metoprolol  -Holding home antihypertensives  Alcohol abuse Patient did not drink alcohol since discharge to SNF after prior hospitalization. -Continue thiamine  and folic  acid  Tobacco abuse Counseling was provided -Nicotine  patch as needed      Subjective: Patient was sitting in chair and eating lunch when seen today.  Shortness of breath with significant improvement after thoracentesis.  Remained on 3 L of oxygen.  Physical Exam: Vitals:   09/19/23 2130 09/20/23 0500 09/20/23 0505 09/20/23 0723  BP: 134/81  133/82 (!) 140/86  Pulse: 72  73 77  Resp:   18 17  Temp:   98.7 F (37.1 C) 98.2 F (36.8 C)  TempSrc:   Oral Oral  SpO2:   95% 94%  Weight:  78.1 kg    Height:       General.  Frail gentleman, in no acute distress. Pulmonary.  Lungs clear bilaterally, normal respiratory effort.  Mildly decreased breath sounds at bases CV.  Regular rate and rhythm, no JVD, rub or murmur. Abdomen.  Soft, nontender, nondistended, BS positive. CNS.  Alert and oriented .  No focal neurologic deficit. Extremities.  No edema, no cyanosis, pulses intact and symmetrical.    Data Reviewed: Prior data reviewed.  Family Communication: Discussed with patient.  Disposition: Status is: Inpatient Remains inpatient appropriate because: Severity of illness.  Planned Discharge Destination: Skilled nursing facility  DVT prophylaxis.  Lovenox  Time spent: 50 minutes  This record has been created using Conservation officer, historic buildings. Errors have been sought and corrected,but may not always be located. Such creation errors do not reflect on the standard of care.   Author: Amaryllis Dare, MD 09/20/2023 3:02 PM  For on call review [email or web address].

## 2023-09-20 NOTE — Assessment & Plan Note (Signed)
 Patient with no baseline oxygen use.  Initially required nonrebreather, currently on 3 L of oxygen.  Initial concern of HCAP but procalcitonin was negative.  Chest x-ray with concern of increased interstitial markings. CT chest today with bilateral pleural effusions and large pericardial effusion. - Continue with supplemental oxygen -Wean as tolerated

## 2023-09-20 NOTE — Consult Note (Signed)
 NAME:  Barry Fowler, MRN:  [id number], DOB:  04-Feb-1956, LOS: 6 ADMISSION DATE:  09/14/2023, CONSULTATION DATE:  09/20/2023 REFERRING MD:  Amaryllis Dare, MD, CHIEF COMPLAINT:  Pleural Effusions   History of Present Illness:   Patient is a 68 year old male presenting to the hospital with hypoxic respiratory failure and increased shortness of breath. Pulmonary is consulted to comment on the pleural effusions and mediastinal lymphadenopathy.  Patient was recently admitted to the hospital 09/05/2023 to 09/13/2023 after a fall.  He was noted to have severe hyponatremia and his alcohol withdrawal was treated with phenobarbital .  With improvement in his sodium, he was discharged to rehab where he lasted 24 hours prior to representing with acute hypoxic respiratory failure.  Patient reports that his symptoms were acute in onset with sudden worsening of his shortness of breath while at rehab.  There, he had escalating oxygen requirements to 15 L via nasal cannula prompting EMS activation and representation back to the hospital.  He does not have any fevers, chills, chest pain, night sweats, or signs of systemic illness.  He does not report any weight loss.  On admission to the hospital on 09/14/2023, he was noted to have bilateral pleural effusions as well as a pericardial effusion.  Review of the record notes moderately sized pericardial effusion on TTE from 09/12/2023 which was managed conservatively given no signs of tamponade.    A dedicated CT scan of the chest was performed confirming the bilateral effusions, bilateral lower lobe atelectasis, hilar and mediastinal lymphadenopathy, and a pericardial effusion. With the findings of the bilateral pleural effusions, IR was consulted and he underwent left-sided thoracentesis yesterday. Patient was also treated with a course of antibiotics during his hospitalization, as well as metoprolol  for afib rate control. Strep and Legionella Urine antigens were negative, and  procalcitonin was similarly negative.  With the thoracentesis, he felt much better with less labored breathing.   Pertinent  Medical History   -Afib -EtOH Use -Pericardial effusion -Recurrent falls -Tobacco use disroder  Objective    Blood pressure (!) 140/86, pulse 77, temperature 98.2 F (36.8 C), temperature source Oral, resp. rate 17, height 6' (1.829 m), weight 78.1 kg, SpO2 94%.        Intake/Output Summary (Last 24 hours) at 09/20/2023 0754 Last data filed at 09/20/2023 0306 Gross per 24 hour  Intake 1330.16 ml  Output 875 ml  Net 455.16 ml   Filed Weights   09/18/23 0500 09/19/23 0550 09/20/23 0500  Weight: 81.8 kg 82 kg 78.1 kg    Examination: Physical Exam Constitutional:      General: He is not in acute distress.    Appearance: He is ill-appearing.   Cardiovascular:     Rate and Rhythm: Normal rate and regular rhythm.     Pulses: Normal pulses.     Heart sounds: Normal heart sounds.  Pulmonary:     Effort: Pulmonary effort is normal.     Breath sounds: Rales present. No wheezing or rhonchi.   Neurological:     General: No focal deficit present.     Mental Status: He is alert. Mental status is at baseline.     POCUS of R. Pleural Effusion    POCUS of L. Pleural Effusion    Assessment and Plan   #Acute Hypoxic Respiratory Failure #Bilateral Pleural Effusion #Mediastinal Lymphadenopathy #Pericardial Effusion #Acute Decompensated HFpEF #Alcohol Use Disorder  Patient is a 68 year old male with a past medical history of alcohol use disorder as well as a  pericardial effusion who presented to the hospital with acute hypoxic respiratory failure secondary to bilateral pleural effusions, bilateral lower lobe atelectasis, and a large pericardial effusion.  Both his pleural effusions are simple appearing and free-flowing on point-of-care ultrasound.  He underwent left-sided thoracentesis yesterday showing a protein level of 3.4 which is consistent with  an exudative process based on pleural fluid only three test combination approach. The fluid was sent for cytology which is currently pending (as is pleural cholesterol).  On repeat point-of-care ultrasound of both pleural spaces, there is a residual moderate-sized left-sided pleural effusion that remains simple appearing.  A small to moderate-sized simple appearing right-sided pleural effusion is also present, though sampling is limited by the presence of the lip of the lung close to the pleura.  The differential for his exudative pleural effusion is broad and includes decompensated heart failure (bilateral effusions, afib, pericardial effusion), malignancy (given severe hyponatremia and enlarged lymph nodes), as well as serositis (in conjunction with pericardial effusion).   I would recommend full drainage of both pleural spaces followed by a repeat CT scan of the chest with IV contrast to evaluate for any underlying mass or nodularity. I will continue to follow the patient with point-of-care ultrasound to evaluate both pleural spaces and for consideration of repeat thoracentesis vs pig tail placement. His right sided effusion is not currently amenable to intervention. Given thoracentesis last evening, I would prefer to wait 24 hours prior to attempting a repeat intervention on the left pleural space. Finally, I will consider outpatient PET/CT as well as EBUS/TBNA to sample the hilar and mediastinal lymph nodes should diagnostic uncertainty remain (no underlying mass/lesion with pleural drainage, pleural cytology negative, etc...),   -Will continue with daily POCUS to monitor pleural effusions -Check pleural fluid cholesterol, serum LDH (sent) -Follow up pleural fluid cytology -TTE ordered, appreciate input from cardiology -Pulmonary will continue to follow  Belva November, MD [facility] 09/20/2023 9:43 AM    Labs   CBC: Recent Labs  Lab 09/14/23 2055 09/15/23 0511  09/16/23 0517 09/17/23 0442 09/18/23 0540 09/19/23 0147  WBC 12.1* 11.1* 15.9* 14.2* 10.6* 9.0  NEUTROABS 8.9*  --   --   --   --   --   HGB 10.9* 11.1* 10.4* 10.6* 10.2* 9.5*  HCT 31.5* 32.1* 29.7* 30.8* 28.8* 28.0*  MCV 89.2 88.4 87.6 89.0 87.0 89.5  PLT 392 343 359 371 390 380    Basic Metabolic Panel: Recent Labs  Lab 09/15/23 0511 09/16/23 0517 09/17/23 0442 09/17/23 0947 09/18/23 0540 09/18/23 1619 09/19/23 0147  NA 125* 126* 122* 122* 125* 128* 128*  K 3.8 4.1 4.0 4.0 3.3* 4.1 4.4  CL 91* 93* 91* 90* 88* 89* 91*  CO2 24 23 23 22 27 27 27   GLUCOSE 138* 125* 127* 153* 136* 131* 130*  BUN 7* 11 12 13 9 15 15   CREATININE 0.49* 0.51* 0.47* 0.44* 0.42* 0.57* 0.51*  CALCIUM  8.1* 8.0* 8.0* 7.9* 8.3* 8.4* 8.8*  MG 1.6* 2.1 1.9  --  1.6*  --   --    GFR: Estimated Creatinine Clearance: 99.7 mL/min (A) (by C-G formula based on SCr of 0.51 mg/dL (L)). Recent Labs  Lab 09/14/23 2055 09/14/23 2319 09/15/23 0511 09/16/23 0517 09/17/23 0442 09/18/23 0540 09/19/23 0147 09/19/23 1658  PROCALCITON <0.10  --   --   --   --   --   --  <0.10  WBC 12.1*  --  11.1* 15.9* 14.2* 10.6* 9.0  --  LATICACIDVEN  --  0.9 0.8  --   --   --   --   --     Liver Function Tests: No results for input(s): AST, ALT, ALKPHOS, BILITOT, PROT, ALBUMIN  in the last 168 hours. No results for input(s): LIPASE, AMYLASE in the last 168 hours. No results for input(s): AMMONIA in the last 168 hours.  ABG No results found for: PHART, PCO2ART, PO2ART, HCO3, TCO2, ACIDBASEDEF, O2SAT   Coagulation Profile: No results for input(s): INR, PROTIME in the last 168 hours.  Cardiac Enzymes: No results for input(s): CKTOTAL, CKMB, CKMBINDEX, TROPONINI in the last 168 hours.  HbA1C: No results found for: HGBA1C  CBG: No results for input(s): GLUCAP in the last 168 hours.  Review of Systems:   Review of Systems  Constitutional:  Positive for  malaise/fatigue. Negative for chills, fever and weight loss.  Respiratory:  Positive for shortness of breath. Negative for cough, hemoptysis, sputum production and wheezing.   Cardiovascular:  Negative for chest pain.     Past Medical History:  He,  has a past medical history of Hypertension.   Surgical History:   Past Surgical History:  Procedure Laterality Date   EYE SURGERY       Social History:   reports that he has been smoking cigarettes. He has a 42 pack-year smoking history. He has never used smokeless tobacco. He reports current alcohol use. He reports that he does not use drugs.   Family History:  His family history includes Heart attack in his father.   Allergies No Known Allergies   Home Medications  Prior to Admission medications   Medication Sig Start Date End Date Taking? Authorizing Provider  ascorbic acid  (VITAMIN C ) 500 MG tablet Take 1 tablet (500 mg total) by mouth 2 (two) times daily. 09/13/23  Yes Sreenath, Sudheer B, MD  folic acid  (FOLVITE ) 1 MG tablet Take 1 tablet (1 mg total) by mouth daily. 12/11/20  Yes Johnson, Clanford L, MD  metoprolol  tartrate (LOPRESSOR ) 25 MG tablet Take 1 tablet (25 mg total) by mouth 2 (two) times daily. 09/13/23  Yes Sreenath, Sudheer B, MD  Multiple Vitamin (MULTIVITAMIN WITH MINERALS) TABS tablet Take 1 tablet by mouth daily. 01/19/20  Yes Shah, Pratik D, DO  nystatin  powder Apply 1 Application topically 2 (two) times daily.   Yes [provider]  pantoprazole  (PROTONIX ) 40 MG tablet Take 1 tablet (40 mg total) by mouth daily. 09/13/23 09/12/24 Yes Sreenath, Sudheer B, MD  polyethylene glycol (MIRALAX  / GLYCOLAX ) 17 g packet Take 17 g by mouth daily as needed for mild constipation. 12/10/20  Yes Johnson, Clanford L, MD  pravastatin (PRAVACHOL) 20 MG tablet Take 20 mg by mouth daily. 10/16/19  Yes [provider]  sodium chloride  1 g tablet Take 1 tablet (1 g total) by mouth 3 (three) times daily with meals.  09/13/23  Yes Sreenath, Sudheer B, MD  thiamine  100 MG tablet Take 1 tablet (100 mg total) by mouth daily. 01/19/20  Yes Shah, Pratik D, DO  feeding supplement (ENSURE PLUS HIGH PROTEIN) LIQD Take 237 mLs by mouth 3 (three) times daily between meals. 09/13/23   Sreenath, Calvin NOVAK, MD  losartan (COZAAR) 25 MG tablet Take 25 mg by mouth daily. Patient not taking: Reported on 09/15/2023 06/20/23   [provider]      I spent 82 minutes caring for this patient today, including preparing to see the patient, obtaining a medical history , reviewing a separately obtained history,  performing a medically appropriate examination and/or evaluation, counseling and educating the patient/family/caregiver, ordering medications, tests, or procedures, referring and communicating with other health care professionals (not separately reported), documenting clinical information in the electronic health record, and independently interpreting results (not separately reported/billed) and communicating results to the patient/family/caregiver

## 2023-09-20 NOTE — Progress Notes (Signed)
 Occupational Therapy Treatment Patient Details Name: Quadry Kampa MRN: [id number] DOB: 05/24/1955 Today's Date: 09/20/2023   History of present illness Pt is a 68 year old male presents with SOB, admitted with acute hypoxemic respiratory failure secondary to suspected HCAP    PMH significant for tobacco abuse, alcohol abuse, hypertension, chronic hyponatremia   OT comments  Upon entering the room, pt seated in recliner chair and agreeable to OT intervention. Pt remains on 3Ls O2 via [place] throughout session. Pt stands with min cues for hand placement and mod lifting assistance with focus on anterior weight shift. Pt ambulates with min - mod A 20' with RW into bathroom and sits on commode. Pt unable to void and takes rest break before standing with mod A from low commode surface and returning to recliner chair. Pt is very motivated this session but does fatigue quickly. He returns to recliner chair in order to sit up to eat lunch once it arrives. Chair alarm activated and call bell within reach.       If plan is discharge home, recommend the following:  Help with stairs or ramp for entrance;A lot of help with walking and/or transfers;A lot of help with bathing/dressing/bathroom;Assistance with cooking/housework   Equipment Recommendations  Other (comment) (defer to next venue of care)       Precautions / Restrictions Precautions Precautions: Fall       Mobility Bed Mobility               General bed mobility comments: seated in recliner chair at beginning and end of session    Transfers Overall transfer level: Needs assistance Equipment used: Rolling walker (2 wheels) Transfers: Sit to/from Stand Sit to Stand: Mod assist                 Balance Overall balance assessment: Needs assistance Sitting-balance support: No upper extremity supported, Feet supported Sitting balance-Leahy Scale: Good     Standing balance support: Bilateral upper extremity supported, Reliant on  assistive device for balance, During functional activity Standing balance-Leahy Scale: Fair                             ADL either performed or assessed with clinical judgement   ADL Overall ADL's : Needs assistance/impaired                         Toilet Transfer: Moderate assistance;Ambulation;Regular Toilet;Grab bars;Rolling walker (2 wheels)                  Extremity/Trunk Assessment Upper Extremity Assessment Upper Extremity Assessment: Overall WFL for tasks assessed            Vision Patient Visual Report: No change from baseline           Communication Communication Communication: No apparent difficulties   Cognition Arousal: Alert Behavior During Therapy: WFL for tasks assessed/performed Cognition: No apparent impairments                               Following commands: Intact Following commands impaired: Follows one step commands with increased time      Cueing   Cueing Techniques: Verbal cues             Pertinent Vitals/ Pain       Pain Assessment Pain Assessment: Faces Faces Pain Scale: Hurts a little bit Pain  Location: low back Pain Descriptors / Indicators: Aching, Discomfort Pain Intervention(s): Monitored during session, Repositioned         Frequency  Min 2X/week        Progress Toward Goals  OT Goals(current goals can now be found in the care plan section)  Progress towards OT goals: Progressing toward goals      AM-PAC OT 6 Clicks Daily Activity     Outcome Measure   Help from another person eating meals?: None Help from another person taking care of personal grooming?: A Little Help from another person toileting, which includes using toliet, bedpan, or urinal?: A Lot Help from another person bathing (including washing, rinsing, drying)?: A Lot Help from another person to put on and taking off regular upper body clothing?: A Little Help from another person to put on and taking  off regular lower body clothing?: A Lot 6 Click Score: 16    End of Session Equipment Utilized During Treatment: Rolling walker (2 wheels)  OT Visit Diagnosis: Other abnormalities of gait and mobility (R26.89);Muscle weakness (generalized) (M62.81)   Activity Tolerance Patient tolerated treatment well   Patient Left in bed;with call bell/phone within reach;Other (comment)   Nurse Communication Mobility status        Time: 8884-8867 OT Time Calculation (min): 17 min  Charges: OT General Charges $OT Visit: 1 Visit OT Treatments $Self Care/Home Management : 8-22 mins  Izetta Claude, MS, OTR/L , CBIS ascom [phone]  09/20/23, 11:42 AM

## 2023-09-20 NOTE — Progress Notes (Signed)
*  PRELIMINARY RESULTS* Echocardiogram 2D Echocardiogram has been performed.  Barry Fowler 09/20/2023, 2:03 PM

## 2023-09-20 NOTE — TOC Initial Note (Signed)
 Transition of Care [facility]) - Initial/Assessment Note    Patient Details  Name: Barry Fowler MRN: [id number] Date of Birth: 1955-07-07  [facility] CM/SW Contact:    Quintella Suzen Jansky, RN Phone Number: 09/20/2023, 11:28 AM  Clinical Narrative:                  Met with patient at bedside, discussed discharge plan. He would like to return to [facility] if they are will to take him back for short term rehab. PASRR, FL2 pending MD signature and referral sent to [facility] is pending. Expected Discharge Plan: Skilled Nursing Facility Barriers to Discharge: Continued Medical Work up   Patient Goals and CMS Choice Patient states their goals for this hospitalization and ongoing recovery are:: go back to [facility] CMS Medicare.gov Compare Post Acute Care list provided to:: Patient Choice offered to / list presented to : Patient      Expected Discharge Plan and Services     Post Acute Care Choice: Skilled Nursing Facility Living arrangements for the past 2 months: Apartment                   DME Agency: NA       HH Arranged: NA          Prior Living Arrangements/Services Living arrangements for the past 2 months: Apartment Lives with:: Self Patient language and need for interpreter reviewed:: Yes Do you feel safe going back to the place where you live?: Yes      Need for Family Participation in Patient Care: No (Comment) Care giver support system in place?: No (comment) Current home services: DME Criminal Activity/Legal Involvement Pertinent to Current Situation/Hospitalization: No - Comment as needed  Activities of Daily Living      Permission Sought/Granted                  Emotional Assessment Appearance:: Appears stated age     Orientation: : Oriented to Self, Oriented to Place, Oriented to  Time, Oriented to Situation Alcohol / Substance Use: Not Applicable Psych Involvement: No (comment)  Admission diagnosis:  Shortness of breath  [R06.02] Hypoxia [R09.02] HCAP (healthcare-associated pneumonia) [J18.9] Hypervolemia, unspecified hypervolemia type [E87.70] Pneumonia due to infectious organism, unspecified laterality, unspecified part of lung [J18.9] Patient Active Problem List   Diagnosis Date Noted   Pericardial effusion 09/19/2023   Bilateral pleural effusion 09/19/2023   HCAP (healthcare-associated pneumonia) 09/14/2023   Acute respiratory failure with hypoxia (HCC) 09/14/2023   HTN (hypertension) 09/14/2023   Tobacco abuse 09/14/2023   Paroxysmal atrial fibrillation (HCC) 09/10/2023   Pressure injury of skin 09/10/2023   Malnutrition of moderate degree 09/07/2023   Acute metabolic encephalopathy 09/05/2023   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   Hypokalemia 12/03/2020   Abnormal LFTs (liver function tests)    Melena    Hyponatremia 01/11/2020   Alcohol abuse 01/11/2020   PCP:  [facility] Pharmacy:   [id number] - [place], [place] - [address] [place] [address] Phone: [phone] Fax: [phone]  [facility] - [place], [place] - [address] [place] [address] Phone: [phone] Fax: [phone]     Social Drivers of Health (SDOH) Social History: SDOH Screenings   Food Insecurity: No Food Insecurity (09/16/2023)  Housing: Low Risk  (09/16/2023)  Transportation Needs: No Transportation Needs (09/16/2023)  Utilities: Not At Risk (09/16/2023)  Social Connections: Unknown (09/16/2023)  Tobacco Use: High Risk (  09/15/2023)   SDOH Interventions:     Readmission Risk Interventions     No data to display

## 2023-09-20 NOTE — Consult Note (Signed)
 Cardiology Consultation   Patient ID: Barry Fowler MRN: [id number]; DOB: 1955-11-13  Admit date: 09/14/2023 Date of Consult: 09/20/2023  PCP:  [facility]   [facility] Providers Cardiologist:  Nina Hoar   Patient Profile: Barry Fowler is a 68 y.o. male with a hx of moderate pericardial effusion by recent echoes, paroxysmal atrial fibrillation, hypertension, alcohol abuse complicated by chronic hyponatremia and recurrent falls who is being seen 09/20/2023 for the evaluation of pericardial effusion at the request of Dr. Caleen.  History of Present Illness: Barry Fowler was hospitalized earlier this month with syncope.  It was unclear how long he was down for.  He was noted to be severely hyponatremic with a sodium of 104.  In the ICU, he developed atrial fibrillation with rapid ventricular response and ultimately converted with metoprolol  to sinus rhythm.  Echo showed moderate pericardial effusion with elevated CVP and no evidence of cardiac tamponade.  He was diuresed gently.  He was prescribed metoprolol  tartrate 25 mg twice daily.  He was felt to be a poor anticoagulation clinic candidate given recurrent falls and ongoing alcohol abuse.  He was discharged to nursing facility but was readmitted a day later due to hypoxia.  He denies feeling short of breath as well as having chest pain, palpitations, lightheadedness, and edema.  CT chest yesterday was concerning for a large pericardial effusion with some pericardial thickening.  Moderate pleural effusions, left greater than right were also noted).  He underwent left thoracentesis yesterday yielding 900 mL of amber fluid.   Past Medical History:  Diagnosis Date   Chronic hyponatremia    Hypertension    Paroxysmal atrial fibrillation (HCC)    Pericardial effusion 08/2023    Past Surgical History:  Procedure Laterality Date   EYE SURGERY        Scheduled Meds:  ascorbic acid   500 mg Oral BID   enoxaparin   (LOVENOX ) injection  40 mg Subcutaneous Q24H   feeding supplement  237 mL Oral BID BM   folic acid   1 mg Oral Daily   furosemide   40 mg Intravenous Daily   lidocaine   1 patch Transdermal Q24H   metoprolol  tartrate  25 mg Oral BID   multivitamin with minerals  1 tablet Oral Daily   nicotine   21 mg Transdermal Daily   nystatin   1 Application Topical BID   pantoprazole   40 mg Oral Daily   polyethylene glycol  17 g Oral Daily   sodium chloride   1 g Oral TID WC   thiamine   100 mg Oral Daily   Continuous Infusions:  PRN Meds: albuterol , dextromethorphan -guaiFENesin , hydrALAZINE, ibuprofen , ondansetron  (ZOFRAN ) IV, senna-docusate  Allergies:   No Known Allergies  Social History:   Social History   Tobacco Use   Smoking status: Every Day    Current packs/day: 1.00    Average packs/day: 1 pack/day for 42.0 years (42.0 ttl pk-yrs)    Types: Cigarettes   Smokeless tobacco: Never  Substance Use Topics   Alcohol use: Yes    Comment: beer daily   Drug use: Never     Family History:   Family History  Problem Relation Age of Onset   Heart attack Father      ROS:  Please see the history of present illness. All other ROS reviewed and negative.     Physical Exam/Data: Vitals:   09/20/23 0500 09/20/23 0505 09/20/23 0723 09/20/23 1508  BP:  133/82 (!) 140/86 121/80  Pulse:  73 77 72  Resp:  18 17 17   Temp:  98.7 F (37.1 C) 98.2 F (36.8 C) 97.8 F (36.6 C)  TempSrc:  Oral Oral   SpO2:  95% 94% 95%  Weight: 78.1 kg     Height:        Intake/Output Summary (Last 24 hours) at 09/20/2023 1749 Last data filed at 09/20/2023 0900 Gross per 24 hour  Intake 700 ml  Output 525 ml  Net 175 ml      09/20/2023    5:00 AM 09/19/2023    5:50 AM 09/18/2023    5:00 AM  Last 3 Weights  Weight (lbs) 172 lb 2.9 oz 180 lb 12.4 oz 180 lb 5.4 oz  Weight (kg) 78.1 kg 82 kg 81.8 kg     Body mass index is 23.35 kg/m.  General: Chronically ill-appearing man lying comfortably in  bed. HEENT: normal Neck: JVP approximately 8 cm. Vascular: No carotid bruits; Distal pulses 2+ bilaterally Cardiac: Regular rate and rhythm without murmurs. Lungs: Diminished breath sounds at the lung bases, right greater than left. Abd: soft, nontender, no hepatomegaly  Ext: no edema Musculoskeletal:  No deformities, BUE and BLE strength normal and equal Skin: warm and dry  Neuro:  CNs 2-12 intact, no focal abnormalities noted Psych: Flat affect.  EKG:  The EKG from 09/14/2023 was personally reviewed and demonstrates: Normal sinus rhythm with low voltage and nonspecific T wave abnormalities. Telemetry: Patient is not on telemetry at this time.  Relevant CV Studies: See echocardiogram below.  Laboratory Data: High Sensitivity Troponin:   Recent Labs  Lab 09/06/23 0347 09/06/23 0705 09/14/23 2055  TROPONINIHS 21* 15 17     Chemistry Recent Labs  Lab 09/16/23 0517 09/17/23 0442 09/17/23 0947 09/18/23 0540 09/18/23 1619 09/19/23 0147  NA 126* 122*   < > 125* 128* 128*  K 4.1 4.0   < > 3.3* 4.1 4.4  CL 93* 91*   < > 88* 89* 91*  CO2 23 23   < > 27 27 27   GLUCOSE 125* 127*   < > 136* 131* 130*  BUN 11 12   < > 9 15 15   CREATININE 0.51* 0.47*   < > 0.42* 0.57* 0.51*  CALCIUM  8.0* 8.0*   < > 8.3* 8.4* 8.8*  MG 2.1 1.9  --  1.6*  --   --   GFRNONAA >60 >60   < > >60 >60 >60  ANIONGAP 10 8   < > 10 12 10    < > = values in this interval not displayed.    No results for input(s): PROT, ALBUMIN , AST, ALT, ALKPHOS, BILITOT in the last 168 hours. Lipids No results for input(s): CHOL, TRIG, HDL, LABVLDL, LDLCALC, CHOLHDL in the last 168 hours.  Hematology Recent Labs  Lab 09/17/23 0442 09/18/23 0540 09/19/23 0147  WBC 14.2* 10.6* 9.0  RBC 3.46* 3.31* 3.13*  HGB 10.6* 10.2* 9.5*  HCT 30.8* 28.8* 28.0*  MCV 89.0 87.0 89.5  MCH 30.6 30.8 30.4  MCHC 34.4 35.4 33.9  RDW 14.6 14.3 14.4  PLT 371 390 380   Thyroid No results for input(s): TSH,  FREET4 in the last 168 hours.  BNP Recent Labs  Lab 09/14/23 2055 09/17/23 0442 09/19/23 0147  BNP 412.4* 207.4* 202.9*    DDimer No results for input(s): DDIMER in the last 168 hours.  Radiology/Studies:  ECHOCARDIOGRAM LIMITED Result Date: 09/20/2023    ECHOCARDIOGRAM LIMITED REPORT   Patient Name:   Barry Fowler Date of Exam: 09/20/2023 Medical  Rec #:  [id number]   Height:       72.0 in Accession #:    [id number]  Weight:       172.2 lb Date of Birth:  1955/12/06   BSA:          1.999 m Patient Age:    67 years    BP:           140/86 mmHg Patient Gender: M           HR:           77 bpm. Exam Location:  [facility] Procedure: Limited Echo, Color Doppler and Cardiac Doppler (Both Spectral and            Color Flow Doppler were utilized during procedure). Indications:     Pericardial Effusion I31.3  History:         Patient has prior history of Echocardiogram examinations, most                  recent 09/12/2023. Risk Factors:Hypertension.  Sonographer:     Zyan Coby Furnace Referring Phys:  [id number] FLYJFFJI A ARIDA Diagnosing Phys: Lonni Hanson MD IMPRESSIONS  1. Left ventricular ejection fraction, by estimation, is 60 to 65%. The left ventricle has normal function. There is mild left ventricular hypertrophy.  2. Right ventricular systolic function is low normal. The right ventricular size is normal. There is normal pulmonary artery systolic pressure.  3. Right atrial size was mildly dilated.  4. Moderate pericardial effusion. The pericardial effusion is posterior to the left ventricle and lateral to the left ventricle. There is no evidence of cardiac tamponade.  5. The aortic valve is tricuspid.  6. The inferior vena cava is dilated in size with >50% respiratory variability, suggesting right atrial pressure of 8 mmHg. Comparison(s): A prior study was performed on 09/12/2023. Pericardial effusion appears similar in size but is less circumferential and more localized to the posterolateral left ventricle. FINDINGS   Left Ventricle: Left ventricular ejection fraction, by estimation, is 60 to 65%. The left ventricle has normal function. The left ventricular internal cavity size was normal in size. There is mild left ventricular hypertrophy. Right Ventricle: The right ventricular size is normal. Right ventricular systolic function is low normal. There is normal pulmonary artery systolic pressure. The tricuspid regurgitant velocity is 1.62 m/s, and with an assumed right atrial pressure of 8 mmHg, the estimated right ventricular systolic pressure is 18.5 mmHg. Left Atrium: Left atrial size was normal in size. Right Atrium: Right atrial size was mildly dilated. Pericardium: A moderately sized pericardial effusion is present. The pericardial effusion is posterior to the left ventricle and lateral to the left ventricle. There is no evidence of cardiac tamponade. Tricuspid Valve: The tricuspid valve is not well visualized. Aortic Valve: The aortic valve is tricuspid. Pulmonic Valve: The pulmonic valve was not well visualized. Pulmonic valve regurgitation is not visualized. No evidence of pulmonic stenosis. Aorta: The aortic root is normal in size and structure. Venous: The inferior vena cava is dilated in size with greater than 50% respiratory variability, suggesting right atrial pressure of 8 mmHg. Additional Comments: There is a small pleural effusion in the left lateral region. Spectral Doppler performed. Color Doppler performed.  LEFT VENTRICLE PLAX 2D LVIDd:         3.70 cm LVIDs:         2.70 cm LV PW:         1.20 cm LV IVS:  1.20 cm LVOT diam:     2.00 cm LVOT Area:     3.14 cm  RIGHT VENTRICLE RV Basal diam:  4.00 cm RV Mid diam:    3.40 cm LEFT ATRIUM           Index        RIGHT ATRIUM           Index LA diam:      3.10 cm 1.55 cm/m   RA Area:     23.40 cm LA Vol (A2C): 46.7 ml 23.36 ml/m  RA Volume:   75.00 ml  37.52 ml/m LA Vol (A4C): 16.3 ml 8.15 ml/m   AORTA Ao Root diam: 3.50 cm TRICUSPID VALVE TR Peak grad:    10.5 mmHg TR Vmax:        162.00 cm/s  SHUNTS Systemic Diam: 2.00 cm Lonni Hanson MD Electronically signed by Lonni Hanson MD Signature Date/Time: 09/20/2023/3:27:51 PM    Final    US  THORACENTESIS ASP PLEURAL SPACE W/IMG GUIDE Addendum Date: 09/20/2023 ADDENDUM REPORT: 09/20/2023 13:19 ADDENDUM: This was a LEFT sided thoracentesis yielding of amber fluid. Electronically Signed   By: Cordella Banner   On: 09/20/2023 13:19   Result Date: 09/20/2023 INDICATION: 68 year old male who presented to the ED with shortness of breath. Imaging concerning for pulmonary edema and possible pneumonia. Request for diagnostic and therapeutic thoracentesis. EXAM: ULTRASOUND GUIDED right THORACENTESIS MEDICATIONS: 1% lidocaine , 8 mL. COMPLICATIONS: None immediate. PROCEDURE: An ultrasound guided thoracentesis was thoroughly discussed with the patient and questions answered. The benefits, risks, alternatives and complications were also discussed. The patient understands and wishes to proceed with the procedure. Written consent was obtained. Ultrasound was performed to localize and mark an adequate pocket of fluid in the right chest. The area was then prepped and draped in the normal sterile fashion. 1% Lidocaine  was used for local anesthesia. Under ultrasound guidance a 6 Fr Safe-T-Centesis catheter was introduced. Thoracentesis was performed. The catheter was removed and a dressing applied. FINDINGS: A total of approximately 900 mL of amber fluid was removed. Samples were sent to the laboratory as requested by the clinical team. IMPRESSION: Successful ultrasound guided right thoracentesis yielding 900 mL of pleural fluid. Procedure performed by: Sherrilee Bal, PA-C under the supervision of Dr. KANDICE Banner Electronically Signed: By: Cordella Banner On: 09/20/2023 07:57   DG Chest Port 1 View Result Date: 09/19/2023 CLINICAL DATA:  Left pleural effusion status post thoracentesis. EXAM: PORTABLE CHEST 1 VIEW  COMPARISON:  Chest x-ray 09/17/2023 FINDINGS: Small left pleural effusion is present, significantly decreased. There is no pneumothorax. Small right pleural effusion has also decreased. There are patchy opacities in the right lung base. The heart is enlarged, unchanged. No acute fractures are seen. IMPRESSION: 1. Small left pleural effusion, significantly decreased. No pneumothorax. 2. Small right pleural effusion, decreased. 3. Patchy opacities in the right lung base, likely atelectasis. Electronically Signed   By: Greig Pique M.D.   On: 09/19/2023 16:58   CT CHEST W CONTRAST Result Date: 09/19/2023 CLINICAL DATA:  Chronic dyspnea of unclear etiology. Recent episode of hyponatremia EXAM: CT CHEST WITH CONTRAST TECHNIQUE: Multidetector CT imaging of the chest was performed during intravenous contrast administration. RADIATION DOSE REDUCTION: This exam was performed according to the departmental dose-optimization program which includes automated exposure control, adjustment of the mA and/or kV according to patient size and/or use of iterative reconstruction technique. CONTRAST:  75mL OMNIPAQUE IOHEXOL 300 MG/ML  SOLN COMPARISON:  X-ray 09/17/2023. FINDINGS: Cardiovascular: Heart  is nonenlarged but there is a large pericardial effusion. Pericardial thickening as well. Please correlate with symptoms and further workup when appropriate. Coronary artery calcifications are seen. Please correlate for other coronary risk factors. The thoracic aorta has a normal course and caliber with some scattered calcified plaque. Mediastinum/Nodes: Preserved thyroid gland. Slightly patulous thoracic esophagus. Question small hiatal hernia. No specific abnormal lymph node enlargement identified in the axillary regions. There is some prominent bilateral hilar nodes. Example on the right has short axis of 10 mm on series 2, image 82. Smaller on the left. Is also some mediastinal nodes. Example medially anterior to the right main  bronchus measures 16 x 22 mm on image 73 of series 2. Again increased from previous. There also some prominent internal mammary chain lymph nodes. Example on the right on image 47 measures 8 by 7 mm. Focus on the left on image 70 measures a by 6 mm. Lungs/Pleura: Moderate bilateral pleural effusions are seen, left greater than right with adjacent parenchymal lung opacities in the lower lobes. Atelectasis versus infiltrate. There is some dependent opacities as well in the upper lobes which could be more atelectatic. Areas of peripheral interstitial septal thickening identified bilaterally. Emphysematous lung changes identified. Upper Abdomen: Slight nodular contour to the liver. Slight thickening of the adrenal glands bilaterally, nonspecific. Stomach is underdistended. Question some fold thickening. Musculoskeletal: Anasarca. Multiple old right-sided rib fractures. Few left-sided rib fractures as well posteriorly. Scattered degenerative changes. Diffuse breathing motion. IMPRESSION: Large pericardial effusion with some pericardial thickening. Please correlate with any known history such as for pericarditis or dedicated workup is recommended including potential echocardiography. Moderate pleural effusions with adjacent lung opacities, left greater than right. Few enlarged and prominent mediastinal nodes identified including internal mammary chain. These could be reactive but do have a differential recommend short follow-up or additional workup with PET-CT as clinically directed. Slightly nodular contour to the liver. Aortic Atherosclerosis (ICD10-I70.0) and Emphysema (ICD10-J43.9). Findings will be called to the ordering service by the Radiology physician assistant team Electronically Signed   By: Ranell Bring M.D.   On: 09/19/2023 14:33   DG Chest 1 View Result Date: 09/17/2023 CLINICAL DATA:  Hypoxemia EXAM: CHEST  1 VIEW COMPARISON:  09/14/2023 FINDINGS: LEFT stable enlarged cardiac silhouette. Low lung  volumes. Bilateral pleural effusions similar prior. Mild increase in central venous congestion/pulmonary edema. No pneumothorax. IMPRESSION: 1. Mild increase in central venous congestion/pulmonary edema. 2. Stable bilateral pleural effusions. 3. Low lung volumes. Electronically Signed   By: Jackquline Boxer M.D.   On: 09/17/2023 12:02     Assessment and Plan: Pericardial effusion: Patient presents with hypoxia albeit without any respiratory symptoms like shortness of breath or cough.  He was noted to have moderate bilateral pleural effusions on CT chest yesterday as well as a large pericardial effusion.  Echocardiogram today shows more of a moderate pericardial effusion that is predominantly posterolateral to the left ventricle and not well-suited to pericardiocentesis.  There is no evidence of tamponade physiology.  Patient also remains hemodynamically stable.  I recommend gentle diuresis.  I will check inflammatory markers (ESR and CRP) with low threshold to add colchicine if positive.  Given pleural thickening, pericardial effusion, and pleural effusions, malignancy is also a consideration.  Await cytology from thoracentesis performed yesterday.  Pleural effusions: Etiology remains somewhat unclear, though element of HFpEF/right heart failure and/or underlying liver disease could be contributing.  Albumin  is also quite low at 2.8, suggesting some degree of third spacing.  We will  diurese as above.  Patient is already status post therapeutic thoracentesis on the left yesterday.  Paroxysmal atrial fibrillation: Patient is not on telemetry and has not had any EKG this admission.  I will check an EKG though by exam he seems to be in sinus rhythm.  He remains a poor candidate for anticoagulation given history of recurrent falls and ongoing alcohol abuse.  Continue metoprolol  tartrate 25 mg twice daily.  Hyponatremia: Chronic and near baseline.  Continue diuresis as above.  For questions or updates,  please contact [facility] Please consult [email or web address] for contact info under    Signed, Lonni Hanson, MD  09/20/2023 5:49 PM

## 2023-09-20 NOTE — Assessment & Plan Note (Signed)
 CT chest today with bilateral pleural effusions left greater than right.  S/p ultrasound-guided left-sided thoracentesis, elevated LDH and protein which makes it transudative fluid.  Procalcitonin negative. Pulmonary was consulted-recommending close monitoring of pleural effusion as he will likely need more thoracentesis. Patient underwent another left thoracentesis today - Preliminary cultures with no organism - Cytology pending - Patient likely need outpatient PET scan and EBUS to rule out any underlying malignancy.

## 2023-09-20 NOTE — Assessment & Plan Note (Signed)
 Seems like having chronic hyponatremia.  No hyponatremia labs in the system.  Has an history of alcohol abuse.  Sodium at 128  and patient is on salt tablets.  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  Labs with hypotonic hyponatremia  -Continue with salt tablets -Monitor sodium

## 2023-09-20 NOTE — NC FL2 (Signed)
 [place]  MEDICAID FL2 LEVEL OF CARE FORM     IDENTIFICATION  Patient Name: Barry Fowler Birthdate: 06/28/1955 Sex: male Admission Date (Current Location): 09/14/2023  [facility] and [place] Number:  Chiropodist and Address:  [facility], [address], [place], [place] [address]      Provider Number: [id number]  Attending Physician Name and Address:  Caleen Qualia, MD  Relative Name and Phone Number:  Sister: Madison Kitty [phone]    Current Level of Care: Hospital Recommended Level of Care: Skilled Nursing Facility Prior Approval Number:    Date Approved/Denied:   PASRR Number: [id number]  Discharge Plan: SNF    Current Diagnoses: Patient Active Problem List   Diagnosis Date Noted   Pericardial effusion 09/19/2023   Bilateral pleural effusion 09/19/2023   HCAP (healthcare-associated pneumonia) 09/14/2023   Acute respiratory failure with hypoxia (HCC) 09/14/2023   HTN (hypertension) 09/14/2023   Tobacco abuse 09/14/2023   Paroxysmal atrial fibrillation (HCC) 09/10/2023   Pressure injury of skin 09/10/2023   Malnutrition of moderate degree 09/07/2023   Acute metabolic encephalopathy 09/05/2023   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   Hypokalemia 12/03/2020   Abnormal LFTs (liver function tests)    Melena    Hyponatremia 01/11/2020   Alcohol abuse 01/11/2020    Orientation RESPIRATION BLADDER Height & Weight     Self, Time, Situation, Place  Normal Continent Weight: 78.1 kg Height:  6' (182.9 cm)  BEHAVIORAL SYMPTOMS/MOOD NEUROLOGICAL BOWEL NUTRITION STATUS      Continent Diet (Regular Diet, Fluid restriction )  AMBULATORY STATUS COMMUNICATION OF NEEDS Skin   Extensive Assist Verbally Bruising                       Personal Care Assistance Level of Assistance  Bathing, Feeding, Dressing Bathing Assistance: Maximum assistance Feeding assistance: Limited assistance Dressing Assistance: Maximum assistance      Functional Limitations Info    Sight Info: Impaired        SPECIAL CARE FACTORS FREQUENCY  PT (By licensed PT), OT (By licensed OT)     PT Frequency: 5 times per week OT Frequency: 5 times per week            Contractures Contractures Info: Not present    Additional Factors Info  Code Status, Allergies Code Status Info: Full Code Allergies Info: NKDA           Current Medications (09/20/2023):  This is the current hospital active medication list Current Facility-Administered Medications  Medication Dose Route Frequency Provider Last Rate Last Admin   albuterol  (PROVENTIL ) (2.5 MG/3ML) 0.083% nebulizer solution 2.5 mg  2.5 mg Inhalation Q4H PRN Niu, Xilin, MD   2.5 mg at 09/15/23 1606   ascorbic acid  (VITAMIN C ) tablet 500 mg  500 mg Oral BID Niu, Xilin, MD   500 mg at 09/19/23 2130   dextromethorphan -guaiFENesin  (MUCINEX  DM) 30-600 MG per 12 hr tablet 1 tablet  1 tablet Oral BID PRN Niu, Xilin, MD       enoxaparin  (LOVENOX ) injection 40 mg  40 mg Subcutaneous Q24H Niu, Xilin, MD   40 mg at 09/19/23 2140   feeding supplement (ENSURE PLUS HIGH PROTEIN) liquid 237 mL  237 mL Oral BID BM Shah, Pratik D, DO   237 mL at 09/19/23 1439   folic acid  (FOLVITE ) tablet 1 mg  1 mg Oral Daily Niu, Xilin, MD   1 mg at 09/19/23 9171   furosemide  (LASIX ) injection 40 mg  40 mg Intravenous Daily Amin, Sumayya, MD       hydrALAZINE (APRESOLINE) injection 5 mg  5 mg Intravenous Q2H PRN Niu, Xilin, MD       ibuprofen  (ADVIL ) tablet 200 mg  200 mg Oral Q6H PRN Niu, Xilin, MD   200 mg at 09/19/23 1801   lidocaine  (LIDODERM ) 5 % 1 patch  1 patch Transdermal Q24H Jesus America, NP   1 patch at 09/18/23 0635   metoprolol  tartrate (LOPRESSOR ) tablet 25 mg  25 mg Oral BID Niu, Xilin, MD   25 mg at 09/19/23 2130   multivitamin with minerals tablet 1 tablet  1 tablet Oral Daily Niu, Xilin, MD   1 tablet at 09/19/23 9171   nicotine  (NICODERM CQ  - dosed in mg/24 hours) patch 21 mg  21 mg Transdermal  Daily Niu, Xilin, MD       nystatin  (MYCOSTATIN /NYSTOP ) topical powder 1 Application  1 Application Topical BID Niu, Xilin, MD   1 Application at 09/19/23 2140   ondansetron  (ZOFRAN ) injection 4 mg  4 mg Intravenous Q8H PRN Niu, Xilin, MD       pantoprazole  (PROTONIX ) EC tablet 40 mg  40 mg Oral Daily Niu, Xilin, MD   40 mg at 09/19/23 9171   polyethylene glycol (MIRALAX  / GLYCOLAX ) packet 17 g  17 g Oral Daily Vann, Jessica U, DO   17 g at 09/19/23 9170   senna-docusate (Senokot-S) tablet 1 tablet  1 tablet Oral QHS PRN Vann, Jessica U, DO       sodium chloride  tablet 1 g  1 g Oral TID WC Shah, Pratik D, DO   1 g at 09/19/23 1801   thiamine  (VITAMIN B1) tablet 100 mg  100 mg Oral Daily Niu, Xilin, MD   100 mg at 09/19/23 9171     Discharge Medications: Please see discharge summary for a list of discharge medications.  Relevant Imaging Results:  Relevant Lab Results:   Additional Information [id number]  Quintella Suzen Jansky, RN

## 2023-09-20 NOTE — Plan of Care (Signed)
   Problem: Education: Goal: Knowledge of General Education information will improve Description Including pain rating scale, medication(s)/side effects and non-pharmacologic comfort measures Outcome: Progressing

## 2023-09-20 NOTE — Assessment & Plan Note (Signed)
 Initial concern of HCAP as he was admitted recently with concern of pneumonia and completed a course of antibiotic. MRSA PCR negative.  Initially started on cefepime  and vancomycin  and continued on cefepime  only.  Blood cultures negative.  Urine strep pneumo and Legionella antigen negative. CT chest with contrast today with bilateral pleural effusion and adjacent lung opacities which need further investigation.  Procalcitonin negative -Stopping antibiotics - Continue with supportive care

## 2023-09-20 NOTE — Progress Notes (Signed)
 Physical Therapy Treatment Patient Details Name: Barry Fowler MRN: [id number] DOB: April 29, 1955 Today's Date: 09/20/2023   History of Present Illness Pt is a 68 year old male presents with SOB, admitted with acute hypoxemic respiratory failure secondary to suspected HCAP    PMH significant for tobacco abuse, alcohol abuse, hypertension, chronic hyponatremia    PT Comments  Patient is agreeable to PT session. He continues to require assistance with mobility. Mod A required for standing with cues for anterior weight shifting and hand placement. Standing tolerance limited by fatigue as well as urgency for urination. Recommend to continue PT to maximize independence and decrease caregiver burden. Anticipate patient will need rehabilitation < 3 hours/day after this hospital stay.     If plan is discharge home, recommend the following: Two people to help with walking and/or transfers;Two people to help with bathing/dressing/bathroom;Help with stairs or ramp for entrance;Assist for transportation;Assistance with cooking/housework;Supervision due to cognitive status   Can travel by private vehicle     No  Equipment Recommendations  Other (comment) (to be determined)    Recommendations for Other Services       Precautions / Restrictions Precautions Precautions: Fall Recall of Precautions/Restrictions: Impaired Restrictions Weight Bearing Restrictions Per Provider Order: No     Mobility  Bed Mobility Overal bed mobility: Needs Assistance Bed Mobility: Supine to Sit, Sit to Supine     Supine to sit: Min assist Sit to supine: Supervision   General bed mobility comments: assistance for LE support to get to edge of bed. cues for task initiation    Transfers Overall transfer level: Needs assistance Equipment used: Rolling walker (2 wheels) Transfers: Sit to/from Stand Sit to Stand: Mod assist           General transfer comment: cues for hand placement. posterior lean with faciliation  for anterior weight shifting    Ambulation/Gait               General Gait Details: not attempted due to urgency for urination. standing activity tolerance limited by fatigue   Stairs             Wheelchair Mobility     Tilt Bed    Modified Rankin (Stroke Patients Only)       Balance Overall balance assessment: Needs assistance Sitting-balance support: No upper extremity supported, Feet supported Sitting balance-Leahy Scale: Fair     Standing balance support: Bilateral upper extremity supported, Reliant on assistive device for balance, During functional activity Standing balance-Leahy Scale: Poor Standing balance comment: external support required to maintain standing balance initially with heavy reliance on rolling walker for support                            Communication Communication Communication: No apparent difficulties  Cognition Arousal: Alert Behavior During Therapy: Flat affect   PT - Cognitive impairments: No family/caregiver present to determine baseline, Difficult to assess, Initiation, Sequencing                       PT - Cognition Comments: increased time for processing. cooperative throughout Following commands: Intact Following commands impaired: Follows one step commands with increased time    Cueing Cueing Techniques: Verbal cues  Exercises      General Comments        Pertinent Vitals/Pain Pain Assessment Pain Assessment: No/denies pain    Home Living  Prior Function            PT Goals (current goals can now be found in the care plan section) Acute Rehab PT Goals Patient Stated Goal: to get his strength back PT Goal Formulation: With patient Time For Goal Achievement: 10/01/23 Potential to Achieve Goals: Good Progress towards PT goals: Progressing toward goals    Frequency    Min 2X/week      PT Plan      Co-evaluation              AM-PAC  PT 6 Clicks Mobility   Outcome Measure  Help needed turning from your back to your side while in a flat bed without using bedrails?: A Little Help needed moving from lying on your back to sitting on the side of a flat bed without using bedrails?: A Little Help needed moving to and from a bed to a chair (including a wheelchair)?: A Lot Help needed standing up from a chair using your arms (e.g., wheelchair or bedside chair)?: A Lot Help needed to walk in hospital room?: A Lot Help needed climbing 3-5 steps with a railing? : Total 6 Click Score: 13    End of Session Equipment Utilized During Treatment: Oxygen Activity Tolerance: Patient limited by fatigue Patient left: in bed;with call bell/phone within reach;with bed alarm set Nurse Communication: Mobility status PT Visit Diagnosis: Other abnormalities of gait and mobility (R26.89);Difficulty in walking, not elsewhere classified (R26.2);Muscle weakness (generalized) (M62.81)     Time: 8489-8478 PT Time Calculation (min) (ACUTE ONLY): 11 min  Charges:    $Therapeutic Activity: 8-22 mins PT General Charges $$ ACUTE PT VISIT: 1 Visit                     Randine Essex, PT, MPT    Randine LULLA Essex 09/20/2023, 3:26 PM

## 2023-09-21 ENCOUNTER — Inpatient Hospital Stay

## 2023-09-21 DIAGNOSIS — J90 Pleural effusion, not elsewhere classified: Secondary | ICD-10-CM | POA: Diagnosis not present

## 2023-09-21 DIAGNOSIS — Z9889 Other specified postprocedural states: Secondary | ICD-10-CM

## 2023-09-21 DIAGNOSIS — R0602 Shortness of breath: Secondary | ICD-10-CM

## 2023-09-21 DIAGNOSIS — E8770 Fluid overload, unspecified: Secondary | ICD-10-CM

## 2023-09-21 DIAGNOSIS — J189 Pneumonia, unspecified organism: Secondary | ICD-10-CM | POA: Diagnosis not present

## 2023-09-21 DIAGNOSIS — R0902 Hypoxemia: Principal | ICD-10-CM

## 2023-09-21 LAB — BASIC METABOLIC PANEL WITH GFR
Anion gap: 11 (ref 5–15)
BUN: 13 mg/dL (ref 8–23)
CO2: 25 mmol/L (ref 22–32)
Calcium: 8.8 mg/dL — ABNORMAL LOW (ref 8.9–10.3)
Chloride: 91 mmol/L — ABNORMAL LOW (ref 98–111)
Creatinine, Ser: 0.48 mg/dL — ABNORMAL LOW (ref 0.61–1.24)
GFR, Estimated: >60 mL/min (ref >60)
Glucose, Bld: 118 mg/dL — ABNORMAL HIGH (ref 70–99)
Potassium: 3.8 mmol/L (ref 3.5–5.1)
Sodium: 127 mmol/L — ABNORMAL LOW (ref 135–145)

## 2023-09-21 MED ORDER — LIDOCAINE HCL (PF) 1 % IJ SOLN
10.0000 mL | Freq: Once | INTRAMUSCULAR | Status: AC
  Administered 2023-09-21: 10 mL via INTRADERMAL
  Filled 2023-09-21: qty 10

## 2023-09-21 MED ORDER — COLCHICINE 0.6 MG PO TABS
0.6000 mg | ORAL_TABLET | Freq: Two times a day (BID) | ORAL | Status: DC
  Administered 2023-09-21 – 2023-09-22 (×3): 0.6 mg via ORAL
  Filled 2023-09-21 (×5): qty 1

## 2023-09-21 MED ORDER — IOHEXOL 300 MG/ML  SOLN
75.0000 mL | Freq: Once | INTRAMUSCULAR | Status: AC | PRN
  Administered 2023-09-21: 75 mL via INTRAVENOUS

## 2023-09-21 NOTE — Progress Notes (Signed)
 Rounding Note   Patient Name: Barry Fowler Date of Encounter: 09/21/2023  [facility] Cardiologist: [facility]-Dr. End  Subjective Family at the bedside, he is sitting up, well-groomed, had breakfast, drinking coffee - Overall no complaints - Long discussion concerning alcohol abuse, underlying medical issues including chronic hyponatremia, recurrent falls, pleural effusion, pericardial effusion, general nutrition - Echocardiogram results reviewed with him showing moderate pericardial effusion with no tamponade - Elevated CRP, sed rate, started on colchicine - Telemetry reviewed, maintaining normal sinus rhythm, no clear findings of atrial fibrillation on EKG or telemetry  Scheduled Meds:  ascorbic acid   500 mg Oral BID   colchicine  0.6 mg Oral BID   enoxaparin  (LOVENOX ) injection  40 mg Subcutaneous Q24H   feeding supplement  237 mL Oral BID BM   folic acid   1 mg Oral Daily   furosemide   40 mg Intravenous BID   lidocaine   1 patch Transdermal Q24H   metoprolol  tartrate  25 mg Oral BID   multivitamin with minerals  1 tablet Oral Daily   nicotine   21 mg Transdermal Daily   nystatin   1 Application Topical BID   pantoprazole   40 mg Oral Daily   polyethylene glycol  17 g Oral Daily   sodium chloride   1 g Oral TID WC   thiamine   100 mg Oral Daily   Continuous Infusions:  PRN Meds: albuterol , dextromethorphan -guaiFENesin , hydrALAZINE, ibuprofen , ondansetron  (ZOFRAN ) IV, senna-docusate   Vital Signs  Vitals:   09/20/23 1953 09/21/23 0250 09/21/23 0500 09/21/23 0735  BP: 118/75 105/70  120/75  Pulse: 73 70  69  Resp: 18 18  16   Temp: 98.3 F (36.8 C) 97.9 F (36.6 C)  (!) 97.5 F (36.4 C)  TempSrc:  Oral  Oral  SpO2: 93% 93%  92%  Weight:   75.8 kg   Height:        Intake/Output Summary (Last 24 hours) at 09/21/2023 1128 Last data filed at 09/21/2023 0933 Gross per 24 hour  Intake 240 ml  Output 2100 ml  Net -1860 ml      09/21/2023    5:00 AM 09/20/2023     5:00 AM 09/19/2023    5:50 AM  Last 3 Weights  Weight (lbs) 167 lb 1.7 oz 172 lb 2.9 oz 180 lb 12.4 oz  Weight (kg) 75.8 kg 78.1 kg 82 kg      Telemetry Normal sinus rhyth- Personally Reviewed  ECG   - Personally Reviewed  Physical Exam  GEN: No acute distress.   Neck: No JVD Cardiac: RRR, no murmurs, rubs, or gallops.  Respiratory: Clear to auscultation bilaterally. GI: Soft, nontender, non-distended  MS: No edema; No deformity. Neuro:  Nonfocal  Psych: Normal affect   Labs High Sensitivity Troponin:   Recent Labs  Lab 09/06/23 0347 09/06/23 0705 09/14/23 2055  TROPONINIHS 21* 15 17     Chemistry Recent Labs  Lab 09/16/23 0517 09/17/23 0442 09/17/23 0947 09/18/23 0540 09/18/23 1619 09/19/23 0147 09/21/23 0439  NA 126* 122*   < > 125* 128* 128* 127*  K 4.1 4.0   < > 3.3* 4.1 4.4 3.8  CL 93* 91*   < > 88* 89* 91* 91*  CO2 23 23   < > 27 27 27 25   GLUCOSE 125* 127*   < > 136* 131* 130* 118*  BUN 11 12   < > 9 15 15 13   CREATININE 0.51* 0.47*   < > 0.42* 0.57* 0.51* 0.48*  CALCIUM  8.0* 8.0*   < >  8.3* 8.4* 8.8* 8.8*  MG 2.1 1.9  --  1.6*  --   --   --   GFRNONAA >60 >60   < > >60 >60 >60 >60  ANIONGAP 10 8   < > 10 12 10 11    < > = values in this interval not displayed.    Lipids No results for input(s): CHOL, TRIG, HDL, LABVLDL, LDLCALC, CHOLHDL in the last 168 hours.  Hematology Recent Labs  Lab 09/17/23 0442 09/18/23 0540 09/19/23 0147  WBC 14.2* 10.6* 9.0  RBC 3.46* 3.31* 3.13*  HGB 10.6* 10.2* 9.5*  HCT 30.8* 28.8* 28.0*  MCV 89.0 87.0 89.5  MCH 30.6 30.8 30.4  MCHC 34.4 35.4 33.9  RDW 14.6 14.3 14.4  PLT 371 390 380   Thyroid No results for input(s): TSH, FREET4 in the last 168 hours.  BNP Recent Labs  Lab 09/14/23 2055 09/17/23 0442 09/19/23 0147  BNP 412.4* 207.4* 202.9*    DDimer No results for input(s): DDIMER in the last 168 hours.   Radiology  ECHOCARDIOGRAM LIMITED Result Date: 09/20/2023     ECHOCARDIOGRAM LIMITED REPORT   Patient Name:   Barry Fowler Date of Exam: 09/20/2023 Medical Rec #:  [id number]   Height:       72.0 in Accession #:    [id number]  Weight:       172.2 lb Date of Birth:  1955-04-02   BSA:          1.999 m Patient Age:    67 years    BP:           140/86 mmHg Patient Gender: M           HR:           77 bpm. Exam Location:  [facility] Procedure: Limited Echo, Color Doppler and Cardiac Doppler (Both Spectral and            Color Flow Doppler were utilized during procedure). Indications:     Pericardial Effusion I31.3  History:         Patient has prior history of Echocardiogram examinations, most                  recent 09/12/2023. Risk Factors:Hypertension.  Sonographer:     Christopher Furnace Referring Phys:  [id number] FLYJFFJI A ARIDA Diagnosing Phys: Lonni Hanson MD IMPRESSIONS  1. Left ventricular ejection fraction, by estimation, is 60 to 65%. The left ventricle has normal function. There is mild left ventricular hypertrophy.  2. Right ventricular systolic function is low normal. The right ventricular size is normal. There is normal pulmonary artery systolic pressure.  3. Right atrial size was mildly dilated.  4. Moderate pericardial effusion. The pericardial effusion is posterior to the left ventricle and lateral to the left ventricle. There is no evidence of cardiac tamponade.  5. The aortic valve is tricuspid.  6. The inferior vena cava is dilated in size with >50% respiratory variability, suggesting right atrial pressure of 8 mmHg. Comparison(s): A prior study was performed on 09/12/2023. Pericardial effusion appears similar in size but is less circumferential and more localized to the posterolateral left ventricle. FINDINGS  Left Ventricle: Left ventricular ejection fraction, by estimation, is 60 to 65%. The left ventricle has normal function. The left ventricular internal cavity size was normal in size. There is mild left ventricular hypertrophy. Right Ventricle: The right ventricular size is  normal. Right ventricular systolic function is low normal. There is normal pulmonary artery systolic pressure.  The tricuspid regurgitant velocity is 1.62 m/s, and with an assumed right atrial pressure of 8 mmHg, the estimated right ventricular systolic pressure is 18.5 mmHg. Left Atrium: Left atrial size was normal in size. Right Atrium: Right atrial size was mildly dilated. Pericardium: A moderately sized pericardial effusion is present. The pericardial effusion is posterior to the left ventricle and lateral to the left ventricle. There is no evidence of cardiac tamponade. Tricuspid Valve: The tricuspid valve is not well visualized. Aortic Valve: The aortic valve is tricuspid. Pulmonic Valve: The pulmonic valve was not well visualized. Pulmonic valve regurgitation is not visualized. No evidence of pulmonic stenosis. Aorta: The aortic root is normal in size and structure. Venous: The inferior vena cava is dilated in size with greater than 50% respiratory variability, suggesting right atrial pressure of 8 mmHg. Additional Comments: There is a small pleural effusion in the left lateral region. Spectral Doppler performed. Color Doppler performed.  LEFT VENTRICLE PLAX 2D LVIDd:         3.70 cm LVIDs:         2.70 cm LV PW:         1.20 cm LV IVS:        1.20 cm LVOT diam:     2.00 cm LVOT Area:     3.14 cm  RIGHT VENTRICLE RV Basal diam:  4.00 cm RV Mid diam:    3.40 cm LEFT ATRIUM           Index        RIGHT ATRIUM           Index LA diam:      3.10 cm 1.55 cm/m   RA Area:     23.40 cm LA Vol (A2C): 46.7 ml 23.36 ml/m  RA Volume:   75.00 ml  37.52 ml/m LA Vol (A4C): 16.3 ml 8.15 ml/m   AORTA Ao Root diam: 3.50 cm TRICUSPID VALVE TR Peak grad:   10.5 mmHg TR Vmax:        162.00 cm/s  SHUNTS Systemic Diam: 2.00 cm Lonni Hanson MD Electronically signed by Lonni Hanson MD Signature Date/Time: 09/20/2023/3:27:51 PM    Final    US  THORACENTESIS ASP PLEURAL SPACE W/IMG GUIDE Addendum Date: 09/20/2023 ADDENDUM  REPORT: 09/20/2023 13:19 ADDENDUM: This was a LEFT sided thoracentesis yielding of amber fluid. Electronically Signed   By: Cordella Banner   On: 09/20/2023 13:19   Result Date: 09/20/2023 INDICATION: 68 year old male who presented to the ED with shortness of breath. Imaging concerning for pulmonary edema and possible pneumonia. Request for diagnostic and therapeutic thoracentesis. EXAM: ULTRASOUND GUIDED right THORACENTESIS MEDICATIONS: 1% lidocaine , 8 mL. COMPLICATIONS: None immediate. PROCEDURE: An ultrasound guided thoracentesis was thoroughly discussed with the patient and questions answered. The benefits, risks, alternatives and complications were also discussed. The patient understands and wishes to proceed with the procedure. Written consent was obtained. Ultrasound was performed to localize and mark an adequate pocket of fluid in the right chest. The area was then prepped and draped in the normal sterile fashion. 1% Lidocaine  was used for local anesthesia. Under ultrasound guidance a 6 Fr Safe-T-Centesis catheter was introduced. Thoracentesis was performed. The catheter was removed and a dressing applied. FINDINGS: A total of approximately 900 mL of amber fluid was removed. Samples were sent to the laboratory as requested by the clinical team. IMPRESSION: Successful ultrasound guided right thoracentesis yielding 900 mL of pleural fluid. Procedure performed by: Sherrilee Bal, PA-C under the supervision of Dr. KANDICE  Jenna Electronically Signed: By: Cordella Jenna On: 09/20/2023 07:57   DG Chest Port 1 View Result Date: 09/19/2023 CLINICAL DATA:  Left pleural effusion status post thoracentesis. EXAM: PORTABLE CHEST 1 VIEW COMPARISON:  Chest x-ray 09/17/2023 FINDINGS: Small left pleural effusion is present, significantly decreased. There is no pneumothorax. Small right pleural effusion has also decreased. There are patchy opacities in the right lung base. The heart is enlarged, unchanged. No  acute fractures are seen. IMPRESSION: 1. Small left pleural effusion, significantly decreased. No pneumothorax. 2. Small right pleural effusion, decreased. 3. Patchy opacities in the right lung base, likely atelectasis. Electronically Signed   By: Greig Pique M.D.   On: 09/19/2023 16:58    Cardiac Studies Echo 1. Left ventricular ejection fraction, by estimation, is 60 to 65%. The  left ventricle has normal function. There is mild left ventricular  hypertrophy.   2. Right ventricular systolic function is low normal. The right  ventricular size is normal. There is normal pulmonary artery systolic  pressure.   3. Right atrial size was mildly dilated.   4. Moderate pericardial effusion. The pericardial effusion is posterior  to the left ventricle and lateral to the left ventricle. There is no  evidence of cardiac tamponade.   5. The aortic valve is tricuspid.   6. The inferior vena cava is dilated in size with >50% respiratory  variability, suggesting right atrial pressure of 8 mmHg.   Patient Profile   Barry Fowler is a 68 y.o. male with a hx of moderate pericardial effusion by recent echoes, paroxysmal atrial fibrillation, hypertension, alcohol abuse complicated by chronic hyponatremia and recurrent falls who is being seen 09/20/2023 for the evaluation of pericardial effusion   Assessment & Plan  Pericardial effusion Moderate on echocardiogram No tamponade noted, asymptomatic this morning Not a good candidate for pericardiocentesis given location of pericardial effusion, risk outweighs benefit Elevated CRP, sed rate of unclear significance -Would continue IV Lasix , colchicine - Will plan for outpatient follow-up echocardiogram in several weeks time for further evaluation of pericardial effusion - We have stressed the importance of alcohol cessation, better nutrition  Pleural effusion Albumin  less than 3, alcohol abuse, hyponatremia, -Completed thoracentesis yesterday with improved  symptoms - Would continue IV Lasix , if no improvement in effusion on right, may need repeat thoracentesis but currently appears comfortable.  Given poor nutrition and alcohol, high risk of recurrence  Arrhythmia No significant atrial fibrillation or other arrhythmia on telemetry EKG showing normal sinus rhythm No indication for anticoagulation especially in light of syncope, falls, alcohol abuse -Continue metoprolol  tartrate 25 twice daily  Hyponatremia In the setting of alcohol abuse Chronic issue, alcohol cessation recommended   For questions or updates, please contact [facility] Please consult [email or web address] for contact info under     Signed, Farley Crooker, MD  09/21/2023, 11:28 AM

## 2023-09-21 NOTE — Assessment & Plan Note (Signed)
 Currently in sinus rhythm.  Paroxysmal A-fib with RVR. Not on any anticoagulation at this time. - Continue with metoprolol 

## 2023-09-21 NOTE — Procedures (Signed)
 PROCEDURE SUMMARY:  Successful image-guided left thoracentesis. Yielded 0.30 liters of clear, straw-colored pleural fluid. Patient tolerated procedure well. EBL: Zero No immediate complications.  Post procedure CXR shows no pneumothorax.  Please see imaging section of Epic for full dictation.  [facility] Kashay Cavenaugh PA-C 09/21/2023 11:51 AM

## 2023-09-21 NOTE — Progress Notes (Signed)
 Progress Note   Patient: Barry Fowler FMW:[id number] DOB: September 16, 1955 DOA: 09/14/2023     7 DOS: the patient was seen and examined on 09/21/2023   Brief hospital course: Taken from prior notes.  Barry Fowler is a 68 y.o. male with medical history significant of tobacco abuse, alcohol abuse, hypertension, chronic hyponatremia, who presents with SOB.   Patient was recently hospitalized from 6/9 - 6/17 due to severe hyponatremia with sodium 104 which improved to 124 at discharge; also had new onset A-fib, started on metoprolol  not anticoagulant since patient is a poor candidate for anticoagulant use.  Patient also had possible pneumonia which was treated with Rocephin  and doxycycline  in hospital.   Patient was readmitted 2 days later with worsening dyspnea and continue to have cough with little mucus production.  He was found to be hypoxic up to 80% on room air, initially started on nonrebreather and later switched to 5 L of oxygen.  No baseline oxygen use.  Labs with concern of hyponatremia.  Chest x-ray with concern of pulmonary edema and bilateral lower lobe infiltrates.  BNP was elevated at 412.  Patient was started on broad-spectrum antibiotics for concern of HCAP.  Also received Lasix  infusion for a day with IV albumin  due to softer blood pressure to help with diuresis, due to decrease in UOP on 6/22 Lasix  infusion was held.  Clinically appears euvolemic.  BNP on repeat was 202.  Patient had an echo done on 09/12/2023 which shows normal EF and moderate pericardial effusion, no sign of cardiac tamponade.  IVC was dilated with less than 50% respiratory variability and a mild pulmonary hypertension.  6/23: Vital stable with borderline soft blood pressure and patient continued to require 3 to 4 L of oxygen, continue to have cough and worsening dyspnea.  Sodium at 128.  Leukocytosis has been resolved.  BNP 202 today.  CT chest with contrast was obtained which shows a large pericardial effusion with some  pericardial thickening.  Moderate pleural effusions with adjacent lung opacities, left greater than right.  Also has few enlarged and prominent mediastinal nodes which could be reactive but need additional workup like PET scan to rule out any underlying malignancy.  Slight nodular contour of the liver.  Thoracentesis with lab was also ordered.  Cardiology and pulmonary was consulted.  Procalcitonin on the day of this readmission was negative.  Repeating procalcitonin and if remain negative we will discontinue antibiotics.  6/24: Vital stable, procalcitonin remained negative so discontinuing antibiotics, hypoosmolar hyponatremia.  Urine osmolality and sodium elevated as compared to the prior check 2 weeks ago.-Patient is on salt tablets.  S/p thoracentesis with removal of 900 mL of amber-colored pleural fluid.  Labs with  elevated LDH at 116, and protein of 3.5, makes a transudative fluid.  Cultures pending Repeat limited echocardiogram pending.  Starting on Lasix  40 mg daily. Pulmonary is recommending monitoring bilateral pleural effusions likely will need more drainage from left.  Patient will need outpatient PET scan and EBUS if needed to rule out any underlying malignancy.  6/25: Vital stable, labs with slight worsening of hyponatremia with sodium at 127, elevated inflammatory markers with CRP 9.6 and ESR 70-starting on colchicine as patient has both pleural and pericardial effusions.  Repeat limited echocardiogram with moderate pericardial effusion mostly posterolateral to left ventricle, no sign of tamponade.  Not amenable to pericardiocentesis at this time per cardiology. Continuing diuresis.  Patient underwent left thoracentesis today   Assessment and Plan: * Acute respiratory failure with hypoxia (HCC)  Patient with no baseline oxygen use.  Initially required nonrebreather, currently on 3 L of oxygen.  Initial concern of HCAP but procalcitonin was negative.  Chest x-ray with concern of increased  interstitial markings. CT chest  with bilateral pleural effusions and large pericardial effusion. - Continue with supplemental oxygen -Wean as tolerated  Pneumonia due to infectious organism Initial concern of HCAP as he was admitted recently with concern of pneumonia and completed a course of antibiotic. MRSA PCR negative.  Initially started on cefepime  and vancomycin  and continued on cefepime  only.  Blood cultures negative.  Urine strep pneumo and Legionella antigen negative. CT chest with contrast today with bilateral pleural effusion and adjacent lung opacities which need further investigation.  Procalcitonin negative -Stopping antibiotics - Continue with supportive care  Pericardial effusion Echocardiogram done on 6/16 with moderate circumferential pericardial effusion.  CT chest today with concern of large pericardial effusions. Patient did receive Lasix  infusion initially which was later discontinued due to decrease in UOP.  .  Renal function seems stable.  Elevated inflammatory markers Repeat limited echo with moderate pericardial effusion, located posterolaterally to left ventricle, not amenable for paracentesis, no sign of tamponade. - Cardiology is on board -Started on colchicine -Continue with IV Lasix  40 mg twice daily -Daily weight and BMP -Strict intake and output  Pleural effusion, left CT chest today with bilateral pleural effusions left greater than right.  S/p ultrasound-guided left-sided thoracentesis, elevated LDH and protein which makes it transudative fluid.  Procalcitonin negative. Pulmonary was consulted-recommending close monitoring of pleural effusion as he will likely need more thoracentesis. Patient underwent another left thoracentesis today - Preliminary cultures with no organism - Cytology pending - Patient likely need outpatient PET scan and EBUS to rule out any underlying malignancy.   Hyponatremia Seems like having chronic hyponatremia.  Has an  history of alcohol abuse.  Sodium at 127 today and patient is on salt tablets.  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  Labs with hypotonic hyponatremia  -Continue with salt tablets -Monitor sodium  Paroxysmal atrial fibrillation (HCC) Currently in sinus rhythm.  Paroxysmal A-fib with RVR. Not on any anticoagulation at this time. - Continue with metoprolol   HTN (hypertension) Blood pressure borderline soft. - Continuing metoprolol  -Holding home antihypertensives  Alcohol abuse Patient did not drink alcohol since discharge to SNF after prior hospitalization. -Continue thiamine  and folic acid   Tobacco abuse Counseling was provided -Nicotine  patch as needed      Subjective: Patient was seen and examined today.  Shortness of breath continue to improve specially after second thoracentesis.  No new concern.  Physical Exam: Vitals:   09/21/23 0735 09/21/23 1137 09/21/23 1147 09/21/23 1547  BP: 120/75 (!) 101/55 (!) 96/58 113/64  Pulse: 69 75  76  Resp: 16 16  16   Temp: (!) 97.5 F (36.4 C)   98.6 F (37 C)  TempSrc: Oral   Oral  SpO2: 92% 98%  95%  Weight:      Height:       General.  Frail gentleman, in no acute distress. Pulmonary.  Lungs clear bilaterally, normal respiratory effort. CV.  Regular rate and rhythm, no JVD, rub or murmur. Abdomen.  Soft, nontender, nondistended, BS positive. CNS.  Alert and oriented .  No focal neurologic deficit. Extremities.  No edema, pulses intact and symmetrical. Psychiatry.  Judgment and insight appears normal.    Data Reviewed: Prior data reviewed.  Family Communication: Discussed with patient.  Disposition: Status is: Inpatient Remains inpatient appropriate because:  Severity of illness.  Planned Discharge Destination: Skilled nursing facility  DVT prophylaxis.  Lovenox  Time spent: 50 minutes  This record has been created using Conservation officer, historic buildings. Errors have been sought and corrected,but may not  always be located. Such creation errors do not reflect on the standard of care.   Author: Amaryllis Dare, MD 09/21/2023 4:00 PM  For on call review [email or web address].

## 2023-09-21 NOTE — Progress Notes (Signed)
 Physical Therapy Treatment Patient Details Name: Barry Fowler MRN: [id number] DOB: Jul 19, 1955 Today's Date: 09/21/2023   History of Present Illness Pt is a 68 year old male presents with SOB, admitted with acute hypoxemic respiratory failure secondary to suspected HCAP    PMH significant for tobacco abuse, alcohol abuse, hypertension, chronic hyponatremia    PT Comments  Patient is agreeable to PT session. Increased ambulation distance this session. Patient continues to require assistance for standing and walking using rolling walker. Activity tolerance limited by fatigue. Sp02 99-100% on 3 L02 and heart rate in the 70's-80's after mobility. Recommend to continue PT to maximize independence and facilitate return to prior level of function.    If plan is discharge home, recommend the following: Two people to help with walking and/or transfers;Two people to help with bathing/dressing/bathroom;Help with stairs or ramp for entrance;Assist for transportation;Assistance with cooking/housework;Supervision due to cognitive status   Can travel by private vehicle     No  Equipment Recommendations  None recommended by PT    Recommendations for Other Services       Precautions / Restrictions Precautions Precautions: Fall Recall of Precautions/Restrictions: Impaired Restrictions Weight Bearing Restrictions Per Provider Order: No     Mobility  Bed Mobility Overal bed mobility: Needs Assistance Bed Mobility: Supine to Sit, Sit to Supine     Supine to sit: Contact guard, HOB elevated Sit to supine: Contact guard assist, HOB elevated   General bed mobility comments: increased time and effort required    Transfers Overall transfer level: Needs assistance Equipment used: Rolling walker (2 wheels) Transfers: Sit to/from Stand Sit to Stand: Min assist           General transfer comment: steadying assistance provided. cues for hand placement for safety     Ambulation/Gait Ambulation/Gait assistance: Min assist Gait Distance (Feet): 20 Feet Assistive device: Rolling walker (2 wheels) Gait Pattern/deviations: Ataxic, Wide base of support Gait velocity: decreased     General Gait Details: reinfrocement to use rolling walker for support for ambulation for safety and fall prevention. one standing rest break with mild dyspena with exertion. Sp02 99-100% on 3 L02 after walking, heart rate in the 70's-80's   Stairs             Wheelchair Mobility     Tilt Bed    Modified Rankin (Stroke Patients Only)       Balance Overall balance assessment: Needs assistance Sitting-balance support: No upper extremity supported, Feet supported Sitting balance-Leahy Scale: Fair     Standing balance support: Bilateral upper extremity supported, Reliant on assistive device for balance, During functional activity Standing balance-Leahy Scale: Poor Standing balance comment: heavy reliance on rolling walker for support                            Communication Communication Communication: No apparent difficulties  Cognition Arousal: Alert Behavior During Therapy: Flat affect   PT - Cognitive impairments: No family/caregiver present to determine baseline, Initiation, Sequencing                         Following commands: Intact Following commands impaired: Follows one step commands with increased time    Cueing Cueing Techniques: Verbal cues  Exercises      General Comments General comments (skin integrity, edema, etc.): activity tolerance limited by fatigue      Pertinent Vitals/Pain Pain Assessment Pain Assessment: No/denies pain  Home Living                          Prior Function            PT Goals (current goals can now be found in the care plan section) Acute Rehab PT Goals Patient Stated Goal: to get his strength back PT Goal Formulation: With patient Time For Goal Achievement:  10/01/23 Potential to Achieve Goals: Good Progress towards PT goals: Progressing toward goals    Frequency    Min 2X/week      PT Plan      Co-evaluation              AM-PAC PT 6 Clicks Mobility   Outcome Measure  Help needed turning from your back to your side while in a flat bed without using bedrails?: A Little Help needed moving from lying on your back to sitting on the side of a flat bed without using bedrails?: A Little Help needed moving to and from a bed to a chair (including a wheelchair)?: A Lot Help needed standing up from a chair using your arms (e.g., wheelchair or bedside chair)?: A Lot Help needed to walk in hospital room?: A Lot Help needed climbing 3-5 steps with a railing? : Total 6 Click Score: 13    End of Session Equipment Utilized During Treatment: Oxygen Activity Tolerance: Patient limited by fatigue Patient left: in bed;with call bell/phone within reach;with bed alarm set Nurse Communication: Mobility status PT Visit Diagnosis: Other abnormalities of gait and mobility (R26.89);Difficulty in walking, not elsewhere classified (R26.2);Muscle weakness (generalized) (M62.81)     Time: 8650-8641 PT Time Calculation (min) (ACUTE ONLY): 9 min  Charges:    $Therapeutic Activity: 8-22 mins PT General Charges $$ ACUTE PT VISIT: 1 Visit                    Barry Fowler, PT, MPT    Barry Fowler 09/21/2023, 2:07 PM

## 2023-09-21 NOTE — Progress Notes (Signed)
 NAME:  Lynne Righi, MRN:  [id number], DOB:  12-06-1955, LOS: 7 ADMISSION DATE:  09/14/2023, CHIEF COMPLAINT:  Respiratory Failure   History of Present Illness:   Patient is a 68 year old male presenting to the hospital with hypoxic respiratory failure and increased shortness of breath. Pulmonary is consulted to comment on the pleural effusions and mediastinal lymphadenopathy.   Patient was recently admitted to the hospital 09/05/2023 to 09/13/2023 after a fall.  He was noted to have severe hyponatremia and his alcohol withdrawal was treated with phenobarbital .  With improvement in his sodium, he was discharged to rehab where he lasted 24 hours prior to representing with acute hypoxic respiratory failure.   Patient reports that his symptoms were acute in onset with sudden worsening of his shortness of breath while at rehab.  There, he had escalating oxygen requirements to 15 L via nasal cannula prompting EMS activation and representation back to the hospital.  He does not have any fevers, chills, chest pain, night sweats, or signs of systemic illness.  He does not report any weight loss.   On admission to the hospital on 09/14/2023, he was noted to have bilateral pleural effusions as well as a pericardial effusion.  Review of the record notes moderately sized pericardial effusion on TTE from 09/12/2023 which was managed conservatively given no signs of tamponade.     A dedicated CT scan of the chest was performed confirming the bilateral effusions, bilateral lower lobe atelectasis, hilar and mediastinal lymphadenopathy, and a pericardial effusion. With the findings of the bilateral pleural effusions, IR was consulted and he underwent left-sided thoracentesis yesterday. Patient was also treated with a course of antibiotics during his hospitalization, as well as metoprolol  for afib rate control. Strep and Legionella Urine antigens were negative, and procalcitonin was similarly negative.  With the  thoracentesis, he felt much better with less labored breathing.  Pertinent  Medical History  -Afib -EtOH Use -Pericardial effusion -Recurrent falls -Tobacco use disroder  Significant Hospital Events: Including procedures, antibiotic start and stop dates in addition to other pertinent events   09/19/23: Left sided thoracentesis 09/20/23: improved shortness of breath, residual bilateral effusions, TTE with pericardial effusion. IV diuresis administered  Interim History / Subjective:  Breathing remains at baseline, no increased shortness of breath.  Objective    Blood pressure 120/75, pulse 69, temperature (!) 97.5 F (36.4 C), temperature source Oral, resp. rate 16, height 6' (1.829 m), weight 75.8 kg, SpO2 92%.        Intake/Output Summary (Last 24 hours) at 09/21/2023 0747 Last data filed at 09/21/2023 0600 Gross per 24 hour  Intake 700 ml  Output 1800 ml  Net -1100 ml   Filed Weights   09/19/23 0550 09/20/23 0500 09/21/23 0500  Weight: 82 kg 78.1 kg 75.8 kg    Examination:  Physical Exam Constitutional:      Appearance: Normal appearance.   Cardiovascular:     Rate and Rhythm: Normal rate and regular rhythm.     Pulses: Normal pulses.     Heart sounds: Normal heart sounds.  Pulmonary:     Effort: Pulmonary effort is normal.     Breath sounds: No wheezing.   Neurological:     General: No focal deficit present.     Mental Status: He is alert. Mental status is at baseline.    POCUS of the Right Pleural Space    POCUS of the Left Pleural Space    Assessment and Plan   #Acute Hypoxic Respiratory Failure #  Bilateral Pleural Effusion #Mediastinal Lymphadenopathy #Pericardial Effusion #Acute Decompensated HFpEF #Alcohol Use Disorder   Patient is a 68 year old male with a past medical history of alcohol use disorder as well as a pericardial effusion who presented to the hospital with acute hypoxic respiratory failure secondary to bilateral pleural effusions,  bilateral lower lobe atelectasis, and a large pericardial effusion.   Both his pleural effusions are simple appearing and free-flowing on point-of-care ultrasound. He underwent left-sided thoracentesis on 6/23 with analysis showing an exudative process (protein level of 3.4) based on pleural fluid only three test combination approach. The fluid was sent for cytology which is currently pending (as is pleural cholesterol). POCUS of both pleural spaces yesterday showed a residual moderate sized left sided effusion, and a small right sided effusion. Today, the left sided effusion is unchanged on my ultrasound, but the right sided effusion does appear smaller.   The differential for his exudative pleural effusion is broad and includes decompensated heart failure (bilateral effusions, afib, pericardial effusion), malignancy (given severe hyponatremia and enlarged lymph nodes), as well as serositis (in conjunction with pericardial effusion). Both his CRP and ESR are elevated supportive of an inflammatory process.   I would recommend full drainage of the left pleural space followed by a repeat CT scan of the chest with IV contrast to evaluate for any underlying mass or nodularity. I will continue to follow the patient with point-of-care ultrasound to evaluate both pleural spaces. His right sided effusion is not currently amenable to intervention.   Finally, I will consider outpatient PET/CT as well as EBUS/TBNA to sample the hilar and mediastinal lymph nodes should diagnostic uncertainty remain (no underlying mass/lesion with pleural drainage, pleural cytology negative, etc...).   -Will continue with daily POCUS to monitor pleural effusions -Check pleural fluid cholesterol, serum LDH (sent) -Follow up pleural fluid cytology -Thoracentesis for drainage of left sided pleural effusion  Belva November, MD [facility] 09/21/2023 9:23 AM    Labs   CBC: Recent Labs  Lab 09/14/23 2055  09/15/23 0511 09/16/23 0517 09/17/23 0442 09/18/23 0540 09/19/23 0147  WBC 12.1* 11.1* 15.9* 14.2* 10.6* 9.0  NEUTROABS 8.9*  --   --   --   --   --   HGB 10.9* 11.1* 10.4* 10.6* 10.2* 9.5*  HCT 31.5* 32.1* 29.7* 30.8* 28.8* 28.0*  MCV 89.2 88.4 87.6 89.0 87.0 89.5  PLT 392 343 359 371 390 380    Basic Metabolic Panel: Recent Labs  Lab 09/15/23 0511 09/16/23 0517 09/17/23 0442 09/17/23 0947 09/18/23 0540 09/18/23 1619 09/19/23 0147 09/21/23 0439  NA 125* 126* 122* 122* 125* 128* 128* 127*  K 3.8 4.1 4.0 4.0 3.3* 4.1 4.4 3.8  CL 91* 93* 91* 90* 88* 89* 91* 91*  CO2 24 23 23 22 27 27 27 25   GLUCOSE 138* 125* 127* 153* 136* 131* 130* 118*  BUN 7* 11 12 13 9 15 15 13   CREATININE 0.49* 0.51* 0.47* 0.44* 0.42* 0.57* 0.51* 0.48*  CALCIUM  8.1* 8.0* 8.0* 7.9* 8.3* 8.4* 8.8* 8.8*  MG 1.6* 2.1 1.9  --  1.6*  --   --   --    GFR: Estimated Creatinine Clearance: 97.4 mL/min (A) (by C-G formula based on SCr of 0.48 mg/dL (L)). Recent Labs  Lab 09/14/23 2055 09/14/23 2319 09/15/23 0511 09/16/23 0517 09/17/23 0442 09/18/23 0540 09/19/23 0147 09/19/23 1658  PROCALCITON <0.10  --   --   --   --   --   --  <  0.10  WBC 12.1*  --  11.1* 15.9* 14.2* 10.6* 9.0  --   LATICACIDVEN  --  0.9 0.8  --   --   --   --   --     Liver Function Tests: No results for input(s): AST, ALT, ALKPHOS, BILITOT, PROT, ALBUMIN  in the last 168 hours. No results for input(s): LIPASE, AMYLASE in the last 168 hours. No results for input(s): AMMONIA in the last 168 hours.  ABG No results found for: PHART, PCO2ART, PO2ART, HCO3, TCO2, ACIDBASEDEF, O2SAT   Coagulation Profile: No results for input(s): INR, PROTIME in the last 168 hours.  Cardiac Enzymes: No results for input(s): CKTOTAL, CKMB, CKMBINDEX, TROPONINI in the last 168 hours.  HbA1C: No results found for: HGBA1C  CBG: No results for input(s): GLUCAP in the last 168 hours.  Review of  Systems:   ROS  Past Medical History:  He,  has a past medical history of Chronic hyponatremia, Hypertension, Paroxysmal atrial fibrillation (HCC), and Pericardial effusion (08/2023).   Surgical History:   Past Surgical History:  Procedure Laterality Date   EYE SURGERY       Social History:   reports that he has been smoking cigarettes. He has a 42 pack-year smoking history. He has never used smokeless tobacco. He reports current alcohol use. He reports that he does not use drugs.   Family History:  His family history includes Heart attack in his father.   Allergies No Known Allergies   Home Medications  Prior to Admission medications   Medication Sig Start Date End Date Taking? Authorizing Provider  ascorbic acid  (VITAMIN C ) 500 MG tablet Take 1 tablet (500 mg total) by mouth 2 (two) times daily. 09/13/23  Yes Sreenath, Sudheer B, MD  folic acid  (FOLVITE ) 1 MG tablet Take 1 tablet (1 mg total) by mouth daily. 12/11/20  Yes Johnson, Clanford L, MD  metoprolol  tartrate (LOPRESSOR ) 25 MG tablet Take 1 tablet (25 mg total) by mouth 2 (two) times daily. 09/13/23  Yes Sreenath, Sudheer B, MD  Multiple Vitamin (MULTIVITAMIN WITH MINERALS) TABS tablet Take 1 tablet by mouth daily. 01/19/20  Yes Shah, Pratik D, DO  nystatin  powder Apply 1 Application topically 2 (two) times daily.   Yes [provider]  pantoprazole  (PROTONIX ) 40 MG tablet Take 1 tablet (40 mg total) by mouth daily. 09/13/23 09/12/24 Yes Sreenath, Sudheer B, MD  polyethylene glycol (MIRALAX  / GLYCOLAX ) 17 g packet Take 17 g by mouth daily as needed for mild constipation. 12/10/20  Yes Johnson, Clanford L, MD  pravastatin (PRAVACHOL) 20 MG tablet Take 20 mg by mouth daily. 10/16/19  Yes [provider]  sodium chloride  1 g tablet Take 1 tablet (1 g total) by mouth 3 (three) times daily with meals. 09/13/23  Yes Sreenath, Sudheer B, MD  thiamine  100 MG tablet Take 1 tablet (100 mg total) by mouth daily. 01/19/20   Yes Shah, Pratik D, DO  feeding supplement (ENSURE PLUS HIGH PROTEIN) LIQD Take 237 mLs by mouth 3 (three) times daily between meals. 09/13/23   Sreenath, Calvin NOVAK, MD  losartan (COZAAR) 25 MG tablet Take 25 mg by mouth daily. Patient not taking: Reported on 09/15/2023 06/20/23   [provider]     I spent 35 minutes caring for this patient today, including preparing to see the patient, obtaining a medical history , reviewing a separately obtained history, performing a medically appropriate examination and/or evaluation, counseling and educating the patient/family/caregiver, ordering medications, tests, or procedures,  documenting clinical information in the electronic health record, and independently interpreting results (not separately reported/billed) and communicating results to the patient/family/caregiver

## 2023-09-21 NOTE — TOC Progression Note (Signed)
 Transition of Care [facility]) - Progression Note    Patient Details  Name: Barry Fowler MRN: [id number] Date of Birth: 05-18-1955  Transition of Care [facility]) CM/SW Contact  Quintella Suzen Jansky, RN Phone Number: 09/21/2023, 10:16 AM  Clinical Narrative:     S/P thoracentesis, remains on 3LPM. TOC to continue to follow for medical readiness. Will need to initiate new auth for [employer].  Expected Discharge Plan: Skilled Nursing Facility Barriers to Discharge: Continued Medical Work up  Expected Discharge Plan and Services     Post Acute Care Choice: Skilled Nursing Facility Living arrangements for the past 2 months: Apartment                   DME Agency: NA       HH Arranged: NA           Social Determinants of Health (SDOH) Interventions SDOH Screenings   Food Insecurity: No Food Insecurity (09/16/2023)  Housing: Low Risk  (09/16/2023)  Transportation Needs: No Transportation Needs (09/16/2023)  Utilities: Not At Risk (09/16/2023)  Social Connections: Unknown (09/16/2023)  Tobacco Use: High Risk (09/15/2023)    Readmission Risk Interventions     No data to display

## 2023-09-21 NOTE — Plan of Care (Signed)
  Problem: Clinical Measurements: Goal: Ability to maintain clinical measurements within normal limits will improve Outcome: Progressing   Problem: Education: Goal: Knowledge of General Education information will improve Description: Including pain rating scale, medication(s)/side effects and non-pharmacologic comfort measures Outcome: Progressing   

## 2023-09-22 ENCOUNTER — Inpatient Hospital Stay

## 2023-09-22 DIAGNOSIS — R0902 Hypoxemia: Secondary | ICD-10-CM | POA: Diagnosis not present

## 2023-09-22 DIAGNOSIS — Z9889 Other specified postprocedural states: Secondary | ICD-10-CM | POA: Diagnosis not present

## 2023-09-22 DIAGNOSIS — J189 Pneumonia, unspecified organism: Secondary | ICD-10-CM | POA: Diagnosis not present

## 2023-09-22 DIAGNOSIS — R0602 Shortness of breath: Secondary | ICD-10-CM | POA: Diagnosis not present

## 2023-09-22 DIAGNOSIS — E8770 Fluid overload, unspecified: Secondary | ICD-10-CM | POA: Diagnosis not present

## 2023-09-22 DIAGNOSIS — J90 Pleural effusion, not elsewhere classified: Secondary | ICD-10-CM | POA: Diagnosis not present

## 2023-09-22 LAB — BASIC METABOLIC PANEL WITH GFR
Anion gap: 8 (ref 5–15)
BUN: 15 mg/dL (ref 8–23)
CO2: 28 mmol/L (ref 22–32)
Calcium: 8.7 mg/dL — ABNORMAL LOW (ref 8.9–10.3)
Chloride: 91 mmol/L — ABNORMAL LOW (ref 98–111)
Creatinine, Ser: 0.43 mg/dL — ABNORMAL LOW (ref 0.61–1.24)
GFR, Estimated: >60 mL/min (ref >60)
Glucose, Bld: 115 mg/dL — ABNORMAL HIGH (ref 70–99)
Potassium: 4.1 mmol/L (ref 3.5–5.1)
Sodium: 127 mmol/L — ABNORMAL LOW (ref 135–145)

## 2023-09-22 LAB — CYTOLOGY - NON PAP

## 2023-09-22 NOTE — Plan of Care (Signed)
   Problem: Education: Goal: Knowledge of General Education information will improve Description Including pain rating scale, medication(s)/side effects and non-pharmacologic comfort measures Outcome: Progressing

## 2023-09-22 NOTE — Progress Notes (Signed)
 BP 91/50. MD made aware. Will continue to monitor.

## 2023-09-22 NOTE — TOC Progression Note (Signed)
 Transition of Care [facility]) - Progression Note    Patient Details  Name: Barry Fowler MRN: [id number] Date of Birth: Aug 27, 1955  Transition of Care [facility]) CM/SW Contact  Quintella Suzen Jansky, RN Phone Number: 09/22/2023, 7:59 AM  Clinical Narrative:     Remains on 3LPM via nasal cannula, weaning as tolerated. Patient has pleural effusion, Pulmonary following, may need additional thoracentesis. Still receiving IV Lasix . TOC will continue to follow for discharge needs. Plan to return to [employer], will need authorization.   Expected Discharge Plan: Skilled Nursing Facility Barriers to Discharge: Continued Medical Work up  Expected Discharge Plan and Services     Post Acute Care Choice: Skilled Nursing Facility Living arrangements for the past 2 months: Apartment                   DME Agency: NA       HH Arranged: NA           Social Determinants of Health (SDOH) Interventions SDOH Screenings   Food Insecurity: No Food Insecurity (09/16/2023)  Housing: Low Risk  (09/16/2023)  Transportation Needs: No Transportation Needs (09/16/2023)  Utilities: Not At Risk (09/16/2023)  Social Connections: Unknown (09/16/2023)  Tobacco Use: High Risk (09/15/2023)    Readmission Risk Interventions     No data to display

## 2023-09-22 NOTE — Progress Notes (Signed)
 Rounding Note   Patient Name: Barry Fowler Date of Encounter: 09/22/2023  [facility] Cardiologist: [facility] End  Subjective Sitting up in recliner, no complaints CT scan chest results reviewed with him in detail, moderate pleural effusion on right, small on left, moderate pericardial effusion Denies shortness of breath or chest pain at rest Drinking Ensure He reports nutrition has been poor  Scheduled Meds:  ascorbic acid   500 mg Oral BID   colchicine  0.6 mg Oral BID   enoxaparin  (LOVENOX ) injection  40 mg Subcutaneous Q24H   feeding supplement  237 mL Oral BID BM   folic acid   1 mg Oral Daily   furosemide   40 mg Intravenous BID   lidocaine   1 patch Transdermal Q24H   metoprolol  tartrate  25 mg Oral BID   multivitamin with minerals  1 tablet Oral Daily   nicotine   21 mg Transdermal Daily   nystatin   1 Application Topical BID   pantoprazole   40 mg Oral Daily   polyethylene glycol  17 g Oral Daily   sodium chloride   1 g Oral TID WC   thiamine   100 mg Oral Daily   Continuous Infusions:  PRN Meds: albuterol , dextromethorphan -guaiFENesin , hydrALAZINE, ibuprofen , ondansetron  (ZOFRAN ) IV, senna-docusate   Vital Signs  Vitals:   09/22/23 0005 09/22/23 0435 09/22/23 0500 09/22/23 0816  BP: 91/64 96/74  (!) 88/63  Pulse: 71 71  86  Resp: 20 18  16   Temp: 98.2 F (36.8 C) 97.7 F (36.5 C)  98.9 F (37.2 C)  TempSrc: Oral Oral  Oral  SpO2: 95% 93%  96%  Weight:   75 kg   Height:        Intake/Output Summary (Last 24 hours) at 09/22/2023 0950 Last data filed at 09/21/2023 1732 Gross per 24 hour  Intake 200 ml  Output 350 ml  Net -150 ml      09/22/2023    5:00 AM 09/21/2023    5:00 AM 09/20/2023    5:00 AM  Last 3 Weights  Weight (lbs) 165 lb 5.5 oz 167 lb 1.7 oz 172 lb 2.9 oz  Weight (kg) 75 kg 75.8 kg 78.1 kg      Telemetry Normal sinus rhythm- Personally Reviewed  ECG   - Personally Reviewed  Physical Exam  Constitutional:  oriented to  person, place, and time. No distress.  HENT:  Head: Grossly normal Eyes:  no discharge. No scleral icterus.  Neck: No JVD, no carotid bruits  Cardiovascular: Regular rate and rhythm, no murmurs appreciated Pulmonary/Chest: Clear to auscultation bilaterally, no wheezes or rales Abdominal: Soft.  no distension.  no tenderness.  Musculoskeletal: Normal range of motion, muscle atrophy Neurological:  normal muscle tone. Coordination normal. No atrophy Skin: Skin warm and dry Psychiatric: normal affect, pleasant   Labs High Sensitivity Troponin:   Recent Labs  Lab 09/06/23 0347 09/06/23 0705 09/14/23 2055  TROPONINIHS 21* 15 17     Chemistry Recent Labs  Lab 09/16/23 0517 09/17/23 0442 09/17/23 0947 09/18/23 0540 09/18/23 1619 09/19/23 0147 09/21/23 0439 09/22/23 0508  NA 126* 122*   < > 125*   < > 128* 127* 127*  K 4.1 4.0   < > 3.3*   < > 4.4 3.8 4.1  CL 93* 91*   < > 88*   < > 91* 91* 91*  CO2 23 23   < > 27   < > 27 25 28   GLUCOSE 125* 127*   < > 136*   < >  130* 118* 115*  BUN 11 12   < > 9   < > 15 13 15   CREATININE 0.51* 0.47*   < > 0.42*   < > 0.51* 0.48* 0.43*  CALCIUM  8.0* 8.0*   < > 8.3*   < > 8.8* 8.8* 8.7*  MG 2.1 1.9  --  1.6*  --   --   --   --   GFRNONAA >60 >60   < > >60   < > >60 >60 >60  ANIONGAP 10 8   < > 10   < > 10 11 8    < > = values in this interval not displayed.    Lipids No results for input(s): CHOL, TRIG, HDL, LABVLDL, LDLCALC, CHOLHDL in the last 168 hours.  Hematology Recent Labs  Lab 09/17/23 0442 09/18/23 0540 09/19/23 0147  WBC 14.2* 10.6* 9.0  RBC 3.46* 3.31* 3.13*  HGB 10.6* 10.2* 9.5*  HCT 30.8* 28.8* 28.0*  MCV 89.0 87.0 89.5  MCH 30.6 30.8 30.4  MCHC 34.4 35.4 33.9  RDW 14.6 14.3 14.4  PLT 371 390 380   Thyroid No results for input(s): TSH, FREET4 in the last 168 hours.  BNP Recent Labs  Lab 09/17/23 0442 09/19/23 0147  BNP 207.4* 202.9*    DDimer No results for input(s): DDIMER in the last 168  hours.   Radiology  CT CHEST W CONTRAST Result Date: 09/22/2023 CLINICAL DATA:  Pleural effusion.  Malignancy suspected. EXAM: CT CHEST WITH CONTRAST TECHNIQUE: Multidetector CT imaging of the chest was performed during intravenous contrast administration. RADIATION DOSE REDUCTION: This exam was performed according to the departmental dose-optimization program which includes automated exposure control, adjustment of the mA and/or kV according to patient size and/or use of iterative reconstruction technique. CONTRAST:  75mL OMNIPAQUE IOHEXOL 300 MG/ML  SOLN COMPARISON:  09/19/2023 FINDINGS: Cardiovascular: Heart size is normal. Persistent moderate pericardial effusion identified. Aortic atherosclerosis and coronary artery calcifications. Mediastinum/Nodes: Thyroid gland, trachea and esophagus demonstrate no significant findings. Mediastinal adenopathy is again seen. Index right paratracheal lymph node measures 1.5 cm, image 71/2. Unchanged. Multiple prominent subcarinal lymph nodes are identified. The largest measures 1.2 cm, image 81/2. Also unchanged. Right hilar lymph node measures 1.2 cm, image 80/2. Unchanged. Lungs/Pleura: Central airways are grossly patent. Moderate right pericardial effusion with increasing atelectasis and consolidation within the right lower lobe significant decrease in volume of previous left pleural effusion, now small. There is passive atelectasis noted within the left base. Emphysema with diffuse bronchial wall thickening. Bilateral posterior upper lobe ground-glass attenuation is again noted. There also a few patchy areas of ground-glass attenuation scattered throughout the remaining portions of the upper lobes similar to previous exam. No suspicious lung nodule or mass identified. Upper Abdomen: No acute abnormality. Similar appearance of mild nodular thickening of the adrenal glands with similar 4 mm calcification in the right adrenal gland. No mass or adenopathy identified.  Musculoskeletal: Remote healed right lateral rib fractures as before. No acute or suspicious osseous findings. IMPRESSION: 1. Persistent moderate pericardial effusion. 2. Persistent moderate right pleural effusion with progressive atelectasis and consolidation in the right lower lobe. 3. Interval decrease in volume of prior moderate left pleural effusion status post thoracentesis, now small. There is passive atelectasis within the left lower lobe. 4. Stable mediastinal and right hilar adenopathy. Nonspecific, possibly reactive in the setting of CHF hand or infection. In the absence of a known malignancy, according to consensus criteria, follow-up imaging in 3 months with repeat CT of  the chest is advised 5. Stable bilateral upper lobe ground-glass attenuation. Findings may reflect areas of asymmetric edema or atypical infection. 6. Coronary artery calcifications. 7. Aortic Atherosclerosis (ICD10-I70.0) and Emphysema (ICD10-J43.9). Electronically Signed   By: Waddell Calk M.D.   On: 09/22/2023 04:39   DG Chest Port 1 View Result Date: 09/21/2023 CLINICAL DATA:  Pleural effusion. EXAM: PORTABLE CHEST 1 VIEW COMPARISON:  Chest radiograph dated 09/19/2023. FINDINGS: Small left pleural effusion is slightly decreased since the prior exam. No pneumothorax. Slightly decreased small right pleural effusion with right basilar opacity favored to reflect atelectasis. Stable cardiomegaly. Aortic atherosclerosis. Visualized osseous structures are unchanged. IMPRESSION: 1. Slightly decreased small left pleural effusion.  No pneumothorax. 2. Slightly decreased small right pleural effusion with right basilar opacity favored to reflect atelectasis. Electronically Signed   By: Harrietta Sherry M.D.   On: 09/21/2023 12:34   US  THORACENTESIS ASP PLEURAL SPACE W/IMG GUIDE Result Date: 09/21/2023 INDICATION: 68 year old male admitted for hypoxia with associated pleural effusions, left greater than right. Request received for  diagnostic and therapeutic thoracentesis. EXAM: ULTRASOUND GUIDED DIAGNOSTIC AND THERAPEUTIC THORACENTESIS MEDICATIONS: 5 cc of 1% lidocaine  COMPLICATIONS: None immediate. PROCEDURE: An ultrasound guided thoracentesis was thoroughly discussed with the patient and questions answered. The benefits, risks, alternatives and complications were also discussed. The patient understands and wishes to proceed with the procedure. Written consent was obtained. Ultrasound was performed to localize and mark an adequate pocket of fluid in the left chest. The area was then prepped and draped in the normal sterile fashion. 1% Lidocaine  was used for local anesthesia. Under ultrasound guidance a 6 Fr Safe-T-Centesis catheter was introduced. Thoracentesis was performed. The catheter was removed and a dressing applied. FINDINGS: A total of approximately 300 mL of clear, straw-colored pleural fluid was removed. Samples were sent to the laboratory as requested by the clinical team. IMPRESSION: Successful ultrasound guided left thoracentesis yielding 300 cc of pleural fluid. Procedure performed by Carlin Griffon, PA-C Electronically Signed   By: Juliene Balder M.D.   On: 09/21/2023 12:22   ECHOCARDIOGRAM LIMITED Result Date: 09/20/2023    ECHOCARDIOGRAM LIMITED REPORT   Patient Name:   Barry Fowler Date of Exam: 09/20/2023 Medical Rec #:  [id number]   Height:       72.0 in Accession #:    [id number]  Weight:       172.2 lb Date of Birth:  March 30, 1955   BSA:          1.999 m Patient Age:    67 years    BP:           140/86 mmHg Patient Gender: M           HR:           77 bpm. Exam Location:  [facility] Procedure: Limited Echo, Color Doppler and Cardiac Doppler (Both Spectral and            Color Flow Doppler were utilized during procedure). Indications:     Pericardial Effusion I31.3  History:         Patient has prior history of Echocardiogram examinations, most                  recent 09/12/2023. Risk Factors:Hypertension.  Sonographer:     Christopher Furnace Referring Phys:  [id number] FLYJFFJI A ARIDA Diagnosing Phys: Lonni Hanson MD IMPRESSIONS  1. Left ventricular ejection fraction, by estimation, is 60 to 65%. The left ventricle has normal function. There is mild left ventricular  hypertrophy.  2. Right ventricular systolic function is low normal. The right ventricular size is normal. There is normal pulmonary artery systolic pressure.  3. Right atrial size was mildly dilated.  4. Moderate pericardial effusion. The pericardial effusion is posterior to the left ventricle and lateral to the left ventricle. There is no evidence of cardiac tamponade.  5. The aortic valve is tricuspid.  6. The inferior vena cava is dilated in size with >50% respiratory variability, suggesting right atrial pressure of 8 mmHg. Comparison(s): A prior study was performed on 09/12/2023. Pericardial effusion appears similar in size but is less circumferential and more localized to the posterolateral left ventricle. FINDINGS  Left Ventricle: Left ventricular ejection fraction, by estimation, is 60 to 65%. The left ventricle has normal function. The left ventricular internal cavity size was normal in size. There is mild left ventricular hypertrophy. Right Ventricle: The right ventricular size is normal. Right ventricular systolic function is low normal. There is normal pulmonary artery systolic pressure. The tricuspid regurgitant velocity is 1.62 m/s, and with an assumed right atrial pressure of 8 mmHg, the estimated right ventricular systolic pressure is 18.5 mmHg. Left Atrium: Left atrial size was normal in size. Right Atrium: Right atrial size was mildly dilated. Pericardium: A moderately sized pericardial effusion is present. The pericardial effusion is posterior to the left ventricle and lateral to the left ventricle. There is no evidence of cardiac tamponade. Tricuspid Valve: The tricuspid valve is not well visualized. Aortic Valve: The aortic valve is tricuspid. Pulmonic Valve: The  pulmonic valve was not well visualized. Pulmonic valve regurgitation is not visualized. No evidence of pulmonic stenosis. Aorta: The aortic root is normal in size and structure. Venous: The inferior vena cava is dilated in size with greater than 50% respiratory variability, suggesting right atrial pressure of 8 mmHg. Additional Comments: There is a small pleural effusion in the left lateral region. Spectral Doppler performed. Color Doppler performed.  LEFT VENTRICLE PLAX 2D LVIDd:         3.70 cm LVIDs:         2.70 cm LV PW:         1.20 cm LV IVS:        1.20 cm LVOT diam:     2.00 cm LVOT Area:     3.14 cm  RIGHT VENTRICLE RV Basal diam:  4.00 cm RV Mid diam:    3.40 cm LEFT ATRIUM           Index        RIGHT ATRIUM           Index LA diam:      3.10 cm 1.55 cm/m   RA Area:     23.40 cm LA Vol (A2C): 46.7 ml 23.36 ml/m  RA Volume:   75.00 ml  37.52 ml/m LA Vol (A4C): 16.3 ml 8.15 ml/m   AORTA Ao Root diam: 3.50 cm TRICUSPID VALVE TR Peak grad:   10.5 mmHg TR Vmax:        162.00 cm/s  SHUNTS Systemic Diam: 2.00 cm Lonni Hanson MD Electronically signed by Lonni Hanson MD Signature Date/Time: 09/20/2023/3:27:51 PM    Final     Cardiac Studies Echo 1. Left ventricular ejection fraction, by estimation, is 60 to 65%. The  left ventricle has normal function. There is mild left ventricular  hypertrophy.   2. Right ventricular systolic function is low normal. The right  ventricular size is normal. There is normal pulmonary artery systolic  pressure.  3. Right atrial size was mildly dilated.   4. Moderate pericardial effusion. The pericardial effusion is posterior  to the left ventricle and lateral to the left ventricle. There is no  evidence of cardiac tamponade.   5. The aortic valve is tricuspid.   6. The inferior vena cava is dilated in size with >50% respiratory  variability, suggesting right atrial pressure of 8 mmHg.   Patient Profile   Barry Fowler is a 68 y.o. male with a hx of  moderate pericardial effusion by recent echoes, paroxysmal atrial fibrillation, hypertension, alcohol abuse complicated by chronic hyponatremia and recurrent falls who is being seen 09/20/2023 for the evaluation of pericardial effusion   Assessment & Plan  Pericardial effusion Moderate on echocardiogram with no tamponade -Relatively asymptomatic -Not a good candidate for pericardiocentesis given asymptomatic, posteriorly located effusion which would be difficult to tap Elevated CRP, sed rate of unclear significance -On Lasix  with colchicine - Stressed importance of good nutrition, alcohol cessation  Pleural effusion Albumin  less than 3, alcohol abuse, hyponatremia, -Thoracentesis on left completed this admission, plan for thoracentesis on right  Given poor nutrition and alcohol, high risk of recurrence  Arrhythmia No significant atrial fibrillation or other arrhythmia on telemetry EKG showing normal sinus rhythm No indication for anticoagulation  - Given low blood pressure metoprolol  changed to succinate 12.5 daily  Hyponatremia In the setting of alcohol abuse Chronic issue, alcohol cessation recommended - Free water restriction  [facility] will sign off.   Medication Recommendations: Medication changes as above Other recommendations (labs, testing, etc): No further testing Follow up as an outpatient: Outpatient follow-up in cardiology clinic for periodic echocardiography of his pericardial effusion    For questions or updates, please contact [facility] Please consult [email or web address] for contact info under     Signed, Charlette Hennings, MD  09/22/2023, 9:50 AM

## 2023-09-22 NOTE — Progress Notes (Signed)
 BP 88/63 MD made aware. Will continue to monitor. Patient is asymptomatic.

## 2023-09-22 NOTE — TOC Progression Note (Signed)
 Transition of Care [facility]) - Progression Note    Patient Details  Name: Barry Fowler MRN: [id number] Date of Birth: 02-Jan-1956  Transition of [facility]) CM/SW Contact  Quintella Suzen Jansky, RN Phone Number: 09/22/2023, 3:34 PM  Clinical Narrative:    Reeda barrows for [employer], Pending [id number]   Expected Discharge Plan: Skilled Nursing Facility Barriers to Discharge: Continued Medical Work up  Expected Discharge Plan and Services     Post Acute Care Choice: Skilled Nursing Facility Living arrangements for the past 2 months: Apartment                   DME Agency: NA       HH Arranged: NA           Social Determinants of Health (SDOH) Interventions SDOH Screenings   Food Insecurity: No Food Insecurity (09/16/2023)  Housing: Low Risk  (09/16/2023)  Transportation Needs: No Transportation Needs (09/16/2023)  Utilities: Not At Risk (09/16/2023)  Social Connections: Unknown (09/16/2023)  Tobacco Use: High Risk (09/15/2023)    Readmission Risk Interventions     No data to display

## 2023-09-22 NOTE — Assessment & Plan Note (Signed)
 Blood pressure borderline soft. - Continuing metoprolol  -Holding home antihypertensives - Holding IV Lasix  today

## 2023-09-22 NOTE — Progress Notes (Signed)
 Progress Note   Patient: Barry Fowler FMW:[id number] DOB: [birth year] DOA: 09/14/2023     8 DOS: the patient was seen and examined on 09/22/2023   Brief hospital course: Taken from prior notes.  Barry Fowler is a 68 y.o. male with medical history significant of tobacco abuse, alcohol abuse, hypertension, chronic hyponatremia, who presents with SOB.   Patient was recently hospitalized from 6/9 - 6/17 due to severe hyponatremia with sodium 104 which improved to 124 at discharge; also had new onset A-fib, started on metoprolol  not anticoagulant since patient is a poor candidate for anticoagulant use.  Patient also had possible pneumonia which was treated with Rocephin  and doxycycline  in hospital.   Patient was readmitted 2 days later with worsening dyspnea and continue to have cough with little mucus production.  He was found to be hypoxic up to 80% on room air, initially started on nonrebreather and later switched to 5 L of oxygen.  No baseline oxygen use.  Labs with concern of hyponatremia.  Chest x-ray with concern of pulmonary edema and bilateral lower lobe infiltrates.  BNP was elevated at 412.  Patient was started on broad-spectrum antibiotics for concern of HCAP.  Also received Lasix  infusion for a day with IV albumin  due to softer blood pressure to help with diuresis, due to decrease in UOP on 6/22 Lasix  infusion was held.  Clinically appears euvolemic.  BNP on repeat was 202.  Patient had an echo done on 09/12/2023 which shows normal EF and moderate pericardial effusion, no sign of cardiac tamponade.  IVC was dilated with less than 50% respiratory variability and a mild pulmonary hypertension.  6/23: Vital stable with borderline soft blood pressure and patient continued to require 3 to 4 L of oxygen, continue to have cough and worsening dyspnea.  Sodium at 128.  Leukocytosis has been resolved.  BNP 202 today.  CT chest with contrast was obtained which shows a large pericardial effusion with some  pericardial thickening.  Moderate pleural effusions with adjacent lung opacities, left greater than right.  Also has few enlarged and prominent mediastinal nodes which could be reactive but need additional workup like PET scan to rule out any underlying malignancy.  Slight nodular contour of the liver.  Thoracentesis with lab was also ordered.  Cardiology and pulmonary was consulted.  Procalcitonin on the day of this readmission was negative.  Repeating procalcitonin and if remain negative we will discontinue antibiotics.  6/24: Vital stable, procalcitonin remained negative so discontinuing antibiotics, hypoosmolar hyponatremia.  Urine osmolality and sodium elevated as compared to the prior check 2 weeks ago.-Patient is on salt tablets.  S/p thoracentesis with removal of 900 mL of amber-colored pleural fluid.  Labs with  elevated LDH at 116, and protein of 3.5, makes a transudative fluid.  Cultures pending Repeat limited echocardiogram pending.  Starting on Lasix  40 mg daily. Pulmonary is recommending monitoring bilateral pleural effusions likely will need more drainage from left.  Patient will need outpatient PET scan and EBUS if needed to rule out any underlying malignancy.  6/25: Vital stable, labs with slight worsening of hyponatremia with sodium at 127, elevated inflammatory markers with CRP 9.6 and ESR 70-starting on colchicine as patient has both pleural and pericardial effusions.  Repeat limited echocardiogram with moderate pericardial effusion mostly posterolateral to left ventricle, no sign of tamponade.  Not amenable to pericardiocentesis at this time per cardiology. Continuing diuresis.  Patient underwent left thoracentesis today  6/26: Hemodynamically stable and clinically improving.  Pulmonary ordered another thoracentesis  on right today but there was not enough fluid to drain so it got canceled.  Stable sodium at 127.  Blood pressure borderline soft so holding today's Lasix .  Repeat CT  chest with moderate pericardial effusion, persistent right pleural effusion and improvement of left pleural effusion.  Stable mediastinal and hilar lymphadenopathy.  Pulmonary will arrange outpatient PET scan for further evaluation after discharge.   Assessment and Plan: * Acute respiratory failure with hypoxia (HCC) Patient with no baseline oxygen use.  Initially required nonrebreather, currently on 2 L of oxygen.  Initial concern of HCAP but procalcitonin was negative.  Chest x-ray on admission with concern of increased interstitial markings. CT chest  with bilateral pleural effusions and large pericardial effusion.  Repeat with improvement of left pleural effusion, moderate pericardial effusion and stable right pleural effusion-apparently not enough for thoracentesis. - Continue with supplemental oxygen -Wean as tolerated  Pneumonia due to infectious organism Initial concern of HCAP as he was admitted recently with concern of pneumonia and completed a course of antibiotic. MRSA PCR negative.  Initially started on cefepime  and vancomycin  and continued on cefepime  only.  Blood cultures negative.  Urine strep pneumo and Legionella antigen negative. CT chest with contrast today with bilateral pleural effusion and adjacent lung opacities which need further investigation.  Procalcitonin negative -Stopping antibiotics - Continue with supportive care  Pericardial effusion Echocardiogram done on 6/16 with moderate circumferential pericardial effusion.  CT chest today with concern of large pericardial effusions. Patient did receive Lasix  infusion initially which was later discontinued due to decrease in UOP.  .  Renal function seems stable.  Elevated inflammatory markers Repeat limited echo with moderate pericardial effusion, located posterolaterally to left ventricle, not amenable for paracentesis, no sign of tamponade. - Cardiology is on board -Started on colchicine -Continue with IV Lasix  40 mg  twice daily -Daily weight and BMP -Strict intake and output  Bilateral pleural effusion CT chest today with bilateral pleural effusions left greater than right.  S/p ultrasound-guided left-sided thoracentesis, elevated LDH and protein which makes it transudative fluid.  Procalcitonin negative. Pulmonary was consulted- S/p left thoracentesis twice resulted in resolution of left pleural effusion, persistent moderate right pleural effusion but apparently not enough for thoracentesis - Preliminary cultures with no organism - Cytology pending - Patient likely need outpatient PET scan and EBUS to rule out any underlying malignancy.   Hyponatremia Seems like having chronic hyponatremia.  Has an history of alcohol abuse.  Sodium stable at 127 today and patient is on salt tablets.  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  Labs with hypotonic hyponatremia  -Continue with salt tablets -Monitor sodium  Paroxysmal atrial fibrillation (HCC) Currently in sinus rhythm.  Paroxysmal A-fib with RVR. Not on any anticoagulation at this time. - Continue with metoprolol   HTN (hypertension) Blood pressure borderline soft. - Continuing metoprolol  -Holding home antihypertensives - Holding IV Lasix  today  Alcohol abuse Patient did not drink alcohol since discharge to SNF after prior hospitalization. -Continue thiamine  and folic acid   Tobacco abuse Counseling was provided -Nicotine  patch as needed      Subjective: Patient was seen and examined today.  Improving shortness of breath.  Still on 2 to 3 L of oxygen.  Physical Exam: Vitals:   09/22/23 0435 09/22/23 0500 09/22/23 0816 09/22/23 1023  BP: 96/74  (!) 88/63 (!) 91/56  Pulse: 71  86 88  Resp: 18  16   Temp: 97.7 F (36.5 C)  98.9 F (37.2 C)   TempSrc:  Oral  Oral   SpO2: 93%  96% 95%  Weight:  75 kg    Height:       General.  Frail gentleman, in no acute distress. Pulmonary.  Lungs clear bilaterally, normal respiratory  effort. CV.  Regular rate and rhythm, no JVD, rub or murmur. Abdomen.  Soft, nontender, nondistended, BS positive. CNS.  Alert and oriented .  No focal neurologic deficit. Extremities.  No edema, no cyanosis, pulses intact and symmetrical. Psychiatry.  Judgment and insight appears normal.    Data Reviewed: Prior data reviewed.  Family Communication: Discussed with patient.  Disposition: Status is: Inpatient Remains inpatient appropriate because: Severity of illness.  Planned Discharge Destination: Skilled nursing facility  DVT prophylaxis.  Lovenox  Time spent: 50 minutes  This record has been created using Conservation officer, historic buildings. Errors have been sought and corrected,but may not always be located. Such creation errors do not reflect on the standard of care.   Author: Amaryllis Dare, MD 09/22/2023 2:53 PM  For on call review [email or web address].

## 2023-09-22 NOTE — Procedures (Signed)
 Patient presents for therapeutic and diagnostic thoracentesis. I was present during limited US  of the chest to evaluate for pleural effusion. US  limited shows minimal pleural fluid with consolidation/atelectatic lung. There is no adequate pocket of fluid to safely proceed with thoracentesis. Due to circumstances unable to perform a safe thoracentesis.   After discussion of the risks versus the benefits of the procedure the patient elected to defer the thoracentesis at this time.  Procedure not performed.   Graycen Sadlon CHRISTELLA Bal PA-C 09/22/2023 11:30 AM

## 2023-09-23 DIAGNOSIS — F17201 Nicotine dependence, unspecified, in remission: Secondary | ICD-10-CM | POA: Diagnosis not present

## 2023-09-23 DIAGNOSIS — I4891 Unspecified atrial fibrillation: Secondary | ICD-10-CM | POA: Diagnosis not present

## 2023-09-23 DIAGNOSIS — I10 Essential (primary) hypertension: Secondary | ICD-10-CM | POA: Diagnosis not present

## 2023-09-23 DIAGNOSIS — R590 Localized enlarged lymph nodes: Secondary | ICD-10-CM | POA: Diagnosis not present

## 2023-09-23 DIAGNOSIS — Z9889 Other specified postprocedural states: Secondary | ICD-10-CM | POA: Diagnosis not present

## 2023-09-23 DIAGNOSIS — E871 Hypo-osmolality and hyponatremia: Secondary | ICD-10-CM | POA: Diagnosis not present

## 2023-09-23 DIAGNOSIS — Z743 Need for continuous supervision: Secondary | ICD-10-CM | POA: Diagnosis not present

## 2023-09-23 DIAGNOSIS — Z7401 Bed confinement status: Secondary | ICD-10-CM | POA: Diagnosis not present

## 2023-09-23 DIAGNOSIS — E569 Vitamin deficiency, unspecified: Secondary | ICD-10-CM | POA: Diagnosis not present

## 2023-09-23 DIAGNOSIS — Z720 Tobacco use: Secondary | ICD-10-CM | POA: Diagnosis not present

## 2023-09-23 DIAGNOSIS — E119 Type 2 diabetes mellitus without complications: Secondary | ICD-10-CM | POA: Diagnosis not present

## 2023-09-23 DIAGNOSIS — Z741 Need for assistance with personal care: Secondary | ICD-10-CM | POA: Diagnosis not present

## 2023-09-23 DIAGNOSIS — N1831 Chronic kidney disease, stage 3a: Secondary | ICD-10-CM | POA: Diagnosis not present

## 2023-09-23 DIAGNOSIS — I480 Paroxysmal atrial fibrillation: Secondary | ICD-10-CM | POA: Diagnosis not present

## 2023-09-23 DIAGNOSIS — S51011A Laceration without foreign body of right elbow, initial encounter: Secondary | ICD-10-CM | POA: Diagnosis not present

## 2023-09-23 DIAGNOSIS — J9601 Acute respiratory failure with hypoxia: Secondary | ICD-10-CM | POA: Diagnosis not present

## 2023-09-23 DIAGNOSIS — F17210 Nicotine dependence, cigarettes, uncomplicated: Secondary | ICD-10-CM | POA: Diagnosis not present

## 2023-09-23 DIAGNOSIS — D649 Anemia, unspecified: Secondary | ICD-10-CM | POA: Diagnosis not present

## 2023-09-23 DIAGNOSIS — J9600 Acute respiratory failure, unspecified whether with hypoxia or hypercapnia: Secondary | ICD-10-CM | POA: Diagnosis not present

## 2023-09-23 DIAGNOSIS — E440 Moderate protein-calorie malnutrition: Secondary | ICD-10-CM | POA: Diagnosis not present

## 2023-09-23 DIAGNOSIS — M109 Gout, unspecified: Secondary | ICD-10-CM | POA: Diagnosis not present

## 2023-09-23 DIAGNOSIS — E785 Hyperlipidemia, unspecified: Secondary | ICD-10-CM | POA: Diagnosis not present

## 2023-09-23 DIAGNOSIS — G9341 Metabolic encephalopathy: Secondary | ICD-10-CM | POA: Diagnosis not present

## 2023-09-23 DIAGNOSIS — J189 Pneumonia, unspecified organism: Secondary | ICD-10-CM | POA: Diagnosis not present

## 2023-09-23 DIAGNOSIS — J90 Pleural effusion, not elsewhere classified: Secondary | ICD-10-CM | POA: Diagnosis not present

## 2023-09-23 DIAGNOSIS — I3139 Other pericardial effusion (noninflammatory): Secondary | ICD-10-CM | POA: Diagnosis not present

## 2023-09-23 DIAGNOSIS — F17200 Nicotine dependence, unspecified, uncomplicated: Secondary | ICD-10-CM | POA: Diagnosis not present

## 2023-09-23 DIAGNOSIS — K219 Gastro-esophageal reflux disease without esophagitis: Secondary | ICD-10-CM | POA: Diagnosis not present

## 2023-09-23 DIAGNOSIS — E8770 Fluid overload, unspecified: Secondary | ICD-10-CM | POA: Diagnosis not present

## 2023-09-23 DIAGNOSIS — M6259 Muscle wasting and atrophy, not elsewhere classified, multiple sites: Secondary | ICD-10-CM | POA: Diagnosis not present

## 2023-09-23 DIAGNOSIS — R2681 Unsteadiness on feet: Secondary | ICD-10-CM | POA: Diagnosis not present

## 2023-09-23 DIAGNOSIS — G8929 Other chronic pain: Secondary | ICD-10-CM | POA: Diagnosis not present

## 2023-09-23 LAB — BASIC METABOLIC PANEL WITH GFR
Anion gap: 9 (ref 5–15)
BUN: 12 mg/dL (ref 8–23)
CO2: 27 mmol/L (ref 22–32)
Calcium: 8.9 mg/dL (ref 8.9–10.3)
Chloride: 93 mmol/L — ABNORMAL LOW (ref 98–111)
Creatinine, Ser: 0.47 mg/dL — ABNORMAL LOW (ref 0.61–1.24)
GFR, Estimated: >60 mL/min (ref >60)
Glucose, Bld: 127 mg/dL — ABNORMAL HIGH (ref 70–99)
Potassium: 4.3 mmol/L (ref 3.5–5.1)
Sodium: 129 mmol/L — ABNORMAL LOW (ref 135–145)

## 2023-09-23 LAB — ANCA PROFILE
Anti-MPO Antibodies: 1 U — ABNORMAL HIGH (ref 0.0–0.9)
Anti-PR3 Antibodies: 0.9 U (ref 0.0–0.9)
Atypical P-ANCA titer: <1:20 {titer}
C-ANCA: <1:20 {titer}
P-ANCA: <1:20 {titer}

## 2023-09-23 LAB — ANA COMPREHENSIVE PANEL
Anti JO-1: 3.3 AI — ABNORMAL HIGH (ref 0.0–0.9)
Centromere Ab Screen: <0.2 AI (ref 0.0–0.9)
Chromatin Ab SerPl-aCnc: 0.2 AI (ref 0.0–0.9)
ENA SM Ab Ser-aCnc: 3.4 AI — ABNORMAL HIGH (ref 0.0–0.9)
Ribonucleic Protein: <0.2 AI (ref 0.0–0.9)
SSA (Ro) (ENA) Antibody, IgG: 0.9 AI (ref 0.0–0.9)
SSB (La) (ENA) Antibody, IgG: <0.2 AI (ref 0.0–0.9)
Scleroderma (Scl-70) (ENA) Antibody, IgG: 0.3 AI (ref 0.0–0.9)
ds DNA Ab: 27 [IU]/mL — ABNORMAL HIGH (ref 0–9)

## 2023-09-23 LAB — CHOLESTEROL, BODY FLUID: Cholesterol, Fluid: 65 mg/dL

## 2023-09-23 LAB — BODY FLUID CULTURE W GRAM STAIN
Culture: NO GROWTH
Gram Stain: NONE SEEN

## 2023-09-23 LAB — RHEUMATOID FACTOR: Rheumatoid fact SerPl-aCnc: 11.2 [IU]/mL (ref <14.0)

## 2023-09-23 LAB — CYCLIC CITRUL PEPTIDE ANTIBODY, IGG/IGA: CCP Antibodies IgG/IgA: 8 U (ref 0–19)

## 2023-09-23 LAB — CYTOLOGY - NON PAP

## 2023-09-23 MED ORDER — FUROSEMIDE 40 MG PO TABS
40.0000 mg | ORAL_TABLET | Freq: Every day | ORAL | Status: AC
Start: 2023-09-23 — End: 2024-09-22

## 2023-09-23 MED ORDER — LIDOCAINE 5 % EX PTCH: 1.0000 | MEDICATED_PATCH | CUTANEOUS | Status: AC

## 2023-09-23 MED ORDER — NICOTINE 21 MG/24HR TD PT24: 21.0000 mg | MEDICATED_PATCH | Freq: Every day | TRANSDERMAL | Status: AC

## 2023-09-23 MED ORDER — COLCHICINE 0.6 MG PO TABS: 0.6000 mg | ORAL_TABLET | Freq: Two times a day (BID) | ORAL | Status: AC

## 2023-09-23 MED ORDER — DM-GUAIFENESIN ER 30-600 MG PO TB12: 1.0000 | ORAL_TABLET | Freq: Two times a day (BID) | ORAL | Status: AC | PRN

## 2023-09-23 NOTE — Progress Notes (Signed)
 Patient discharged to SNF. Writer called facility [phone] and gave report to [place] nurse. Patient will be transported by ambulance via stretcher. Patient states he has all belongings.

## 2023-09-23 NOTE — Plan of Care (Signed)
Patient discharged. Goals met

## 2023-09-23 NOTE — TOC Progression Note (Signed)
 Transition of Care [facility]) - Progression Note    Patient Details  Name: Barry Fowler MRN: [id number] Date of Birth: September 04, 1955  [facility]) CM/SW Contact  Quintella Suzen Jansky, RN Phone Number: 09/23/2023, 8:32 AM  Clinical Narrative:    Shara still pending for [facility]    Expected Discharge Plan: Skilled Nursing Facility Barriers to Discharge: Continued Medical Work up  Expected Discharge Plan and Services     Post Acute Care Choice: Skilled Nursing Facility Living arrangements for the past 2 months: Apartment                   DME Agency: NA       HH Arranged: NA           Social Determinants of Health (SDOH) Interventions SDOH Screenings   Food Insecurity: No Food Insecurity (09/16/2023)  Housing: Low Risk  (09/16/2023)  Transportation Needs: No Transportation Needs (09/16/2023)  Utilities: Not At Risk (09/16/2023)  Social Connections: Unknown (09/16/2023)  Tobacco Use: High Risk (09/15/2023)    Readmission Risk Interventions     No data to display

## 2023-09-23 NOTE — TOC Transition Note (Signed)
 Transition of Care [facility]) - Discharge Note   Patient Details  Name: Barry Fowler MRN: [id number] Date of Birth: [birth year]  Transition of Care [facility]) CM/SW Contact:  Quintella Suzen Jansky, RN Phone Number: 09/23/2023, 1:59 PM   Clinical Narrative:    Patient to discharge today, return to [facility]. Discharge summary and orders sent to facility via HUB. Provider nurse with number for report [phone]. TOC spoke with Jinnie at [facility], patient #2 on schedule for pick up. EMS packet printed to nurse station. Notified bedside nurse.    Final next level of care: Skilled Nursing Facility Barriers to Discharge: Barriers Resolved   Patient Goals and CMS Choice Patient states their goals for this hospitalization and ongoing recovery are:: get better CMS Medicare.gov Compare Post Acute Care list provided to:: Patient Choice offered to / list presented to : Patient      Discharge Placement              Patient chooses bed at: [facility] [place] Patient to be transferred to facility by: [facility]   Patient and family notified of of transfer: 09/23/23  Discharge Plan and Services Additional resources added to the After Visit Summary for       Post Acute Care Choice: Skilled Nursing Facility            DME Agency: NA       HH Arranged: NA          Social Drivers of Health (SDOH) Interventions SDOH Screenings   Food Insecurity: No Food Insecurity (09/16/2023)  Housing: Low Risk  (09/16/2023)  Transportation Needs: No Transportation Needs (09/16/2023)  Utilities: Not At Risk (09/16/2023)  Social Connections: Unknown (09/16/2023)  Tobacco Use: High Risk (09/15/2023)     Readmission Risk Interventions     No data to display

## 2023-09-23 NOTE — Discharge Summary (Signed)
 Physician Discharge Summary   Patient: Barry Fowler MRN: [id number] DOB: 03/10/1956  Admit date:     09/14/2023  Discharge date: 09/23/23  Discharge Physician: Amaryllis Dare   PCP: [facility]   Recommendations at discharge:  Please obtain CBC and BMP on follow-up Continue with 2 L of oxygen-wean as tolerated Follow-up with pulmonary Follow-up with cardiology Follow-up with primary care provider  Discharge Diagnoses: Principal Problem:   Acute respiratory failure with hypoxia (HCC) Active Problems:   Pneumonia due to infectious organism   Pericardial effusion   Pleural effusion, left   Hyponatremia   Paroxysmal atrial fibrillation (HCC)   HTN (hypertension)   Alcohol abuse   Tobacco abuse   Hypoxia   Shortness of breath   Hypervolemia   S/P thoracentesis   Hospital Course: Taken from prior notes.  Barry Fowler is a 68 y.o. male with medical history significant of tobacco abuse, alcohol abuse, hypertension, chronic hyponatremia, who presents with SOB.   Patient was recently hospitalized from 6/9 - 6/17 due to severe hyponatremia with sodium 104 which improved to 124 at discharge; also had new onset A-fib, started on metoprolol  not anticoagulant since patient is a poor candidate for anticoagulant use.  Patient also had possible pneumonia which was treated with Rocephin  and doxycycline  in hospital.   Patient was readmitted 2 days later with worsening dyspnea and continue to have cough with little mucus production.  He was found to be hypoxic up to 80% on room air, initially started on nonrebreather and later switched to 5 L of oxygen.  No baseline oxygen use.  Labs with concern of hyponatremia.  Chest x-ray with concern of pulmonary edema and bilateral lower lobe infiltrates.  BNP was elevated at 412.  Patient was started on broad-spectrum antibiotics for concern of HCAP.  Also received Lasix  infusion for a day with IV albumin  due to softer blood pressure  to help with diuresis, due to decrease in UOP on 6/22 Lasix  infusion was held.  Clinically appears euvolemic.  BNP on repeat was 202.  Patient had an echo done on 09/12/2023 which shows normal EF and moderate pericardial effusion, no sign of cardiac tamponade.  IVC was dilated with less than 50% respiratory variability and a mild pulmonary hypertension.  6/23: Vital stable with borderline soft blood pressure and patient continued to require 3 to 4 L of oxygen, continue to have cough and worsening dyspnea.  Sodium at 128.  Leukocytosis has been resolved.  BNP 202 today.  CT chest with contrast was obtained which shows a large pericardial effusion with some pericardial thickening.  Moderate pleural effusions with adjacent lung opacities, left greater than right.  Also has few enlarged and prominent mediastinal nodes which could be reactive but need additional workup like PET scan to rule out any underlying malignancy.  Slight nodular contour of the liver.  Thoracentesis with lab was also ordered.  Cardiology and pulmonary was consulted.  Procalcitonin on the day of this readmission was negative.  Repeating procalcitonin and if remain negative we will discontinue antibiotics.  6/24: Vital stable, procalcitonin remained negative so discontinuing antibiotics, hypoosmolar hyponatremia.  Urine osmolality and sodium elevated as compared to the prior check 2 weeks ago.-Patient is on salt tablets.  S/p thoracentesis with removal of 900 mL of amber-colored pleural fluid.  Labs with  elevated LDH at 116, and protein of 3.5, makes a transudative fluid.  Cultures pending Repeat limited echocardiogram pending.  Starting on Lasix  40 mg daily. Pulmonary is recommending  monitoring bilateral pleural effusions likely will need more drainage from left.  Patient will need outpatient PET scan and EBUS if needed to rule out any underlying malignancy.  6/25: Vital stable, labs with slight worsening of hyponatremia with sodium at  127, elevated inflammatory markers with CRP 9.6 and ESR 70-starting on colchicine  as patient has both pleural and pericardial effusions.  Repeat limited echocardiogram with moderate pericardial effusion mostly posterolateral to left ventricle, no sign of tamponade.  Not amenable to pericardiocentesis at this time per cardiology. Continuing diuresis.  Patient underwent left thoracentesis today  6/26: Hemodynamically stable and clinically improving.  Pulmonary ordered another thoracentesis on right today but there was not enough fluid to drain so it got canceled.  Stable sodium at 127.  Blood pressure borderline soft so holding today's Lasix .  Repeat CT chest with moderate pericardial effusion, persistent right pleural effusion and improvement of left pleural effusion.  Stable mediastinal and hilar lymphadenopathy.  Pulmonary will arrange outpatient PET scan for further evaluation after discharge.  6/27: Hemodynamically stable, sodium at 129 today.  Cardiology has already signed off and patient will continue on p.o. Lasix  on discharge.  Pulmonary will arrange outpatient follow-up and likely a PET scan for further evaluation. Patient continued to require 2 L of oxygen-SNF can monitor and wean as tolerated.  Patient will continue on twice daily dose of colchicine  for next couple of weeks and then can take once a day if needed-outpatient cardiology can monitor and advise.  Patient will continue on current medications and need to have a close follow-up with his providers for further assistance.  Assessment and Plan: * Acute respiratory failure with hypoxia (HCC) Patient with no baseline oxygen use.  Initially required nonrebreather, currently on 2 L of oxygen.  Initial concern of HCAP but procalcitonin was negative.  Chest x-ray on admission with concern of increased interstitial markings. CT chest  with bilateral pleural effusions and large pericardial effusion.  Repeat with improvement of left pleural  effusion, moderate pericardial effusion and stable right pleural effusion-apparently not enough for thoracentesis. - Continue with supplemental oxygen -Wean as tolerated  Pneumonia due to infectious organism Initial concern of HCAP as he was admitted recently with concern of pneumonia and completed a course of antibiotic. MRSA PCR negative.  Initially started on cefepime  and vancomycin  and continued on cefepime  only.  Blood cultures negative.  Urine strep pneumo and Legionella antigen negative. CT chest with contrast today with bilateral pleural effusion and adjacent lung opacities which need further investigation.  Procalcitonin negative -Stopping antibiotics - Continue with supportive care  Pericardial effusion Echocardiogram done on 6/16 with moderate circumferential pericardial effusion.  CT chest today with concern of large pericardial effusions. Patient did receive Lasix  infusion initially which was later discontinued due to decrease in UOP.  .  Renal function seems stable.  Elevated inflammatory markers Repeat limited echo with moderate pericardial effusion, located posterolaterally to left ventricle, not amenable for paracentesis, no sign of tamponade. - Cardiology is on board -Started on colchicine  -IV Lasix  has been switched with p.o.  Pleural effusion, left CT chest today with bilateral pleural effusions left greater than right.  S/p ultrasound-guided left-sided thoracentesis, elevated LDH and protein which makes it transudative fluid.  Procalcitonin negative. Pulmonary was consulted- S/p left thoracentesis twice resulted in resolution of left pleural effusion, persistent moderate right pleural effusion but apparently not enough for thoracentesis - Preliminary cultures with no organism - Cytology pending - Patient likely need outpatient PET scan and EBUS to rule  out any underlying malignancy.   Hyponatremia Seems like having chronic hyponatremia.  Has an history of alcohol  abuse.  Sodium stable at 127 today and patient is on salt tablets.  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  Labs with hypotonic hyponatremia  -Continue with salt tablets -Monitor sodium  Paroxysmal atrial fibrillation (HCC) Currently in sinus rhythm.  Paroxysmal A-fib with RVR. Not on any anticoagulation at this time. - Continue with metoprolol   HTN (hypertension) Blood pressure borderline soft. - Continuing metoprolol   Alcohol abuse Patient did not drink alcohol since discharge to SNF after prior hospitalization. -Continue thiamine  and folic acid   Tobacco abuse Counseling was provided -Nicotine  patch as needed       Consultants: Pulmonary.  Cardiology Procedures performed: Left thoracentesis x 2, attempted right thoracentesis Disposition: Skilled nursing facility Diet recommendation:  Discharge Diet Orders (From admission, onward)     Start     Ordered   09/23/23 0000  Diet - low sodium heart healthy        09/23/23 1330           Cardiac diet DISCHARGE MEDICATION: Allergies as of 09/23/2023   No Known Allergies      Medication List     STOP taking these medications    losartan 25 MG tablet Commonly known as: COZAAR       TAKE these medications    ascorbic acid  500 MG tablet Commonly known as: VITAMIN C  Take 1 tablet (500 mg total) by mouth 2 (two) times daily.   colchicine  0.6 MG tablet Take 1 tablet (0.6 mg total) by mouth 2 (two) times daily for 21 days.   dextromethorphan -guaiFENesin  30-600 MG 12hr tablet Commonly known as: MUCINEX  DM Take 1 tablet by mouth 2 (two) times daily as needed for cough.   feeding supplement Liqd Take 237 mLs by mouth 3 (three) times daily between meals.   folic acid  1 MG tablet Commonly known as: FOLVITE  Take 1 tablet (1 mg total) by mouth daily.   furosemide  40 MG tablet Commonly known as: Lasix  Take 1 tablet (40 mg total) by mouth daily.   lidocaine  5 % Commonly known as: LIDODERM  Place 1  patch onto the skin daily. Remove & Discard patch within 12 hours or as directed by MD Start taking on: September 24, 2023   metoprolol  tartrate 25 MG tablet Commonly known as: LOPRESSOR  Take 1 tablet (25 mg total) by mouth 2 (two) times daily.   multivitamin with minerals Tabs tablet Take 1 tablet by mouth daily.   nicotine  21 mg/24hr patch Commonly known as: NICODERM CQ  - dosed in mg/24 hours Place 1 patch (21 mg total) onto the skin daily. Start taking on: September 24, 2023   nystatin  powder Apply 1 Application topically 2 (two) times daily.   pantoprazole  40 MG tablet Commonly known as: Protonix  Take 1 tablet (40 mg total) by mouth daily.   polyethylene glycol 17 g packet Commonly known as: MIRALAX  / GLYCOLAX  Take 17 g by mouth daily as needed for mild constipation.   pravastatin 20 MG tablet Commonly known as: PRAVACHOL Take 20 mg by mouth daily.   sodium chloride  1 g tablet Take 1 tablet (1 g total) by mouth 3 (three) times daily with meals.   thiamine  100 MG tablet Commonly known as: VITAMIN B1 Take 1 tablet (100 mg total) by mouth daily.        Follow-up Information     [facility], [facility]. Schedule an appointment as soon  as possible for a visit in 1 week(s).   Contact information: [address] [place] [address] [phone]         Isadora Hose, MD. Schedule an appointment as soon as possible for a visit in 1 week(s).   Specialty: Pulmonary Disease Contact information: [address] [place] [address] [phone]         Mady Bruckner, MD. Schedule an appointment as soon as possible for a visit in 1 week(s).   Specialty: Cardiology Contact information: [address] [place] [address] [phone]                Discharge Exam: Fredricka Weights   09/21/23 0500 09/22/23 0500 09/23/23 0500  Weight: 75.8 kg 75 kg 75.3 kg   General.  Frail gentleman, in no acute  distress. Pulmonary.  Lungs clear bilaterally, normal respiratory effort. CV.  Regular rate and rhythm, no JVD, rub or murmur. Abdomen.  Soft, nontender, nondistended, BS positive. CNS.  Alert and oriented .  No focal neurologic deficit. Extremities.  No edema,  pulses intact and symmetrical. Psychiatry.  Judgment and insight appears normal.   Condition at discharge: stable  The results of significant diagnostics from this hospitalization (including imaging, microbiology, ancillary and laboratory) are listed below for reference.   Imaging Studies: US  CHEST (PLEURAL EFFUSION) Result Date: 09/22/2023 CLINICAL DATA:  Limited ultrasound of the chest done prior to possible right thoracentesis. EXAM: CHEST ULTRASOUND COMPARISON:  CT CHEST W CONTRAST - 09/21/23 FINDINGS: There is only a small right pleural effusion present with no window to allow for safe approach for thoracentesis. IMPRESSION: Small right pleural effusion. No window to allow for safe approach for thoracentesis. Procedure performed by: Sherrilee Bal, PA-C Electronically Signed   By: Marcey Moan M.D.   On: 09/22/2023 13:25   CT CHEST W CONTRAST Result Date: 09/22/2023 CLINICAL DATA:  Pleural effusion.  Malignancy suspected. EXAM: CT CHEST WITH CONTRAST TECHNIQUE: Multidetector CT imaging of the chest was performed during intravenous contrast administration. RADIATION DOSE REDUCTION: This exam was performed according to the departmental dose-optimization program which includes automated exposure control, adjustment of the mA and/or kV according to patient size and/or use of iterative reconstruction technique. CONTRAST:  75mL OMNIPAQUE  IOHEXOL  300 MG/ML  SOLN COMPARISON:  09/19/2023 FINDINGS: Cardiovascular: Heart size is normal. Persistent moderate pericardial effusion identified. Aortic atherosclerosis and coronary artery calcifications. Mediastinum/Nodes: Thyroid gland, trachea and esophagus demonstrate no significant findings.  Mediastinal adenopathy is again seen. Index right paratracheal lymph node measures 1.5 cm, image 71/2. Unchanged. Multiple prominent subcarinal lymph nodes are identified. The largest measures 1.2 cm, image 81/2. Also unchanged. Right hilar lymph node measures 1.2 cm, image 80/2. Unchanged. Lungs/Pleura: Central airways are grossly patent. Moderate right pericardial effusion with increasing atelectasis and consolidation within the right lower lobe significant decrease in volume of previous left pleural effusion, now small. There is passive atelectasis noted within the left base. Emphysema with diffuse bronchial wall thickening. Bilateral posterior upper lobe ground-glass attenuation is again noted. There also a few patchy areas of ground-glass attenuation scattered throughout the remaining portions of the upper lobes similar to previous exam. No suspicious lung nodule or mass identified. Upper Abdomen: No acute abnormality. Similar appearance of mild nodular thickening of the adrenal glands with similar 4 mm calcification in the right adrenal gland. No mass or adenopathy identified. Musculoskeletal: Remote healed right lateral rib fractures as before. No acute or suspicious osseous findings. IMPRESSION: 1. Persistent moderate pericardial  effusion. 2. Persistent moderate right pleural effusion with progressive atelectasis and consolidation in the right lower lobe. 3. Interval decrease in volume of prior moderate left pleural effusion status post thoracentesis, now small. There is passive atelectasis within the left lower lobe. 4. Stable mediastinal and right hilar adenopathy. Nonspecific, possibly reactive in the setting of CHF hand or infection. In the absence of a known malignancy, according to consensus criteria, follow-up imaging in 3 months with repeat CT of the chest is advised 5. Stable bilateral upper lobe ground-glass attenuation. Findings may reflect areas of asymmetric edema or atypical infection. 6.  Coronary artery calcifications. 7. Aortic Atherosclerosis (ICD10-I70.0) and Emphysema (ICD10-J43.9). Electronically Signed   By: Waddell Calk M.D.   On: 09/22/2023 04:39   DG Chest Port 1 View Result Date: 09/21/2023 CLINICAL DATA:  Pleural effusion. EXAM: PORTABLE CHEST 1 VIEW COMPARISON:  Chest radiograph dated 09/19/2023. FINDINGS: Small left pleural effusion is slightly decreased since the prior exam. No pneumothorax. Slightly decreased small right pleural effusion with right basilar opacity favored to reflect atelectasis. Stable cardiomegaly. Aortic atherosclerosis. Visualized osseous structures are unchanged. IMPRESSION: 1. Slightly decreased small left pleural effusion.  No pneumothorax. 2. Slightly decreased small right pleural effusion with right basilar opacity favored to reflect atelectasis. Electronically Signed   By: Harrietta Sherry M.D.   On: 09/21/2023 12:34   US  THORACENTESIS ASP PLEURAL SPACE W/IMG GUIDE Result Date: 09/21/2023 INDICATION: 68 year old male admitted for hypoxia with associated pleural effusions, left greater than right. Request received for diagnostic and therapeutic thoracentesis. EXAM: ULTRASOUND GUIDED DIAGNOSTIC AND THERAPEUTIC THORACENTESIS MEDICATIONS: 5 cc of 1% lidocaine  COMPLICATIONS: None immediate. PROCEDURE: An ultrasound guided thoracentesis was thoroughly discussed with the patient and questions answered. The benefits, risks, alternatives and complications were also discussed. The patient understands and wishes to proceed with the procedure. Written consent was obtained. Ultrasound was performed to localize and mark an adequate pocket of fluid in the left chest. The area was then prepped and draped in the normal sterile fashion. 1% Lidocaine  was used for local anesthesia. Under ultrasound guidance a 6 Fr Safe-T-Centesis catheter was introduced. Thoracentesis was performed. The catheter was removed and a dressing applied. FINDINGS: A total of approximately 300  mL of clear, straw-colored pleural fluid was removed. Samples were sent to the laboratory as requested by the clinical team. IMPRESSION: Successful ultrasound guided left thoracentesis yielding 300 cc of pleural fluid. Procedure performed by Carlin Griffon, PA-C Electronically Signed   By: Juliene Balder M.D.   On: 09/21/2023 12:22   ECHOCARDIOGRAM LIMITED Result Date: 09/20/2023    ECHOCARDIOGRAM LIMITED REPORT   Patient Name:   Barry Fowler Date of Exam: 09/20/2023 Medical Rec #:  [id number]   Height:       72.0 in Accession #:    [id number]  Weight:       172.2 lb Date of Birth:  Apr 29, 1955   BSA:          1.999 m Patient Age:    67 years    BP:           140/86 mmHg Patient Gender: M           HR:           77 bpm. Exam Location:  [facility] Procedure: Limited Echo, Color Doppler and Cardiac Doppler (Both Spectral and            Color Flow Doppler were utilized during procedure). Indications:     Pericardial Effusion I31.3  History:  Patient has prior history of Echocardiogram examinations, most                  recent 09/12/2023. Risk Factors:Hypertension.  Sonographer:     Christopher Furnace Referring Phys:  [id number] FLYJFFJI A ARIDA Diagnosing Phys: Lonni Hanson MD IMPRESSIONS  1. Left ventricular ejection fraction, by estimation, is 60 to 65%. The left ventricle has normal function. There is mild left ventricular hypertrophy.  2. Right ventricular systolic function is low normal. The right ventricular size is normal. There is normal pulmonary artery systolic pressure.  3. Right atrial size was mildly dilated.  4. Moderate pericardial effusion. The pericardial effusion is posterior to the left ventricle and lateral to the left ventricle. There is no evidence of cardiac tamponade.  5. The aortic valve is tricuspid.  6. The inferior vena cava is dilated in size with >50% respiratory variability, suggesting right atrial pressure of 8 mmHg. Comparison(s): A prior study was performed on 09/12/2023. Pericardial effusion appears  similar in size but is less circumferential and more localized to the posterolateral left ventricle. FINDINGS  Left Ventricle: Left ventricular ejection fraction, by estimation, is 60 to 65%. The left ventricle has normal function. The left ventricular internal cavity size was normal in size. There is mild left ventricular hypertrophy. Right Ventricle: The right ventricular size is normal. Right ventricular systolic function is low normal. There is normal pulmonary artery systolic pressure. The tricuspid regurgitant velocity is 1.62 m/s, and with an assumed right atrial pressure of 8 mmHg, the estimated right ventricular systolic pressure is 18.5 mmHg. Left Atrium: Left atrial size was normal in size. Right Atrium: Right atrial size was mildly dilated. Pericardium: A moderately sized pericardial effusion is present. The pericardial effusion is posterior to the left ventricle and lateral to the left ventricle. There is no evidence of cardiac tamponade. Tricuspid Valve: The tricuspid valve is not well visualized. Aortic Valve: The aortic valve is tricuspid. Pulmonic Valve: The pulmonic valve was not well visualized. Pulmonic valve regurgitation is not visualized. No evidence of pulmonic stenosis. Aorta: The aortic root is normal in size and structure. Venous: The inferior vena cava is dilated in size with greater than 50% respiratory variability, suggesting right atrial pressure of 8 mmHg. Additional Comments: There is a small pleural effusion in the left lateral region. Spectral Doppler performed. Color Doppler performed.  LEFT VENTRICLE PLAX 2D LVIDd:         3.70 cm LVIDs:         2.70 cm LV PW:         1.20 cm LV IVS:        1.20 cm LVOT diam:     2.00 cm LVOT Area:     3.14 cm  RIGHT VENTRICLE RV Basal diam:  4.00 cm RV Mid diam:    3.40 cm LEFT ATRIUM           Index        RIGHT ATRIUM           Index LA diam:      3.10 cm 1.55 cm/m   RA Area:     23.40 cm LA Vol (A2C): 46.7 ml 23.36 ml/m  RA Volume:    75.00 ml  37.52 ml/m LA Vol (A4C): 16.3 ml 8.15 ml/m   AORTA Ao Root diam: 3.50 cm TRICUSPID VALVE TR Peak grad:   10.5 mmHg TR Vmax:        162.00 cm/s  SHUNTS Systemic Diam: 2.00 cm  Lonni Hanson MD Electronically signed by Lonni Hanson MD Signature Date/Time: 09/20/2023/3:27:51 PM    Final    US  THORACENTESIS ASP PLEURAL SPACE W/IMG GUIDE Addendum Date: 09/20/2023 ADDENDUM REPORT: 09/20/2023 13:19 ADDENDUM: This was a LEFT sided thoracentesis yielding of amber fluid. Electronically Signed   By: Cordella Banner   On: 09/20/2023 13:19   Result Date: 09/20/2023 INDICATION: 68 year old male who presented to the ED with shortness of breath. Imaging concerning for pulmonary edema and possible pneumonia. Request for diagnostic and therapeutic thoracentesis. EXAM: ULTRASOUND GUIDED right THORACENTESIS MEDICATIONS: 1% lidocaine , 8 mL. COMPLICATIONS: None immediate. PROCEDURE: An ultrasound guided thoracentesis was thoroughly discussed with the patient and questions answered. The benefits, risks, alternatives and complications were also discussed. The patient understands and wishes to proceed with the procedure. Written consent was obtained. Ultrasound was performed to localize and mark an adequate pocket of fluid in the right chest. The area was then prepped and draped in the normal sterile fashion. 1% Lidocaine  was used for local anesthesia. Under ultrasound guidance a 6 Fr Safe-T-Centesis catheter was introduced. Thoracentesis was performed. The catheter was removed and a dressing applied. FINDINGS: A total of approximately 900 mL of amber fluid was removed. Samples were sent to the laboratory as requested by the clinical team. IMPRESSION: Successful ultrasound guided right thoracentesis yielding 900 mL of pleural fluid. Procedure performed by: Sherrilee Bal, PA-C under the supervision of Dr. KANDICE Banner Electronically Signed: By: Cordella Banner On: 09/20/2023 07:57   DG Chest Port 1 View Result  Date: 09/19/2023 CLINICAL DATA:  Left pleural effusion status post thoracentesis. EXAM: PORTABLE CHEST 1 VIEW COMPARISON:  Chest x-ray 09/17/2023 FINDINGS: Small left pleural effusion is present, significantly decreased. There is no pneumothorax. Small right pleural effusion has also decreased. There are patchy opacities in the right lung base. The heart is enlarged, unchanged. No acute fractures are seen. IMPRESSION: 1. Small left pleural effusion, significantly decreased. No pneumothorax. 2. Small right pleural effusion, decreased. 3. Patchy opacities in the right lung base, likely atelectasis. Electronically Signed   By: Greig Pique M.D.   On: 09/19/2023 16:58   CT CHEST W CONTRAST Result Date: 09/19/2023 CLINICAL DATA:  Chronic dyspnea of unclear etiology. Recent episode of hyponatremia EXAM: CT CHEST WITH CONTRAST TECHNIQUE: Multidetector CT imaging of the chest was performed during intravenous contrast administration. RADIATION DOSE REDUCTION: This exam was performed according to the departmental dose-optimization program which includes automated exposure control, adjustment of the mA and/or kV according to patient size and/or use of iterative reconstruction technique. CONTRAST:  75mL OMNIPAQUE  IOHEXOL  300 MG/ML  SOLN COMPARISON:  X-ray 09/17/2023. FINDINGS: Cardiovascular: Heart is nonenlarged but there is a large pericardial effusion. Pericardial thickening as well. Please correlate with symptoms and further workup when appropriate. Coronary artery calcifications are seen. Please correlate for other coronary risk factors. The thoracic aorta has a normal course and caliber with some scattered calcified plaque. Mediastinum/Nodes: Preserved thyroid gland. Slightly patulous thoracic esophagus. Question small hiatal hernia. No specific abnormal lymph node enlargement identified in the axillary regions. There is some prominent bilateral hilar nodes. Example on the right has short axis of 10 mm on series 2,  image 82. Smaller on the left. Is also some mediastinal nodes. Example medially anterior to the right main bronchus measures 16 x 22 mm on image 73 of series 2. Again increased from previous. There also some prominent internal mammary chain lymph nodes. Example on the right on image 47 measures 8 by 7 mm. Focus  on the left on image 70 measures a by 6 mm. Lungs/Pleura: Moderate bilateral pleural effusions are seen, left greater than right with adjacent parenchymal lung opacities in the lower lobes. Atelectasis versus infiltrate. There is some dependent opacities as well in the upper lobes which could be more atelectatic. Areas of peripheral interstitial septal thickening identified bilaterally. Emphysematous lung changes identified. Upper Abdomen: Slight nodular contour to the liver. Slight thickening of the adrenal glands bilaterally, nonspecific. Stomach is underdistended. Question some fold thickening. Musculoskeletal: Anasarca. Multiple old right-sided rib fractures. Few left-sided rib fractures as well posteriorly. Scattered degenerative changes. Diffuse breathing motion. IMPRESSION: Large pericardial effusion with some pericardial thickening. Please correlate with any known history such as for pericarditis or dedicated workup is recommended including potential echocardiography. Moderate pleural effusions with adjacent lung opacities, left greater than right. Few enlarged and prominent mediastinal nodes identified including internal mammary chain. These could be reactive but do have a differential recommend short follow-up or additional workup with PET-CT as clinically directed. Slightly nodular contour to the liver. Aortic Atherosclerosis (ICD10-I70.0) and Emphysema (ICD10-J43.9). Findings will be called to the ordering service by the Radiology physician assistant team Electronically Signed   By: Ranell Bring M.D.   On: 09/19/2023 14:33   DG Chest 1 View Result Date: 09/17/2023 CLINICAL DATA:  Hypoxemia  EXAM: CHEST  1 VIEW COMPARISON:  09/14/2023 FINDINGS: LEFT stable enlarged cardiac silhouette. Low lung volumes. Bilateral pleural effusions similar prior. Mild increase in central venous congestion/pulmonary edema. No pneumothorax. IMPRESSION: 1. Mild increase in central venous congestion/pulmonary edema. 2. Stable bilateral pleural effusions. 3. Low lung volumes. Electronically Signed   By: Jackquline Boxer M.D.   On: 09/17/2023 12:02   DG Chest Portable 1 View Result Date: 09/14/2023 EXAM: 1 VIEW XRAY OF THE CHEST 09/14/2023 09:42:12 PM COMPARISON: 09/13/2023 CLINICAL HISTORY: SOB. From [facility]. First day at the facility. Facility called for sudden SHOB, in the 29s on RA. EMS gave NRB at 15L Sats to mid 90s. Was discharged from [facility] for ETOH and fall; Chest Xray today from facility states CHF, bilateral pleural effusions, Bi-basilar atelectasis. FINDINGS: LUNGS AND PLEURA: Small bilateral pleural effusions. Increased interstitial markings in the lungs bilaterally, favoring mild multifocal infection over interstitial edema. Bilateral lower lobe opacities, likely atelectasis. HEART AND MEDIASTINUM: No acute abnormality of the cardiac and mediastinal silhouettes. BONES AND SOFT TISSUES: No acute osseous abnormality. IMPRESSION: 1. Increased interstitial markings in the lungs bilaterally, favoring mild multifocal infection over interstitial edema. 2. Small bilateral pleural effusions. Electronically signed by: Pinkie Pebbles MD 09/14/2023 09:50 PM EDT RP Workstation: [id number]   DG Chest Port 1 View Result Date: 09/13/2023 CLINICAL DATA:  Hypoxia EXAM: PORTABLE CHEST 1 VIEW COMPARISON:  09/09/2023. FINDINGS: Left base consolidation or volume loss with moderate effusion. Pulmonary vascular congestion. Left upper hemithorax alveolar opacity could represent early pneumonia or atelectasis. Aorta is calcified. Old right sided 6-8th rib fractures. IMPRESSION: 1. Left base consolidation or volume loss with  effusion. 2. Left upper hemithorax opacity could represent early pneumonia or atelectasis. Electronically Signed   By: Fonda Field M.D.   On: 09/13/2023 12:11   ECHOCARDIOGRAM COMPLETE Result Date: 09/12/2023    ECHOCARDIOGRAM REPORT   Patient Name:   Barry Fowler Date of Exam: 09/12/2023 Medical Rec #:  [id number]   Height:       72.0 in Accession #:    [id number]  Weight:       188.9 lb Date of Birth:  [birth year]   BSA:  2.080 m Patient Age:    67 years    BP:           128/74 mmHg Patient Gender: M           HR:           75 bpm. Exam Location:  [facility] Procedure: 2D Echo, Cardiac Doppler and Color Doppler (Both Spectral and Color            Flow Doppler were utilized during procedure). Indications:     Atrial Fibrillation I48.91  History:         Patient has no prior history of Echocardiogram examinations.                  Arrythmias:Atrial Fibrillation.  Sonographer:     Thea Norlander RCS Referring Phys:  [id number] CALVIN KATHEE ROBSON Diagnosing Phys: Lonni Hanson MD IMPRESSIONS  1. Left ventricular ejection fraction, by estimation, is 55 to 60%. The left ventricle has normal function. Left ventricular endocardial border not optimally defined to evaluate regional wall motion. Left ventricular diastolic parameters were normal.  2. Right ventricular systolic function is low normal. The right ventricular size is normal. Mildly increased right ventricular wall thickness. There is mildly elevated pulmonary artery systolic pressure. The estimated right ventricular systolic pressure  is 35.6 mmHg.  3. Right atrial size was mildly dilated.  4. Moderate pericardial effusion. The pericardial effusion is circumferential. There is no evidence of cardiac tamponade.  5. The mitral valve is normal in structure. Mild mitral valve regurgitation. No evidence of mitral stenosis.  6. Tricuspid valve regurgitation is mild to moderate.  7. The aortic valve is tricuspid. There is mild thickening of the aortic valve.  Aortic valve regurgitation is not visualized. Aortic valve sclerosis is present, with no evidence of aortic valve stenosis.  8. The inferior vena cava is dilated in size with <50% respiratory variability, suggesting right atrial pressure of 15 mmHg. FINDINGS  Left Ventricle: Left ventricular ejection fraction, by estimation, is 55 to 60%. The left ventricle has normal function. Left ventricular endocardial border not optimally defined to evaluate regional wall motion. The left ventricular internal cavity size was normal in size. There is borderline left ventricular hypertrophy. Left ventricular diastolic parameters were normal. Right Ventricle: The right ventricular size is normal. Mildly increased right ventricular wall thickness. Right ventricular systolic function is low normal. There is mildly elevated pulmonary artery systolic pressure. The tricuspid regurgitant velocity is 2.27 m/s, and with an assumed right atrial pressure of 15 mmHg, the estimated right ventricular systolic pressure is 35.6 mmHg. Left Atrium: Left atrial size was normal in size. Right Atrium: Right atrial size was mildly dilated. Pericardium: A moderately sized pericardial effusion is present. The pericardial effusion is circumferential. There is no evidence of cardiac tamponade. Mitral Valve: The mitral valve is normal in structure. Mild mitral valve regurgitation. No evidence of mitral valve stenosis. Tricuspid Valve: The tricuspid valve is normal in structure. Tricuspid valve regurgitation is mild to moderate. Aortic Valve: The aortic valve is tricuspid. There is mild thickening of the aortic valve. There is mild aortic valve annular calcification. Aortic valve regurgitation is not visualized. Aortic valve sclerosis is present, with no evidence of aortic valve  stenosis. Aortic valve peak gradient measures 3.7 mmHg. Pulmonic Valve: The pulmonic valve was not well visualized. Pulmonic valve regurgitation is trivial. No evidence of pulmonic  stenosis. Aorta: The aortic root and ascending aorta are structurally normal, with no evidence of dilitation. Pulmonary Artery: The  pulmonary artery is not well seen. Venous: The inferior vena cava is dilated in size with less than 50% respiratory variability, suggesting right atrial pressure of 15 mmHg. IAS/Shunts: The interatrial septum was not well visualized. Additional Comments: There is a small pleural effusion in the left lateral region.  LEFT VENTRICLE PLAX 2D LVIDd:         4.40 cm   Diastology LVIDs:         2.90 cm   LV e' medial:    8.16 cm/s LV PW:         1.10 cm   LV E/e' medial:  15.2 LV IVS:        0.90 cm   LV e' lateral:   10.20 cm/s LVOT diam:     2.40 cm   LV E/e' lateral: 12.2 LV SV:         65 LV SV Index:   31 LVOT Area:     4.52 cm  RIGHT VENTRICLE             IVC RV S prime:     12.60 cm/s  IVC diam: 3.15 cm TAPSE (M-mode): 1.8 cm LEFT ATRIUM             Index        RIGHT ATRIUM           Index LA diam:        5.10 cm 2.45 cm/m   RA Area:     20.40 cm LA Vol (A2C):   56.7 ml 27.26 ml/m  RA Volume:   63.30 ml  30.44 ml/m LA Vol (A4C):   58.0 ml 27.89 ml/m LA Biplane Vol: 61.6 ml 29.62 ml/m  AORTIC VALVE AV Area (Vmax): 3.64 cm AV Vmax:        96.50 cm/s AV Peak Grad:   3.7 mmHg LVOT Vmax:      77.60 cm/s LVOT Vmean:     49.300 cm/s LVOT VTI:       0.143 m  AORTA Ao Root diam: 3.70 cm Ao Asc diam:  3.50 cm MITRAL VALVE                TRICUSPID VALVE MV Area (PHT): 5.02 cm     TR Peak grad:   20.6 mmHg MV Decel Time: 151 msec     TR Vmax:        227.00 cm/s MR Peak grad: 80.3 mmHg MR Vmax:      448.00 cm/s   SHUNTS MV E velocity: 124.00 cm/s  Systemic VTI:  0.14 m MV A velocity: 36.20 cm/s   Systemic Diam: 2.40 cm MV E/A ratio:  3.43 Lonni End MD Electronically signed by Lonni Hanson MD Signature Date/Time: 09/12/2023/4:56:25 PM    Final    DG Chest Port 1 View Result Date: 09/09/2023 CLINICAL DATA:  Cough EXAM: PORTABLE CHEST 1 VIEW COMPARISON:  Chest radiograph dated  09/05/2023 FINDINGS: Normal lung volumes. Increased mild patchy right basilar and left upper lung opacities and dense left retrocardiac opacity. Increased small left pleural effusion. Similar trace right pleural effusion. No pneumothorax. Similar enlarged cardiomediastinal silhouette. No acute osseous abnormality. Round metallic BB is again seen projecting over the neck. IMPRESSION: 1. Increased mild patchy right basilar and left upper lung opacities and dense left retrocardiac opacity, which may represent atelectasis or pneumonia. 2. Increased small left pleural effusion. Similar trace right pleural effusion. Electronically Signed   By: Limin  Xu M.D.   On: 09/09/2023 13:20  CT HEAD WO CONTRAST ( ) Result Date: 09/05/2023 CLINICAL DATA:  Fall, head injury EXAM: CT HEAD WITHOUT CONTRAST CT MAXILLOFACIAL WITHOUT CONTRAST CT CERVICAL SPINE WITHOUT CONTRAST TECHNIQUE: Multidetector CT imaging of the head, cervical spine, and maxillofacial structures were performed using the standard protocol without intravenous contrast. Multiplanar CT image reconstructions of the cervical spine and maxillofacial structures were also generated. RADIATION DOSE REDUCTION: This exam was performed according to the departmental dose-optimization program which includes automated exposure control, adjustment of the mA and/or kV according to patient size and/or use of iterative reconstruction technique. COMPARISON:  12/03/2020 FINDINGS: CT HEAD FINDINGS Brain: No evidence of acute infarction, hemorrhage, hydrocephalus, extra-axial collection or mass lesion/mass effect. Periventricular white matter hypodensity. Vascular: No hyperdense vessel or unexpected calcification. CT FACIAL BONES FINDINGS Skull: Normal. Negative for fracture or focal lesion. Facial bones: No displaced fractures or dislocations. Sinuses/Orbits: No acute finding. Other: Multiple soft tissue contusions, including of the right forehead, overlying the right orbit, and  right cheek, as well as the scalp vertex and right parietal scalp (series 2, image 18). Patient is edentulous. CT CERVICAL SPINE FINDINGS Alignment: Straightening of the normal cervical lordosis. Skull base and vertebrae: No acute fracture. No primary bone lesion or focal pathologic process. Soft tissues and spinal canal: No prevertebral fluid or swelling. No visible canal hematoma. Disc levels: Moderate multilevel cervical disc degenerative disease. Upper chest: Negative. Other: None. IMPRESSION: 1. No acute intracranial pathology. Small-vessel white matter disease. 2. No displaced fractures or dislocations of the facial bones. 3. Multiple soft tissue contusions, including of the right forehead, overlying the right orbit, and right cheek, as well as the scalp vertex and right parietal scalp. 4. No fracture or static subluxation of the cervical spine. 5. Moderate multilevel cervical disc degenerative disease. Electronically Signed   By: Marolyn JONETTA Jaksch M.D.   On: 09/05/2023 19:54   CT Maxillofacial Wo Contrast Result Date: 09/05/2023 CLINICAL DATA:  Fall, head injury EXAM: CT HEAD WITHOUT CONTRAST CT MAXILLOFACIAL WITHOUT CONTRAST CT CERVICAL SPINE WITHOUT CONTRAST TECHNIQUE: Multidetector CT imaging of the head, cervical spine, and maxillofacial structures were performed using the standard protocol without intravenous contrast. Multiplanar CT image reconstructions of the cervical spine and maxillofacial structures were also generated. RADIATION DOSE REDUCTION: This exam was performed according to the departmental dose-optimization program which includes automated exposure control, adjustment of the mA and/or kV according to patient size and/or use of iterative reconstruction technique. COMPARISON:  12/03/2020 FINDINGS: CT HEAD FINDINGS Brain: No evidence of acute infarction, hemorrhage, hydrocephalus, extra-axial collection or mass lesion/mass effect. Periventricular white matter hypodensity. Vascular: No  hyperdense vessel or unexpected calcification. CT FACIAL BONES FINDINGS Skull: Normal. Negative for fracture or focal lesion. Facial bones: No displaced fractures or dislocations. Sinuses/Orbits: No acute finding. Other: Multiple soft tissue contusions, including of the right forehead, overlying the right orbit, and right cheek, as well as the scalp vertex and right parietal scalp (series 2, image 18). Patient is edentulous. CT CERVICAL SPINE FINDINGS Alignment: Straightening of the normal cervical lordosis. Skull base and vertebrae: No acute fracture. No primary bone lesion or focal pathologic process. Soft tissues and spinal canal: No prevertebral fluid or swelling. No visible canal hematoma. Disc levels: Moderate multilevel cervical disc degenerative disease. Upper chest: Negative. Other: None. IMPRESSION: 1. No acute intracranial pathology. Small-vessel white matter disease. 2. No displaced fractures or dislocations of the facial bones. 3. Multiple soft tissue contusions, including of the right forehead, overlying the right orbit, and right cheek, as well as the  scalp vertex and right parietal scalp. 4. No fracture or static subluxation of the cervical spine. 5. Moderate multilevel cervical disc degenerative disease. Electronically Signed   By: Marolyn JONETTA Jaksch M.D.   On: 09/05/2023 19:54   CT Cervical Spine Wo Contrast Result Date: 09/05/2023 CLINICAL DATA:  Fall, head injury EXAM: CT HEAD WITHOUT CONTRAST CT MAXILLOFACIAL WITHOUT CONTRAST CT CERVICAL SPINE WITHOUT CONTRAST TECHNIQUE: Multidetector CT imaging of the head, cervical spine, and maxillofacial structures were performed using the standard protocol without intravenous contrast. Multiplanar CT image reconstructions of the cervical spine and maxillofacial structures were also generated. RADIATION DOSE REDUCTION: This exam was performed according to the departmental dose-optimization program which includes automated exposure control, adjustment of the mA  and/or kV according to patient size and/or use of iterative reconstruction technique. COMPARISON:  12/03/2020 FINDINGS: CT HEAD FINDINGS Brain: No evidence of acute infarction, hemorrhage, hydrocephalus, extra-axial collection or mass lesion/mass effect. Periventricular white matter hypodensity. Vascular: No hyperdense vessel or unexpected calcification. CT FACIAL BONES FINDINGS Skull: Normal. Negative for fracture or focal lesion. Facial bones: No displaced fractures or dislocations. Sinuses/Orbits: No acute finding. Other: Multiple soft tissue contusions, including of the right forehead, overlying the right orbit, and right cheek, as well as the scalp vertex and right parietal scalp (series 2, image 18). Patient is edentulous. CT CERVICAL SPINE FINDINGS Alignment: Straightening of the normal cervical lordosis. Skull base and vertebrae: No acute fracture. No primary bone lesion or focal pathologic process. Soft tissues and spinal canal: No prevertebral fluid or swelling. No visible canal hematoma. Disc levels: Moderate multilevel cervical disc degenerative disease. Upper chest: Negative. Other: None. IMPRESSION: 1. No acute intracranial pathology. Small-vessel white matter disease. 2. No displaced fractures or dislocations of the facial bones. 3. Multiple soft tissue contusions, including of the right forehead, overlying the right orbit, and right cheek, as well as the scalp vertex and right parietal scalp. 4. No fracture or static subluxation of the cervical spine. 5. Moderate multilevel cervical disc degenerative disease. Electronically Signed   By: Marolyn JONETTA Jaksch M.D.   On: 09/05/2023 19:54   DG Chest Portable 1 View Result Date: 09/05/2023 CLINICAL DATA:  fall EXAM: PORTABLE CHEST - 1 VIEW COMPARISON:  12/03/2020 FINDINGS: New left retrocardiac opacity.  Right lung clear. Heart size and mediastinal contours are within normal limits. Aortic Atherosclerosis (ICD10-170.0). No effusion. Old right lateral rib  fracture deformities. 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 IMPRESSION: New left retrocardiac opacity. Electronically Signed   By: JONETTA Faes M.D.   On: 09/05/2023 19:53   DG Elbow 2 Views Right Result Date: 09/05/2023 CLINICAL DATA:  Recent fall with right elbow pain, initial encounter EXAM: RIGHT ELBOW - 2 VIEW COMPARISON:  None Available. FINDINGS: There is no evidence of fracture, dislocation, or joint effusion. There is no evidence of arthropathy or other focal bone abnormality. Soft tissues are unremarkable. IMPRESSION: No acute abnormality noted. Electronically Signed   By: Oneil Devonshire M.D.   On: 09/05/2023 19:47    Microbiology: Results for orders placed or performed during the hospital encounter of 09/14/23  Resp panel by RT-PCR (RSV, Flu A&B, Covid) Anterior Nasal Swab     Status: None   Collection Time: 09/14/23 10:53 PM   Specimen: Anterior Nasal Swab  Result Value Ref Range Status   SARS Coronavirus 2 by RT PCR NEGATIVE NEGATIVE Final    Comment: (NOTE) SARS-CoV-2 target nucleic acids are NOT DETECTED.  The SARS-CoV-2 RNA is generally detectable in upper respiratory specimens  during the acute phase of infection. The lowest concentration of SARS-CoV-2 viral copies this assay can detect is 138 copies/mL. A negative result does not preclude SARS-Cov-2 infection and should not be used as the sole basis for treatment or other patient management decisions. A negative result may occur with  improper specimen collection/handling, submission of specimen other than nasopharyngeal swab, presence of viral mutation(s) within the areas targeted by this assay, and inadequate number of viral copies(<138 copies/mL). A negative result must be combined with clinical observations, patient history, and epidemiological information. The expected result is Negative.  Fact Sheet for Patients:  BloggerCourse.com  Fact Sheet for Healthcare Providers:   SeriousBroker.it  This test is no t yet approved or cleared by the [place]  FDA and  has been authorized for detection and/or diagnosis of SARS-CoV-2 by FDA under an Emergency Use Authorization (EUA). This EUA will remain  in effect (meaning this test can be used) for the duration of the COVID-19 declaration under Section 564(b)(1) of the Act, 21 U.S.C.section 360bbb-3(b)(1), unless the authorization is terminated  or revoked sooner.       Influenza A by PCR NEGATIVE NEGATIVE Final   Influenza B by PCR NEGATIVE NEGATIVE Final    Comment: (NOTE) The Xpert Xpress SARS-CoV-2/FLU/RSV plus assay is intended as an aid in the diagnosis of influenza from Nasopharyngeal swab specimens and should not be used as a sole basis for treatment. Nasal washings and aspirates are unacceptable for Xpert Xpress SARS-CoV-2/FLU/RSV testing.  Fact Sheet for Patients: BloggerCourse.com  Fact Sheet for Healthcare Providers: SeriousBroker.it  This test is not yet approved or cleared by the [place]  FDA and has been authorized for detection and/or diagnosis of SARS-CoV-2 by FDA under an Emergency Use Authorization (EUA). This EUA will remain in effect (meaning this test can be used) for the duration of the COVID-19 declaration under Section 564(b)(1) of the Act, 21 U.S.C. section 360bbb-3(b)(1), unless the authorization is terminated or revoked.     Resp Syncytial Virus by PCR NEGATIVE NEGATIVE Final    Comment: (NOTE) Fact Sheet for Patients: BloggerCourse.com  Fact Sheet for Healthcare Providers: SeriousBroker.it  This test is not yet approved or cleared by the [place]  FDA and has been authorized for detection and/or diagnosis of SARS-CoV-2 by FDA under an Emergency Use Authorization (EUA). This EUA will remain in effect (meaning this test can be used) for  the duration of the COVID-19 declaration under Section 564(b)(1) of the Act, 21 U.S.C. section 360bbb-3(b)(1), unless the authorization is terminated or revoked.  Performed at [facility], [address] [place], [place] [address]   Blood culture (routine x 2)     Status: None   Collection Time: 09/14/23 10:53 PM   Specimen: BLOOD  Result Value Ref Range Status   Specimen Description BLOOD LEFT HAND  Final   Special Requests   Final    BOTTLES DRAWN AEROBIC AND ANAEROBIC Blood Culture results may not be optimal due to an inadequate volume of blood received in culture bottles   Culture   Final    NO GROWTH 5 DAYS Performed at [facility], [address], [place], [place] [address]    Report Status 09/19/2023 FINAL  Final  Blood culture (routine x 2)     Status: None   Collection Time: 09/14/23 11:19 PM   Specimen: BLOOD  Result Value Ref Range Status   Specimen Description BLOOD BLOOD RIGHT ARM  Final   Special Requests   Final  BOTTLES DRAWN AEROBIC AND ANAEROBIC Blood Culture results may not be optimal due to an inadequate volume of blood received in culture bottles   Culture   Final    NO GROWTH 5 DAYS Performed at [facility], [address], [place], [place] [address]    Report Status 09/19/2023 FINAL  Final  MRSA Next Gen by PCR, Nasal     Status: None   Collection Time: 09/15/23 10:22 AM   Specimen: Nasal Mucosa; Nasal Swab  Result Value Ref Range Status   MRSA by PCR Next Gen NOT DETECTED NOT DETECTED Final    Comment: (NOTE) The GeneXpert MRSA Assay (FDA approved for NASAL specimens only), is one component of a comprehensive MRSA colonization surveillance program. It is not intended to diagnose MRSA infection nor to guide or monitor treatment for MRSA infections. Test performance is not FDA approved in patients less than 69 years old. Performed at [facility], [address] [place], [place] [address]   Expectorated  Sputum Assessment w Gram Stain, Rflx to Resp Cult     Status: None   Collection Time: 09/15/23  6:40 PM   Specimen: Sputum  Result Value Ref Range Status   Specimen Description SPUTUM  Final   Special Requests EXPSU  Final   Sputum evaluation   Final    Sputum specimen not acceptable for testing.  Please recollect.   RESULT CALLED TO, READ BACK BY AND VERIFIED WITH: RYAN AINSWORTH @2027  ON 09/15/23 SKL Performed at [facility], [address] [place], [place] [address]    Report Status 09/15/2023 FINAL  Final  Body fluid culture w Gram Stain     Status: None (Preliminary result)   Collection Time: 09/19/23  3:57 PM   Specimen: Pleura; Body Fluid  Result Value Ref Range Status   Specimen Description   Final    PLEURAL Performed at [facility], [address], [place], [place] [address]    Special Requests   Final    NONE Performed at [facility], [address] [place], [place] [address]    Gram Stain NO WBC SEEN NO ORGANISMS SEEN   Final   Culture   Final    NO GROWTH 2 DAYS Performed at [facility], [address], [place], [place] [address]    Report Status PENDING  Incomplete    Labs: CBC: Recent Labs  Lab 09/17/23 0442 09/18/23 0540 09/19/23 0147  WBC 14.2* 10.6* 9.0  HGB 10.6* 10.2* 9.5*  HCT 30.8* 28.8* 28.0*  MCV 89.0 87.0 89.5  PLT 371 390 380   Basic Metabolic Panel: Recent Labs  Lab 09/17/23 0442 09/17/23 0947 09/18/23 0540 09/18/23 1619 09/19/23 0147 09/21/23 0439 09/22/23 0508 09/23/23 0425  NA 122*   < > 125* 128* 128* 127* 127* 129*  K 4.0   < > 3.3* 4.1 4.4 3.8 4.1 4.3  CL 91*   < > 88* 89* 91* 91* 91* 93*  CO2 23   < > 27 27 27 25 28 27   GLUCOSE 127*   < > 136* 131* 130* 118* 115* 127*  BUN 12   < > 9 15 15 13 15 12   CREATININE 0.47*   < > 0.42* 0.57* 0.51* 0.48* 0.43* 0.47*  CALCIUM  8.0*   < > 8.3* 8.4* 8.8* 8.8* 8.7* 8.9  MG 1.9  --  1.6*  --   --   --   --   --    < > =  values in this  interval not displayed.   Liver Function Tests: No results for input(s): AST, ALT, ALKPHOS, BILITOT, PROT, ALBUMIN  in the last 168 hours. CBG: No results for input(s): GLUCAP in the last 168 hours.  Discharge time spent: greater than 30 minutes.  This record has been created using Conservation officer, historic buildings. Errors have been sought and corrected,but may not always be located. Such creation errors do not reflect on the standard of care.   Signed: Amaryllis Dare, MD [facility] 09/23/2023

## 2023-09-23 NOTE — TOC Progression Note (Signed)
 Transition of Care [facility]) - Progression Note    Patient Details  Name: Barry Fowler MRN: [id number] Date of Birth: [birth year]  Transition of Care [facility]) CM/SW Contact  Quintella Suzen Jansky, RN Phone Number: 09/23/2023, 1:21 PM  Clinical Narrative:     Patient received auth approval for [employer], Approved PlanAuthID: [id number] Dates: 6.26-6.30.2025 Next REview Date: 6.30.2025. Facility is able to accept today. Notified MD.   Expected Discharge Plan: Skilled Nursing Facility Barriers to Discharge: Continued Medical Work up  Expected Discharge Plan and Services     Post Acute Care Choice: Skilled Nursing Facility Living arrangements for the past 2 months: Apartment                   DME Agency: NA       HH Arranged: NA           Social Determinants of Health (SDOH) Interventions SDOH Screenings   Food Insecurity: No Food Insecurity (09/16/2023)  Housing: Low Risk  (09/16/2023)  Transportation Needs: No Transportation Needs (09/16/2023)  Utilities: Not At Risk (09/16/2023)  Social Connections: Unknown (09/16/2023)  Tobacco Use: High Risk (09/15/2023)    Readmission Risk Interventions     No data to display

## 2023-09-26 DIAGNOSIS — J9600 Acute respiratory failure, unspecified whether with hypoxia or hypercapnia: Secondary | ICD-10-CM | POA: Diagnosis not present

## 2023-09-27 ENCOUNTER — Ambulatory Visit (INDEPENDENT_AMBULATORY_CARE_PROVIDER_SITE_OTHER): Admitting: Student in an Organized Health Care Education/Training Program

## 2023-09-27 ENCOUNTER — Encounter: Payer: Self-pay | Admitting: Student in an Organized Health Care Education/Training Program

## 2023-09-27 VITALS — BP 118/72 | HR 71 | Temp 96.9°F | Ht 72.0 in | Wt 166.0 lb

## 2023-09-27 DIAGNOSIS — F17210 Nicotine dependence, cigarettes, uncomplicated: Secondary | ICD-10-CM | POA: Diagnosis not present

## 2023-09-27 DIAGNOSIS — R590 Localized enlarged lymph nodes: Secondary | ICD-10-CM | POA: Diagnosis not present

## 2023-09-27 DIAGNOSIS — J90 Pleural effusion, not elsewhere classified: Secondary | ICD-10-CM | POA: Diagnosis not present

## 2023-09-27 NOTE — Progress Notes (Unsigned)
 Synopsis: Referred in *** by [facility] [employer] [facility] [employer]*  Assessment & Plan:   1. Pleural effusion, left (Primary)  Resolved on US , follow up with contrasted chest CT. Likely serositis secondary to pericardial effusion. 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 He has follow up with cardiology.  - NM PET Image Initial (PI) Skull Base To Thigh (F-18 FDG); Future - CT CHEST W CONTRAST; Future  2. 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 Will repeat CT and PET/CT in one month. - NM PET Image Initial (PI) Skull Base To Thigh (F-18 FDG); Future - CT CHEST W CONTRAST; Future   Return in about 6 weeks (around 11/08/2023).  I spent *** minutes caring for this patient today, including {EM billing:28027}  Belva November, MD [facility] 09/27/2023 11:30 AM    End of visit medications:  No orders of the defined types were placed in this encounter.    Current Outpatient Medications:    ascorbic acid  (VITAMIN C ) 500 MG tablet, Take 1 tablet (500 mg total) by mouth 2 (two) times daily., Disp: , Rfl:    colchicine  0.6 MG tablet, Take 1 tablet (0.6 mg total) by mouth 2 (two) times daily for 21 days., Disp: , Rfl:    dextromethorphan -guaiFENesin  (MUCINEX  DM) 30-600 MG 12hr tablet, Take 1 tablet by mouth 2 (two) times daily as needed for cough., Disp: , Rfl:    feeding supplement (ENSURE PLUS HIGH PROTEIN) LIQD, Take 237 mLs by mouth 3 (three) times daily between meals., Disp: , Rfl:    folic acid  (FOLVITE ) 1 MG tablet, Take 1 tablet (1 mg total) by mouth daily., Disp: , Rfl:    furosemide  (LASIX ) 40 MG tablet, Take 1 tablet (40 mg total) by mouth daily., Disp: , Rfl:    lidocaine  (LIDODERM ) 5 %, Place 1 patch onto the skin daily. Remove & Discard patch within 12 hours or as directed by MD, Disp: , Rfl:    metoprolol  tartrate (LOPRESSOR ) 25 MG tablet, Take 1 tablet (25 mg total) by mouth 2 (two) times daily., Disp: , Rfl:     Multiple Vitamin (MULTIVITAMIN WITH MINERALS) TABS tablet, Take 1 tablet by mouth daily., Disp: 30 tablet, Rfl: 0   nicotine  (NICODERM CQ  - DOSED IN MG/24 HOURS) 21 mg/24hr patch, Place 1 patch (21 mg total) onto the skin daily., Disp: , Rfl:    nystatin  powder, Apply 1 Application topically 2 (two) times daily., Disp: , Rfl:    pantoprazole  (PROTONIX ) 40 MG tablet, Take 1 tablet (40 mg total) by mouth daily., Disp: , Rfl:    polyethylene glycol (MIRALAX  / GLYCOLAX ) 17 g packet, Take 17 g by mouth daily as needed for mild constipation., Disp: 14 each, Rfl: 0   pravastatin (PRAVACHOL) 20 MG tablet, Take 20 mg by mouth daily., Disp: , Rfl:    sodium chloride  1 g tablet, Take 1 tablet (1 g total) by mouth 3 (three) times daily with meals., Disp: , Rfl:    thiamine  100 MG tablet, Take 1 tablet (100 mg total) by mouth daily., Disp: 30 tablet, Rfl: 0   Subjective:   PATIENT ID: Barry Fowler GENDER: male DOB: Apr 15, 1955, MRN: [id number]  Chief Complaint  Patient presents with   Consult    No Sob, wheezing or cough.    HPI ***  Ancillary information including prior medications, full medical/surgical/family/social histories, and PFTs (when available) are listed below and have been reviewed.   ROS   Objective:   Vitals:  09/27/23 1110  BP: 118/72  Pulse: 71  Temp: (!) 96.9 F (36.1 C)  SpO2: 95%  Weight: 166 lb (75.3 kg)  Height: 6' (1.829 m)   95% on *** LPM *** RA BMI Readings from Last 3 Encounters:  09/27/23 22.51 kg/m  09/23/23 22.51 kg/m  09/13/23 25.74 kg/m   Wt Readings from Last 3 Encounters:  09/27/23 166 lb (75.3 kg)  09/23/23 166 lb 0.1 oz (75.3 kg)  09/13/23 189 lb 13.1 oz (86.1 kg)    Physical Exam    Ancillary Information    Past Medical History:  Diagnosis Date   Chronic hyponatremia    Hypertension    Paroxysmal atrial fibrillation (HCC)    Pericardial effusion 08/2023     Family History  Problem Relation Age of Onset   Heart attack Father       Past Surgical History:  Procedure Laterality Date   EYE SURGERY      Social History   Socioeconomic History   Marital status: Single    Spouse name: Not on file   Number of children: Not on file   Years of education: Not on file   Highest education level: Not on file  Occupational History   Not on file  Tobacco Use   Smoking status: Every Day    Current packs/day: 1.00    Average packs/day: 1 pack/day for 42.0 years (42.0 ttl pk-yrs)    Types: Cigarettes   Smokeless tobacco: Never   Tobacco comments:    10 cigarettes a day- khj 09/27/2023        Started smoking in his late 20's    Smoked 1PPD at his heaviest  Substance and Sexual Activity   Alcohol use: Yes    Comment: beer daily   Drug use: Never   Sexual activity: Not on file  Other Topics Concern   Not on file  Social History Narrative   Not on file   Social Drivers of Health   Financial Resource Strain: Not on file  Food Insecurity: No Food Insecurity (09/16/2023)   Hunger Vital Sign    Worried About Running Out of Food in the Last Year: Never true    Ran Out of Food in the Last Year: Never true  Transportation Needs: No Transportation Needs (09/16/2023)   PRAPARE - Administrator, Civil Service (Medical): No    Lack of Transportation (Non-Medical): No  Physical Activity: Not on file  Stress: Not on file  Social Connections: Unknown (09/16/2023)   Social Connection and Isolation Panel    Frequency of Communication with Friends and Family: Never    Frequency of Social Gatherings with Friends and Family: Never    Attends Religious Services: Never    Database administrator or Organizations: Patient declined    Attends Banker Meetings: Never    Marital Status: Patient declined  Intimate Partner Violence: Not At Risk (09/16/2023)   Humiliation, Afraid, Rape, and Kick questionnaire    Fear of Current or Ex-Partner: No    Emotionally Abused: No    Physically Abused: No    Sexually  Abused: No     No Known Allergies   CBC    Component Value Date/Time   WBC 9.0 09/19/2023 0147   RBC 3.13 (L) 09/19/2023 0147   HGB 9.5 (L) 09/19/2023 0147   HGB 14.9 03/24/2013 2105   HCT 28.0 (L) 09/19/2023 0147   HCT 45.9 03/24/2013 2105   PLT 380 09/19/2023 0147  PLT 243 03/24/2013 2105   MCV 89.5 09/19/2023 0147   MCV 91 03/24/2013 2105   MCH 30.4 09/19/2023 0147   MCHC 33.9 09/19/2023 0147   RDW 14.4 09/19/2023 0147   RDW 13.8 03/24/2013 2105   LYMPHSABS 1.6 09/14/2023 2055   MONOABS 1.3 (H) 09/14/2023 2055   EOSABS 0.1 09/14/2023 2055   BASOSABS 0.0 09/14/2023 2055    Pulmonary Functions Testing Results:     No data to display          Outpatient Medications Prior to Visit  Medication Sig Dispense Refill   ascorbic acid  (VITAMIN C ) 500 MG tablet Take 1 tablet (500 mg total) by mouth 2 (two) times daily.     colchicine  0.6 MG tablet Take 1 tablet (0.6 mg total) by mouth 2 (two) times daily for 21 days.     dextromethorphan -guaiFENesin  (MUCINEX  DM) 30-600 MG 12hr tablet Take 1 tablet by mouth 2 (two) times daily as needed for cough.     feeding supplement (ENSURE PLUS HIGH PROTEIN) LIQD Take 237 mLs by mouth 3 (three) times daily between meals.     folic acid  (FOLVITE ) 1 MG tablet Take 1 tablet (1 mg total) by mouth daily.     furosemide  (LASIX ) 40 MG tablet Take 1 tablet (40 mg total) by mouth daily.     lidocaine  (LIDODERM ) 5 % Place 1 patch onto the skin daily. Remove & Discard patch within 12 hours or as directed by MD     metoprolol  tartrate (LOPRESSOR ) 25 MG tablet Take 1 tablet (25 mg total) by mouth 2 (two) times daily.     Multiple Vitamin (MULTIVITAMIN WITH MINERALS) TABS tablet Take 1 tablet by mouth daily. 30 tablet 0   nicotine  (NICODERM CQ  - DOSED IN MG/24 HOURS) 21 mg/24hr patch Place 1 patch (21 mg total) onto the skin daily.     nystatin  powder Apply 1 Application topically 2 (two) times daily.     pantoprazole  (PROTONIX ) 40 MG tablet Take 1  tablet (40 mg total) by mouth daily.     polyethylene glycol (MIRALAX  / GLYCOLAX ) 17 g packet Take 17 g by mouth daily as needed for mild constipation. 14 each 0   pravastatin (PRAVACHOL) 20 MG tablet Take 20 mg by mouth daily.     sodium chloride  1 g tablet Take 1 tablet (1 g total) by mouth 3 (three) times daily with meals.     thiamine  100 MG tablet Take 1 tablet (100 mg total) by mouth daily. 30 tablet 0   No facility-administered medications prior to visit.

## 2023-09-28 ENCOUNTER — Ambulatory Visit: Admitting: Medical

## 2023-09-29 DIAGNOSIS — I10 Essential (primary) hypertension: Secondary | ICD-10-CM | POA: Diagnosis not present

## 2023-09-29 DIAGNOSIS — E440 Moderate protein-calorie malnutrition: Secondary | ICD-10-CM | POA: Diagnosis not present

## 2023-09-29 DIAGNOSIS — S51011A Laceration without foreign body of right elbow, initial encounter: Secondary | ICD-10-CM | POA: Diagnosis not present

## 2023-09-29 DIAGNOSIS — J9600 Acute respiratory failure, unspecified whether with hypoxia or hypercapnia: Secondary | ICD-10-CM | POA: Diagnosis not present

## 2023-09-29 DIAGNOSIS — I480 Paroxysmal atrial fibrillation: Secondary | ICD-10-CM | POA: Diagnosis not present

## 2023-09-29 DIAGNOSIS — E871 Hypo-osmolality and hyponatremia: Secondary | ICD-10-CM | POA: Diagnosis not present

## 2023-09-29 DIAGNOSIS — F17200 Nicotine dependence, unspecified, uncomplicated: Secondary | ICD-10-CM | POA: Diagnosis not present

## 2023-10-04 DIAGNOSIS — I10 Essential (primary) hypertension: Secondary | ICD-10-CM | POA: Diagnosis not present

## 2023-10-04 DIAGNOSIS — J9600 Acute respiratory failure, unspecified whether with hypoxia or hypercapnia: Secondary | ICD-10-CM | POA: Diagnosis not present

## 2023-10-04 DIAGNOSIS — E871 Hypo-osmolality and hyponatremia: Secondary | ICD-10-CM | POA: Diagnosis not present

## 2023-10-04 NOTE — Progress Notes (Unsigned)
 Cardiology Clinic Note   Date: 10/04/2023 ID: Tracker, Mance May 05, 1955, MRN [id number]  Primary Cardiologist:  None  Chief Complaint   Barry Fowler is a 68 y.o. male who presents to the clinic today for ***  Patient Profile   Barry Fowler is followed by Dr. Mady for the history outlined below.      Past medical history significant for: Pericardial effusion. Echo 09/12/2023: EF 55 to 60%.  Regional wall motion unable to be evaluated.  Normal diastolic parameters.  Low normal RV function.  Normal RV size.  Mildly increased right ventricular wall thickness.  Mildly elevated PA pressure, RVSP 35.6 mmHg.  Mild RAE.  Moderate circumferential pericardial effusion with no evidence of cardiac tamponade.  Mild MR.  Aortic valve sclerosis without stenosis.  Dilated IVC, RA pressure 15 mmHg. Limited echo 09/20/2023: EF 60 to 65%.  Mild LVH.  Low normal RV function, normal RV size.  Normal PA pressure.  Mild RAE.  Moderate pericardial effusion posterior to the left ventricle and lateral to the left ventricle without tamponade.  Dilated IVC, RA pressure 8 mmHg.  Pericardial effusion is similar in size but less circumferential and more localized to posterior lateral left ventricle. PAF. Onset June 2025. Hypertension. Alcohol use. Tobacco abuse.  In summary, patient presented to the ED via EMS on 09/05/2023 after being found on the floor by his sister.  Patient had evidence of bruises including an older hematoma of the right eye from a fall several days prior.  BP upon EMS arrival 80/40.  He received a 500 mL normal saline bolus prior to arrival.  At the time of triage BP 128/78.  Patient sister reported he is an alcoholic and was on an alcohol binge.  Patient denied self-harm.  Initial labs: WBC 13.9, hemoglobin 12.1, sodium 104, potassium 3.7, creatinine 0.51, BUN 9, calcium  7.5, AST 123, ALT 42, magnesium  1.8, CK 3815, negative drug screen.  Patient was admitted to the ICU and sodium was slowly corrected.   Patient went into A-fib on 09/10/2023 and cardiology was consulted.  Patient was asymptomatic.  Oral metoprolol  was added for rate control.  It was felt he was not a good candidate for amiodarone or oral anticoagulation.  He was started on IV heparin .  TSH and electrolytes are stable.  Patient converted to sinus rhythm in the early morning hours on 6/16.  Echo demonstrated normal LV function with moderate circumferential pericardial effusion (detailed above).  He was provided with a one-time dose of IV Lasix .  And follow up limited echo recommended in 48 hours.  Patient was discharged on 09/13/2023 to SNF.  Patient returned to the ED on 09/14/2023 from SNF secondary to sudden onset of shortness of breath with SpO2 in the 80s on room air.  Facility performed x-ray which was read as CHF, bilateral pleural effusions, bibasilar atelectasis.  Initial labs: WBC 12.1, hemoglobin 10.9, hematocrit 31.5, sodium 126, potassium 4, creatinine 0.62, BUN 9, BNP 412, respiratory panel negative.  EKG showed sinus rhythm.  Chest x-ray demonstrated increased interstitial markings in the lungs bilaterally favoring mild multifocal infection over interstitial edema, small bilateral pleural effusions.  Patient underwent right thoracentesis on 09/19/2023 and left thoracentesis on 09/21/2023.  CT chest demonstrated large pericardial effusion with pericardial thickening.  Limited echo demonstrated moderate pericardial effusion less circumferential and more localized to posterior lateral left ventricle as detailed above.  Pericardiocentesis was not indicated.  Recommendation for gentle diuresis.  Sed rate and CRP were elevated and patient was  started on colchicine .  Patient was discharged on 09/23/2023.     History of Present Illness    Today, patient ***  Pericardial effusion Echo 09/12/2023 showed moderate circumferential pericardial effusion with no evidence of cardiac tamponade.  Repeat limited echo 09/20/2023 showed moderate  pericardial effusion in the left ventricle and lateral to the left ventricle without tamponade felt to be similar in size but less circumferential more localized to posterior lateral left ventricle.  Patient*** - Continue Lasix , colchicine . - Sed rate and CRP*** - Repeat limited echo***  PAF Onset June 2025 in the setting of alcohol binge.  Not a candidate for oral anticoagulation secondary to alcohol abuse and frequent falls.  Patient***EKG*** - Continue metoprolol . -***  Hypertension BP today*** - Continue metoprolol .  Tobacco abuse Patient***  Alcohol abuse Patient***  ROS: All other systems reviewed and are otherwise negative except as noted in History of Present Illness.  EKGs/Labs Reviewed        09/05/2023: ALT 42; AST 123 09/23/2023: BUN 12; Creatinine, Ser 0.47; Potassium 4.3; Sodium 129   09/19/2023: Hemoglobin 9.5; WBC 9.0   09/10/2023: TSH 1.259   09/19/2023: B Natriuretic Peptide 202.9  ***  Risk Assessment/Calculations    {Does this patient have ATRIAL FIBRILLATION?[phone]} No BP recorded.  {Refresh Note OR Click here to enter BP  :1}***        Physical Exam    VS:  There were no vitals taken for this visit. , BMI There is no height or weight on file to calculate BMI.  GEN: Well nourished, well developed, in no acute distress. Neck: No JVD or carotid bruits. Cardiac: *** RRR. *** No murmur. No rubs or gallops.   Respiratory:  Respirations regular and unlabored. Clear to auscultation without rales, wheezing or rhonchi. GI: Soft, nontender, nondistended. Extremities: Radials/DP/PT 2+ and equal bilaterally. No clubbing or cyanosis. No edema ***  Skin: Warm and dry, no rash. Neuro: Strength intact.  Assessment & Plan   ***  Disposition: ***     {Are you ordering a CV Procedure (e.g. stress test, cath, DCCV, TEE, etc)?   Press F2        :[id number]}   Signed, Barnie HERO. Shepherd Finnan, DNP, NP-C

## 2023-10-05 ENCOUNTER — Encounter: Payer: Self-pay | Admitting: *Deleted

## 2023-10-06 ENCOUNTER — Ambulatory Visit: Attending: Student | Admitting: Student

## 2023-10-06 ENCOUNTER — Encounter: Payer: Self-pay | Admitting: Student

## 2023-10-06 VITALS — BP 100/62 | HR 90 | Ht 72.0 in | Wt 160.0 lb

## 2023-10-06 DIAGNOSIS — F17200 Nicotine dependence, unspecified, uncomplicated: Secondary | ICD-10-CM | POA: Diagnosis not present

## 2023-10-06 DIAGNOSIS — I3139 Other pericardial effusion (noninflammatory): Secondary | ICD-10-CM | POA: Diagnosis not present

## 2023-10-06 DIAGNOSIS — E440 Moderate protein-calorie malnutrition: Secondary | ICD-10-CM | POA: Diagnosis not present

## 2023-10-06 DIAGNOSIS — F17201 Nicotine dependence, unspecified, in remission: Secondary | ICD-10-CM | POA: Diagnosis not present

## 2023-10-06 DIAGNOSIS — S51011A Laceration without foreign body of right elbow, initial encounter: Secondary | ICD-10-CM | POA: Diagnosis not present

## 2023-10-06 DIAGNOSIS — F1010 Alcohol abuse, uncomplicated: Secondary | ICD-10-CM

## 2023-10-06 DIAGNOSIS — I10 Essential (primary) hypertension: Secondary | ICD-10-CM

## 2023-10-06 DIAGNOSIS — I480 Paroxysmal atrial fibrillation: Secondary | ICD-10-CM | POA: Diagnosis not present

## 2023-10-06 NOTE — Patient Instructions (Signed)
 Medication Instructions:  Your physician recommends that you continue on your current medications as directed. Please refer to the Current Medication list given to you today.   *If you need a refill on your cardiac medications before your next appointment, please call your pharmacy*  Lab Work: None ordered at this time  If you have labs (blood work) drawn today and your tests are completely normal, you will receive your results only by: MyChart Message (if you have MyChart) OR A paper copy in the mail If you have any lab test that is abnormal or we need to change your treatment, we will call you to review the results.  Testing/Procedures: Your physician has requested that you have an echocardiogram. Echocardiography is a painless test that uses sound waves to create images of your heart. It provides your doctor with information about the size and shape of your heart and how well your heart's chambers and valves are working.   You may receive an ultrasound enhancing agent through an IV if needed to better visualize your heart during the echo. This procedure takes approximately one hour.  There are no restrictions for this procedure.  This will take place at [address] [facility], [place] [address]  Please note: We ask at that you not bring children with you during ultrasound (echo/ vascular) testing. Due to room size and safety concerns, children are not allowed in the ultrasound rooms during exams. Our front office staff cannot provide observation of children in our lobby area while testing is being conducted. An adult accompanying a patient to their appointment will only be allowed in the ultrasound room at the discretion of the ultrasound technician under special circumstances. We apologize for any inconvenience.   Follow-Up: At [facility], you and your health needs are our priority.  As part of our continuing mission to provide you with exceptional heart  care, our providers are all part of one team.  This team includes your primary Cardiologist (physician) and Advanced Practice Providers or APPs (Physician Assistants and Nurse Practitioners) who all work together to provide you with the care you need, when you need it.  Your next appointment:   4 month(s)  Provider:   Lonni Hanson, MD or Barnie Hila, NP    We recommend signing up for the patient portal called MyChart.  Sign up information is provided on this After Visit Summary.  MyChart is used to connect with patients for Virtual Visits (Telemedicine).  Patients are able to view lab/test results, encounter notes, upcoming appointments, etc.  Non-urgent messages can be sent to your provider as well.   To learn more about what you can do with MyChart, go to [email or web address].   Other Instructions  Please call us  back in 2 weeks to have your ECHO scheduled.

## 2023-10-07 DIAGNOSIS — E119 Type 2 diabetes mellitus without complications: Secondary | ICD-10-CM | POA: Diagnosis not present

## 2023-10-07 DIAGNOSIS — E440 Moderate protein-calorie malnutrition: Secondary | ICD-10-CM | POA: Diagnosis not present

## 2023-10-07 DIAGNOSIS — N1831 Chronic kidney disease, stage 3a: Secondary | ICD-10-CM | POA: Diagnosis not present

## 2023-10-07 DIAGNOSIS — D649 Anemia, unspecified: Secondary | ICD-10-CM | POA: Diagnosis not present

## 2023-10-10 DIAGNOSIS — E440 Moderate protein-calorie malnutrition: Secondary | ICD-10-CM | POA: Diagnosis not present

## 2023-10-10 DIAGNOSIS — J9601 Acute respiratory failure with hypoxia: Secondary | ICD-10-CM | POA: Diagnosis not present

## 2023-10-10 DIAGNOSIS — I480 Paroxysmal atrial fibrillation: Secondary | ICD-10-CM | POA: Diagnosis not present

## 2023-10-10 DIAGNOSIS — I10 Essential (primary) hypertension: Secondary | ICD-10-CM | POA: Diagnosis not present

## 2023-10-10 DIAGNOSIS — M109 Gout, unspecified: Secondary | ICD-10-CM | POA: Diagnosis not present

## 2023-10-12 DIAGNOSIS — M109 Gout, unspecified: Secondary | ICD-10-CM | POA: Diagnosis not present

## 2023-10-12 DIAGNOSIS — I10 Essential (primary) hypertension: Secondary | ICD-10-CM | POA: Diagnosis not present

## 2023-10-12 DIAGNOSIS — E440 Moderate protein-calorie malnutrition: Secondary | ICD-10-CM | POA: Diagnosis not present

## 2023-10-12 DIAGNOSIS — J9601 Acute respiratory failure with hypoxia: Secondary | ICD-10-CM | POA: Diagnosis not present

## 2023-10-12 DIAGNOSIS — I480 Paroxysmal atrial fibrillation: Secondary | ICD-10-CM | POA: Diagnosis not present

## 2023-10-13 DIAGNOSIS — M109 Gout, unspecified: Secondary | ICD-10-CM | POA: Diagnosis not present

## 2023-10-13 DIAGNOSIS — E440 Moderate protein-calorie malnutrition: Secondary | ICD-10-CM | POA: Diagnosis not present

## 2023-10-13 DIAGNOSIS — I480 Paroxysmal atrial fibrillation: Secondary | ICD-10-CM | POA: Diagnosis not present

## 2023-10-13 DIAGNOSIS — I10 Essential (primary) hypertension: Secondary | ICD-10-CM | POA: Diagnosis not present

## 2023-10-13 DIAGNOSIS — J9601 Acute respiratory failure with hypoxia: Secondary | ICD-10-CM | POA: Diagnosis not present

## 2023-10-14 DIAGNOSIS — I10 Essential (primary) hypertension: Secondary | ICD-10-CM | POA: Diagnosis not present

## 2023-10-14 DIAGNOSIS — J90 Pleural effusion, not elsewhere classified: Secondary | ICD-10-CM | POA: Diagnosis not present

## 2023-10-14 DIAGNOSIS — R222 Localized swelling, mass and lump, trunk: Secondary | ICD-10-CM | POA: Diagnosis not present

## 2023-10-14 DIAGNOSIS — E871 Hypo-osmolality and hyponatremia: Secondary | ICD-10-CM | POA: Diagnosis not present

## 2023-10-14 DIAGNOSIS — D649 Anemia, unspecified: Secondary | ICD-10-CM | POA: Diagnosis not present

## 2023-10-14 DIAGNOSIS — Z1389 Encounter for screening for other disorder: Secondary | ICD-10-CM | POA: Diagnosis not present

## 2023-10-14 DIAGNOSIS — Z0131 Encounter for examination of blood pressure with abnormal findings: Secondary | ICD-10-CM | POA: Diagnosis not present

## 2023-10-14 DIAGNOSIS — I4891 Unspecified atrial fibrillation: Secondary | ICD-10-CM | POA: Diagnosis not present

## 2023-10-14 DIAGNOSIS — F17200 Nicotine dependence, unspecified, uncomplicated: Secondary | ICD-10-CM | POA: Diagnosis not present

## 2023-10-17 DIAGNOSIS — I480 Paroxysmal atrial fibrillation: Secondary | ICD-10-CM | POA: Diagnosis not present

## 2023-10-17 DIAGNOSIS — M109 Gout, unspecified: Secondary | ICD-10-CM | POA: Diagnosis not present

## 2023-10-17 DIAGNOSIS — I10 Essential (primary) hypertension: Secondary | ICD-10-CM | POA: Diagnosis not present

## 2023-10-17 DIAGNOSIS — E440 Moderate protein-calorie malnutrition: Secondary | ICD-10-CM | POA: Diagnosis not present

## 2023-10-17 DIAGNOSIS — J9601 Acute respiratory failure with hypoxia: Secondary | ICD-10-CM | POA: Diagnosis not present

## 2023-10-18 ENCOUNTER — Telehealth: Payer: Self-pay | Admitting: Student

## 2023-10-18 NOTE — Telephone Encounter (Signed)
 Unable to leave voicemail, called to schedule echocardiogram.

## 2023-10-18 NOTE — Telephone Encounter (Signed)
-----   Message from Nurse Kaushal P sent at 10/18/2023 11:52 AM EDT ----- Regarding: Schedule ECHO Can we please call this patient have him schedule an ECHO.  Thank you.

## 2023-10-20 DIAGNOSIS — I480 Paroxysmal atrial fibrillation: Secondary | ICD-10-CM | POA: Diagnosis not present

## 2023-10-20 DIAGNOSIS — J9601 Acute respiratory failure with hypoxia: Secondary | ICD-10-CM | POA: Diagnosis not present

## 2023-10-20 DIAGNOSIS — M109 Gout, unspecified: Secondary | ICD-10-CM | POA: Diagnosis not present

## 2023-10-20 DIAGNOSIS — E440 Moderate protein-calorie malnutrition: Secondary | ICD-10-CM | POA: Diagnosis not present

## 2023-10-20 DIAGNOSIS — I10 Essential (primary) hypertension: Secondary | ICD-10-CM | POA: Diagnosis not present

## 2023-10-20 NOTE — Telephone Encounter (Signed)
 No VM

## 2023-10-21 NOTE — Telephone Encounter (Signed)
 Attempted to schedule echo appt, unable to leave voicemail

## 2023-10-26 DIAGNOSIS — E440 Moderate protein-calorie malnutrition: Secondary | ICD-10-CM | POA: Diagnosis not present

## 2023-10-26 DIAGNOSIS — I480 Paroxysmal atrial fibrillation: Secondary | ICD-10-CM | POA: Diagnosis not present

## 2023-10-26 DIAGNOSIS — I10 Essential (primary) hypertension: Secondary | ICD-10-CM | POA: Diagnosis not present

## 2023-10-26 DIAGNOSIS — M109 Gout, unspecified: Secondary | ICD-10-CM | POA: Diagnosis not present

## 2023-10-26 DIAGNOSIS — J9601 Acute respiratory failure with hypoxia: Secondary | ICD-10-CM | POA: Diagnosis not present

## 2023-10-27 NOTE — Telephone Encounter (Signed)
 We have tried to call this pt many time and II tried again today, but could not leave a message

## 2023-10-28 ENCOUNTER — Ambulatory Visit: Admission: RE | Admit: 2023-10-28 | Source: Ambulatory Visit

## 2023-10-28 ENCOUNTER — Ambulatory Visit: Attending: Student in an Organized Health Care Education/Training Program

## 2023-11-01 DIAGNOSIS — I10 Essential (primary) hypertension: Secondary | ICD-10-CM | POA: Diagnosis not present

## 2023-11-01 DIAGNOSIS — I480 Paroxysmal atrial fibrillation: Secondary | ICD-10-CM | POA: Diagnosis not present

## 2023-11-01 DIAGNOSIS — E440 Moderate protein-calorie malnutrition: Secondary | ICD-10-CM | POA: Diagnosis not present

## 2023-11-01 DIAGNOSIS — M109 Gout, unspecified: Secondary | ICD-10-CM | POA: Diagnosis not present

## 2023-11-02 ENCOUNTER — Ambulatory Visit: Admitting: Student in an Organized Health Care Education/Training Program

## 2023-12-01 ENCOUNTER — Telehealth: Payer: Self-pay | Admitting: Student

## 2023-12-01 DIAGNOSIS — D649 Anemia, unspecified: Secondary | ICD-10-CM | POA: Diagnosis not present

## 2023-12-01 DIAGNOSIS — E871 Hypo-osmolality and hyponatremia: Secondary | ICD-10-CM | POA: Diagnosis not present

## 2023-12-01 DIAGNOSIS — Z1389 Encounter for screening for other disorder: Secondary | ICD-10-CM | POA: Diagnosis not present

## 2023-12-01 DIAGNOSIS — I10 Essential (primary) hypertension: Secondary | ICD-10-CM | POA: Diagnosis not present

## 2023-12-01 DIAGNOSIS — Z0131 Encounter for examination of blood pressure with abnormal findings: Secondary | ICD-10-CM | POA: Diagnosis not present

## 2023-12-01 DIAGNOSIS — J90 Pleural effusion, not elsewhere classified: Secondary | ICD-10-CM | POA: Diagnosis not present

## 2023-12-01 DIAGNOSIS — I3139 Other pericardial effusion (noninflammatory): Secondary | ICD-10-CM | POA: Diagnosis not present

## 2023-12-01 DIAGNOSIS — I4891 Unspecified atrial fibrillation: Secondary | ICD-10-CM | POA: Diagnosis not present

## 2023-12-01 NOTE — Telephone Encounter (Signed)
 Pt c/o medication issue:  1. Name of Medication:   colchicine  0.6 MG tablet    2. How are you currently taking this medication (dosage and times per day)?   3. Are you having a reaction (difficulty breathing--STAT)?   4. What is your medication issue?   Inocente with PCP office says their provider would like to know if patient is supposed to be taking Colchicine . They saw patient in office today and she advised that she hasn't been taking it. Please advise.  Phone#: [phone] (ext#: [phone])

## 2023-12-02 NOTE — Telephone Encounter (Signed)
 Reviewed with patient that I have called the [facility] and was unable to reach anyone nor leave a message.   Reviewed provider recommendations to have repeat echocardiogram and he does not want to have one at this time. Instructed him to call back if he should want to have that done.   He verbalized understanding with no further questions at this time.        ----- Message -----  From: Loistine Sober, NP  Sent: 12/01/2023   2:29 PM EDT      I would like to see a repeat echo. Can you see about getting patient scheduled for that?    Thank you!    DW

## 2023-12-02 NOTE — Telephone Encounter (Signed)
 No answer. No voicemail.

## 2024-02-16 NOTE — Progress Notes (Deleted)
 Cardiology Clinic Note   Date: 02/16/2024 ID: Barry Fowler Fowler February 15, 1956, MRN [id number]  Primary Cardiologist:  Barry Fowler Hanson, MD  Chief Complaint   Barry Fowler Fowler is a 68 y.o. male who presents to the clinic today for ***  Patient Profile   Barry Fowler Fowler is followed by Dr. Hanson for the history outlined below.       Past medical history significant for: Pericardial effusion. Echo 09/12/2023: EF 55 to 60%.  Regional wall motion unable to be evaluated.  Normal diastolic parameters.  Low normal RV function.  Normal RV size.  Mildly increased right ventricular wall thickness.  Mildly elevated PA pressure, RVSP 35.6 mmHg.  Mild RAE.  Moderate circumferential pericardial effusion with no evidence of cardiac tamponade.  Mild MR.  Aortic valve sclerosis without stenosis.  Dilated IVC, RA pressure 15 mmHg. Limited echo 09/20/2023: EF 60 to 65%.  Mild LVH.  Low normal RV function, normal RV size.  Normal PA pressure.  Mild RAE.  Moderate pericardial effusion posterior to the left ventricle and lateral to the left ventricle without tamponade.  Dilated IVC, RA pressure 8 mmHg.  Pericardial effusion is similar in size but less circumferential and more localized to posterior lateral left ventricle. PAF. Onset June 2025. Hypertension. Alcohol use. Tobacco abuse.  In summary, patient presented to the ED via EMS on 09/05/2023 after being found on the floor by his sister.  Patient had evidence of bruises including an older hematoma of the right eye from a fall several days prior.  BP upon EMS arrival 80/40.  He received a 500 mL normal saline bolus prior to arrival.  At the time of triage BP 128/78.  Patient sister reported he is an alcoholic and was on an alcohol binge.  Patient denied self-harm.  Initial labs: WBC 13.9, hemoglobin 12.1, sodium 104, potassium 3.7, creatinine 0.51, BUN 9, calcium  7.5, AST 123, ALT 42, magnesium  1.8, CK 3815, negative drug screen.  Patient was admitted to the ICU and sodium was  slowly corrected.  Patient went into A-fib on 09/10/2023 and cardiology was consulted.  Patient was asymptomatic.  Oral metoprolol  was added for rate control.  It was felt he was not a good candidate for amiodarone or oral anticoagulation.  He was started on IV heparin .  TSH and electrolytes are stable.  Patient converted to sinus rhythm in the early morning hours on 6/16.  Echo demonstrated normal LV function with moderate circumferential pericardial effusion (detailed above).  He was provided with a one-time dose of IV Lasix .  And follow up limited echo recommended in 48 hours.  Patient was discharged on 09/13/2023 to SNF.  Patient returned to the ED on 09/14/2023 from SNF secondary to sudden onset of shortness of breath with SpO2 in the 80s on room air.  Facility performed x-ray which was read as CHF, bilateral pleural effusions, bibasilar atelectasis.  Initial labs: WBC 12.1, hemoglobin 10.9, hematocrit 31.5, sodium 126, potassium 4, creatinine 0.62, BUN 9, BNP 412, respiratory panel negative.  EKG showed sinus rhythm.  Chest x-ray demonstrated increased interstitial markings in the lungs bilaterally favoring mild multifocal infection over interstitial edema, small bilateral pleural effusions.  Patient underwent right thoracentesis on 09/19/2023 and left thoracentesis on 09/21/2023.  CT chest demonstrated large pericardial effusion with pericardial thickening.  Limited echo demonstrated moderate pericardial effusion less circumferential and more localized to posterior lateral left ventricle as detailed above.  Pericardiocentesis was not indicated.  Recommendation for gentle diuresis.  Sed rate and CRP were elevated  and patient was started on colchicine .  Patient was discharged on 09/23/2023.   Patient was last seen in the office by me on 10/06/2023 for hospital follow-up.  He reported shortness of breath had resolved since hospital discharge.  He remained residing in a skilled nursing facility and was working with  physical therapy.  He reported plan was for him to be discharged home in 2 days.  He reported he lives alone and does not drive.  He has 3 sisters who help him and get him to appointments.     History of Present Illness    Today, patient ***  ***  ROS: All other systems reviewed and are otherwise negative except as noted in History of Present Illness.  EKGs/Labs Reviewed        09/05/2023: ALT 42; AST 123 09/23/2023: BUN 12; Creatinine, Ser 0.47; Potassium 4.3; Sodium 129   09/19/2023: Hemoglobin 9.5; WBC 9.0   09/10/2023: TSH 1.259   09/19/2023: B Natriuretic Peptide 202.9  ***  Risk Assessment/Calculations    {Does this patient have ATRIAL FIBRILLATION?[phone]} No BP recorded.  {Refresh Note OR Click here to enter BP  :1}***        Physical Exam    VS:  There were no vitals taken for this visit. , BMI There is no height or weight on file to calculate BMI.  GEN: Well nourished, well developed, in no acute distress. Neck: No JVD or carotid bruits. Cardiac: *** RRR. *** No murmur. No rubs or gallops.   Respiratory:  Respirations regular and unlabored. Clear to auscultation without rales, wheezing or rhonchi. GI: Soft, nontender, nondistended. Extremities: Radials/DP/PT 2+ and equal bilaterally. No clubbing or cyanosis. No edema ***  Skin: Warm and dry, no rash. Neuro: Strength intact.  Assessment & Plan   ***  Disposition: ***     {Are you ordering a CV Procedure (e.g. stress test, cath, DCCV, TEE, etc)?   Press F2        :[id number]}   Signed, Barry Fowler Fowler. Barry Fowler Stemmer, DNP, NP-C

## 2024-02-17 ENCOUNTER — Telehealth: Payer: Self-pay | Admitting: Emergency Medicine

## 2024-02-17 NOTE — Telephone Encounter (Signed)
 Call to pt and left message to change appt until AFTER echo

## 2024-02-20 ENCOUNTER — Ambulatory Visit: Admitting: Student

## 2024-03-16 NOTE — Progress Notes (Signed)
 "  Cardiology Clinic Note   Date: 03/26/2024 ID: Barry Fowler 21-Aug-1955, MRN [id number]  Primary Cardiologist:  Lonni Hanson, MD  Chief Complaint   Barry Fowler is a 68 y.o. male who presents to the clinic today for routine follow up.   Patient Profile   Barry Fowler is followed by Dr. Hanson for the history outlined below.      Past medical history significant for: Pericardial effusion. Echo 09/12/2023: EF 55 to 60%.  Regional wall motion unable to be evaluated.  Normal diastolic parameters.  Low normal RV function.  Normal RV size.  Mildly increased right ventricular wall thickness.  Mildly elevated PA pressure, RVSP 35.6 mmHg.  Mild RAE.  Moderate circumferential pericardial effusion with no evidence of cardiac tamponade.  Mild MR.  Aortic valve sclerosis without stenosis.  Dilated IVC, RA pressure 15 mmHg. Limited echo 09/20/2023: EF 60 to 65%.  Mild LVH.  Low normal RV function, normal RV size.  Normal PA pressure.  Mild RAE.  Moderate pericardial effusion posterior to the left ventricle and lateral to the left ventricle without tamponade.  Dilated IVC, RA pressure 8 mmHg.  Pericardial effusion is similar in size but less circumferential and more localized to posterior lateral left ventricle. PAF. Onset June 2025. Hypertension. Alcohol use. Tobacco abuse.  In summary, patient presented to the ED via EMS on 09/05/2023 after being found on the floor by his sister.  Patient had evidence of bruises including an older hematoma of the right eye from a fall several days prior.  BP upon EMS arrival 80/40.  He received a 500 mL normal saline bolus prior to arrival.  At the time of triage BP 128/78.  Patient sister reported he is an alcoholic and was on an alcohol binge.  Patient denied self-harm.  Initial labs: WBC 13.9, hemoglobin 12.1, sodium 104, potassium 3.7, creatinine 0.51, BUN 9, calcium  7.5, AST 123, ALT 42, magnesium  1.8, CK 3815, negative drug screen.  Patient was admitted to the ICU  and sodium was slowly corrected.  Patient went into A-fib on 09/10/2023 and cardiology was consulted.  Patient was asymptomatic.  Oral metoprolol  was added for rate control.  It was felt he was not a good candidate for amiodarone or oral anticoagulation.  He was started on IV heparin .  TSH and electrolytes are stable.  Patient converted to sinus rhythm in the early morning hours on 6/16.  Echo demonstrated normal LV function with moderate circumferential pericardial effusion (detailed above).  He was provided with a one-time dose of IV Lasix .  And follow up limited echo recommended in 48 hours.  Patient was discharged on 09/13/2023 to SNF.  Patient returned to the ED on 09/14/2023 from SNF secondary to sudden onset of shortness of breath with SpO2 in the 80s on room air.  Facility performed x-ray which was read as CHF, bilateral pleural effusions, bibasilar atelectasis.  Initial labs: WBC 12.1, hemoglobin 10.9, hematocrit 31.5, sodium 126, potassium 4, creatinine 0.62, BUN 9, BNP 412, respiratory panel negative.  EKG showed sinus rhythm.  Chest x-ray demonstrated increased interstitial markings in the lungs bilaterally favoring mild multifocal infection over interstitial edema, small bilateral pleural effusions.  Patient underwent right thoracentesis on 09/19/2023 and left thoracentesis on 09/21/2023.  CT chest demonstrated large pericardial effusion with pericardial thickening.  Limited echo demonstrated moderate pericardial effusion less circumferential and more localized to posterior lateral left ventricle as detailed above.  Pericardiocentesis was not indicated.  Recommendation for gentle diuresis.  Sed rate and  CRP were elevated and patient was started on colchicine .  Patient was discharged on 09/23/2023.   Patient was last seen in the office by me on 10/06/2023 for hospital follow-up.  He reported shortness of breath had resolved since hospital discharge.  He remained residing in a skilled nursing facility and  was working with physical therapy.  He reported plan was for him to be discharged home in 2 days.  He reported he lives alone and does not drive.  He has 3 sisters who help him and get him to appointments.      History of Present Illness    Today, patient is here alone. He reports he is doing well.  No chest pain, pressure, or tightness. No palpitations.  He reports getting short of breath with heavier exertion but not with routine activities. He denies lower extremity edema, orthopnea or PND. He has not needed Lasix  since returning home from the nursing home. He notes gradual weight gain since July related to eating more since being home. He does not have a scale to weigh at home. He has returned to smoking and is averaging 3-4 cigarettes a day. He has not had any alcohol since hospital admission. He does not think he is taking Losartan although he believes he was discharged from SNF on it. He does not have a BP cuff at home. He lives alone with his sisters close by to help him and assist with transporting to doctor visits. He is independent with ADLs. He cooks for himself and does light to moderate household chores. He has not had his repeat echo yet.     ROS: All other systems reviewed and are otherwise negative except as noted in History of Present Illness.  EKGs/Labs Reviewed    EKG Interpretation Date/Time:  Monday March 26 2024 14:34:18 EST Ventricular Rate:  69 PR Interval:  184 QRS Duration:  98 QT Interval:  382 QTC Calculation: 409 R Axis:   -1  Text Interpretation: Normal sinus rhythm Incomplete right bundle branch block When compared with ECG of 06-Oct-2023 13:59, No significant change was found Confirmed by Loistine Sober [phone]) on 03/26/2024 2:37:34 PM   09/05/2023: ALT 42; AST 123 09/23/2023: BUN 12; Creatinine, Ser 0.47; Potassium 4.3; Sodium 129   09/19/2023: Hemoglobin 9.5; WBC 9.0   09/10/2023: TSH 1.259   09/19/2023: B Natriuretic Peptide 202.9    Risk  Assessment/Calculations      HYPERTENSION CONTROL Vitals:   03/26/24 1427 03/26/24 1620  BP: (!) 172/98 (!) 144/88    The patient's blood pressure is elevated above target today.  In order to address the patient's elevated BP: A new medication was prescribed today.           Physical Exam    VS:  BP (!) 144/88 (BP Location: Left Arm, Patient Position: Sitting, Cuff Size: Normal)   Pulse 69   Ht 6' (1.829 m)   Wt 200 lb (90.7 kg)   SpO2 91%   BMI 27.12 kg/m  , BMI Body mass index is 27.12 kg/m.  GEN: Well nourished, well developed, in no acute distress. Neck: No JVD or carotid bruits. Cardiac:  RRR.  No murmur. No rubs or gallops.   Respiratory:  Respirations regular and unlabored. Clear to auscultation without rales, wheezing or rhonchi. GI: Soft, nontender, nondistended. Extremities: Radials/DP/PT 2+ and equal bilaterally. No clubbing or cyanosis. No edema  Skin: Warm and dry, no rash. Neuro: Strength intact.  Assessment & Plan   Pericardial effusion Echo  09/12/2023 showed moderate circumferential pericardial effusion with no evidence of cardiac tamponade.  Repeat limited echo 09/20/2023 showed moderate pericardial effusion in the left ventricle and lateral to the left ventricle without tamponade felt to be similar in size but less circumferential more localized to posterior lateral left ventricle.  Patient denies chest pain, pressure or tightness. No lower extremity edema, abdominal tightness, orthopnea or PND. He is able to lay flat without difficulty. He reports some dyspnea with heavier exertion but not with routine activities. He has not been taking Lasix . He reports gradual weight gain since coming home from SNF related to eating more than prior to hospital admission. Euvolemic and well compensated on exam. He has not had repeat echo. Discussed the importance of keeping upcoming visit.  - Scheduled for repeat echo 12/31.  - Continue colchicine .   PAF Onset June 2025  in the setting of alcohol binge.  Not a candidate for oral anticoagulation secondary to alcohol abuse and frequent falls.  Patient denies palpitations. EKG demonstrates NSR 69 bpm.  - Continue metoprolol . - Consider Zio at follow up.    Hypertension BP today 172/98 on intake and 144/88 on my recheck. No dizziness or presyncope. He does not think he is taking Losartan but believes it was restarted when he was discharged from SNF. He is not sure why he stopped it.  - Restart Losartan 25 mg daily.   - Continue metoprolol . - Rx BP cuff.    Tobacco abuse Patient reports he has returned to smoking averaging 3-4 cigarettes a day.  - Encouraged complete cessation.  - Refer to pulmonology.    Alcohol abuse Patient has not had any alcohol since hospital admission. He understands the dangers of consuming alcohol. - Continued abstinence is encouraged.   Disposition: CBC and BMP today. Refer to pulmonology. Restart losartan 25 mg daily. Rx BP cuff. Return in 3 months or sooner as needed.          Signed, Barnie HERO. Charina Fons, DNP, NP-C  "

## 2024-03-19 ENCOUNTER — Ambulatory Visit

## 2024-03-26 ENCOUNTER — Encounter: Payer: Self-pay | Admitting: Student

## 2024-03-26 ENCOUNTER — Ambulatory Visit: Attending: Student | Admitting: Student

## 2024-03-26 VITALS — BP 144/88 | HR 69 | Ht 72.0 in | Wt 200.0 lb

## 2024-03-26 DIAGNOSIS — I480 Paroxysmal atrial fibrillation: Secondary | ICD-10-CM

## 2024-03-26 DIAGNOSIS — Z720 Tobacco use: Secondary | ICD-10-CM | POA: Diagnosis not present

## 2024-03-26 DIAGNOSIS — Z79899 Other long term (current) drug therapy: Secondary | ICD-10-CM | POA: Diagnosis not present

## 2024-03-26 DIAGNOSIS — I10 Essential (primary) hypertension: Secondary | ICD-10-CM | POA: Diagnosis not present

## 2024-03-26 DIAGNOSIS — R0609 Other forms of dyspnea: Secondary | ICD-10-CM | POA: Diagnosis not present

## 2024-03-26 DIAGNOSIS — F1011 Alcohol abuse, in remission: Secondary | ICD-10-CM

## 2024-03-26 DIAGNOSIS — I3139 Other pericardial effusion (noninflammatory): Secondary | ICD-10-CM | POA: Diagnosis not present

## 2024-03-26 MED ORDER — LOSARTAN POTASSIUM 25 MG PO TABS: 25.0000 mg | ORAL_TABLET | Freq: Every day | ORAL | 3 refills | Status: AC

## 2024-03-26 MED ORDER — COMFORT TOUCH BP CUFF/LARGE MISC: 1.0000 [IU] | Freq: Once | 0 refills | Status: AC

## 2024-03-26 NOTE — Patient Instructions (Signed)
 Medication Instructions:   Your physician recommends the following medication changes.  START TAKING: Losartan 25 mg once daily Use BP Cuff   *If you need a refill on your cardiac medications before your next appointment, please call your pharmacy*  Lab Work:  Your provider would like for you to have following labs drawn today BMet, CBC.    If you have labs (blood work) drawn today and your tests are completely normal, you will receive your results only by:  MyChart Message (if you have MyChart) OR  A paper copy in the mail If you have any lab test that is abnormal or we need to change your treatment, we will call you to review the results.  Testing/Procedures:  Please keep your upcoming appointment for the Echocardiogram on 03/28/24.  Referrals:  Your cardiologist has referred you to Pulmonary.  We have attached their office location and phone number below.  Please allow them 3-5 business days to reach out to you to make an appointment.  If you have not heard from their office within that time, please call them to schedule your appointment.    Follow-Up:  At [facility], you and your health needs are our priority.  As part of our continuing mission to provide you with exceptional heart care, our providers are all part of one team.  This team includes your primary Cardiologist (physician) and Advanced Practice Providers or APPs (Physician Assistants and Nurse Practitioners) who all work together to provide you with the care you need, when you need it.  Your next appointment:   3 month(s)  Provider:    Lonni Hanson, MD    We recommend signing up for the patient portal called MyChart.  Sign up information is provided on this After Visit Summary.  MyChart is used to connect with patients for Virtual Visits (Telemedicine).  Patients are able to view lab/test results, encounter notes, upcoming appointments, etc.  Non-urgent messages can be sent to your provider as  well.   To learn more about what you can do with MyChart, go to [email or web address].   Other Instructions  Avoid alcohol, caffeine, and exercise within 30 minutes before checking blood pressure. Sit still, with feet flat on the floor, in a straight backed, supportive chair.  Rest for 5 minutes.  Support arm on flat surface at heart level. Bottom of cuff should be placed directly above the bend of the elbow. Take multiple readings.  Write down and bring to all follow up appointments.  Bring cuff to clinic periodically for accuracy comparison.

## 2024-03-27 ENCOUNTER — Ambulatory Visit: Payer: Self-pay | Admitting: Student

## 2024-03-27 LAB — BASIC METABOLIC PANEL WITH GFR
BUN/Creatinine Ratio: 12 (ref 10–24)
BUN: 10 mg/dL (ref 8–27)
CO2: 22 mmol/L (ref 20–29)
Calcium: 10.3 mg/dL — ABNORMAL HIGH (ref 8.6–10.2)
Chloride: 100 mmol/L (ref 96–106)
Creatinine, Ser: 0.85 mg/dL (ref 0.76–1.27)
Glucose: 98 mg/dL (ref 70–99)
Potassium: 5 mmol/L (ref 3.5–5.2)
Sodium: 140 mmol/L (ref 134–144)
eGFR: 95 mL/min/1.73

## 2024-03-27 LAB — CBC
Hematocrit: 42.9 % (ref 37.5–51.0)
Hemoglobin: 13.9 g/dL (ref 13.0–17.7)
MCH: 27.9 pg (ref 26.6–33.0)
MCHC: 32.4 g/dL (ref 31.5–35.7)
MCV: 86 fL (ref 79–97)
Platelets: 275 x10E3/uL (ref 150–450)
RBC: 4.98 x10E6/uL (ref 4.14–5.80)
RDW: 13.6 % (ref 11.6–15.4)
WBC: 9.8 x10E3/uL (ref 3.4–10.8)

## 2024-03-28 ENCOUNTER — Ambulatory Visit: Attending: Student

## 2024-03-28 DIAGNOSIS — I3139 Other pericardial effusion (noninflammatory): Secondary | ICD-10-CM

## 2024-03-28 LAB — ECHOCARDIOGRAM LIMITED: Area-P 1/2: 3.27 cm2

## 2024-03-30 ENCOUNTER — Ambulatory Visit: Payer: Self-pay | Admitting: Student

## 2024-04-02 NOTE — Telephone Encounter (Signed)
 PT returning call to nurse

## 2024-04-04 ENCOUNTER — Encounter: Payer: Self-pay | Admitting: Emergency Medicine

## 2024-04-04 NOTE — Progress Notes (Signed)
"  Letter Sent   "

## 2024-05-28 ENCOUNTER — Encounter: Admitting: Pulmonary Disease

## 2024-06-20 ENCOUNTER — Ambulatory Visit: Admitting: Internal Medicine
